# Patient Record
Sex: Female | Born: 1950 | Race: Black or African American | Hispanic: No | Marital: Single | Smoking: Former smoker
Health system: Southern US, Community
[De-identification: ages and names within clinical notes are randomized; demographics above are authoritative.]

## PROBLEM LIST (undated history)

## (undated) DIAGNOSIS — I10 Essential (primary) hypertension: Secondary | ICD-10-CM

## (undated) DIAGNOSIS — J9621 Acute and chronic respiratory failure with hypoxia: Secondary | ICD-10-CM

## (undated) DIAGNOSIS — J690 Pneumonitis due to inhalation of food and vomit: Secondary | ICD-10-CM

## (undated) DIAGNOSIS — G931 Anoxic brain damage, not elsewhere classified: Secondary | ICD-10-CM

## (undated) DIAGNOSIS — I639 Cerebral infarction, unspecified: Secondary | ICD-10-CM

## (undated) DIAGNOSIS — G40301 Generalized idiopathic epilepsy and epileptic syndromes, not intractable, with status epilepticus: Secondary | ICD-10-CM

## (undated) DIAGNOSIS — E7800 Pure hypercholesterolemia, unspecified: Secondary | ICD-10-CM

## (undated) DIAGNOSIS — G40911 Epilepsy, unspecified, intractable, with status epilepticus: Secondary | ICD-10-CM

---

## 2013-09-28 ENCOUNTER — Inpatient Hospital Stay (HOSPITAL_COMMUNITY): Payer: BC Managed Care – PPO

## 2013-09-28 ENCOUNTER — Encounter (HOSPITAL_COMMUNITY): Payer: Self-pay | Admitting: Emergency Medicine

## 2013-09-28 ENCOUNTER — Emergency Department (HOSPITAL_COMMUNITY): Payer: BC Managed Care – PPO

## 2013-09-28 ENCOUNTER — Inpatient Hospital Stay (HOSPITAL_COMMUNITY)
Admission: EM | Admit: 2013-09-28 | Discharge: 2013-10-03 | DRG: 062 | Disposition: A | Discharge status: Rehabilitation Facility | Payer: BC Managed Care – PPO | Attending: Neurology | Admitting: Neurology

## 2013-09-28 DIAGNOSIS — I10 Essential (primary) hypertension: Secondary | ICD-10-CM | POA: Diagnosis present

## 2013-09-28 DIAGNOSIS — F17290 Nicotine dependence, other tobacco product, uncomplicated: Secondary | ICD-10-CM | POA: Diagnosis present

## 2013-09-28 DIAGNOSIS — Z79899 Other long term (current) drug therapy: Secondary | ICD-10-CM

## 2013-09-28 DIAGNOSIS — G8190 Hemiplegia, unspecified affecting unspecified side: Secondary | ICD-10-CM | POA: Diagnosis present

## 2013-09-28 DIAGNOSIS — I639 Cerebral infarction, unspecified: Secondary | ICD-10-CM | POA: Diagnosis present

## 2013-09-28 DIAGNOSIS — Z01818 Encounter for other preprocedural examination: Secondary | ICD-10-CM

## 2013-09-28 DIAGNOSIS — B9689 Other specified bacterial agents as the cause of diseases classified elsewhere: Secondary | ICD-10-CM | POA: Diagnosis present

## 2013-09-28 DIAGNOSIS — R471 Dysarthria and anarthria: Secondary | ICD-10-CM | POA: Diagnosis present

## 2013-09-28 DIAGNOSIS — I6350 Cerebral infarction due to unspecified occlusion or stenosis of unspecified cerebral artery: Secondary | ICD-10-CM

## 2013-09-28 DIAGNOSIS — Z7982 Long term (current) use of aspirin: Secondary | ICD-10-CM

## 2013-09-28 DIAGNOSIS — I6340 Cerebral infarction due to embolism of unspecified cerebral artery: Principal | ICD-10-CM | POA: Diagnosis present

## 2013-09-28 DIAGNOSIS — R29810 Facial weakness: Secondary | ICD-10-CM | POA: Diagnosis present

## 2013-09-28 DIAGNOSIS — N390 Urinary tract infection, site not specified: Secondary | ICD-10-CM | POA: Diagnosis present

## 2013-09-28 DIAGNOSIS — E785 Hyperlipidemia, unspecified: Secondary | ICD-10-CM | POA: Diagnosis present

## 2013-09-28 DIAGNOSIS — G8194 Hemiplegia, unspecified affecting left nondominant side: Secondary | ICD-10-CM | POA: Diagnosis present

## 2013-09-28 DIAGNOSIS — F17200 Nicotine dependence, unspecified, uncomplicated: Secondary | ICD-10-CM | POA: Diagnosis present

## 2013-09-28 LAB — URINALYSIS, ROUTINE W REFLEX MICROSCOPIC
Bilirubin Urine: NEGATIVE
Glucose, UA: NEGATIVE mg/dL
Ketones, ur: NEGATIVE mg/dL
Nitrite: POSITIVE — AB
Specific Gravity, Urine: 1.016 (ref 1.005–1.030)
pH: 7 (ref 5.0–8.0)

## 2013-09-28 LAB — COMPREHENSIVE METABOLIC PANEL
ALT: 9 U/L (ref 0–35)
AST: 18 U/L (ref 0–37)
Alkaline Phosphatase: 90 U/L (ref 39–117)
BUN: 12 mg/dL (ref 6–23)
CO2: 26 mEq/L (ref 19–32)
Calcium: 9.9 mg/dL (ref 8.4–10.5)
Chloride: 97 mEq/L (ref 96–112)
GFR calc Af Amer: >90 mL/min (ref >90)
GFR calc non Af Amer: >90 mL/min (ref >90)
Glucose, Bld: 124 mg/dL — ABNORMAL HIGH (ref 70–99)
Potassium: 4.3 mEq/L (ref 3.5–5.1)
Sodium: 137 mEq/L (ref 135–145)
Total Bilirubin: 0.2 mg/dL — ABNORMAL LOW (ref 0.3–1.2)
Total Protein: 8 g/dL (ref 6.0–8.3)

## 2013-09-28 LAB — MRSA PCR SCREENING: MRSA by PCR: NEGATIVE

## 2013-09-28 LAB — DIFFERENTIAL
Eosinophils Absolute: 0 10*3/uL (ref 0.0–0.7)
Eosinophils Relative: 1 % (ref 0–5)
Lymphocytes Relative: 28 % (ref 12–46)
Lymphs Abs: 1.6 10*3/uL (ref 0.7–4.0)
Monocytes Relative: 8 % (ref 3–12)
Neutro Abs: 3.8 10*3/uL (ref 1.7–7.7)
Neutrophils Relative %: 64 % (ref 43–77)

## 2013-09-28 LAB — CBC
Hemoglobin: 14.5 g/dL (ref 12.0–15.0)
MCH: 30.1 pg (ref 26.0–34.0)
MCV: 89.4 fL (ref 78.0–100.0)
Platelets: 207 10*3/uL (ref 150–400)
RBC: 4.82 MIL/uL (ref 3.87–5.11)
WBC: 6 10*3/uL (ref 4.0–10.5)

## 2013-09-28 LAB — POCT I-STAT TROPONIN I

## 2013-09-28 LAB — POCT I-STAT, CHEM 8
BUN: 13 mg/dL (ref 6–23)
Calcium, Ion: 1.23 mmol/L (ref 1.13–1.30)
Creatinine, Ser: 1 mg/dL (ref 0.50–1.10)
Hemoglobin: 14.6 g/dL (ref 12.0–15.0)
Sodium: 141 mEq/L (ref 135–145)
TCO2: 29 mmol/L (ref 0–100)

## 2013-09-28 LAB — PROTIME-INR: INR: 0.95 (ref 0.00–1.49)

## 2013-09-28 LAB — RAPID URINE DRUG SCREEN, HOSP PERFORMED
Barbiturates: NOT DETECTED
Cocaine: NOT DETECTED

## 2013-09-28 LAB — URINE MICROSCOPIC-ADD ON

## 2013-09-28 LAB — TROPONIN I: Troponin I: <0.3 ng/mL (ref <0.30)

## 2013-09-28 LAB — ETHANOL: Alcohol, Ethyl (B): <11 mg/dL (ref 0–11)

## 2013-09-28 MED ORDER — ALTEPLASE (STROKE) FULL DOSE INFUSION
52.0000 mg | Freq: Once | INTRAVENOUS | Status: AC
  Administered 2013-09-28: 52 mg via INTRAVENOUS
  Filled 2013-09-28: qty 52

## 2013-09-28 MED ORDER — LABETALOL HCL 5 MG/ML IV SOLN
INTRAVENOUS | Status: AC
  Administered 2013-09-28: 10 mg
  Filled 2013-09-28: qty 4

## 2013-09-28 MED ORDER — ACETAMINOPHEN 325 MG PO TABS: 650.0000 mg | ORAL_TABLET | ORAL | Status: DC | PRN

## 2013-09-28 MED ORDER — LABETALOL HCL 5 MG/ML IV SOLN
10.0000 mg | INTRAVENOUS | Status: DC | PRN
  Administered 2013-09-28 – 2013-09-29 (×8): 10 mg via INTRAVENOUS
  Filled 2013-09-28 (×5): qty 4

## 2013-09-28 MED ORDER — SODIUM CHLORIDE 0.9 % IV SOLN
250.0000 mL | Freq: Once | INTRAVENOUS | Status: AC
  Administered 2013-09-28: 250 mL via INTRAVENOUS

## 2013-09-28 MED ORDER — LORAZEPAM 2 MG/ML IJ SOLN
INTRAMUSCULAR | Status: AC
  Filled 2013-09-28: qty 1

## 2013-09-28 MED ORDER — SODIUM CHLORIDE 0.9 % IV SOLN
INTRAVENOUS | Status: DC
  Administered 2013-09-28: 19:00:00 via INTRAVENOUS

## 2013-09-28 MED ORDER — PANTOPRAZOLE SODIUM 40 MG IV SOLR
40.0000 mg | Freq: Every day | INTRAVENOUS | Status: DC
  Administered 2013-09-28: 40 mg via INTRAVENOUS
  Filled 2013-09-28 (×2): qty 40

## 2013-09-28 MED ORDER — SULFAMETHOXAZOLE-TMP DS 800-160 MG PO TABS
1.0000 | ORAL_TABLET | Freq: Two times a day (BID) | ORAL | Status: DC
  Administered 2013-09-28 – 2013-10-03 (×10): 1 via ORAL
  Filled 2013-09-28 (×11): qty 1

## 2013-09-28 MED ORDER — ACETAMINOPHEN 650 MG RE SUPP: 650.0000 mg | RECTAL | Status: DC | PRN

## 2013-09-28 NOTE — Progress Notes (Signed)
Dr Roseanne Reno made aware of change in NIH from 0 to 5 from L sided weakness, ataxia and L facial droop. Dr Roseanne Reno stated based on neuro fluctuations early she does not warrant scan. If any change in mental status please notify and will need a stat head CT. Will continue to monitor.

## 2013-09-28 NOTE — H&P (Addendum)
Neurology H&P Reason for Consult: Stroke  CC: Left-sided weakness  History is obtained from: Patient  HPI: Erika Wang is a 62 y.o. female with a history of no known medical problems though she does not see a physician on a regular basis. She was in her normal state until today at 11 AM at which point she had sudden onset of left-sided weakness. Therefore 911 was called. On arrival here, she was seen to have a left hemi-plegia with NIH of 9, taken to CT following which she had some improvement and NIH of approximately 4, but still had significant left leg weakness and therefore t-PA was initiated.  Following administration of TPA, the patient had acute worsening of her symptoms and the 2 IVs that she had were both 2 anterior to perform a CT angiogram therefore she was taken for emergent MRI/MRA which did not demonstrate any intervenable lesion.   She thankfully had subsequent significant improvement without any hemiparesis.   LKW: 11am tpa given?: yes NIHSS: 9 on arrival, fluctuated widely  ROS: A 14 point ROS was performed and is negative except as noted in the HPI.  History reviewed. No pertinent past medical history.  Family History: Unknown to patient  Social History: Tob: smoker  Exam: Current vital signs: BP 159/79  Pulse 73  Temp(Src) 97.6 F (36.4 C) (Oral)  Resp 14  Wt 52.164 kg (115 lb)  SpO2 99% Vital signs in last 24 hours: Temp:  [97.6 F (36.4 C)] 97.6 F (36.4 C) (11/30 1242) Pulse Rate:  [64-84] 73 (11/30 1517) Resp:  [12-18] 14 (11/30 1517) BP: (146-191)/(79-125) 159/79 mmHg (11/30 1533) SpO2:  [96 %-100 %] 99 % (11/30 1517) Weight:  [52.164 kg (115 lb)] 52.164 kg (115 lb) (11/30 1100)  General: in bed, NAD CV: RRR Mental Status: Patient is awake, alert, oriented to person, place, month, year, and situation. Immediate and remote memory are intact. Patient is able to give a clear and coherent history. No signs of aphasia or neglect Cranial  Nerves: II: Visual Fields are full. Pupils are equal, round, and reactive to light.  Discs are difficult to visualize. III,IV, VI: EOMI without ptosis or diploplia.  V: Facial sensation is symmetric to temperature VII: Facial movement is weak on left VIII: hearing is intact to voice X: Uvula elevates symmetrically XI: Shoulder shrug is symmetric. XII: tongue is midline without atrophy or fasciculations.  Motor: Tone is markedly increased on the left. Bulk is normal. 5/5 strength was present on the right, minimal movement of the left arm and leg Sensory: Sensation is symmetric to light touch and temperature in the arms and legs. Deep Tendon Reflexes: 2+  in the biceps and patellae on the right, difficult to elicit on the left due to increased tone Cerebellar: FNF and HKS are intact on the right, unable to obtain on the left Gait: Not tested due to weakness.    I have reviewed labs in epic and the results pertinent to this consultation are: UA looks like a urinary tract infection CMP-unremarkable CBC-unremarkable  I have reviewed the images obtained: MRI-basal ganglia infarct, MRA-no acute intervenable lesion  Impression: 62 year old female with large basal ganglia infarct and accelerated hypertension with stuttering symptoms. She will be admitted to the intensive care unit for frequent neuro checks.   Recommendations: 1. HgbA1c, fasting lipid panel 2. MRI, MRA  of the brain without contrast 3. Frequent neuro checks 4. Echocardiogram 5. Carotid dopplers 6. Prophylactic therapy-None 7. Risk factor modification 8. Telemetry monitoring 9.  PT consult, OT consult, Speech consult 10. Bactrim for UTI 11. Labetalol for blood pressure greater than 185/100   This patient is critically ill and at significant risk of neurological worsening, death and care requires constant monitoring of vital signs, hemodynamics,respiratory and cardiac monitoring, neurological assessment, discussion  with family, other specialists and medical decision making of high complexity. I spent 60 minutes of neurocritical care time  in the care of  this patient.  Ritta Slot, MD [facility] [phone]  If 7pm- 7am, please page neurology on call at [phone].  09/28/2013  3:47 PM

## 2013-09-28 NOTE — ED Notes (Signed)
Patient returned from MRI with this RN. Unable to obtain BP in MRI due to patient not lying still and hematoma at right [facility] from IV stick before TPA was given.

## 2013-09-28 NOTE — ED Notes (Signed)
Patient to MRI.

## 2013-09-28 NOTE — ED Notes (Signed)
RN crystal notified that patient would need a new PIV for CTA.  Existing IV in rt wrist

## 2013-09-28 NOTE — ED Notes (Signed)
cbg 111 

## 2013-09-28 NOTE — ED Notes (Addendum)
Patient presents to ED via EMS with left sided weakness that started at 1100 today. When patient arrived to ED left sided weakness and left sided facial droop noted. TPA given at 1249 and symptoms started improving with TPA bolus and infusion.

## 2013-09-28 NOTE — ED Notes (Signed)
Hematoma to left hand. Pressure applied.

## 2013-09-28 NOTE — ED Notes (Signed)
Speech more slurred, pt unable to move left side. Dr Anise Salvo notified.

## 2013-09-28 NOTE — ED Provider Notes (Signed)
CSN: [id number]     Arrival date & time 09/28/13  1220 History   First MD Initiated Contact with Patient 09/28/13 1221     Chief Complaint  Patient presents with  . Code Stroke   (Consider location/radiation/quality/duration/timing/severity/associated sxs/prior Treatment) HPI Patient presents onset of left-sided hemiplegia, speech difficulty.  Symptoms began approximately 1.5 hours prior to my evaluation.  Patient presented as a code stroke.  The patient has difficulty explaining history of present illness secondary to speech deficits.  This is a level V caveat. Per EMS the patient was hypertensive en route, but in no distress.  She had pronounced left-sided hemiplegia, left facial droop. The patient denies pain, is oriented x3, follows commands, but cannot enunciate a description of her current clinical condition.   History reviewed. No pertinent past medical history. Past Surgical History  Procedure Laterality Date  . Cesarean section     History reviewed. No pertinent family history. History  Substance Use Topics  . Smoking status: Current Every Day Smoker  . Smokeless tobacco: Not on file  . Alcohol Use: Not on file   OB History   Grav Para Term Preterm Abortions TAB SAB Ect Mult Living                 Review of Systems  Unable to perform ROS: Acuity of condition    Allergies  Review of patient's allergies indicates no known allergies.  Home Medications  No current outpatient prescriptions on file. BP 165/101  Pulse 70  Temp(Src) 97.6 F (36.4 C) (Oral)  Resp 17  Wt 115 lb (52.164 kg)  SpO2 99% Physical Exam  Nursing note and vitals reviewed. Constitutional: She is oriented to person, place, and time.  Thin elderly female  HENT:  Left facial droop.  No other gross deformities.  Eyes: Conjunctivae and EOM are normal. Pupils are equal, round, and reactive to light.  Neck: No tracheal deviation present.  Cardiovascular: Normal rate and regular rhythm.    Pulmonary/Chest: Effort normal. No stridor. No respiratory distress.  Abdominal: Soft. She exhibits no distension.  Musculoskeletal:  No gross deformities.,  Left elbow and wrist held in contraction  Neurological: She is alert and oriented to person, place, and time. A cranial nerve deficit is present. She exhibits abnormal muscle tone. Coordination abnormal.  Patient follows commands inconsistently, with left hemiplegia, decreased strength in upper and lower extremity.  Patient's sensation seems appropriate and for all extremities. Per EMS this seems an improvement from her condition on transport. NIH stroke scale approximately 7, though with her waxing and weaning symptoms  Skin: Skin is warm and dry.  Psychiatric: She has a normal mood and affect.    ED Course  Procedures (including critical care time) Labs Review Labs Reviewed  POCT I-STAT, CHEM 8 - Abnormal; Notable for the following:    Glucose, Bld 128 (*)    All other components within normal limits  PROTIME-INR  APTT  CBC  DIFFERENTIAL  ETHANOL  COMPREHENSIVE METABOLIC PANEL  TROPONIN I  URINE RAPID DRUG SCREEN (HOSP PERFORMED)  URINALYSIS, ROUTINE W REFLEX MICROSCOPIC  POCT I-STAT TROPONIN I   Imaging Review Ct Head Wo Contrast  09/28/2013   CLINICAL DATA:  Code stroke, left-sided weakness, slurred speech  EXAM: CT HEAD WITHOUT CONTRAST  TECHNIQUE: Contiguous axial images were obtained from the base of the skull through the vertex without intravenous contrast.  COMPARISON:  None.  FINDINGS: No evidence of parenchymal hemorrhage or extra-axial fluid collection. No mass lesion, mass  effect, or midline shift.  No CT evidence of acute infarction.  Subcortical hypodensity in the posterior right frontal lobe (series 2/images 15 is 16), compatible with nonspecific white matter small vessel ischemic changes.  Cerebral volume is within normal limits.  No ventriculomegaly.  The visualized paranasal sinuses are essentially clear.  The mastoid air cells are unopacified.  No evidence of calvarial fracture.  IMPRESSION: No evidence of acute intracranial abnormality.  Nonspecific small vessel ischemic changes in the subcortical right frontal lobe.  These results were called by telephone at the time of interpretation on 09/28/2013 at 12:42 PM to Dr. Amada Jupiter, who verbally acknowledged these results.   Electronically Signed   By: Charline Bills M.D.   On: 09/28/2013 12:43    EKG Interpretation   None      Patient's initial evaluation was conducted with our neurology colleagues.  After the initial eval the patient was deemed a candidate for tPa, given her ongoing Sx, and the absence of RF.  Update: Following initiation of TPA the patient improvement   1:30 PM The patient's symptoms are now more pronounced.  On repeat exam she is having a difficult articulating any words, left arm contracture is pronounced, and she does not follow commands is reliable as she was immediately following initiation of TPA.   2:54 PM Patient has improved substantially again. I have reviewed the MRI findings with our neurologist.  There is a basal ganglia infarct the MDM   1. Stroke    This patient presents after the acute onset of left hemiplegia, dysarthria.  On initial exam the patient has mild improvement, but during her emergency department course she had waxing and waning symptoms.  She had radiographic evidence of acute basal ganglia stroke. Soon after the initial evaluation the patient received tPa.  This medication seemed to have been well tolerated, and again, the patient's symptoms waxed, weaned.  With ongoing therapy, the patient required admission to the neurologic ICU for further evaluation and management.  CRITICAL CARE Performed by: Gerhard Munch Total critical care time: 50 Critical care time was exclusive of separately billable procedures and treating other patients. Critical care was necessary to treat or prevent  imminent or life-threatening deterioration. Critical care was time spent personally by me on the following activities: development of treatment plan with patient and/or surrogate as well as nursing, discussions with consultants, evaluation of patient's response to treatment, examination of patient, obtaining history from patient or surrogate, ordering and performing treatments and interventions, ordering and review of laboratory studies, ordering and review of radiographic studies, pulse oximetry and re-evaluation of patient's condition.     Gerhard Munch, MD 09/28/13 2000

## 2013-09-29 ENCOUNTER — Inpatient Hospital Stay (HOSPITAL_COMMUNITY): Payer: BC Managed Care – PPO

## 2013-09-29 DIAGNOSIS — I369 Nonrheumatic tricuspid valve disorder, unspecified: Secondary | ICD-10-CM

## 2013-09-29 DIAGNOSIS — I6350 Cerebral infarction due to unspecified occlusion or stenosis of unspecified cerebral artery: Secondary | ICD-10-CM

## 2013-09-29 LAB — COMPREHENSIVE METABOLIC PANEL
ALT: 7 U/L (ref 0–35)
Alkaline Phosphatase: 77 U/L (ref 39–117)
BUN: 11 mg/dL (ref 6–23)
CO2: 22 mEq/L (ref 19–32)
Calcium: 9 mg/dL (ref 8.4–10.5)
Chloride: 102 mEq/L (ref 96–112)
Creatinine, Ser: 0.78 mg/dL (ref 0.50–1.10)
GFR calc Af Amer: >90 mL/min (ref >90)
GFR calc non Af Amer: 89 mL/min — ABNORMAL LOW (ref >90)
Glucose, Bld: 133 mg/dL — ABNORMAL HIGH (ref 70–99)
Potassium: 4.2 mEq/L (ref 3.5–5.1)
Sodium: 138 mEq/L (ref 135–145)
Total Bilirubin: 0.3 mg/dL (ref 0.3–1.2)
Total Protein: 6.7 g/dL (ref 6.0–8.3)

## 2013-09-29 LAB — LIPID PANEL
HDL: 51 mg/dL (ref >39)
Total CHOL/HDL Ratio: 3.5 RATIO
Triglycerides: 130 mg/dL (ref <150)
VLDL: 26 mg/dL (ref 0–40)

## 2013-09-29 LAB — CBC
HCT: 39.2 % (ref 36.0–46.0)
Hemoglobin: 13 g/dL (ref 12.0–15.0)
MCHC: 33.2 g/dL (ref 30.0–36.0)
MCV: 89.5 fL (ref 78.0–100.0)
Platelets: 196 10*3/uL (ref 150–400)
RDW: 15.1 % (ref 11.5–15.5)

## 2013-09-29 LAB — HEMOGLOBIN A1C: Mean Plasma Glucose: 123 mg/dL — ABNORMAL HIGH (ref <117)

## 2013-09-29 LAB — CK TOTAL AND CKMB (NOT AT ARMC): Relative Index: 2 (ref 0.0–2.5)

## 2013-09-29 MED ORDER — LISINOPRIL 10 MG PO TABS
10.0000 mg | ORAL_TABLET | Freq: Every day | ORAL | Status: DC
  Administered 2013-09-29 – 2013-10-03 (×5): 10 mg via ORAL
  Filled 2013-09-29 (×5): qty 1

## 2013-09-29 MED ORDER — PANTOPRAZOLE SODIUM 40 MG PO TBEC
40.0000 mg | DELAYED_RELEASE_TABLET | Freq: Every day | ORAL | Status: DC
  Administered 2013-09-29 – 2013-10-03 (×5): 40 mg via ORAL
  Filled 2013-09-29 (×5): qty 1

## 2013-09-29 MED ORDER — ASPIRIN EC 325 MG PO TBEC
325.0000 mg | DELAYED_RELEASE_TABLET | Freq: Every day | ORAL | Status: DC
  Administered 2013-09-29 – 2013-10-03 (×5): 325 mg via ORAL
  Filled 2013-09-29 (×5): qty 1

## 2013-09-29 MED ORDER — ONDANSETRON HCL 4 MG/2ML IJ SOLN
4.0000 mg | Freq: Four times a day (QID) | INTRAMUSCULAR | Status: DC | PRN
  Administered 2013-09-29: 4 mg via INTRAVENOUS
  Filled 2013-09-29: qty 2

## 2013-09-29 NOTE — Progress Notes (Signed)
SLP Cancellation Note  Patient Details Name: Erika Wang MRN: [id number] DOB: [birth year]   Cancelled treatment:       Reason Eval/Treat Not Completed: Fatigue/lethargy limiting ability to participate.  Attempted to administer MoCA, however, pt unable to maintain alertness.  Results would not be valid.  ST to continue efforts.  Celia B. [place], [facility], CCC-SLP [phone]  [name] 09/29/2013, 1:34 PM

## 2013-09-29 NOTE — Progress Notes (Signed)
*  PRELIMINARY RESULTS* Vascular Ultrasound Carotid Duplex (Doppler) has been completed.   Study was technically difficult due to respiratory interference. Findings suggest 1-39% internal carotid artery stenosis bilaterally. Vertebral arteries are patent with antegrade flow.  09/29/2013 5:28 PM Gertie Fey, RVT, RDCS, RDMS

## 2013-09-29 NOTE — Evaluation (Signed)
Clinical/Bedside Swallow Evaluation Patient Details  Name: Erika Wang MRN: [id number] Date of Birth: Sep 24, 1951  Today's Date: 09/29/2013 Time: 1310-1330 SLP Time Calculation (min): 20 min  Past Medical History: History reviewed. No pertinent past medical history. Past Surgical History:  Past Surgical History  Procedure Laterality Date  . Cesarean section     HPI:  62 year old female admitted 09/28/13 due to left sided weakness.  Pt given t-PA in ED. MRI revealed large Basal Ganglia Infarct, CXR indicated clear lungs.   Assessment / Plan / Recommendation Clinical Impression  Pt had lunch tray still in room, poor intake noted.  Given significance of left sided weakness, and location of infarct, SLP decided to proceed with BSE.  At this time, pt exhibits no overt s/s aspiration with any consistency tested.  Will change diet to mechanical soft with chopped meats (mainly due to left UE weakness), and continue thin liquids.  Pt did not exhibit pocketing of lunch items, however, pt is at risk, so attention should be paid to oral cavity after po intake.  ST to monitor diet tolerance.    Aspiration Risk  Moderate    Diet Recommendation Dysphagia 3 (Mechanical Soft);Thin liquid (chop meats)   Liquid Administration via: Straw;Cup Medication Administration: Whole meds with puree (1 @ a time) Supervision: Staff to assist with self feeding;Full supervision/cueing for compensatory strategies Compensations: Slow rate;Small sips/bites;Follow solids with liquid;Check for pocketing;Check for anterior loss Postural Changes and/or Swallow Maneuvers: Seated upright 90 degrees;Upright 30-60 min after meal    Other  Recommendations Oral Care Recommendations: Oral care before and after PO Other Recommendations: Have oral suction available;Clarify dietary restrictions   Follow Up Recommendations  Inpatient Rehab    Frequency and Duration min 2x/week  2 weeks   Pertinent Vitals/Pain VSS    SLP  Swallow Goals  Please refer to care plan   Swallow Study Prior Functional Status  Education: 12th grade education per pt.    General Date of Onset: 09/28/13 HPI: 62 year old female admitted 09/28/13 due to left sided weakness.  Pt given t-PA in ED. MRI revealed large Basal Ganglia Infarct, CXR indicated clear lungs. Type of Study: Bedside swallow evaluation Diet Prior to this Study: Regular;Thin liquids Temperature Spikes Noted: No Respiratory Status: Room air History of Recent Intubation: No Behavior/Cognition: Lethargic;Requires cueing;Decreased sustained attention Oral Cavity - Dentition: Missing dentition;Poor condition Self-Feeding Abilities: Needs assist;Needs set up Patient Positioning: Upright in bed Baseline Vocal Quality: Clear Volitional Cough: Cognitively unable to elicit Volitional Swallow: Unable to elicit    Oral/Motor/Sensory Function Overall Oral Motor/Sensory Function: Impaired Labial ROM: Reduced left Labial Symmetry: Abnormal symmetry left Labial Strength: Reduced Lingual ROM: Reduced left Lingual Strength: Reduced Facial ROM: Reduced left Facial Symmetry: Left droop Facial Strength: Reduced Mandible: Within Functional Limits   Ice Chips Ice chips: Not tested   Thin Liquid Thin Liquid: Within functional limits Presentation: Straw    Nectar Thick Nectar Thick Liquid: Not tested   Honey Thick Honey Thick Liquid: Not tested   Puree Puree: Within functional limits Presentation: Spoon   Solid   GO    Solid: Within functional limits Presentation: Spoon Other Comments: cooked green beans      [name], MSP, CCC-SLP [phone]  [name] 09/29/2013,1:44 PM

## 2013-09-29 NOTE — Progress Notes (Signed)
Stroke Team Progress Note  HISTORY Erika Wang is a 62 y.o. female with a history of no known medical problems though she does not see a physician on a regular basis. She was in her normal state until today at 11 AM at which point she had sudden onset of left-sided weakness. Therefore 911 was called. On arrival here, she was seen to have a left hemi-plegia with NIH of 9, taken to CT following which she had some improvement and NIH of approximately 4, but still had significant left leg weakness and therefore t-PA was initiated.   Following administration of TPA, the patient had acute worsening of her symptoms and the 2 IVs that she had were both 2 anterior to perform a CT angiogram therefore she was taken for emergent MRI/MRA which did not demonstrate any intervenable lesion.   She thankfully had subsequent significant improvement without any hemiparesis.   LKW: 11am  tpa given?: yes  NIHSS: 9 on arrival, fluctuated widely    She was admitted to the neuro ICU for further evaluation and treatment.  SUBJECTIVE She is sitting up in bed.  Overall she feels her condition is gradually improving.    OBJECTIVE Most recent Vital Signs: Filed Vitals:   09/29/13 0417 09/29/13 0500 09/29/13 0600 09/29/13 0700  BP:  153/65 150/57 154/71  Pulse:  72 64 74  Temp: 98.2 F (36.8 C)     TempSrc: Oral     Resp:  13 13 14   Height:      Weight:      SpO2:  98% 99% 98%   CBG (last 3)  No results found for this basename: GLUCAP,  in the last 72 hours  IV Fluid Intake:   . sodium chloride 75 mL/hr at 09/29/13 0700    MEDICATIONS  . pantoprazole (PROTONIX) IV  40 mg Intravenous QHS  . sulfamethoxazole-trimethoprim  1 tablet Oral Q12H   PRN:  acetaminophen, acetaminophen, labetalol  Diet:  Cardiac thin liquids Activity:  Bedrest DVT Prophylaxis:  SCD  CLINICALLY SIGNIFICANT STUDIES Basic Metabolic Panel:  Recent Labs Lab 09/28/13 1237 09/28/13 1249  NA 137 141  K 4.3 4.1  CL 97 101   CO2 26  --   GLUCOSE 124* 128*  BUN 12 13  CREATININE 0.72 1.00  CALCIUM 9.9  --    Liver Function Tests:  Recent Labs Lab 09/28/13 1237  AST 18  ALT 9  ALKPHOS 90  BILITOT 0.2*  PROT 8.0  ALBUMIN 3.6   CBC:  Recent Labs Lab 09/28/13 1237 09/28/13 1249  WBC 6.0  --   NEUTROABS 3.8  --   HGB 14.5 14.6  HCT 43.1 43.0  MCV 89.4  --   PLT 207  --    Coagulation:  Recent Labs Lab 09/28/13 1237  LABPROT 12.5  INR 0.95   Cardiac Enzymes:  Recent Labs Lab 09/28/13 1237  TROPONINI <0.30   Urinalysis:  Recent Labs Lab 09/28/13 1449  COLORURINE YELLOW  LABSPEC 1.016  PHURINE 7.0  GLUCOSEU NEGATIVE  HGBUR MODERATE*  BILIRUBINUR NEGATIVE  KETONESUR NEGATIVE  PROTEINUR 100*  UROBILINOGEN 0.2  NITRITE POSITIVE*  LEUKOCYTESUR LARGE*   Lipid Panel    Component Value Date/Time   CHOL 176 09/29/2013 0415   TRIG 130 09/29/2013 0415   HDL 51 09/29/2013 0415   CHOLHDL 3.5 09/29/2013 0415   VLDL 26 09/29/2013 0415   LDLCALC 99 09/29/2013 0415   HgbA1C  No results found for this basename: HGBA1C  Urine Drug Screen:     Component Value Date/Time   LABOPIA NONE DETECTED 09/28/2013 1449   COCAINSCRNUR NONE DETECTED 09/28/2013 1449   LABBENZ NONE DETECTED 09/28/2013 1449   AMPHETMU NONE DETECTED 09/28/2013 1449   THCU NONE DETECTED 09/28/2013 1449   LABBARB NONE DETECTED 09/28/2013 1449    Alcohol Level:  Recent Labs Lab 09/28/13 1237  ETH <11    Ct Head Wo Contrast 09/28/2013   No evidence of acute intracranial abnormality.  Nonspecific small vessel ischemic changes in the subcortical right frontal lobe.   Dg Chest Port 1 View 09/28/2013   Cardiomegaly without decompensation    Mr Brain Ltd W/o Cm 09/28/2013   Axial diffusion-weighted imaging demonstrating confluent restricted diffusion affecting a 3.5 cm area of the right basal ganglia. Minimal white matter involvement adjacent to the right caudate nucleus.    Mr Maxine Glenn Head/brain Wo Cm 09/28/2013    1. Anterior circulation is negative except for mild irregularity compatible with atherosclerosis. No right M1 or A1 occlusion or high-grade stenosis. 2. Confluent acute right basal ganglia infarct. 3. 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   2D Echocardiogram    Carotid Doppler    EKG  normal sinus rhythm.   Therapy Recommendations   Physical Exam   Patient is awake, alert, oriented to person, place, month, year, and situation.  Immediate and remote memory are intact.  Patient is able to give a clear and coherent history.  No signs of aphasia or neglect  Cranial Nerves:  II: Visual Fields are full. Pupils are equal, round, and reactive to light. Discs are difficult to visualize.  III,IV, VI: EOMI without ptosis or diploplia.  V: Facial sensation is symmetric to temperature  VII: Facial movement is weak on left  VIII: hearing is intact to voice  X: Uvula elevates symmetrically  XI: Shoulder shrug is symmetric.  XII: tongue is midline without atrophy or fasciculations.  Motor:  Tone is markedly increased on the left. Bulk is normal. 5/5 strength was present on the right, minimal movement of the left arm and leg  Sensory:  Sensation is symmetric to light touch and temperature in the arms and legs.  Deep Tendon Reflexes:  2+ in the biceps and patellae on the right, difficult to elicit on the left due to increased tone  Cerebellar:  FNF and HKS are intact on the right, unable to obtain on the left  Gait:  Not tested due to weakness   ASSESSMENT [name] is a 62 y.o. female presenting with left hemiparesis. Status post IV t-PA 09/28/2013 at 1220. Imaging confirms a right basal ganglia infarct. Infarct felt to be thrombotic secondary to small vessel disease.  On no antithrombotics prior to admission. Now on no antithrombotics for secondary stroke prevention (patient received tPA). Patient with resultant left hemiparesis. Work up underway.   LDL  99, at goal, no statin indicated.  Abnormal urine, culture pending, on antibiotics.  Smoker, cessation counseling  Hospital day # 1  TREATMENT/PLAN   Hold antithrombotics until follow up tPA scanning- for secondary stroke prevention.  Repeat CT scan this am. If no hemorrhage, start aspirin 325mg  daily.  Risk factor modification  Therapy evaluations, probable CIR candidate.  HGB A1C, carotid, echo pending  Will transfer out of unit if ct head stable. This patient is critically ill and at significant risk of neurological worsening, death and care requires constant monitoring of vital signs, hemodynamics,respiratory and cardiac monitoring,review of multiple databases, neurological assessment, discussion with family,  other specialists and medical decision making of high complexity. I spent 30 minutes of neurocritical care time  in the care of  this patient. I have personally obtained a history, examined the patient, evaluated imaging results, and formulated the assessment and plan of care. I agree with the above. Delia Heady, MD

## 2013-09-29 NOTE — Evaluation (Signed)
Physical Therapy Evaluation Patient Details Name: Erika Wang MRN: [id number] DOB: 1951-10-13 Today's Date: 09/29/2013 Time: 0900-0930 PT Time Calculation (min): 30 min  PT Assessment / Plan / Recommendation History of Present Illness  Erika Wang is a 62 y.o. female with a history of no known medical problems though she does not see a physician on a regular basis. She was in her normal state until today at 11 AM at which point she had sudden onset of left-sided weakness. Therefore 911 was called. On arrival here, she was seen to have a left hemi-plegia with NIH of 9, taken to CT following which she had some improvement and NIH of approximately 4, but still had significant left leg weakness and therefore t-PA was initiated.  Clinical Impression  Pt independent and working PTA now presenting with L hemiparesis and requiring maxA for all transfers at this time. Pt motivated and desires to return home and to work. Pt excellent candidate for CIR upon d/c from hospital to achieve safe mod I function for safe d/c home. Pt does have daughter she can potentially stay with if needed.    PT Assessment  Patient needs continued PT services    Follow Up Recommendations  CIR    Does the patient have the potential to tolerate intense rehabilitation      Barriers to Discharge Decreased caregiver support pt lives alone    Equipment Recommendations   (TBD)    Recommendations for Other Services Rehab consult   Frequency Min 4X/week    Precautions / Restrictions Precautions Precautions: Fall Restrictions Weight Bearing Restrictions: No   Pertinent Vitals/Pain Denies pain      Mobility  Bed Mobility Bed Mobility: Supine to Sit Supine to Sit: HOB elevated;With rails;3: Mod assist Sitting - Scoot to Edge of Bed: With rail;3: Mod assist Details for Bed Mobility Assistance: assist to bring hips to EOB Transfers Transfers: Sit to Stand;Stand to Sit;Stand Pivot Transfers Sit to Stand: 1: +2  Total assist;With upper extremity assist;From chair/3-in-1 Sit to Stand: Patient Percentage: 60% Stand to Sit: 1: +2 Total assist;With upper extremity assist;To chair/3-in-1 Stand to Sit: Patient Percentage: 60% Stand Pivot Transfers: 1: +2 Total assist Stand Pivot Transfers: Patient Percentage: 60% Details for Transfer Assistance: assist to advance L LE and achieve weight-shift Ambulation/Gait Ambulation/Gait Assistance: 1: +2 Total assist Ambulation/Gait: Patient Percentage: 60% Ambulation Distance (Feet):  (5 steps to chair) Assistive device: 2 person hand held assist Ambulation/Gait Assistance Details: assist for L LE advancement and provided blocking to L knee to prevent buckling, Gait Pattern: Step-to pattern;Decreased step length - left;Decreased stance time - left Gait velocity: slow Stairs: No Modified Rankin (Stroke Patients Only) Pre-Morbid Rankin Score: No symptoms Modified Rankin: Moderately severe disability    Exercises General Exercises - Lower Extremity Ankle Circles/Pumps: AROM;Left;5 reps;Seated Long Arc Quad: AROM;Left;10 reps;Seated (5 sec hold) Heel Slides: AROM;Left;10 reps;Supine   PT Diagnosis: Difficulty walking;Generalized weakness;Hemiplegia dominant side  PT Problem List: Decreased strength;Decreased activity tolerance;Decreased balance;Decreased mobility PT Treatment Interventions:       PT Goals(Current goals can be found in the care plan section) Acute Rehab PT Goals Patient Stated Goal: return home to pay bills PT Goal Formulation: With patient Time For Goal Achievement: 10/13/13 Potential to Achieve Goals: Good  Visit Information  Last PT Received On: 09/29/13 Assistance Needed: +2 History of Present Illness: Erika Wang is a 62 y.o. female with a history of no known medical problems though she does not see a physician on a regular basis. She  was in her normal state until today at 11 AM at which point she had sudden onset of left-sided  weakness. Therefore 911 was called. On arrival here, she was seen to have a left hemi-plegia with NIH of 9, taken to CT following which she had some improvement and NIH of approximately 4, but still had significant left leg weakness and therefore t-PA was initiated.       Prior Functioning  Home Living Family/patient expects to be discharged to:: Inpatient rehab Living Arrangements: Alone Additional Comments: was independent and working at [employer] PTA Prior Function Level of Independence: Independent Comments: reports she can hopefully stay with daughter if she needs to Communication Communication: Expressive difficulties (slurred speech) Dominant Hand: Right    Cognition  Cognition Arousal/Alertness: Awake/alert Behavior During Therapy: WFL for tasks assessed/performed Overall Cognitive Status: Within Functional Limits for tasks assessed    Extremity/Trunk Assessment Upper Extremity Assessment Upper Extremity Assessment: LUE deficits/detail LUE Deficits / Details: pt able to initiate all movement however demo's flexor tone and unable to use functionally at this time Lower Extremity Assessment Lower Extremity Assessment: LLE deficits/detail LLE Deficits / Details: grossly 2+/5 Cervical / Trunk Assessment Cervical / Trunk Assessment: Normal   Balance Balance Balance Assessed: Yes Static Sitting Balance Static Sitting - Balance Support: Right upper extremity supported;Feet supported Static Sitting - Level of Assistance: 4: Min assist Static Sitting - Comment/# of Minutes: pt began vommiting upon sitting up EOB, RN notified. pt sat EOB x  End of Session PT - End of Session Equipment Utilized During Treatment: Gait belt Activity Tolerance: Patient tolerated treatment well Patient left: in chair;with call bell/phone within reach Nurse Communication: Mobility status  GP     Marcene Brawn 09/29/2013, 10:22 AM  Lewis Shock, PT, DPT Pager #: [phone] Office  #: [phone]

## 2013-09-29 NOTE — Progress Notes (Signed)
Rehab Admissions Coordinator Note:  Patient was screened by Erika Wang for appropriateness for an Inpatient Acute Rehab Consult.  At this time, we are recommending Inpatient Rehab consult. I will order.   Erika Wang 09/29/2013, 1:20 PM  I can be reached at [phone].

## 2013-09-29 NOTE — Progress Notes (Signed)
Echocardiogram 2D Echocardiogram has been performed.  Dorothey Baseman 09/29/2013, 12:04 PM

## 2013-09-30 MED ORDER — NICOTINE 7 MG/24HR TD PT24
7.0000 mg | MEDICATED_PATCH | Freq: Every day | TRANSDERMAL | Status: DC
  Administered 2013-09-30 – 2013-10-03 (×4): 7 mg via TRANSDERMAL
  Filled 2013-09-30 (×4): qty 1

## 2013-09-30 MED ORDER — ATORVASTATIN CALCIUM 10 MG PO TABS
10.0000 mg | ORAL_TABLET | Freq: Every day | ORAL | Status: DC
  Administered 2013-09-30 – 2013-10-02 (×3): 10 mg via ORAL
  Filled 2013-09-30 (×4): qty 1

## 2013-09-30 NOTE — Progress Notes (Signed)
Occupational Therapy Evaluation Patient Details Name: Erika Wang MRN: [id number] DOB: [birth year] Today's Date: 09/30/2013 Time: 9147-8295 OT Time Calculation (min): 38 min  OT Assessment / Plan / Recommendation History of present illness Erika Wang is a 62 y.o. female with a history of no known medical problems though she does not see a physician on a regular basis. She was in her normal state until today at 11 AM at which point she had sudden onset of left-sided weakness. Therefore 911 was called. On arrival here, she was seen to have a left hemi-plegia with NIH of 9, taken to CT following which she had some improvement and NIH of approximately 4, but still had significant left leg weakness and therefore t-PA was initiated.   Clinical Impression   PTA, pt lived alone in [place], worked at [employer] and was independent with ADL and mobility. 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 Pt presents with L hemiplegia, postural control deficits and apparent cognitive deficits. Pt is an excellent CIR candidate and is extremely motivated to return to PLOF. Daughter present for eval and states that she can assist after D/C if her mother remains in [place]. Pt will benefit from skilled OT services to facilitate D/C to CIR due to below deficits. Will further assess vision.     OT Assessment  Patient needs continued OT Services    Follow Up Recommendations  CIR    Barriers to Discharge      Equipment Recommendations  3 in 1 bedside comode;Tub/shower bench    Recommendations for Other Services Rehab consult  Frequency  Min 3X/week    Precautions / Restrictions Precautions Precautions: Fall Precaution Comments: decreased awareness   Pertinent Vitals/Pain no apparent distress     ADL  Eating/Feeding: Other (comment) (modified diet) Grooming: Moderate assistance Where Assessed - Grooming: Supported  sitting Upper Body Bathing: Minimal assistance Where Assessed - Upper Body Bathing: Supported sitting Lower Body Bathing: Moderate assistance Where Assessed - Lower Body Bathing: Supported sit to stand Upper Body Dressing: Moderate assistance Where Assessed - Upper Body Dressing: Supported sitting Lower Body Dressing: Moderate assistance Where Assessed - Lower Body Dressing: Supported sit to Pharmacist, hospital: Moderate assistance Toilet Transfer Method: Stand pivot Toilet Transfer Equipment: Bedside commode Toileting - Clothing Manipulation and Hygiene: Moderate assistance Where Assessed - Toileting Clothing Manipulation and Hygiene: Sit to stand from 3-in-1 or toilet Equipment Used: Gait belt Transfers/Ambulation Related to ADLs: Mod A with abulation. facilitation for LLE pattern and manual facilitation for weight shift  ADL Comments: donned socks in bed    OT Diagnosis: Generalized weakness;Cognitive deficits;Disturbance of vision;Hemiplegia non-dominant side  OT Problem List: Decreased strength;Decreased range of motion;Decreased activity tolerance;Impaired balance (sitting and/or standing);Impaired vision/perception;Decreased coordination;Decreased safety awareness;Decreased cognition;Decreased knowledge of use of DME or AE;Decreased knowledge of precautions;Impaired sensation;Impaired tone;Impaired UE functional use OT Treatment Interventions: Self-care/ADL training;Therapeutic exercise;Neuromuscular education;DME and/or AE instruction;Therapeutic activities;Patient/family education;Visual/perceptual remediation/compensation;Cognitive remediation/compensation;Balance training   OT Goals(Current goals can be found in the care plan section) Acute Rehab OT Goals Patient Stated Goal: to take care of myself OT Goal Formulation: With patient Time For Goal Achievement: 10/14/13 Potential to Achieve Goals: Good  Visit Information  Last OT Received On: 09/30/13 Assistance Needed:  +1 History of Present Illness: Erika Wang is a 62 y.o. female with a history of no known medical problems though she does not see a physician on a regular basis. She was in her normal  state until today at 11 AM at which point she had sudden onset of left-sided weakness. Therefore 911 was called. On arrival here, she was seen to have a left hemi-plegia with NIH of 9, taken to CT following which she had some improvement and NIH of approximately 4, but still had significant left leg weakness and therefore t-PA was initiated.       Prior Functioning     Home Living Family/patient expects to be discharged to:: Inpatient rehab Available Help at Discharge: Family Additional Comments: was independent and working at [employer] PTA; lives in [facility]  Lives With: Alone Prior Function Level of Independence: Independent Comments: daughter has 65 week old baby Communication Communication: Expressive difficulties Dominant Hand: Right         Vision/Perception Vision - History Baseline Vision: No visual deficits Patient Visual Report: Blurring of vision;Other (comment) (saw stripes of colors) Vision - Assessment Eye Alignment: Within Functional Limits Vision Assessment:  (will further assess) Perception Perception: Impaired Inattention/Neglect: Impaired-to be further tested in functional context (min L inattention)   Cognition  Cognition Arousal/Alertness: Awake/alert Behavior During Therapy: WFL for tasks assessed/performed Overall Cognitive Status: Impaired/Different from baseline Area of Impairment: Attention;Memory;Safety/judgement;Awareness;Problem solving Current Attention Level: Selective Memory: Decreased recall of precautions Safety/Judgement: Decreased awareness of safety;Decreased awareness of deficits Awareness: Emergent Problem Solving: Slow processing;Decreased initiation;Difficulty sequencing;Requires tactile cues    Extremity/Trunk Assessment Upper Extremity  Assessment Upper Extremity Assessment: LUE deficits/detail LUE Deficits / Details: Brunstrom stage Iv arm (movement deviating from synergy);  and IV hand (lat prehension.semi voluntary finger movement) (increased flexor tone) LUE Sensation: decreased light touch LUE Coordination: decreased fine motor;decreased gross motor Lower Extremity Assessment Lower Extremity Assessment: LLE deficits/detail LLE Deficits / Details: motor impersistence noted.  Cervical / Trunk Assessment Cervical / Trunk Assessment: Other exceptions (left bias when distracted) Cervical / Trunk Exceptions: Left bias when distracted. post lean at times     Mobility Bed Mobility Bed Mobility: Supine to Sit;Sitting - Scoot to Edge of Bed Supine to Sit: 4: Min assist;HOB elevated;With rails Sitting - Scoot to Edge of Bed: 4: Min assist;With rail Transfers Transfers: Sit to Stand;Stand to Sit Sit to Stand: 3: Mod assist;From bed Stand to Sit: 3: Mod assist;To chair/3-in-1 Details for Transfer Assistance: assist to advance L LE and achieve weight-shift     Exercise     Balance Balance Balance Assessed: Yes (decreased postural control) Static Sitting Balance Static Sitting - Balance Support: Feet supported;No upper extremity supported Static Sitting - Level of Assistance: 4: Min assist;Other (comment) (post lean at times - related to attention) Static Standing Balance Static Standing - Balance Support: Left upper extremity supported;During functional activity Static Standing - Level of Assistance: 3: Mod assist;Other (comment) (L bias)   End of Session OT - End of Session Equipment Utilized During Treatment: Gait belt Activity Tolerance: Patient tolerated treatment well Patient left: in chair;with call bell/phone within reach;with nursing/sitter in room;with family/visitor present Nurse Communication: Mobility status  GO     Taneal Sonntag,HILLARY 09/30/2013, 1:56 PM [facility], OTR/L  [phone] 09/30/2013

## 2013-09-30 NOTE — Progress Notes (Signed)
Physical Therapy Treatment Patient Details Name: Erika Wang MRN: [id number] DOB: [birth year] Today's Date: 09/30/2013 Time: 1330-1400 PT Time Calculation (min): 30 min  PT Assessment / Plan / Recommendation  History of Present Illness Erika Wang is a 62 y.o. female with a history of no known medical problems though she does not see a physician on a regular basis. She was in her normal state until today at 11 AM at which point she had sudden onset of left-sided weakness. Therefore 911 was called. On arrival here, she was seen to have a left hemi-plegia with NIH of 9, taken to CT following which she had some improvement and NIH of approximately 4, but still had significant left leg weakness and therefore t-PA was initiated.   PT Comments   Pt with improved ambulation tolerance this date as well L UE/LE strength. Pt remains to have L sided neglect and L sided weakness and balance impairment. Pt extremely motivated to return to independence and con't to be an excellent candidate for CIR upon d/c from hospital for maximal functional recovery.   Follow Up Recommendations  CIR     Does the patient have the potential to tolerate intense rehabilitation     Barriers to Discharge        Equipment Recommendations       Recommendations for Other Services Rehab consult  Frequency Min 4X/week   Progress towards PT Goals Progress towards PT goals: Progressing toward goals  Plan Current plan remains appropriate    Precautions / Restrictions Precautions Precautions: Fall Precaution Comments: L sided awareness Restrictions Weight Bearing Restrictions: No   Pertinent Vitals/Pain Denies pain    Mobility  Bed Mobility Bed Mobility: Supine to Sit;Sitting - Scoot to Edge of Bed Supine to Sit: 4: Min assist;HOB elevated;With rails Sitting - Scoot to Edge of Bed: 4: Min assist;With rail Details for Bed Mobility Assistance: assist to bring hips to EOB Transfers Transfers: Sit to Stand;Stand to  Sit Sit to Stand: 3: Mod assist;From bed Stand to Sit: 3: Mod assist;To chair/3-in-1 Details for Transfer Assistance: v/c's for hand placement Ambulation/Gait Ambulation/Gait Assistance: 1: +2 Total assist Ambulation/Gait: Patient Percentage: 70% Ambulation Distance (Feet): 100 Feet (x2) Assistive device: 2 person hand held assist Ambulation/Gait Assistance Details: pt with L sided neglect but no L knee buckling this date. Gait Pattern: Step-through pattern;Decreased stride length;Narrow base of support (occasional cross over) Gait velocity: slow General Gait Details: pushing to R Modified Rankin (Stroke Patients Only) Pre-Morbid Rankin Score: No symptoms Modified Rankin: Moderately severe disability    Exercises Other Exercises Other Exercises: worked on L UE and LE WBing thru standing exercises. pt leaned onto arms of chair with modA to maintain L UE in good alignment and pt brough R knee up onto chairx 15 reps   PT Diagnosis:    PT Problem List:   PT Treatment Interventions:     PT Goals (current goals can now be found in the care plan section) Acute Rehab PT Goals Patient Stated Goal: to return home alone  Visit Information  Last PT Received On: 09/30/13 Assistance Needed: +2 (for ambulation) History of Present Illness: Erika Wang is a 62 y.o. female with a history of no known medical problems though she does not see a physician on a regular basis. She was in her normal state until today at 11 AM at which point she had sudden onset of left-sided weakness. Therefore 911 was called. On arrival here, she was seen to have a left hemi-plegia with  NIH of 9, taken to CT following which she had some improvement and NIH of approximately 4, but still had significant left leg weakness and therefore t-PA was initiated.    Subjective Data  Patient Stated Goal: to return home alone   Cognition  Cognition Arousal/Alertness: Awake/alert Behavior During Therapy: WFL for tasks  assessed/performed Overall Cognitive Status: Impaired/Different from baseline Area of Impairment: Attention;Memory;Safety/judgement;Awareness;Problem solving Current Attention Level: Selective Memory: Decreased recall of precautions Safety/Judgement: Decreased awareness of safety;Decreased awareness of deficits Awareness: Emergent Problem Solving: Slow processing;Decreased initiation;Difficulty sequencing;Requires tactile cues    Balance  Balance Balance Assessed: Yes (decreased postural control) Static Sitting Balance Static Sitting - Balance Support: Feet supported;No upper extremity supported Static Sitting - Level of Assistance: 4: Min assist;Other (comment) (post lean at times - related to attention) Static Standing Balance Static Standing - Balance Support: Left upper extremity supported;During functional activity Static Standing - Level of Assistance: 3: Mod assist;Other (comment) (L bias)  End of Session PT - End of Session Equipment Utilized During Treatment: Gait belt Activity Tolerance: Patient tolerated treatment well Patient left: in chair;with call bell/phone within reach Nurse Communication: Mobility status   GP     Marcene Brawn 09/30/2013, 2:59 PM  Lewis Shock, PT, DPT Pager #: [phone] Office #: [phone]

## 2013-09-30 NOTE — Evaluation (Signed)
Speech Language Pathology Evaluation Patient Details Name: Erika Wang MRN: [id number] DOB: 1951/08/22 Today's Date: 09/30/2013 Time: [id number]; 1120-1130 SLP Time Calculation (min): 18 min, 10 min  Problem List:  Patient Active Problem List   Diagnosis Date Noted  . Stroke 09/28/2013   Past Medical History: History reviewed. No pertinent past medical history. Past Surgical History:  Past Surgical History  Procedure Laterality Date  . Cesarean section     HPI:  62 year old female admitted 09/28/13 due to left sided weakness.  Pt given t-PA in ED. MRI revealed large Basal Ganglia Infarct, CXR indicated clear lungs.   Assessment / Plan / Recommendation Clinical Impression  Pt presents with moderate cognitive-linguistic deficits marked by impaired selective attention, working and prospective memory, problem-solving and insight. Pt is oriented x4; has a mild dysarthria of speech.    Returned to room at 11:20 when daughter arrived and discussed cognitive status and need for f/u therapy.  Pt was working/living in [employer] [employer] [facility] PTA; daughter asserts she can provide necessary supervision post-D/C as long as pt remains in [place].       SLP Assessment  Patient needs continued Speech Language Pathology Services    Follow Up Recommendations  Inpatient Rehab    Frequency and Duration min 2x/week  2 weeks   Pertinent Vitals/Pain No c/o pain   SLP Goals  SLP Goals Potential to Achieve Goals: Good Potential Considerations: Family/community support;Cooperation/participation level Progress/Goals/Alternative treatment plan discussed with pt/caregiver and they: Agree  SLP Evaluation Prior Functioning  Cognitive/Linguistic Baseline: Within functional limits  Lives With: Alone Available Help at Discharge: Family Education: 12th grade education per pt. Vocation: Full time employment   Cognition  Overall Cognitive Status: Impaired/Different from baseline Arousal/Alertness:  Awake/alert Orientation Level: Oriented X4 Attention: Selective Selective Attention: Impaired Selective Attention Impairment: Verbal basic;Functional basic Memory: Impaired Memory Impairment: Storage deficit;Retrieval deficit;Decreased recall of new information;Prospective memory Awareness: Impaired Problem Solving: Impaired Problem Solving Impairment: Verbal basic;Functional basic Behaviors: Impulsive;Perseveration Safety/Judgment: Impaired    Comprehension  Auditory Comprehension Overall Auditory Comprehension: Appears within functional limits for tasks assessed Visual Recognition/Discrimination Discrimination: Within Function Limits Reading Comprehension Reading Status: Not tested    Expression Expression Primary Mode of Expression: Verbal Verbal Expression Overall Verbal Expression: Appears within functional limits for tasks assessed   Oral / Motor Oral Motor/Sensory Function Overall Oral Motor/Sensory Function: Impaired Motor Speech Overall Motor Speech: Impaired Articulation: Impaired Level of Impairment: Conversation Intelligibility: Intelligibility reduced   Naviyah Schaffert L. Samson Frederic, [place] CCC/SLP Pager [phone]      Blenda Mounts [place] 09/30/2013, 11:35 AM

## 2013-09-30 NOTE — Progress Notes (Signed)
Stroke Team Progress Note  HISTORY Erika Wang is a 62 y.o. female with a history of no known medical problems though she does not see a physician on a regular basis. She was in her normal state until today at 11 AM at which point she had sudden onset of left-sided weakness. Therefore 911 was called. On arrival here, she was seen to have a left hemi-plegia with NIH of 9, taken to CT following which she had some improvement and NIH of approximately 4, but still had significant left leg weakness and therefore t-PA was initiated.   Following administration of TPA, the patient had acute worsening of her symptoms and the 2 IVs that she had were both 2 anterior to perform a CT angiogram therefore she was taken for emergent MRI/MRA which did not demonstrate any intervenable lesion.   She thankfully had subsequent significant improvement without any hemiparesis.   LKW: 11am  tpa given?: yes  NIHSS: 9 on arrival, fluctuated widely    She was admitted to the neuro ICU for further evaluation and treatment.  SUBJECTIVE  Patient sitting up eating. Had a good night. No vomiting. No neurological changes or worsening.    OBJECTIVE Most recent Vital Signs: Filed Vitals:   09/30/13 0300 09/30/13 0400 09/30/13 0446 09/30/13 0500  BP: 137/40 158/52  133/113  Pulse: 62 62  66  Temp:   98.1 F (36.7 C)   TempSrc:   Oral   Resp: 16 13  16   Height:      Weight:      SpO2: 94% 94%  94%   CBG (last 3)   Recent Labs  09/28/13 1257  GLUCAP 111*    IV Fluid Intake:   . sodium chloride Stopped (09/29/13 1327)    MEDICATIONS  . aspirin EC  325 mg Oral Daily  . lisinopril  10 mg Oral Daily  . pantoprazole  40 mg Oral Daily  . sulfamethoxazole-trimethoprim  1 tablet Oral Q12H   PRN:  acetaminophen, acetaminophen, labetalol, ondansetron  Diet:  Cardiac thin liquids Activity:  Ambulate with assistance DVT Prophylaxis:  SCD  CLINICALLY SIGNIFICANT STUDIES Basic Metabolic Panel:   Recent  Labs Lab 09/28/13 1237 09/28/13 1249 09/29/13 1250  NA 137 141 138  K 4.3 4.1 4.2  CL 97 101 102  CO2 26  --  22  GLUCOSE 124* 128* 133*  BUN 12 13 11   CREATININE 0.72 1.00 0.78  CALCIUM 9.9  --  9.0   Liver Function Tests:   Recent Labs Lab 09/28/13 1237 09/29/13 1250  AST 18 17  ALT 9 7  ALKPHOS 90 77  BILITOT 0.2* 0.3  PROT 8.0 6.7  ALBUMIN 3.6 3.0*   CBC:   Recent Labs Lab 09/28/13 1237 09/28/13 1249 09/29/13 1250  WBC 6.0  --  4.6  NEUTROABS 3.8  --   --   HGB 14.5 14.6 13.0  HCT 43.1 43.0 39.2  MCV 89.4  --  89.5  PLT 207  --  196   Coagulation:   Recent Labs Lab 09/28/13 1237  LABPROT 12.5  INR 0.95   Cardiac Enzymes:   Recent Labs Lab 09/28/13 1237 09/29/13 1250  CKTOTAL  --  134  CKMB  --  2.7  TROPONINI <0.30  --    Urinalysis:   Recent Labs Lab 09/28/13 1449  COLORURINE YELLOW  LABSPEC 1.016  PHURINE 7.0  GLUCOSEU NEGATIVE  HGBUR MODERATE*  BILIRUBINUR NEGATIVE  KETONESUR NEGATIVE  PROTEINUR 100*  UROBILINOGEN 0.2  NITRITE POSITIVE*  LEUKOCYTESUR LARGE*   Lipid Panel    Component Value Date/Time   CHOL 176 09/29/2013 0415   TRIG 130 09/29/2013 0415   HDL 51 09/29/2013 0415   CHOLHDL 3.5 09/29/2013 0415   VLDL 26 09/29/2013 0415   LDLCALC 99 09/29/2013 0415   HgbA1C  Lab Results  Component Value Date   HGBA1C 5.9* 09/29/2013    Urine Drug Screen:     Component Value Date/Time   LABOPIA NONE DETECTED 09/28/2013 1449   COCAINSCRNUR NONE DETECTED 09/28/2013 1449   LABBENZ NONE DETECTED 09/28/2013 1449   AMPHETMU NONE DETECTED 09/28/2013 1449   THCU NONE DETECTED 09/28/2013 1449   LABBARB NONE DETECTED 09/28/2013 1449    Alcohol Level:   Recent Labs Lab 09/28/13 1237  ETH <11    Ct Head Wo Contrast 09/28/2013   No evidence of acute intracranial abnormality.  Nonspecific small vessel ischemic changes in the subcortical right frontal lobe.   Dg Chest Port 1 View 09/28/2013   Cardiomegaly without  decompensation    Mr Brain Ltd W/o Cm 09/28/2013   Axial diffusion-weighted imaging demonstrating confluent restricted diffusion affecting a 3.5 cm area of the right basal ganglia. Minimal white matter involvement adjacent to the right caudate nucleus.    Mr Maxine Glenn Head/brain Wo Cm 09/28/2013   1. Anterior circulation is negative except for mild irregularity compatible with atherosclerosis. No right M1 or A1 occlusion or high-grade stenosis. 2. Confluent acute right basal ganglia infarct. 3. 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   2D Echocardiogram  EF 65%, wall motion normal. Atrial septum with borderline criteria for aneurysm  Carotid Doppler  Findings suggest 1-39% internal carotid artery stenosis bilaterally. Vertebral arteries are patent with antegrade flow  EKG  normal sinus rhythm.   Therapy Recommendations   Physical Exam   Patient is awake, alert, oriented to person, place, month, year, and situation.  Immediate and remote memory are intact.  Patient is able to give a clear and coherent history.  No signs of aphasia or neglect  Cranial Nerves:  II: Visual Fields are full. Pupils are equal, round, and reactive to light. Discs are difficult to visualize.  III,IV, VI: EOMI without ptosis or diploplia.  V: Facial sensation is symmetric to temperature  VII: Facial movement is weak on left  VIII: hearing is intact to voice  X: Uvula elevates symmetrically  XI: Shoulder shrug is symmetric.  XII: tongue is midline without atrophy or fasciculations.  Motor:  Tone is markedly increased on the left. Bulk is normal. 5/5 strength was present on the right, minimal movement of the left arm and leg  Sensory:  Sensation is symmetric to light touch and temperature in the arms and legs.  Deep Tendon Reflexes:  2+ in the biceps and patellae on the right, difficult to elicit on the left due to increased tone  Cerebellar:  FNF and HKS are intact on the right,  unable to obtain on the left  Gait:  Not tested due to weakness   ASSESSMENT Erika Wang is a 62 y.o. female presenting with left hemiparesis. Status post IV t-PA 09/28/2013 at 1220. Imaging confirms a right basal ganglia infarct. Infarct felt to be thrombotic secondary to small vessel disease.  On no antithrombotics prior to admission. Now on no antithrombotics for secondary stroke prevention (patient received tPA). Patient with resultant left hemiparesis. Work up underway.   LDL 99, at goal, no statin indicated.  Abnormal urine, gram negative rods, culture pending,  on antibiotics  Smoker, cessation counseling  HGB A1C  5.9  Hospital day # 2  TREATMENT/PLAN   Aspirin 325mg  daily started for secondary stroke prevention.  Risk factor modification  Smoking cessation, nicoderm patch ordered.  CIR candidate  Transfer out of unit.  Oral blood pressure medications started  Patient stable. May transfer to rehab once bed available.  Gwendolyn Lima. Manson Passey, [facility], MBA, MHA [facility] Pager: [phone] 09/30/2013 11:41 AM  I have personally obtained a history, examined the patient, evaluated imaging results, and formulated the assessment and plan of care. I agree with the above. Delia Heady, MD

## 2013-09-30 NOTE — Consult Note (Signed)
Physical Medicine and Rehabilitation Consult Reason for Consult: CVA Referring Physician: Dr. Pearlean Brownie   HPI: Erika Wang is a 62 y.o. right-handed female with history of tobacco abuse admitted 09/28/2013 of left-sided weakness. Patient independent prior to admission living alone in [place] and was visiting her daughter in [place]. MRI showed right basal ganglia infarct. MRA of the head with anterior circulation negative except for some mild irregularity compatible with atherosclerosis. Echocardiogram with ejection fraction of 70% no wall motion abnormalities. Carotid Dopplers with no ICA stenosis. Neurology services consulted patient did receive TPA. Placed on aspirin for CVA prophylaxis. Patient is maintained on a regular diet. Physical therapy evaluation completed 09/29/2013 with recommendations for physical medicine rehabilitation consult to consider inpatient rehabilitation services.   Review of Systems  Respiratory: Positive for cough.   Musculoskeletal: Positive for myalgias.  All other systems reviewed and are negative.   History reviewed. No pertinent past medical history. Past Surgical History  Procedure Laterality Date  . Cesarean section     History reviewed. No pertinent family history. Social History:  reports that she has been smoking.  She does not have any smokeless tobacco history on file. Her alcohol and drug histories are not on file. Allergies: No Known Allergies No prescriptions prior to admission    Home: Home Living Family/patient expects to be discharged to:: Inpatient rehab Living Arrangements: Alone Additional Comments: was independent and working at [employer]  Functional History: Prior Function Comments: reports she can hopefully stay with daughter if she needs to Functional Status:  Mobility: Bed Mobility Bed Mobility: Supine to Sit Supine to Sit: HOB elevated;With rails;3: Mod assist Sitting - Scoot to Edge of Bed: With rail;3: Mod  assist Transfers Transfers: Sit to Stand;Stand to Sit;Stand Pivot Transfers Sit to Stand: 1: +2 Total assist;With upper extremity assist;From chair/3-in-1 Sit to Stand: Patient Percentage: 60% Stand to Sit: 1: +2 Total assist;With upper extremity assist;To chair/3-in-1 Stand to Sit: Patient Percentage: 60% Stand Pivot Transfers: 1: +2 Total assist Stand Pivot Transfers: Patient Percentage: 60% Ambulation/Gait Ambulation/Gait Assistance: 1: +2 Total assist Ambulation/Gait: Patient Percentage: 60% Ambulation Distance (Feet):  (5 steps to chair) Assistive device: 2 person hand held assist Ambulation/Gait Assistance Details: assist for L LE advancement and provided blocking to L knee to prevent buckling, Gait Pattern: Step-to pattern;Decreased step length - left;Decreased stance time - left Gait velocity: slow Stairs: No    ADL:    Cognition: Cognition Overall Cognitive Status: Within Functional Limits for tasks assessed Orientation Level: Oriented X4 Cognition Arousal/Alertness: Awake/alert Behavior During Therapy: WFL for tasks assessed/performed Overall Cognitive Status: Within Functional Limits for tasks assessed  Blood pressure 133/113, pulse 66, temperature 98.1 F (36.7 C), temperature source Oral, resp. rate 16, height 5' (1.524 m), weight 53.7 kg (118 lb 6.2 oz), SpO2 94.00%. Physical Exam  Vitals reviewed. Constitutional: She is oriented to person, place, and time. She appears well-developed and well-nourished.  HENT:  Head: Normocephalic.  Poor dentition  Eyes: EOM are normal.  Neck: Normal range of motion. Neck supple. No thyromegaly present.  Cardiovascular: Normal rate and regular rhythm.   Respiratory: Effort normal and breath sounds normal. No respiratory distress.  GI: Soft. Bowel sounds are normal. She exhibits no distension.  Neurological: She is alert and oriented to person, place, and time.  Follows commands. Fair insight to her deficits. Left facial  weakness. Speech slurred but intelligible. LUE is 2/5 deltoid, 2+ to 3- at bicep and tricep, 3/5 HI. LLE is 3- HF and 3+ KE,  Ankle 3-4/5. No gross sensory deficits. Cognitively within normal limits.   Skin: Skin is warm and dry.  Psychiatric: She has a normal mood and affect. Her behavior is normal. Judgment and thought content normal.    Results for orders placed during the hospital encounter of 09/28/13 (from the past 24 hour(s))  COMPREHENSIVE METABOLIC PANEL     Status: Abnormal   Collection Time    09/29/13 12:50 PM      Result Value Range   Sodium 138  135 - 145 mEq/L   Potassium 4.2  3.5 - 5.1 mEq/L   Chloride 102  96 - 112 mEq/L   CO2 22  19 - 32 mEq/L   Glucose, Bld 133 (*) 70 - 99 mg/dL   BUN 11  6 - 23 mg/dL   Creatinine, Ser 4.09  0.50 - 1.10 mg/dL   Calcium 9.0  8.4 - 81.1 mg/dL   Total Protein 6.7  6.0 - 8.3 g/dL   Albumin 3.0 (*) 3.5 - 5.2 g/dL   AST 17  0 - 37 U/L   ALT 7  0 - 35 U/L   Alkaline Phosphatase 77  39 - 117 U/L   Total Bilirubin 0.3  0.3 - 1.2 mg/dL   GFR calc non Af Amer 89 (*) >90 mL/min   GFR calc Af Amer >90  >90 mL/min  CBC     Status: None   Collection Time    09/29/13 12:50 PM      Result Value Range   WBC 4.6  4.0 - 10.5 K/uL   RBC 4.38  3.87 - 5.11 MIL/uL   Hemoglobin 13.0  12.0 - 15.0 g/dL   HCT 91.4  78.2 - 95.6 %   MCV 89.5  78.0 - 100.0 fL   MCH 29.7  26.0 - 34.0 pg   MCHC 33.2  30.0 - 36.0 g/dL   RDW 21.3  08.6 - 57.8 %   Platelets 196  150 - 400 K/uL  CK TOTAL AND CKMB     Status: None   Collection Time    09/29/13 12:50 PM      Result Value Range   Total CK 134  7 - 177 U/L   CK, MB 2.7  0.3 - 4.0 ng/mL   Relative Index 2.0  0.0 - 2.5   Ct Head Wo Contrast  09/29/2013   CLINICAL DATA:  Frontal headache with nausea and vomiting.  EXAM: CT HEAD WITHOUT CONTRAST  TECHNIQUE: Contiguous axial images were obtained from the base of the skull through the vertex without intravenous contrast.  COMPARISON:  None.  FINDINGS: Acute right  lenticular nucleus/caudate/right perioperculum infarct. Progressive mild mass effect upon the right lateral ventricle without midline shift. No associated hemorrhage.  No intracranial mass lesion noted on this unenhanced exam.  IMPRESSION: Acute right lenticular nucleus/caudate/right perioperculum infarct. Progressive mild mass effect upon the right lateral ventricle without midline shift. No associated hemorrhage.   Electronically Signed   By: Bridgett Larsson M.D.   On: 09/29/2013 11:34   Ct Head Wo Contrast  09/28/2013   CLINICAL DATA:  Code stroke, left-sided weakness, slurred speech  EXAM: CT HEAD WITHOUT CONTRAST  TECHNIQUE: Contiguous axial images were obtained from the base of the skull through the vertex without intravenous contrast.  COMPARISON:  None.  FINDINGS: No evidence of parenchymal hemorrhage or extra-axial fluid collection. No mass lesion, mass effect, or midline shift.  No CT evidence of acute infarction.  Subcortical hypodensity in the posterior  right frontal lobe (series 2/images 15 is 16), compatible with nonspecific white matter small vessel ischemic changes.  Cerebral volume is within normal limits.  No ventriculomegaly.  The visualized paranasal sinuses are essentially clear. The mastoid air cells are unopacified.  No evidence of calvarial fracture.  IMPRESSION: No evidence of acute intracranial abnormality.  Nonspecific small vessel ischemic changes in the subcortical right frontal lobe.  These results were called by telephone at the time of interpretation on 09/28/2013 at 12:42 PM to Dr. Amada Jupiter, who verbally acknowledged these results.   Electronically Signed   By: Charline Bills M.D.   On: 09/28/2013 12:43   Dg Chest Port 1 View  09/28/2013   CLINICAL DATA:  Stroke  EXAM: PORTABLE CHEST - 1 VIEW  COMPARISON:  None.  FINDINGS: Mild cardiomegaly. Clear lungs. No pneumothorax. No acute bony deformity. No pleural effusion.  IMPRESSION: Cardiomegaly without decompensation    Electronically Signed   By: Maryclare Bean M.D.   On: 09/28/2013 14:44   Mr Brain Ltd W/o Cm  09/28/2013   CLINICAL DATA:  62 year old female code stroke, with left side weakness. Initial encounter. Undergoing tPA infusion. Query M1 MCA occlusion.  EXAM: MRA HEAD WITHOUT CONTRAST  LIMITED MRI HEAD WITHOUT CONTRAST  TECHNIQUE: Angiographic images of the Circle of Willis were obtained using MRA technique without intravenous contrast.  Axial diffusion-weighted images were also obtained.  COMPARISON:  Head CT without contrast 1226 hr the same day.  FINDINGS: MRI:  Axial diffusion-weighted imaging demonstrating confluent restricted diffusion affecting a 3.5 cm area of the right basal ganglia. Minimal white matter involvement adjacent to the right caudate nucleus.  MRA:  Antegrade flow in the posterior circulation which is primarily supplied by a dolichoectatic distal right vertebral artery. The proximal basilar artery likewise is dolichoectatic, with a horizontal configuration contributing to artifactual flow signal loss in the proximal basilar. The distal left vertebral artery functionally terminates in the left PICA.  No basilar artery stenosis. SCA and PCA origins are normal. Posterior communicating arteries are diminutive or absent. Bilateral PCA branches are within normal limits.  Antegrade flow in both ICA siphons. No ICA stenosis. Ophthalmic artery origins are within normal limits.  Both carotid termini are patent. Bilateral MCA and ACA origins are normal. The right A1 and M1 segments are patent. The anterior communicating artery and visualized bilateral ACA branches are within normal limits. Visualized left MCA branches are within normal limits.  The right MCA M1 segment is patent with minimal irregularity. Right MCA bifurcation is patent. Visualized right MCA branches are within normal limits.  IMPRESSION: 1. Anterior circulation is negative except for mild irregularity compatible with atherosclerosis. No right  M1 or A1 occlusion or high-grade stenosis. 2. Confluent acute right basal ganglia infarct. 3. 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 Study reviewed with Dr. Ritta Slot on 09/28/2013 at 1414 hrs.   Electronically Signed   By: Augusto Gamble M.D.   On: 09/28/2013 14:34   Mr Maxine Glenn Head/brain Wo Cm  09/28/2013   CLINICAL DATA:  62 year old female code stroke, with left side weakness. Initial encounter. Undergoing tPA infusion. Query M1 MCA occlusion.  EXAM: MRA HEAD WITHOUT CONTRAST  LIMITED MRI HEAD WITHOUT CONTRAST  TECHNIQUE: Angiographic images of the Circle of Willis were obtained using MRA technique without intravenous contrast.  Axial diffusion-weighted images were also obtained.  COMPARISON:  Head CT without contrast 1226 hr the same day.  FINDINGS: MRI:  Axial diffusion-weighted imaging demonstrating confluent restricted diffusion affecting a 3.5 cm  area of the right basal ganglia. Minimal white matter involvement adjacent to the right caudate nucleus.  MRA:  Antegrade flow in the posterior circulation which is primarily supplied by a dolichoectatic distal right vertebral artery. The proximal basilar artery likewise is dolichoectatic, with a horizontal configuration contributing to artifactual flow signal loss in the proximal basilar. The distal left vertebral artery functionally terminates in the left PICA.  No basilar artery stenosis. SCA and PCA origins are normal. Posterior communicating arteries are diminutive or absent. Bilateral PCA branches are within normal limits.  Antegrade flow in both ICA siphons. No ICA stenosis. Ophthalmic artery origins are within normal limits.  Both carotid termini are patent. Bilateral MCA and ACA origins are normal. The right A1 and M1 segments are patent. The anterior communicating artery and visualized bilateral ACA branches are within normal limits. Visualized left MCA branches are within normal limits.  The right MCA M1 segment is  patent with minimal irregularity. Right MCA bifurcation is patent. Visualized right MCA branches are within normal limits.  IMPRESSION: 1. Anterior circulation is negative except for mild irregularity compatible with atherosclerosis. No right M1 or A1 occlusion or high-grade stenosis. 2. Confluent acute right basal ganglia infarct. 3. 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 Study reviewed with Dr. Ritta Slot on 09/28/2013 at 1414 hrs.   Electronically Signed   By: Augusto Gamble M.D.   On: 09/28/2013 14:34    Assessment/Plan: Diagnosis: right basal ganglia infarct 1. Does the need for close, 24 hr/day medical supervision in concert with the patient's rehab needs make it unreasonable for this patient to be served in a less intensive setting? Yes 2. Co-Morbidities requiring supervision/potential complications: htn 3. Due to bladder management, bowel management, safety, skin/wound care, disease management, medication administration, pain management and patient education, does the patient require 24 hr/day rehab nursing? Yes 4. Does the patient require coordinated care of a physician, rehab nurse, PT (1-2 hrs/day, 5 days/week), OT (1-2 hrs/day, 5 days/week) and SLP (1-2 hrs/day, 5 days/week) to address physical and functional deficits in the context of the above medical diagnosis(es)? Yes Addressing deficits in the following areas: balance, endurance, locomotion, strength, transferring, bowel/bladder control, bathing, dressing, feeding, grooming, toileting, speech, swallowing and psychosocial support 5. Can the patient actively participate in an intensive therapy program of at least 3 hrs of therapy per day at least 5 days per week? Yes 6. The potential for patient to make measurable gains while on inpatient rehab is excellent 7. Anticipated functional outcomes upon discharge from inpatient rehab are mod I with PT, mod I with OT, mod I with SLP. 8. Estimated rehab length of  stay to reach the above functional goals is: 11-15 days 9. Does the patient have adequate social supports to accommodate these discharge functional goals? Yes 10. Anticipated D/C setting: Home 11. Anticipated post D/C treatments: HH therapy 12. Overall Rehab/Functional Prognosis: excellent  RECOMMENDATIONS: This patient's condition is appropriate for continued rehabilitative care in the following setting: CIR Patient has agreed to participate in recommended program. Yes Note that insurance prior authorization may be required for reimbursement for recommended care.  Comment: Rehab RN to follow up.   Ranelle Oyster, MD, [place] [facility]     09/30/2013

## 2013-10-01 LAB — URINALYSIS W MICROSCOPIC + REFLEX CULTURE
Glucose, UA: NEGATIVE mg/dL
Ketones, ur: NEGATIVE mg/dL
Nitrite: NEGATIVE
Protein, ur: NEGATIVE mg/dL
Urobilinogen, UA: 1 mg/dL (ref 0.0–1.0)

## 2013-10-01 LAB — URINE CULTURE: Colony Count: >=100000

## 2013-10-01 NOTE — Progress Notes (Signed)
Speech Language Pathology Treatment: Dysphagia  Patient Details Name: Erika Wang MRN: [id number] DOB: 04/20/1951 Today's Date: 10/01/2013 Time: 1478-2956 SLP Time Calculation (min): 12 min  Assessment / Plan / Recommendation Clinical Impression  Pt tolerating soft consistency with thin liquids. No overt s/s aspiration observed or reported.  Given right side weakness, recommend continuing with chopped meats, to facilitate safety and independence.    HPI HPI: 62 year old female admitted 09/28/13 due to left sided weakness.  Pt given t-PA in ED. MRI revealed large Basal Ganglia Infarct, CXR indicated clear lungs.   Pertinent Vitals VSS  SLP Plan  Continue with current plan of care    Recommendations Diet recommendations: Dysphagia 3 (mechanical soft);Thin liquid (chop meats) Liquids provided via: Cup;Straw Medication Administration: Whole meds with puree Supervision: Patient able to self feed Compensations: Slow rate;Small sips/bites;Follow solids with liquid;Check for anterior loss Postural Changes and/or Swallow Maneuvers: Seated upright 90 degrees;Upright 30-60 min after meal              General recommendations: Rehab consult Oral Care Recommendations: Oral care before and after PO Follow up Recommendations: Inpatient Rehab Plan: Continue with current plan of care    GO    Erika Wang B. Erika Wang, [facility], CCC-SLP [phone]  Erika Wang 10/01/2013, 12:08 PM

## 2013-10-01 NOTE — Progress Notes (Signed)
Physical Therapy Treatment Patient Details Name: Erika Wang MRN: [id number] DOB: [birth year] Today's Date: 10/01/2013 Time: 8295-6213 PT Time Calculation (min): 8 min  PT Assessment / Plan / Recommendation  History of Present Illness Erika Wang is a 62 y.o. female with a history of no known medical problems though she does not see a physician on a regular basis. She was in her normal state until today at 11 AM at which point she had sudden onset of left-sided weakness. Therefore 911 was called. On arrival here, she was seen to have a left hemi-plegia with NIH of 9, taken to CT following which she had some improvement and NIH of approximately 4, but still had significant left leg weakness and therefore t-PA was initiated.   PT Comments   Pt is progressing well with therapies, getting some good return in her left leg.  She is ready for trials of assistive devices likely most appropriate for L PFRW trial next session.  She continues to work hard and is motivated to get to be as independent as possible.  She is appropriate at this time for CIR level therapies at discharge.    Follow Up Recommendations  CIR     Does the patient have the potential to tolerate intense rehabilitation    Yes  Barriers to Discharge   None      Equipment Recommendations  Rolling walker with 5" wheels;Other (comment) (L PFRW)    Recommendations for Other Services Rehab consult  Frequency Min 4X/week   Progress towards PT Goals Progress towards PT goals: Progressing toward goals  Plan Current plan remains appropriate    Precautions / Restrictions Precautions Precautions: Fall Precaution Comments: L hemiparesis, left shoulder pain Restrictions Weight Bearing Restrictions: No   Pertinent Vitals/Pain See vitals flow sheet.     Mobility  Bed Mobility Bed Mobility: Rolling Right;Right Sidelying to Sit Rolling Right: 4: Min assist Right Sidelying to Sit: 4: Min assist;HOB flat;With rails Details for Bed  Mobility Assistance: Pt needed mod assist with mod instructional cueing for sequencing bed mobility. Transfers Transfers: Stand to Sit Sit to Stand: 4: Min assist;With upper extremity assist;From bed;From toilet Stand to Sit: 4: Min assist;To bed;Without upper extremity assist Details for Transfer Assistance: min assist to support trunk to lower to sitting.   Ambulation/Gait Ambulation/Gait Assistance: 1: +2 Total assist;3: Mod assist Ambulation/Gait: Patient Percentage: 70% Ambulation Distance (Feet): 100 Feet Assistive device: 2 person hand held assist;None Ambulation/Gait Assistance Details: started out two person hand held assit with pt leaning to the left and pushing with the right hand.  With manual cues and verbal cues pt was able to push less with right hand and center her body more over her feet.  Cues and at times manual assist needed to help with left leg progression as she fatigues.  Mod one person assist to walk from door in room to bed, pt reaching with her free right hand for support on furniture in room.   Gait Pattern: Step-to pattern;Decreased dorsiflexion - left;Decreased hip/knee flexion - left;Left flexed knee in stance;Lateral trunk lean to left Gait velocity: decreased General Gait Details: pushing to the left.  Modified Rankin (Stroke Patients Only) Pre-Morbid Rankin Score: No symptoms Modified Rankin: Moderately severe disability      PT Goals (current goals can now be found in the care plan section) Acute Rehab PT Goals Patient Stated Goal: to return home alone  Visit Information  Last PT Received On: 10/01/13 Assistance Needed: +1 History of Present Illness: Erika Potash  Wang is a 62 y.o. female with a history of no known medical problems though she does not see a physician on a regular basis. She was in her normal state until today at 11 AM at which point she had sudden onset of left-sided weakness. Therefore 911 was called. On arrival here, she was seen to have a  left hemi-plegia with NIH of 9, taken to CT following which she had some improvement and NIH of approximately 4, but still had significant left leg weakness and therefore t-PA was initiated.    Subjective Data  Subjective: Pt reports her speech is improving as well as her leg strength Patient Stated Goal: to return home alone   Cognition  Cognition Arousal/Alertness: Awake/alert Behavior During Therapy: WFL for tasks assessed/performed Overall Cognitive Status: Impaired/Different from baseline Area of Impairment: Memory;Safety/judgement Current Attention Level: Sustained Memory: Decreased recall of precautions Safety/Judgement: Decreased awareness of safety Awareness: Intellectual Problem Solving: Slow processing;Decreased initiation;Difficulty sequencing;Requires tactile cues General Comments: Pt found in bed with HOB at 33 degrees.  She was not aware of her swallowing precautions posted above the bed and that the [facility] needed to be elevated to 90 degrees.    Balance  Balance Balance Assessed: Yes Static Sitting Balance Static Sitting - Balance Support: Bilateral upper extremity supported;Feet supported Static Sitting - Level of Assistance: 5: Stand by assistance Static Standing Balance Static Standing - Balance Support: Left upper extremity supported;Right upper extremity supported Static Standing - Level of Assistance: 3: Mod assist Dynamic Standing Balance Dynamic Standing - Balance Support: Left upper extremity supported;Right upper extremity supported Dynamic Standing - Level of Assistance: 3: Mod assist  End of Session PT - End of Session Equipment Utilized During Treatment: Gait belt Activity Tolerance: Patient limited by fatigue Patient left: in bed;with family/visitor present (seated EOB with family and visitors)     Lurena Joiner B. Morissa Obeirne, PT, DPT [phone]   10/01/2013, 4:04 PM

## 2013-10-01 NOTE — Progress Notes (Signed)
Speech Language Pathology Treatment: Cognitive-Linquistic  Patient Details Name: Maicey Barrientez MRN: [id number] DOB: [birth year] Today's Date: 10/01/2013 Time: 4098-1191 SLP Time Calculation (min): 15 min  Assessment / Plan / Recommendation Clinical Impression  Significant improvement since initial eval by this SLP. Pt shows improvement in sustained and selective attention, functional recall, and verbal problem solving.  Recommend continued ST for cognitive reorganization, especially given level of independence prior to admit.   HPI HPI: 62 year old female admitted 09/28/13 due to left sided weakness.  Pt given t-PA in ED. MRI revealed large Basal Ganglia Infarct, CXR indicated clear lungs.   Pertinent Vitals VSS  SLP Plan  Continue with current plan of care    Recommendations Diet recommendations: Dysphagia 3 (mechanical soft);Thin liquid (chop meats) Liquids provided via: Cup;Straw Medication Administration: Whole meds with puree Supervision: Patient able to self feed Compensations: Slow rate;Small sips/bites;Follow solids with liquid;Check for anterior loss Postural Changes and/or Swallow Maneuvers: Seated upright 90 degrees;Upright 30-60 min after meal              General recommendations: Rehab consult Oral Care Recommendations: Oral care before and after PO Follow up Recommendations: Inpatient Rehab Plan: Continue with current plan of care    GO   Shirlene Andaya B. Aucilla, [facility], CCC-SLP [phone]   [name] 10/01/2013, 12:36 PM

## 2013-10-01 NOTE — Progress Notes (Signed)
Pt found on floor, pt states she got up to go to the bathroom and fell on the way back to bed. She did not inquire any injuries and assessment is benign, vital signs stable. Np K.Schorr notified, no actions required. Pt in camera room, bed alarm set, and pt educated on the importance of calling for assistance. Will continue to monitor.

## 2013-10-01 NOTE — Progress Notes (Signed)
Stroke Team Progress Note  HISTORY Erika Wang is a 62 y.o. female with a history of no known medical problems though she does not see a physician on a regular basis. She was in her normal state until today at 11 AM at which point she had sudden onset of left-sided weakness. Therefore 911 was called. On arrival here, she was seen to have a left hemi-plegia with NIH of 9, taken to CT following which she had some improvement and NIH of approximately 4, but still had significant left leg weakness and therefore t-PA was initiated.   Following administration of TPA, the patient had acute worsening of her symptoms and the 2 IVs that she had were both 2 anterior to perform a CT angiogram therefore she was taken for emergent MRI/MRA which did not demonstrate any intervenable lesion.   She thankfully had subsequent significant improvement without any hemiparesis.   LKW: 11am  tpa given?: yes  NIHSS: 9 on arrival, fluctuated widely    She was admitted to the neuro ICU for further evaluation and treatment.  SUBJECTIVE  Patient sittting in bed. Feeling better. Anticipating transfer to CIR when bed available.  OBJECTIVE Most recent Vital Signs: Filed Vitals:   09/30/13 1331 09/30/13 2000 10/01/13 0023 10/01/13 0400  BP: 124/64 139/81 144/73 161/82  Pulse: 70 68 74 76  Temp: 97.5 F (36.4 C) 98.1 F (36.7 C) 99.4 F (37.4 C) 99.1 F (37.3 C)  TempSrc: Oral Oral Oral Oral  Resp: 18 18 16 18   Height:      Weight:      SpO2: 97% 98% 97% 97%   CBG (last 3)   Recent Labs  09/28/13 1257  GLUCAP 111*    IV Fluid Intake:   . sodium chloride Stopped (09/29/13 1327)    MEDICATIONS  . aspirin EC  325 mg Oral Daily  . atorvastatin  10 mg Oral q1800  . lisinopril  10 mg Oral Daily  . nicotine  7 mg Transdermal Daily  . pantoprazole  40 mg Oral Daily  . sulfamethoxazole-trimethoprim  1 tablet Oral Q12H   PRN:  acetaminophen, acetaminophen, labetalol, ondansetron  Diet:  Cardiac thin  liquids Activity:  Ambulate with assistance DVT Prophylaxis:  SCD  CLINICALLY SIGNIFICANT STUDIES Basic Metabolic Panel:   Recent Labs Lab 09/28/13 1237 09/28/13 1249 09/29/13 1250  NA 137 141 138  K 4.3 4.1 4.2  CL 97 101 102  CO2 26  --  22  GLUCOSE 124* 128* 133*  BUN 12 13 11   CREATININE 0.72 1.00 0.78  CALCIUM 9.9  --  9.0   Liver Function Tests:   Recent Labs Lab 09/28/13 1237 09/29/13 1250  AST 18 17  ALT 9 7  ALKPHOS 90 77  BILITOT 0.2* 0.3  PROT 8.0 6.7  ALBUMIN 3.6 3.0*   CBC:   Recent Labs Lab 09/28/13 1237 09/28/13 1249 09/29/13 1250  WBC 6.0  --  4.6  NEUTROABS 3.8  --   --   HGB 14.5 14.6 13.0  HCT 43.1 43.0 39.2  MCV 89.4  --  89.5  PLT 207  --  196   Coagulation:   Recent Labs Lab 09/28/13 1237  LABPROT 12.5  INR 0.95   Cardiac Enzymes:   Recent Labs Lab 09/28/13 1237 09/29/13 1250  CKTOTAL  --  134  CKMB  --  2.7  TROPONINI <0.30  --    Urinalysis:   Recent Labs Lab 09/28/13 1449 10/01/13 0227  COLORURINE YELLOW YELLOW  LABSPEC 1.016 1.015  PHURINE 7.0 6.5  GLUCOSEU NEGATIVE NEGATIVE  HGBUR MODERATE* TRACE*  BILIRUBINUR NEGATIVE NEGATIVE  KETONESUR NEGATIVE NEGATIVE  PROTEINUR 100* NEGATIVE  UROBILINOGEN 0.2 1.0  NITRITE POSITIVE* NEGATIVE  LEUKOCYTESUR LARGE* SMALL*   Lipid Panel    Component Value Date/Time   CHOL 176 09/29/2013 0415   TRIG 130 09/29/2013 0415   HDL 51 09/29/2013 0415   CHOLHDL 3.5 09/29/2013 0415   VLDL 26 09/29/2013 0415   LDLCALC 99 09/29/2013 0415   HgbA1C  Lab Results  Component Value Date   HGBA1C 5.9* 09/29/2013    Urine Drug Screen:     Component Value Date/Time   LABOPIA NONE DETECTED 09/28/2013 1449   COCAINSCRNUR NONE DETECTED 09/28/2013 1449   LABBENZ NONE DETECTED 09/28/2013 1449   AMPHETMU NONE DETECTED 09/28/2013 1449   THCU NONE DETECTED 09/28/2013 1449   LABBARB NONE DETECTED 09/28/2013 1449    Alcohol Level:   Recent Labs Lab 09/28/13 1237  ETH <11    Ct  Head Wo Contrast 09/28/2013   No evidence of acute intracranial abnormality.  Nonspecific small vessel ischemic changes in the subcortical right frontal lobe.  09/30/2013 Acute right lenticular nucleus/caudate/right perioperculum infarct. Progressive mild mass effect upon the right lateral ventricle without midline shift. No associated hemorrhage.   Dg Chest Port 1 View 09/28/2013   Cardiomegaly without decompensation    Mr Brain Ltd W/o Cm 09/28/2013   Axial diffusion-weighted imaging demonstrating confluent restricted diffusion affecting a 3.5 cm area of the right basal ganglia. Minimal white matter involvement adjacent to the right caudate nucleus.    Mr Maxine Glenn Head/brain Wo Cm 09/28/2013   1. Anterior circulation is negative except for mild irregularity compatible with atherosclerosis. No right M1 or A1 occlusion or high-grade stenosis. 2. Confluent acute right basal ganglia infarct. 3. 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   2D Echocardiogram  EF 65%, wall motion normal. Atrial septum with borderline criteria for aneurysm  Carotid Doppler  Findings suggest 1-39% internal carotid artery stenosis bilaterally. Vertebral arteries are patent with antegrade flow  EKG  normal sinus rhythm.   Therapy Recommendations CIR  Physical Exam   Patient is awake, alert, oriented to person, place, month, year, and situation.  Immediate and remote memory are intact.  Patient is able to give a clear and coherent history.  No signs of aphasia or neglect  Cranial Nerves:  II: Visual Fields are full. Pupils are equal, round, and reactive to light. Discs are difficult to visualize.  III,IV, VI: EOMI without ptosis or diploplia.  V: Facial sensation is symmetric to temperature  VII: Facial movement is weak on left  VIII: hearing is intact to voice  X: Uvula elevates symmetrically  XI: Shoulder shrug is symmetric.  XII: tongue is midline without atrophy or fasciculations.   Motor:  Tone is markedly increased on the left. Bulk is normal. 5/5 strength was present on the right, minimal movement of the left arm and leg  Sensory:  Sensation is symmetric to light touch and temperature in the arms and legs.  Deep Tendon Reflexes:  2+ in the biceps and patellae on the right, difficult to elicit on the left due to increased tone  Cerebellar:  FNF and HKS are intact on the right, unable to obtain on the left  Gait:  Not tested due to weakness   ASSESSMENT [name] is a 62 y.o. female presenting with left hemiparesis. Status post IV t-PA 09/28/2013 at 1220. Imaging confirms a  right basal ganglia infarct. Infarct felt to be thrombotic secondary to small vessel disease.  On no antithrombotics prior to admission. Now on aspirin 325mg  daily for secondary stroke prevention (patient received tPA). Patient with resultant left hemiparesis.    LDL 99, statin started as patient is at upper end of goal of LDL < 100. I started a statin.  Urinary tract infection: antibiotics sensitive to ecoli on septra.  Smoker, cessation counseling, nicoderm patch  HGB A1C  5.9  Hospital day # 3  TREATMENT/PLAN   Aspirin 325mg  daily started for secondary stroke prevention.  Complete antibiotic course for UTI. (#day4)  Risk factor modification  Smoking cessation, nicoderm patch ordered.  CIR candidate. Patient stable. May transfer to rehab once bed available.  Follow up with Dr. Pearlean Brownie in 2 months in stroke clinic.  Erika Wang. Manson Passey, [facility], MBA, MHA [facility] Pager: [phone] 10/01/2013 10:06 AM  I have personally obtained a history, examined the patient, evaluated imaging results, and formulated the assessment and plan of care. I agree with the above.  Delia Heady, MD

## 2013-10-01 NOTE — Progress Notes (Signed)
Occupational Therapy Treatment Patient Details Name: Erika Wang MRN: [id number] DOB: June 21, 1951 Today's Date: 10/01/2013 Time: 4098-1191 OT Time Calculation (min): 32 min  OT Assessment / Plan / Recommendation  History of present illness Erika Wang is a 62 y.o. female with a history of no known medical problems though she does not see a physician on a regular basis. She was in her normal state until today at 11 AM at which point she had sudden onset of left-sided weakness. Therefore 911 was called. On arrival here, she was seen to have a left hemi-plegia with NIH of 9, taken to CT following which she had some improvement and NIH of approximately 4, but still had significant left leg weakness and therefore t-PA was initiated.   OT comments  Pt is making steady gains with OT.  Currently at a mod assist level for functional transfers to the toilet using hand held assist and ambulation.  Min assist for sit to stand with mod assist also needed to maintain static standing while engaged in LUE use.  She still needs extensive neuro re-education to help increase strength and function in the LUE and LLE.  Feel this would be best achieved through intensive inpatient rehab services.  Will continue to follow for acute care OT services.  Follow Up Recommendations  CIR       Equipment Recommendations  3 in 1 bedside comode;Tub/shower bench    Recommendations for Other Services Rehab consult  Frequency Min 3X/week   Progress towards OT Goals Progress towards OT goals: Progressing toward goals  Plan Discharge plan remains appropriate    Precautions / Restrictions Precautions Precautions: Fall Precaution Comments: L hemiparesis, left shoulder pain Restrictions Weight Bearing Restrictions: No   Pertinent Vitals/Pain Pain 2/10 on the faces scale in the left shoulder, no medication needed, arm repositioned with pt reporting decreased pain    ADL  Grooming: Moderate assistance Where Assessed -  Grooming: Supported standing Lower Body Dressing: Performed;Minimal assistance (to donn gripper socks) Where Assessed - Lower Body Dressing: Unsupported sitting Toilet Transfer: Performed;Moderate assistance Toilet Transfer Method: Other (comment) (ambulate with hand held assist) Toilet Transfer Equipment: Comfort height toilet;Grab bars Toileting - Clothing Manipulation and Hygiene: Performed;Maximal assistance Where Assessed - Toileting Clothing Manipulation and Hygiene: Sit to stand from 3-in-1 or toilet Equipment Used: Gait belt Transfers/Ambulation Related to ADLs: Pt needed mod facilitation for mobility with hand held assist on the left side. ADL Comments: Pt able to donn socks sitting EOB but primarily using just the RUE.  Ambulated with hand held assist to the bathroom with max instructional cueing to take a larger step on the right side and maintain left knee extension and upright trunk posture.  Pt stood at the sink for 5 mins and worked on using the LUE to wash the counter and dry it.  Min faciliation to avoid compensatory strategies of bending forward with her trunk before reaching full elbow extension.        Visit Information  Last OT Received On: 10/01/13 Assistance Needed: +1 History of Present Illness: Erika Wang is a 62 y.o. female with a history of no known medical problems though she does not see a physician on a regular basis. She was in her normal state until today at 11 AM at which point she had sudden onset of left-sided weakness. Therefore 911 was called. On arrival here, she was seen to have a left hemi-plegia with NIH of 9, taken to CT following which she had some improvement and NIH  of approximately 4, but still had significant left leg weakness and therefore t-PA was initiated.          Cognition  Cognition Arousal/Alertness: Awake/alert Behavior During Therapy: WFL for tasks assessed/performed Overall Cognitive Status: Impaired/Different from baseline Area of  Impairment: Memory;Safety/judgement Current Attention Level: Sustained Memory: Decreased recall of precautions Safety/Judgement: Decreased awareness of safety Awareness: Intellectual General Comments: Pt found in bed with HOB at 33 degrees.  She was not aware of her swallowing precautions posted above the bed and that the [facility] needed to be elevated to 90 degrees.    Mobility  Bed Mobility Bed Mobility: Rolling Right;Right Sidelying to Sit Rolling Right: 4: Min assist Right Sidelying to Sit: 4: Min assist;HOB flat;With rails Details for Bed Mobility Assistance: Pt needed mod assist with mod instructional cueing for sequencing bed mobility. Transfers Transfers: Sit to Stand;Stand to Sit Sit to Stand: 4: Min assist;With upper extremity assist;From bed;From toilet Stand to Sit: 4: Min assist;With upper extremity assist;To toilet       Balance Balance Balance Assessed: Yes Static Sitting Balance Static Sitting - Balance Support: Right upper extremity supported Static Sitting - Level of Assistance: 5: Stand by assistance Static Standing Balance Static Standing - Balance Support: Right upper extremity supported Static Standing - Level of Assistance: 3: Mod assist (Pt with frequent LOB to the left in standing.) Dynamic Standing Balance Dynamic Standing - Balance Support: Left upper extremity supported Dynamic Standing - Level of Assistance: 3: Mod assist   End of Session OT - End of Session Equipment Utilized During Treatment: Gait belt Activity Tolerance: Patient tolerated treatment well Patient left: Other (comment) (with PT for more mobility) Nurse Communication: Mobility status     Azelia Reiger OTR/L 10/01/2013, 3:52 PM

## 2013-10-01 NOTE — H&P (Signed)
Physical Medicine and Rehabilitation Admission H&P    Chief Complaint  Patient presents with  . Code Stroke  :  Chief complaint: Left-sided weakness  HPI: Erika Wang is a 62 y.o. right-handed female with history of tobacco abuse and no scheduled medications at time of admission. Admitted 09/28/2013 with left-sided weakness. Patient independent prior to admission living alone in [place]  and was visiting her daughter in . MRI showed right basal ganglia infarct. MRA of the head with anterior circulation negative except for some mild irregularity compatible with atherosclerosis. Echocardiogram with ejection fraction of 70% no wall motion abnormalities. Carotid Dopplers with no ICA stenosis. Neurology services consulted patient did receive TPA. Placed on aspirin for CVA prophylaxis. Patient is maintained on a regular diet. Blood pressure monitored with the addition of lisinopril. NicoDerm patch added for history of tobacco abuse. Urine study 09/28/2013 greater than 100,000 gram-negative rods and placed on Bactrim. Physical and occupational therapy evaluations completed 09/29/2013 with recommendations for physical medicine rehabilitation consult to consider inpatient rehabilitation services. Patient was felt to be a good candidate for inpatient rehabilitation services and was admitted for comprehensive rehabilitation program   ROS Review of Systems  Respiratory: Positive for cough.  Musculoskeletal: Positive for myalgias.  All other systems reviewed and are negative.  History reviewed. No pertinent past medical history. Past Surgical History  Procedure Laterality Date  . Cesarean section     History reviewed. No pertinent family history. Social History:  reports that she has been smoking.  She does not have any smokeless tobacco history on file. Her alcohol and drug histories are not on file. Allergies: No Known Allergies No prescriptions prior to admission     Home: Home Living Family/patient expects to be discharged to:: Inpatient rehab Living Arrangements: Alone Available Help at Discharge: Family Additional Comments: was independent and working at [employer]; lives in [place]  Lives With: Alone   Functional History: Prior Function Vocation: Full time employment Comments: daughter has 2 week old baby  Functional Status:  Mobility: Bed Mobility Bed Mobility: Supine to Sit;Sitting - Scoot to Edge of Bed Supine to Sit: 4: Min assist;HOB elevated;With rails Sitting - Scoot to Edge of Bed: 4: Min assist;With rail Transfers Transfers: Sit to Stand;Stand to Sit Sit to Stand: 3: Mod assist;From bed Sit to Stand: Patient Percentage: 60% Stand to Sit: 3: Mod assist;To chair/3-in-1 Stand to Sit: Patient Percentage: 60% Stand Pivot Transfers: 1: +2 Total assist Stand Pivot Transfers: Patient Percentage: 60% Ambulation/Gait Ambulation/Gait Assistance: 1: +2 Total assist Ambulation/Gait: Patient Percentage: 70% Ambulation Distance (Feet): 100 Feet (x2) Assistive device: 2 person hand held assist Ambulation/Gait Assistance Details: pt with L sided neglect but no L knee buckling this date. Gait Pattern: Step-through pattern;Decreased stride length;Narrow base of support (occasional cross over) Gait velocity: slow General Gait Details: pushing to R Stairs: No    ADL: ADL Eating/Feeding: Other (comment) (modified diet) Grooming: Moderate assistance Where Assessed - Grooming: Supported sitting Upper Body Bathing: Minimal assistance Where Assessed - Upper Body Bathing: Supported sitting Lower Body Bathing: Moderate assistance Where Assessed - Lower Body Bathing: Supported sit to stand Upper Body Dressing: Moderate assistance Where Assessed - Upper Body Dressing: Supported sitting Lower Body Dressing: Moderate assistance Where Assessed - Lower Body Dressing: Supported sit to stand Toilet Transfer: Moderate assistance Toilet Transfer  Method: Stand pivot Toilet Transfer Equipment: Bedside commode Equipment Used: Gait belt Transfers/Ambulation Related to ADLs: Mod A with abulation. facilitation for LLE pattern and manual facilitation for weight   shift  ADL Comments: donned socks in bed  Cognition: Cognition Overall Cognitive Status: Impaired/Different from baseline Arousal/Alertness: Awake/alert Orientation Level: Oriented X4 Attention: Selective Selective Attention: Impaired Selective Attention Impairment: Verbal basic;Functional basic Memory: Impaired Memory Impairment: Storage deficit;Retrieval deficit;Decreased recall of new information;Prospective memory Awareness: Impaired Problem Solving: Impaired Problem Solving Impairment: Verbal basic;Functional basic Behaviors: Impulsive;Perseveration Safety/Judgment: Impaired Cognition Arousal/Alertness: Awake/alert Behavior During Therapy: WFL for tasks assessed/performed Overall Cognitive Status: Impaired/Different from baseline Area of Impairment: Attention;Memory;Safety/judgement;Awareness;Problem solving Current Attention Level: Selective Memory: Decreased recall of precautions Safety/Judgement: Decreased awareness of safety;Decreased awareness of deficits Awareness: Emergent Problem Solving: Slow processing;Decreased initiation;Difficulty sequencing;Requires tactile cues  Physical Exam: Blood pressure 161/82, pulse 76, temperature 99.1 F (37.3 C), temperature source Oral, resp. rate 18, height 5' (1.524 m), weight 53.7 kg (118 lb 6.2 oz), SpO2 97.00%. Physical Exam Constitutional: She is oriented to person, place, and time. She is frail appearing.  HENT:  Head: Normocephalic.  Poor dentition, mucosa moist Eyes: EOM are normal.  Neck: Normal range of motion. Neck supple. No thyromegaly present.  Cardiovascular: Normal rate and regular rhythm. No murmur Respiratory: Effort normal and breath sounds normal. No respiratory distress. No wheezes or rales GI:  Soft. Bowel sounds are normal. She exhibits no distension.  Neurological: She is alert and oriented to person, place, and time. HOH Follows commands. Fair insight to her deficits. Left facial weakness and tongue deviation. Speech slurred but intelligible. LUE is 2+/5 deltoid,   3- at bicep and tricep, 3/5 HI. LLE is 3 HF and 3+ KE, Ankle 3-4/5. No gross sensory deficits. Cognitively within normal limits.  Skin: Skin is warm and dry.  Psychiatric: She has a normal mood and affect. Her behavior is normal. Judgment and thought content normal.    Results for orders placed during the hospital encounter of 09/28/13 (from the past 48 hour(s))  COMPREHENSIVE METABOLIC PANEL     Status: Abnormal   Collection Time    09/29/13 12:50 PM      Result Value Range   Sodium 138  135 - 145 mEq/L   Potassium 4.2  3.5 - 5.1 mEq/L   Chloride 102  96 - 112 mEq/L   CO2 22  19 - 32 mEq/L   Glucose, Bld 133 (*) 70 - 99 mg/dL   BUN 11  6 - 23 mg/dL   Creatinine, Ser 0.78  0.50 - 1.10 mg/dL   Calcium 9.0  8.4 - 10.5 mg/dL   Total Protein 6.7  6.0 - 8.3 g/dL   Albumin 3.0 (*) 3.5 - 5.2 g/dL   AST 17  0 - 37 U/L   Comment: HEMOLYSIS AT THIS LEVEL MAY AFFECT RESULT   ALT 7  0 - 35 U/L   Alkaline Phosphatase 77  39 - 117 U/L   Total Bilirubin 0.3  0.3 - 1.2 mg/dL   GFR calc non Af Amer 89 (*) >90 mL/min   GFR calc Af Amer >90  >90 mL/min   Comment: (NOTE)     The eGFR has been calculated using the CKD EPI equation.     This calculation has not been validated in all clinical situations.     eGFR's persistently <90 mL/min signify possible Chronic Kidney     Disease.  CBC     Status: None   Collection Time    09/29/13 12:50 PM      Result Value Range   WBC 4.6  4.0 - 10.5 K/uL   RBC 4.38  3.87 - 5.11   MIL/uL   Hemoglobin 13.0  12.0 - 15.0 g/dL   HCT 39.2  36.0 - 46.0 %   MCV 89.5  78.0 - 100.0 fL   MCH 29.7  26.0 - 34.0 pg   MCHC 33.2  30.0 - 36.0 g/dL   RDW 15.1  11.5 - 15.5 %   Platelets 196  150 - 400  K/uL  CK TOTAL AND CKMB     Status: None   Collection Time    09/29/13 12:50 PM      Result Value Range   Total CK 134  7 - 177 U/L   CK, MB 2.7  0.3 - 4.0 ng/mL   Relative Index 2.0  0.0 - 2.5  URINALYSIS W MICROSCOPIC + REFLEX CULTURE     Status: Abnormal   Collection Time    10/01/13  2:27 AM      Result Value Range   Color, Urine YELLOW  YELLOW   APPearance CLOUDY (*) CLEAR   Specific Gravity, Urine 1.015  1.005 - 1.030   pH 6.5  5.0 - 8.0   Glucose, UA NEGATIVE  NEGATIVE mg/dL   Hgb urine dipstick TRACE (*) NEGATIVE   Bilirubin Urine NEGATIVE  NEGATIVE   Ketones, ur NEGATIVE  NEGATIVE mg/dL   Protein, ur NEGATIVE  NEGATIVE mg/dL   Urobilinogen, UA 1.0  0.0 - 1.0 mg/dL   Nitrite NEGATIVE  NEGATIVE   Leukocytes, UA SMALL (*) NEGATIVE   WBC, UA 3-6  <3 WBC/hpf   RBC / HPF 0-2  <3 RBC/hpf   Bacteria, UA FEW (*) RARE   Squamous Epithelial / LPF FEW (*) RARE   Ct Head Wo Contrast  09/29/2013   CLINICAL DATA:  Frontal headache with nausea and vomiting.  EXAM: CT HEAD WITHOUT CONTRAST  TECHNIQUE: Contiguous axial images were obtained from the base of the skull through the vertex without intravenous contrast.  COMPARISON:  None.  FINDINGS: Acute right lenticular nucleus/caudate/right perioperculum infarct. Progressive mild mass effect upon the right lateral ventricle without midline shift. No associated hemorrhage.  No intracranial mass lesion noted on this unenhanced exam.  IMPRESSION: Acute right lenticular nucleus/caudate/right perioperculum infarct. Progressive mild mass effect upon the right lateral ventricle without midline shift. No associated hemorrhage.   Electronically Signed   By: Steve  Olson M.D.   On: 09/29/2013 11:34    Post Admission Physician Evaluation: 1. Functional deficits secondary  to thrombotic right basal ganglia infarct. 2. Patient is admitted to receive collaborative, interdisciplinary care between the physiatrist, rehab nursing staff, and therapy  team. 3. Patient's level of medical complexity and substantial therapy needs in context of that medical necessity cannot be provided at a lesser intensity of care such as a SNF. 4. Patient has experienced substantial functional loss from his/her baseline which was documented above under the "Functional History" and "Functional Status" headings.  Judging by the patient's diagnosis, physical exam, and functional history, the patient has potential for functional progress which will result in measurable gains while on inpatient rehab.  These gains will be of substantial and practical use upon discharge  in facilitating mobility and self-care at the household level. 5. Physiatrist will provide 24 hour management of medical needs as well as oversight of the therapy plan/treatment and provide guidance as appropriate regarding the interaction of the two. 6. 24 hour rehab nursing will assist with bladder management, bowel management, safety, skin/wound care, disease management, medication administration, pain management and patient education  and help integrate therapy concepts, techniques,education, etc.   7. PT will assess and treat for/with: Lower extremity strength, range of motion, stamina, balance, functional mobility, safety, adaptive techniques and equipment, NMR, education.   Goals are: supervision to mod I. 8. OT will assess and treat for/with: ADL's, functional mobility, safety, upper extremity strength, adaptive techniques and equipment, NMR, education.   Goals are: supervision to mod I. 9. SLP will assess and treat for/with: speech intelligibility, swallowing.  Goals are: mod I. 10. Case Management and Social Worker will assess and treat for psychological issues and discharge planning. 11. Team conference will be held weekly to assess progress toward goals and to determine barriers to discharge. 12. Patient will receive at least 3 hours of therapy per day at least 5 days per week. 13. ELOS: 13-17 days        14. Prognosis:  excellent   Medical Problem List and Plan: 1. Thrombotic right basal ganglia infarction 2. DVT Prophylaxis/Anticoagulation: SCDs. Monitor for any signs of DVT. Moving the leg fairly well. 3. Pain Management: Tylenol as needed 4. Neuropsych: This patient is capable of making decisions on her own behalf. 5. Hypertension. Lisinopril 10 mg daily. Monitor with increased mobility 6. Tobacco abuse. NicoDerm patch. Provide counseling for this as well as secondary stroke prevention 7. Gram-negative rod UTI. Bactrim 09/28/2013--7 day course.  Denies any dysuria or hematuria 8. Hyperlipidemia. Lipitor   Yocelyn Brocious T. Kristina Mcnorton, MD, FAAPMR [facility]   10/01/2013 

## 2013-10-02 DIAGNOSIS — I639 Cerebral infarction, unspecified: Secondary | ICD-10-CM | POA: Diagnosis present

## 2013-10-02 DIAGNOSIS — G8194 Hemiplegia, unspecified affecting left nondominant side: Secondary | ICD-10-CM | POA: Diagnosis present

## 2013-10-02 DIAGNOSIS — F17290 Nicotine dependence, other tobacco product, uncomplicated: Secondary | ICD-10-CM | POA: Diagnosis present

## 2013-10-02 DIAGNOSIS — Z01818 Encounter for other preprocedural examination: Secondary | ICD-10-CM

## 2013-10-02 DIAGNOSIS — N390 Urinary tract infection, site not specified: Secondary | ICD-10-CM | POA: Diagnosis present

## 2013-10-02 LAB — URINE CULTURE

## 2013-10-02 MED ORDER — STROKE: EARLY STAGES OF RECOVERY BOOK
Freq: Once | Status: AC
  Administered 2013-10-02: 1
  Filled 2013-10-02: qty 1

## 2013-10-02 NOTE — Progress Notes (Signed)
10-02-13 1053 Case Manager spoke to pt in reference to insurance. Pt has [employer]- [place]. Pt allowed CM to make a copy of insurance card. Copy was sent to Admitting and to the pre-certification office. No further needs from CM at this time. Gala Lewandowsky

## 2013-10-02 NOTE — Progress Notes (Signed)
Stroke Team Progress Note  HISTORY Erika Wang is a 62 y.o. female with a history of no known medical problems though she does not see a physician on a regular basis. She was in her normal state until today at 11 AM at which point she had sudden onset of left-sided weakness. Therefore 911 was called. On arrival here, she was seen to have a left hemi-plegia with NIH of 9, taken to CT following which she had some improvement and NIH of approximately 4, but still had significant left leg weakness and therefore t-PA was initiated.   Following administration of TPA, the patient had acute worsening of her symptoms and the 2 IVs that she had were both 2 anterior to perform a CT angiogram therefore she was taken for emergent MRI/MRA which did not demonstrate any intervenable lesion.   She thankfully had subsequent significant improvement without any hemiparesis.   LKW: 11am  tpa given?: yes  NIHSS: 9 on arrival, fluctuated widely    She was admitted to the neuro ICU for further evaluation and treatment.  SUBJECTIVE  Patient sittting in bed. Feeling better. Anticipating transfer to CIR when bed available.  Had fall last evening trying to return from bathroom. Patient stable. No actions required. No soreness or pain today.  OBJECTIVE Most recent Vital Signs: Filed Vitals:   10/01/13 2000 10/01/13 2252 10/01/13 2359 10/02/13 0400  BP: 148/81 147/77 154/78 154/88  Pulse: 69 88 70 71  Temp: 98.9 F (37.2 C) 98.2 F (36.8 C) 97.8 F (36.6 C) 98.6 F (37 C)  TempSrc: Oral Oral Oral Oral  Resp: 18 18 18 18   Height:      Weight:   54.885 kg (121 lb)   SpO2: 99% 98% 98% 98%   CBG (last 3)  No results found for this basename: GLUCAP,  in the last 72 hours  IV Fluid Intake:   . sodium chloride Stopped (09/29/13 1327)    MEDICATIONS  . aspirin EC  325 mg Oral Daily  . atorvastatin  10 mg Oral q1800  . lisinopril  10 mg Oral Daily  . nicotine  7 mg Transdermal Daily  . pantoprazole  40 mg  Oral Daily  . sulfamethoxazole-trimethoprim  1 tablet Oral Q12H   PRN:  acetaminophen, acetaminophen, labetalol, ondansetron  Diet:  Dysphagia thin liquids Activity:  Ambulate with assistance DVT Prophylaxis:  SCD  CLINICALLY SIGNIFICANT STUDIES Basic Metabolic Panel:   Recent Labs Lab 09/28/13 1237 09/28/13 1249 09/29/13 1250  NA 137 141 138  K 4.3 4.1 4.2  CL 97 101 102  CO2 26  --  22  GLUCOSE 124* 128* 133*  BUN 12 13 11   CREATININE 0.72 1.00 0.78  CALCIUM 9.9  --  9.0   Liver Function Tests:   Recent Labs Lab 09/28/13 1237 09/29/13 1250  AST 18 17  ALT 9 7  ALKPHOS 90 77  BILITOT 0.2* 0.3  PROT 8.0 6.7  ALBUMIN 3.6 3.0*   CBC:   Recent Labs Lab 09/28/13 1237 09/28/13 1249 09/29/13 1250  WBC 6.0  --  4.6  NEUTROABS 3.8  --   --   HGB 14.5 14.6 13.0  HCT 43.1 43.0 39.2  MCV 89.4  --  89.5  PLT 207  --  196   Coagulation:   Recent Labs Lab 09/28/13 1237  LABPROT 12.5  INR 0.95   Cardiac Enzymes:   Recent Labs Lab 09/28/13 1237 09/29/13 1250  CKTOTAL  --  134  CKMB  --  2.7  TROPONINI <0.30  --    Urinalysis:   Recent Labs Lab 09/28/13 1449 10/01/13 0227  COLORURINE YELLOW YELLOW  LABSPEC 1.016 1.015  PHURINE 7.0 6.5  GLUCOSEU NEGATIVE NEGATIVE  HGBUR MODERATE* TRACE*  BILIRUBINUR NEGATIVE NEGATIVE  KETONESUR NEGATIVE NEGATIVE  PROTEINUR 100* NEGATIVE  UROBILINOGEN 0.2 1.0  NITRITE POSITIVE* NEGATIVE  LEUKOCYTESUR LARGE* SMALL*   Lipid Panel    Component Value Date/Time   CHOL 176 09/29/2013 0415   TRIG 130 09/29/2013 0415   HDL 51 09/29/2013 0415   CHOLHDL 3.5 09/29/2013 0415   VLDL 26 09/29/2013 0415   LDLCALC 99 09/29/2013 0415   HgbA1C  Lab Results  Component Value Date   HGBA1C 5.9* 09/29/2013    Urine Drug Screen:     Component Value Date/Time   LABOPIA NONE DETECTED 09/28/2013 1449   COCAINSCRNUR NONE DETECTED 09/28/2013 1449   LABBENZ NONE DETECTED 09/28/2013 1449   AMPHETMU NONE DETECTED 09/28/2013 1449    THCU NONE DETECTED 09/28/2013 1449   LABBARB NONE DETECTED 09/28/2013 1449    Alcohol Level:   Recent Labs Lab 09/28/13 1237  ETH <11    CT Head Wo Contrast 09/28/2013   No evidence of acute intracranial abnormality.  Nonspecific small vessel ischemic changes in the subcortical right frontal lobe.  09/30/2013 Acute right lenticular nucleus/caudate/right perioperculum infarct. Progressive mild mass effect upon the right lateral ventricle without midline shift. No associated hemorrhage.   Dg Chest Port 1 View 09/28/2013   Cardiomegaly without decompensation    Mr Brain Ltd W/o Cm 09/28/2013   Axial diffusion-weighted imaging demonstrating confluent restricted diffusion affecting a 3.5 cm area of the right basal ganglia. Minimal white matter involvement adjacent to the right caudate nucleus.    Mr Maxine Glenn Head/brain Wo Cm 09/28/2013   1. Anterior circulation is negative except for mild irregularity compatible with atherosclerosis. No right M1 or A1 occlusion or high-grade stenosis. 2. Confluent acute right basal ganglia infarct. 3. 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   2D Echocardiogram  EF 65%, wall motion normal. Atrial septum with borderline criteria for aneurysm  Carotid Doppler  Findings suggest 1-39% internal carotid artery stenosis bilaterally. Vertebral arteries are patent with antegrade flow  EKG  normal sinus rhythm.   Therapy Recommendations CIR  Physical Exam   Patient is awake, alert, oriented to person, place, month, year, and situation.  Immediate and remote memory are intact.  Patient is able to give a clear and coherent history.  No signs of aphasia or neglect  Cranial Nerves:  II: Visual Fields are full. Pupils are equal, round, and reactive to light. Discs are difficult to visualize.  III,IV, VI: EOMI without ptosis or diploplia.  V: Facial sensation is symmetric to temperature  VII: Facial movement is weak on left  VIII: hearing  is intact to voice  X: Uvula elevates symmetrically  XI: Shoulder shrug is symmetric.  XII: tongue is midline without atrophy or fasciculations.  Motor:  Tone is markedly increased on the left. Bulk is normal. 5/5 strength was present on the right, minimal movement of the left arm and leg  Sensory:  Sensation is symmetric to light touch and temperature in the arms and legs.  Deep Tendon Reflexes:  2+ in the biceps and patellae on the right, difficult to elicit on the left due to increased tone  Cerebellar:  FNF and HKS are intact on the right, unable to obtain on the left  Gait:  Not tested due to weakness  ASSESSMENT Erika Wang is a 62 y.o. female presenting with left hemiparesis. Status post IV t-PA 09/28/2013 at 1220. Imaging confirms a right basal ganglia infarct. Infarct felt to be thrombotic secondary to small vessel disease.  On no antithrombotics prior to admission. Now on aspirin 325mg  daily for secondary stroke prevention (patient received tPA). Patient with resultant left hemiparesis.    LDL 99, statin started as patient is at upper end of goal of LDL < 100. I started a statin.  Urinary tract infection: antibiotics sensitive to ecoli on septra.  Smoker, cessation counseling, nicoderm patch  HGB A1C  5.9  Hospital day # 4  TREATMENT/PLAN   Aspirin 325mg  daily started for secondary stroke prevention.  Complete antibiotic course for UTI. (#day5)  Risk factor modification  Smoking cessation, nicoderm patch ordered.  CIR candidate.Patient may be discharged to CIR when bed available.  Follow up with Dr. Pearlean Brownie in 2 months in stroke clinic.  Gwendolyn Lima. Manson Passey, [facility], MBA, MHA [facility] Pager: [phone] 10/02/2013 8:34 AM  I have personally obtained a history, examined the patient, evaluated imaging results, and formulated the assessment and plan of care. I agree with the above.  Aymee Fomby,MD

## 2013-10-02 NOTE — Care Management Note (Signed)
    Page 1 of 1   10/03/2013     10:01:54 AM   CARE MANAGEMENT NOTE 10/03/2013  Patient:  [facility]   Account Number:  [id number]  Date Initiated:  10/02/2013  Documentation initiated by:  GRAVES-BIGELOW,Khang Hannum  Subjective/Objective Assessment:   Pt admitted for Left-sided weakness. MRI:  Confluent acute right basal ganglia infarct.     Action/Plan:   CIR consulted pt and the plan is to verify insurance. Pt showing up as self pay. Pt did have an insurance card and card faxed to admitting. PER Admitting Insurance is Pending. CM did make CIR Genie aware. Will f/u   Anticipated DC Date:  10/03/2013   Anticipated DC Plan:  IP REHAB FACILITY      DC Planning Services  CM consult      Choice offered to / List presented to:             Status of service:  Completed, signed off Medicare Important Message given?   (If response is "NO", the following Medicare IM given date fields will be blank) Date Medicare IM given:   Date Additional Medicare IM given:    Discharge Disposition:  IP REHAB FACILITY  Per UR Regulation:  Reviewed for med. necessity/level of care/duration of stay  If discussed at Long Length of Stay Meetings, dates discussed:    Comments:  10-03-13 [address], [place] [phone] Insurance approval received for CIR. Pt to d/c today. No further needs from CM at this time.

## 2013-10-03 ENCOUNTER — Inpatient Hospital Stay (HOSPITAL_COMMUNITY)
Admission: AD | Admit: 2013-10-03 | Discharge: 2013-10-15 | DRG: 945 | Disposition: A | Discharge status: Home / Self Care | Payer: BC Managed Care – PPO | Source: Intra-hospital | Attending: Physical Medicine & Rehabilitation | Admitting: Physical Medicine & Rehabilitation

## 2013-10-03 DIAGNOSIS — I10 Essential (primary) hypertension: Secondary | ICD-10-CM | POA: Diagnosis present

## 2013-10-03 DIAGNOSIS — Z87891 Personal history of nicotine dependence: Secondary | ICD-10-CM | POA: Diagnosis not present

## 2013-10-03 DIAGNOSIS — N390 Urinary tract infection, site not specified: Secondary | ICD-10-CM | POA: Diagnosis present

## 2013-10-03 DIAGNOSIS — E785 Hyperlipidemia, unspecified: Secondary | ICD-10-CM | POA: Diagnosis present

## 2013-10-03 DIAGNOSIS — Z5189 Encounter for other specified aftercare: Secondary | ICD-10-CM | POA: Diagnosis present

## 2013-10-03 DIAGNOSIS — I7090 Unspecified atherosclerosis: Secondary | ICD-10-CM | POA: Diagnosis present

## 2013-10-03 DIAGNOSIS — N179 Acute kidney failure, unspecified: Secondary | ICD-10-CM

## 2013-10-03 DIAGNOSIS — B9689 Other specified bacterial agents as the cause of diseases classified elsewhere: Secondary | ICD-10-CM | POA: Diagnosis present

## 2013-10-03 DIAGNOSIS — I6330 Cerebral infarction due to thrombosis of unspecified cerebral artery: Secondary | ICD-10-CM

## 2013-10-03 DIAGNOSIS — I639 Cerebral infarction, unspecified: Secondary | ICD-10-CM | POA: Diagnosis present

## 2013-10-03 MED ORDER — ONDANSETRON HCL 4 MG PO TABS: 4.0000 mg | ORAL_TABLET | Freq: Four times a day (QID) | ORAL | Status: DC | PRN

## 2013-10-03 MED ORDER — SULFAMETHOXAZOLE-TMP DS 800-160 MG PO TABS
1.0000 | ORAL_TABLET | Freq: Two times a day (BID) | ORAL | Status: DC
  Administered 2013-10-03 – 2013-10-05 (×5): 1 via ORAL
  Filled 2013-10-03 (×9): qty 1

## 2013-10-03 MED ORDER — NICOTINE 7 MG/24HR TD PT24
7.0000 mg | MEDICATED_PATCH | Freq: Every day | TRANSDERMAL | Status: DC
  Administered 2013-10-04 – 2013-10-15 (×12): 7 mg via TRANSDERMAL
  Filled 2013-10-03 (×13): qty 1

## 2013-10-03 MED ORDER — ONDANSETRON HCL 4 MG/2ML IJ SOLN
4.0000 mg | Freq: Four times a day (QID) | INTRAMUSCULAR | Status: DC | PRN
  Administered 2013-10-06: 4 mg via INTRAVENOUS
  Filled 2013-10-03: qty 2

## 2013-10-03 MED ORDER — ACETAMINOPHEN 650 MG RE SUPP: 650.0000 mg | RECTAL | Status: DC | PRN

## 2013-10-03 MED ORDER — ATORVASTATIN CALCIUM 10 MG PO TABS
10.0000 mg | ORAL_TABLET | Freq: Every day | ORAL | Status: DC
  Administered 2013-10-03 – 2013-10-14 (×11): 10 mg via ORAL
  Filled 2013-10-03 (×13): qty 1

## 2013-10-03 MED ORDER — LISINOPRIL 10 MG PO TABS
10.0000 mg | ORAL_TABLET | Freq: Every day | ORAL | Status: DC
  Administered 2013-10-04 – 2013-10-05 (×2): 10 mg via ORAL
  Filled 2013-10-03 (×4): qty 1

## 2013-10-03 MED ORDER — PANTOPRAZOLE SODIUM 40 MG PO TBEC
40.0000 mg | DELAYED_RELEASE_TABLET | Freq: Every day | ORAL | Status: DC
  Administered 2013-10-04 – 2013-10-15 (×12): 40 mg via ORAL
  Filled 2013-10-03 (×14): qty 1

## 2013-10-03 MED ORDER — SORBITOL 70 % SOLN: 30.0000 mL | Freq: Every day | Status: DC | PRN

## 2013-10-03 MED ORDER — ACETAMINOPHEN 325 MG PO TABS: 650.0000 mg | ORAL_TABLET | ORAL | Status: DC | PRN

## 2013-10-03 MED ORDER — ASPIRIN EC 325 MG PO TBEC
325.0000 mg | DELAYED_RELEASE_TABLET | Freq: Every day | ORAL | Status: DC
  Administered 2013-10-04 – 2013-10-15 (×12): 325 mg via ORAL
  Filled 2013-10-03 (×13): qty 1

## 2013-10-03 NOTE — Progress Notes (Signed)
Stroke Team Progress Note  HISTORY Erika Wang is a 62 y.o. female with a history of no known medical problems though she does not see a physician on a regular basis. She was in her normal state until today at 11 AM at which point she had sudden onset of left-sided weakness. Therefore 911 was called. On arrival here, she was seen to have a left hemi-plegia with NIH of 9, taken to CT following which she had some improvement and NIH of approximately 4, but still had significant left leg weakness and therefore t-PA was initiated.   Following administration of TPA, the patient had acute worsening of her symptoms and the 2 IVs that she had were both 2 anterior to perform a CT angiogram therefore she was taken for emergent MRI/MRA which did not demonstrate any intervenable lesion.   She thankfully had subsequent significant improvement without any hemiparesis.   LKW: 11am  tpa given?: yes  NIHSS: 9 on arrival, fluctuated widely    She was admitted to the neuro ICU for further evaluation and treatment.  SUBJECTIVE  Awaiting CIR bed. No new symptoms.  OBJECTIVE Most recent Vital Signs: Filed Vitals:   10/02/13 1633 10/02/13 1944 10/02/13 2348 10/03/13 0400  BP: 132/79 154/83 99/67 107/55  Pulse: 68 70 87 74  Temp: 97.6 F (36.4 C) 98.2 F (36.8 C) 98.2 F (36.8 C) 98.2 F (36.8 C)  TempSrc: Oral Oral Oral Oral  Resp: 18 18 18 18   Height:      Weight:      SpO2: 100% 98% 98% 95%   CBG (last 3)  No results found for this basename: GLUCAP,  in the last 72 hours  IV Fluid Intake:   . sodium chloride Stopped (09/29/13 1327)    MEDICATIONS  . aspirin EC  325 mg Oral Daily  . atorvastatin  10 mg Oral q1800  . lisinopril  10 mg Oral Daily  . nicotine  7 mg Transdermal Daily  . pantoprazole  40 mg Oral Daily  . sulfamethoxazole-trimethoprim  1 tablet Oral Q12H   PRN:  acetaminophen, acetaminophen, labetalol, ondansetron  Diet:  Dysphagia thin liquids Activity:  Ambulate with  assistance DVT Prophylaxis:  SCD  CLINICALLY SIGNIFICANT STUDIES Basic Metabolic Panel:   Recent Labs Lab 09/28/13 1237 09/28/13 1249 09/29/13 1250  NA 137 141 138  K 4.3 4.1 4.2  CL 97 101 102  CO2 26  --  22  GLUCOSE 124* 128* 133*  BUN 12 13 11   CREATININE 0.72 1.00 0.78  CALCIUM 9.9  --  9.0   Liver Function Tests:   Recent Labs Lab 09/28/13 1237 09/29/13 1250  AST 18 17  ALT 9 7  ALKPHOS 90 77  BILITOT 0.2* 0.3  PROT 8.0 6.7  ALBUMIN 3.6 3.0*   CBC:   Recent Labs Lab 09/28/13 1237 09/28/13 1249 09/29/13 1250  WBC 6.0  --  4.6  NEUTROABS 3.8  --   --   HGB 14.5 14.6 13.0  HCT 43.1 43.0 39.2  MCV 89.4  --  89.5  PLT 207  --  196   Coagulation:   Recent Labs Lab 09/28/13 1237  LABPROT 12.5  INR 0.95   Cardiac Enzymes:   Recent Labs Lab 09/28/13 1237 09/29/13 1250  CKTOTAL  --  134  CKMB  --  2.7  TROPONINI <0.30  --    Urinalysis:   Recent Labs Lab 09/28/13 1449 10/01/13 0227  COLORURINE YELLOW YELLOW  LABSPEC 1.016 1.015  PHURINE 7.0 6.5  GLUCOSEU NEGATIVE NEGATIVE  HGBUR MODERATE* TRACE*  BILIRUBINUR NEGATIVE NEGATIVE  KETONESUR NEGATIVE NEGATIVE  PROTEINUR 100* NEGATIVE  UROBILINOGEN 0.2 1.0  NITRITE POSITIVE* NEGATIVE  LEUKOCYTESUR LARGE* SMALL*   Lipid Panel    Component Value Date/Time   CHOL 176 09/29/2013 0415   TRIG 130 09/29/2013 0415   HDL 51 09/29/2013 0415   CHOLHDL 3.5 09/29/2013 0415   VLDL 26 09/29/2013 0415   LDLCALC 99 09/29/2013 0415   HgbA1C  Lab Results  Component Value Date   HGBA1C 5.9* 09/29/2013    Urine Drug Screen:     Component Value Date/Time   LABOPIA NONE DETECTED 09/28/2013 1449   COCAINSCRNUR NONE DETECTED 09/28/2013 1449   LABBENZ NONE DETECTED 09/28/2013 1449   AMPHETMU NONE DETECTED 09/28/2013 1449   THCU NONE DETECTED 09/28/2013 1449   LABBARB NONE DETECTED 09/28/2013 1449    Alcohol Level:   Recent Labs Lab 09/28/13 1237  ETH <11    CT Head Wo Contrast 09/28/2013   No  evidence of acute intracranial abnormality.  Nonspecific small vessel ischemic changes in the subcortical right frontal lobe.  09/30/2013 Acute right lenticular nucleus/caudate/right perioperculum infarct. Progressive mild mass effect upon the right lateral ventricle without midline shift. No associated hemorrhage.   Dg Chest Port 1 View 09/28/2013   Cardiomegaly without decompensation    Mr Brain Ltd W/o Cm 09/28/2013   Axial diffusion-weighted imaging demonstrating confluent restricted diffusion affecting a 3.5 cm area of the right basal ganglia. Minimal white matter involvement adjacent to the right caudate nucleus.    Mr Maxine Glenn Head/brain Wo Cm 09/28/2013   1. Anterior circulation is negative except for mild irregularity compatible with atherosclerosis. No right M1 or A1 occlusion or high-grade stenosis. 2. Confluent acute right basal ganglia infarct. 3. 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   2D Echocardiogram  EF 65%, wall motion normal. Atrial septum with borderline criteria for aneurysm  Carotid Doppler  Findings suggest 1-39% internal carotid artery stenosis bilaterally. Vertebral arteries are patent with antegrade flow  EKG  normal sinus rhythm.   Therapy Recommendations CIR  Physical Exam   Patient is awake, alert, oriented to person, place, month, year, and situation.  Immediate and remote memory are intact.  Patient is able to give a clear and coherent history.  No signs of aphasia or neglect  Cranial Nerves:  II: Visual Fields are full. Pupils are equal, round, and reactive to light. Discs are difficult to visualize.  III,IV, VI: EOMI without ptosis or diploplia.  V: Facial sensation is symmetric to temperature  VII: Facial movement is weak on left  VIII: hearing is intact to voice  X: Uvula elevates symmetrically  XI: Shoulder shrug is symmetric.  XII: tongue is midline without atrophy or fasciculations.  Motor:  Tone is markedly increased  on the left. Bulk is normal. 5/5 strength was present on the right, minimal movement of the left arm and leg  Sensory:  Sensation is symmetric to light touch and temperature in the arms and legs.  Deep Tendon Reflexes:  2+ in the biceps and patellae on the right, difficult to elicit on the left due to increased tone  Cerebellar:  FNF and HKS are intact on the right, unable to obtain on the left  Gait:  Not tested due to weakness   ASSESSMENT Ms. Quintana Canelo is a 62 y.o. female presenting with left hemiparesis. Status post IV t-PA 09/28/2013 at 1220. Imaging confirms a right basal ganglia infarct.  Infarct felt to be thrombotic secondary to small vessel disease.  On no antithrombotics prior to admission. Now on aspirin 325mg  daily for secondary stroke prevention (patient received tPA). Patient with resultant left hemiparesis.    LDL 99, statin started as patient is at upper end of goal of LDL < 100. I started a statin.  Urinary tract infection: antibiotics sensitive to ecoli on septra.  Smoker, cessation counseling, nicoderm patch  HGB A1C  5.9  Hospital day # 5  TREATMENT/PLAN   Aspirin 325mg  daily started for secondary stroke prevention.  Complete antibiotic course for UTI. (#day6)  Risk factor modification  Smoking cessation, nicoderm patch ordered.  Patient may be discharged to CIR when bed available.  Follow up with Dr. Pearlean Brownie in 2 months in stroke clinic.  Gwendolyn Lima. Manson Passey, [facility], MBA, MHA [facility] Pager: [phone] 10/03/2013 7:56 AM  I have personally obtained a history, examined the patient, evaluated imaging results, and formulated the assessment and plan of care. I agree with the above. Delia Heady, MD

## 2013-10-03 NOTE — Discharge Summary (Signed)
Stroke Discharge Summary  Patient ID: Erika Wang   MRN: [id number]      DOB: 1950-10-31  Date of Admission: 09/28/2013 Date of Discharge: 10/03/2013  Attending Physician:  Darcella Cheshire, MD, Stroke MD  Consulting Physician(s):  Treatment Team:  Md Stroke, MD None  Patient's PCP:  No primary provider on file.  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  History reviewed. No pertinent past medical history. Past Surgical History  Procedure Laterality Date  . Cesarean section      Medications to be continued on Rehab . aspirin EC  325 mg Oral Daily  . atorvastatin  10 mg Oral q1800  . lisinopril  10 mg Oral Daily  . nicotine  7 mg Transdermal Daily  . pantoprazole  40 mg Oral Daily  . sulfamethoxazole-trimethoprim  1 tablet Oral Q12H    LABORATORY STUDIES CBC    Component Value Date/Time   WBC 4.6 09/29/2013 1250   RBC 4.38 09/29/2013 1250   HGB 13.0 09/29/2013 1250   HCT 39.2 09/29/2013 1250   PLT 196 09/29/2013 1250   MCV 89.5 09/29/2013 1250   MCH 29.7 09/29/2013 1250   MCHC 33.2 09/29/2013 1250   RDW 15.1 09/29/2013 1250   LYMPHSABS 1.6 09/28/2013 1237   MONOABS 0.5 09/28/2013 1237   EOSABS 0.0 09/28/2013 1237   BASOSABS 0.0 09/28/2013 1237   CMP    Component Value Date/Time   NA 138 09/29/2013 1250   K 4.2 09/29/2013 1250   CL 102 09/29/2013 1250   CO2 22 09/29/2013 1250   GLUCOSE 133* 09/29/2013 1250   BUN 11 09/29/2013 1250   CREATININE 0.78 09/29/2013 1250   CALCIUM 9.0 09/29/2013 1250   PROT 6.7 09/29/2013 1250   ALBUMIN 3.0* 09/29/2013 1250   AST 17 09/29/2013 1250   ALT 7 09/29/2013 1250   ALKPHOS 77 09/29/2013 1250   BILITOT 0.3 09/29/2013 1250   GFRNONAA 89* 09/29/2013  1250   GFRAA >90 09/29/2013 1250   COAGS Lab Results  Component Value Date   INR 0.95 09/28/2013   Lipid Panel    Component Value Date/Time   CHOL 176 09/29/2013 0415   TRIG 130 09/29/2013 0415   HDL 51 09/29/2013 0415   CHOLHDL 3.5 09/29/2013 0415   VLDL 26 09/29/2013 0415   LDLCALC 99 09/29/2013 0415   HgbA1C  Lab Results  Component Value Date   HGBA1C 5.9* 09/29/2013   Cardiac Panel (last 3 results) No results found for this basename: CKTOTAL, CKMB, TROPONINI, RELINDX,  in the last 72 hours Urinalysis    Component Value Date/Time   COLORURINE YELLOW 10/01/2013 0227   APPEARANCEUR CLOUDY* 10/01/2013 0227   LABSPEC 1.015 10/01/2013 0227   PHURINE 6.5 10/01/2013 0227   GLUCOSEU NEGATIVE 10/01/2013 0227   HGBUR TRACE* 10/01/2013 0227   BILIRUBINUR NEGATIVE 10/01/2013 0227   KETONESUR NEGATIVE 10/01/2013 0227   PROTEINUR NEGATIVE 10/01/2013 0227   UROBILINOGEN 1.0 10/01/2013 0227   NITRITE NEGATIVE 10/01/2013 0227   LEUKOCYTESUR SMALL* 10/01/2013 0227   Urine Drug Screen     Component Value Date/Time   LABOPIA NONE DETECTED 09/28/2013 1449   COCAINSCRNUR NONE DETECTED 09/28/2013 1449  LABBENZ NONE DETECTED 09/28/2013 1449   AMPHETMU NONE DETECTED 09/28/2013 1449   THCU NONE DETECTED 09/28/2013 1449   LABBARB NONE DETECTED 09/28/2013 1449    Alcohol Level    Component Value Date/Time   ETH <11 09/28/2013 1237     SIGNIFICANT DIAGNOSTIC STUDIES  CT Head Wo Contrast  09/28/2013 No evidence of acute intracranial abnormality. Nonspecific small vessel ischemic changes in the subcortical right frontal lobe.  09/30/2013 Acute right lenticular nucleus/caudate/right perioperculum infarct. Progressive mild mass effect upon the right lateral ventricle without midline shift. No associated hemorrhage.  Dg Chest Port 1 View  09/28/2013 Cardiomegaly without decompensation  Mr Brain Ltd W/o Cm  09/28/2013 Axial diffusion-weighted imaging demonstrating confluent restricted diffusion  affecting a 3.5 cm area of the right basal ganglia. Minimal white matter involvement adjacent to the right caudate nucleus.  Mr Maxine Glenn Head/brain Wo Cm  09/28/2013 1. Anterior circulation is negative except for mild irregularity compatible with atherosclerosis. No right M1 or A1 occlusion or high-grade stenosis. 2. Confluent acute right basal ganglia infarct. 3. 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  2D Echocardiogram EF 65%, wall motion normal. Atrial septum with borderline criteria for aneurysm  Carotid Doppler Findings suggest 1-39% internal carotid artery stenosis bilaterally. Vertebral arteries are patent with antegrade flow       History of Present Illness    Erika Wang is a 62 y.o. female with a history of no known medical problems though she does not see a physician on a regular basis. She was in her normal state until11 AM on 09/28/2013 at which point she had sudden onset of left-sided weakness. Therefore 911 was called. On arrival to this facililty, she was seen to have a left hemi-plegia with NIH of 9, taken to CT following which she had some improvement and NIH of approximately 4, but still had significant left leg weakness and therefore t-PA was initiated.   Following administration of TPA, the patient had acute worsening of her symptoms and the 2 IVs that she had were both 2 anterior to perform a CT angiogram therefore she was taken for emergent MRI/MRA which did not demonstrate any intervenable lesion.  She thankfully had subsequent significant improvement without any hemiparesis.   LKW: 11am  tpa given?: yes  NIHSS: 9 on arrival, fluctuated widely   She was admitted to the neuro ICU for further evaluation and treatment.     Hospital Course   Erika Wang is a 62 y.o. female presenting with left hemiparesis. Status post IV t-PA 09/28/2013 at 1220. Imaging confirms a right basal ganglia infarct. Infarct felt to be thrombotic secondary to small  vessel disease. On no antithrombotics prior to admission. Now on aspirin 325mg  daily for secondary stroke prevention (patient received tPA). Patient with resultant left hemiparesis.  LDL 99, statin started as patient is at upper end of goal of LDL < 100.  Urinary tract infection: antibiotics sensitive to ecoli on septra.  Smoker, cessation counseling, nicoderm patch  HGB A1C 5.9 Hospital day # 5  TREATMENT/PLAN  Aspirin 325mg  daily started for secondary stroke prevention.  Complete antibiotic course for UTI. (#day6) Risk factor modification:  Request that patient follow up with a primary care provider 2 weeks after discharge for long term management. Smoking cessation, nicoderm patch ordered.  Patient going to be discharged to inpatient rehab for intense therapy.  Follow up with Dr. Pearlean Brownie in 2 months in stroke clinic.    Patient with vascular risk factors of:   Smoking  Stroke   Patient has resultant left hemiparesis. Physical therapy, occupational therapy and speech therapy evaluated patient. All agreed inpatient rehab is needed. Patient's family is/are supportive and can provide care at discharge. CIR bed is available today and patient will be transferred there.  Discharge Exam  Blood pressure 107/62, pulse 74, temperature 97.5 F (36.4 C), temperature source Oral, resp. rate 18, height 5' (1.524 m), weight 54.885 kg (121 lb), SpO2 96.00%.   Physical Exam  Patient is awake, alert, oriented to person, place, month, year, and situation.  Immediate and remote memory are intact.  Patient is able to give a clear and coherent history.  No signs of aphasia or neglect  Cranial Nerves:  II: Visual Fields are full. Pupils are equal, round, and reactive to light. Discs are difficult to visualize.  III,IV, VI: EOMI without ptosis or diploplia.  V: Facial sensation is symmetric to temperature  VII: Facial movement is weak on left  VIII: hearing is intact to voice  X: Uvula elevates  symmetrically  XI: Shoulder shrug is symmetric.  XII: tongue is midline without atrophy or fasciculations.  Motor:  Tone is markedly increased on the left. Bulk is normal. 5/5 strength was present on the right, minimal movement of the left arm and leg  Sensory:  Sensation is symmetric to light touch and temperature in the arms and legs.  Deep Tendon Reflexes:  2+ in the biceps and patellae on the right, difficult to elicit on the left due to increased tone  Cerebellar:  FNF and HKS are intact on the right, unable to obtain on the left  Gait:  Not tested due to weakness   Discharge Diet  Dysphagia 3 liquids (chopped meats)  Discharge Plan  Disposition:  Transfer to [facility] for ongoing PT, OT and ST  aspirin 325 mg orally every day for secondary stroke prevention.  Ongoing risk factor control by Primary Care Physician. Risk factor recommendations:  Hypertension target range 130-140/70-80 Lipid range - LDL < 100 and checked every 6 months, fasting Diabetes - HgB A1C <7 Smoking cessation   Follow-up with primary provider 2 weeks post discharge from rehab  Follow-up with Dr. Delia Heady, Stroke Clinic in 2 months.  45 minutes were spent preparing discharge.  Signed Gwendolyn Lima. Manson Passey, [facility], MBA, MHA [facility] Pager: [phone] 10/03/2013 11:20 AM  I have personally examined this patient, reviewed pertinent data and developed the plan of care. I agree with above.  Delia Heady, MD

## 2013-10-03 NOTE — PMR Pre-admission (Signed)
PMR Admission Coordinator Pre-Admission Assessment  Patient: Erika Wang is an 62 y.o., female MRN: [id number] DOB: 1951-05-07 Height: 5' (152.4 cm) Weight: 54.885 kg (121 lb)              Insurance Information HMO:      PPO:       PCP:       IPA:       80/20:       OTHER:   PRIMARY: [employer] [place]      Policy#: [id number]      Subscriber: [name] CM Name: [name]      Phone#:       Fax#: [phone] Pre-Cert#: [id number] precert X 7 days with update due 10/10/13      Employer: [name], but self insured Benefits:  Phone #: [phone]     Name: [name]. Date: 12/28/12     Deduct: $0      Out of Pocket Max: $2250      Life Max: unlimited CIR: 95%      SNF: 95%  60 days max Outpatient: 95%    30 visits max    Co-Pay: 5% Home Health: 95%  60 visits max    Co-Pay: 5% DME: 95%     Co-Pay: 5% Providers: in network  Emergency Contact Information Contact Information   Name Relation Home Work Mobile   Stout Daughter [phone]       Current Medical History  Patient Admitting Diagnosis: R BG infarct   History of Present Illness: A 62 y.o. right-handed female with history of tobacco abuse and no scheduled medications at time of admission. Admitted 09/28/2013 with left-sided weakness. Patient independent prior to admission living alone in [facility] [place] and was visiting her daughter in [place]. MRI showed right basal ganglia infarct. MRA of the head with anterior circulation negative except for some mild irregularity compatible with atherosclerosis. Echocardiogram with ejection fraction of 70% no wall motion abnormalities. Carotid Dopplers with no ICA stenosis. Neurology services consulted patient did receive TPA. Placed on aspirin for CVA prophylaxis. Patient is maintained on a regular diet. Blood pressure monitored with the addition of lisinopril. NicoDerm patch added for history of tobacco abuse. Urine study 09/28/2013 greater than 100,000 gram-negative rods  and placed on Bactrim. Physical and occupational therapy evaluations completed 09/29/2013 with recommendations for physical medicine rehabilitation consult to consider inpatient rehabilitation services. Patient was felt to be a good candidate for inpatient rehabilitation services and will be admitted for comprehensive rehabilitation program.    Total: 6=NIH  Past Medical History  History reviewed. No pertinent past medical history.  Family History  family history is not on file.  Prior Rehab/Hospitalizations:  None   Current Medications  Current facility-administered medications:0.9 %  sodium chloride infusion, , Intravenous, Continuous, Ritta Slot, MD;  acetaminophen (TYLENOL) suppository 650 mg, 650 mg, Rectal, Q4H PRN, Ritta Slot, MD;  acetaminophen (TYLENOL) tablet 650 mg, 650 mg, Oral, Q4H PRN, Ritta Slot, MD;  aspirin EC tablet 325 mg, 325 mg, Oral, Daily, Cathlyn Parsons, PA-C, 325 mg at 10/03/13 0950 atorvastatin (LIPITOR) tablet 10 mg, 10 mg, Oral, q1800, Cathlyn Parsons, PA-C, 10 mg at 10/02/13 1035;  labetalol (NORMODYNE,TRANDATE) injection 10 mg, 10 mg, Intravenous, Q10 min PRN, Ritta Slot, MD, 10 mg at 09/29/13 1813;  lisinopril (PRINIVIL,ZESTRIL) tablet 10 mg, 10 mg, Oral, Daily, Cathlyn Parsons, PA-C, 10 mg at 10/03/13 0950 nicotine (NICODERM CQ - dosed in mg/24 hr) patch 7 mg, 7 mg, Transdermal,  Daily, Cathlyn Parsons, PA-C, 7 mg at 10/03/13 0950;  ondansetron [facility]) injection 4 mg, 4 mg, Intravenous, Q6H PRN, Noel Christmas, 4 mg at 09/29/13 2306;  pantoprazole (PROTONIX) EC tablet 40 mg, 40 mg, Oral, Daily, Ritta Slot, MD, 40 mg at 10/03/13 0950 sulfamethoxazole-trimethoprim (BACTRIM DS) 800-160 MG per tablet 1 tablet, 1 tablet, Oral, Q12H, Ritta Slot, MD, 1 tablet at 10/03/13 8119  Patients Current Diet: Dysphagia  Precautions / Restrictions Precautions Precautions: Fall Precaution Comments: L hemiparesis, left shoulder  pain Restrictions Weight Bearing Restrictions: No   Prior Activity Level Community (5-7x/wk): Went out daily.  Worked FT in [employer] at [employer].  Home Assistive Devices / Equipment Home Assistive Devices/Equipment: None  Prior Functional Level Prior Function Level of Independence: Independent Comments: daughter has 80 week old baby  Current Functional Level Cognition  Arousal/Alertness: Awake/alert Overall Cognitive Status: Impaired/Different from baseline Current Attention Level: Sustained Orientation Level: Oriented X4 Safety/Judgement: Decreased awareness of safety General Comments: Pt found in bed with HOB at 33 degrees.  She was not aware of her swallowing precautions posted above the bed and that the [facility] needed to be elevated to 90 degrees. Attention: Selective Selective Attention: Impaired Selective Attention Impairment: Verbal basic;Functional basic Memory: Impaired Memory Impairment: Storage deficit;Retrieval deficit;Decreased recall of new information;Prospective memory Awareness: Impaired Problem Solving: Impaired Problem Solving Impairment: Verbal basic;Functional basic Behaviors: Impulsive;Perseveration Safety/Judgment: Impaired    Extremity Assessment (includes Sensation/Coordination)          ADLs  Eating/Feeding: Other (comment) (modified diet) Grooming: Moderate assistance Where Assessed - Grooming: Supported standing Upper Body Bathing: Minimal assistance Where Assessed - Upper Body Bathing: Supported sitting Lower Body Bathing: Moderate assistance Where Assessed - Lower Body Bathing: Supported sit to stand Upper Body Dressing: Moderate assistance Where Assessed - Upper Body Dressing: Supported sitting Lower Body Dressing: Performed;Minimal assistance (to donn gripper socks) Where Assessed - Lower Body Dressing: Unsupported sitting Toilet Transfer: Performed;Moderate assistance Toilet Transfer Method: Other (comment) (ambulate with hand held  assist) Toilet Transfer Equipment: Comfort height toilet;Grab bars Toileting - Clothing Manipulation and Hygiene: Performed;Maximal assistance Where Assessed - Toileting Clothing Manipulation and Hygiene: Sit to stand from 3-in-1 or toilet Equipment Used: Gait belt Transfers/Ambulation Related to ADLs: Pt needed mod facilitation for mobility with hand held assist on the left side. ADL Comments: Pt able to donn socks sitting EOB but primarily using just the RUE.  Ambulated with hand held assist to the bathroom with max instructional cueing to take a larger step on the right side and maintain left knee extension and upright trunk posture.  Pt stood at the sink for 5 mins and worked on using the LUE to wash the counter and dry it.  Min faciliation to avoid compensatory strategies of bending forward with her trunk before reaching full elbow extension.      Mobility  Bed Mobility: Rolling Right;Right Sidelying to Sit Rolling Right: 4: Min assist Right Sidelying to Sit: 4: Min assist;HOB flat;With rails Supine to Sit: 4: Min assist;HOB elevated;With rails Sitting - Scoot to Edge of Bed: 4: Min assist;With rail    Transfers  Transfers: Stand to [employer] to Stand: 4: Min assist;With upper extremity assist;From bed;From toilet Sit to Stand: Patient Percentage: 60% Stand to Sit: 4: Min assist;To bed;Without upper extremity assist Stand to Sit: Patient Percentage: 60% Stand Pivot Transfers: 1: +2 Total assist Stand Pivot Transfers: Patient Percentage: 60%    Ambulation / Gait / Stairs / Wheelchair Mobility  Ambulation/Gait Ambulation/Gait Assistance: 1: +2  Total assist;3: Mod assist Ambulation/Gait: Patient Percentage: 70% Ambulation Distance (Feet): 100 Feet Assistive device: 2 person hand held assist;None Ambulation/Gait Assistance Details: started out two person hand held assit with pt leaning to the left and pushing with the right hand.  With manual cues and verbal cues pt was able to push less  with right hand and center her body more over her feet.  Cues and at times manual assist needed to help with left leg progression as she fatigues.  Mod one person assist to walk from door in room to bed, pt reaching with her free right hand for support on furniture in room.   Gait Pattern: Step-to pattern;Decreased dorsiflexion - left;Decreased hip/knee flexion - left;Left flexed knee in stance;Lateral trunk lean to left Gait velocity: decreased General Gait Details: pushing to the left.  Stairs: No    Posture / Balance Static Sitting Balance Static Sitting - Balance Support: Bilateral upper extremity supported;Feet supported Static Sitting - Level of Assistance: 5: Stand by assistance Static Sitting - Comment/# of Minutes: pt began vommiting upon sitting up EOB, RN notified. pt sat EOB x Static Standing Balance Static Standing - Balance Support: Left upper extremity supported;Right upper extremity supported Static Standing - Level of Assistance: 3: Mod assist Dynamic Standing Balance Dynamic Standing - Balance Support: Left upper extremity supported;Right upper extremity supported Dynamic Standing - Level of Assistance: 3: Mod assist    Special needs/care consideration BiPAP/CPAP No CPM No Continuous Drip IV 0.9% NS 75 ml/hr Dialysis No      Life Vest No Oxygen No Special Bed No Trach Size No Wound Vac (area) No      Skin Has large bruised area on right forearm                               Bowel mgmt: Had BM 10/03/13 Bladder mgmt: Voiding on BSC Diabetic mgmt No    Previous Home Environment Living Arrangements: Alone  Lives With: Alone Available Help at Discharge: Family Home Care Services: No Additional Comments: was independent and working at [employer]TA; lives in [facility]  Discharge Living Setting Plans for Discharge Living Setting: Patient's home;Alone;House Type of Home at Discharge: House Discharge Home Layout: One level Discharge Home Access: Stairs to  enter Entrance Stairs-Number of Steps: 2 Does the patient have any problems obtaining your medications?: No  Social/Family/Support Systems Patient Roles: Parent (Has 2 daughters.) Contact Information: Rosendo Gros - daughter [phone] Anticipated Caregiver: self Ability/Limitations of Caregiver: Dtr lives in [place] and another daughter lives out of state Caregiver Availability: Other (Comment) (Hopes to discharge to her home in [facility] mod I goals.) Discharge Plan Discussed with Primary Caregiver: Yes Is Caregiver In Agreement with Plan?: Yes Does Caregiver/Family have Issues with Lodging/Transportation while Pt is in Rehab?: No  Goals/Additional Needs Patient/Family Goal for Rehab: PT/OT/ST mod I goals Expected length of stay: 11-15 days Cultural Considerations: None Dietary Needs: Dys 3, thin liquids Equipment Needs: TBD Pt/Family Agrees to Admission and willing to participate: Yes Program Orientation Provided & Reviewed with Pt/Caregiver Including Roles  & Responsibilities: Yes   Decrease burden of Care through IP rehab admission: N/A  Possible need for SNF placement upon discharge: Not planned   Patient Condition: This patient's medical and functional status has changed since the consult dated: 09/30/13 in which the Rehabilitation Physician determined and documented that the patient's condition is appropriate for intensive rehabilitative care in an inpatient rehabilitation facility.  See "History of Present Illness" (above) for medical update. Functional changes are: Currently total assist +2 70% 100 ft for ambulation. Patient's medical and functional status update has been discussed with the Rehabilitation physician and patient remains appropriate for inpatient rehabilitation. Will admit to inpatient rehab today.  Preadmission Screen Completed By:  Trish Mage, 10/03/2013 10:24 AM ______________________________________________________________________   Discussed status with  Dr. Riley Kill on 10/03/13 at 1032 and received telephone approval for admission today.  Admission Coordinator:  Trish Mage, time1032/Date12/05/14

## 2013-10-03 NOTE — Progress Notes (Signed)
Rehab admissions - I met with patient yesterday and again this am.  I spoke to her daughter by phone.  I have approval from [facility] for acute inpatient rehab admission for today.  Bed available and patient/daughter are in agreement to inpatient rehab admission.  Will admit to inpatient rehab today.  Call me for questions.  #[phone]

## 2013-10-03 NOTE — Interval H&P Note (Signed)
Erika Wang was admitted today to Inpatient Rehabilitation with the diagnosis of right basal ganglia infarct.  The patient's history has been reviewed, patient examined, and there is no change in status.  Patient continues to be appropriate for intensive inpatient rehabilitation.  I have reviewed the patient's chart and labs.  Questions were answered to the patient's satisfaction.  SWARTZ,ZACHARY T 10/03/2013, 4:32 PM

## 2013-10-03 NOTE — H&P (View-Only) (Signed)
Physical Medicine and Rehabilitation Admission H&P    Chief Complaint  Patient presents with  . Code Stroke  :  Chief complaint: Left-sided weakness  HPI: Erika Wang is a 62 y.o. right-handed female with history of tobacco abuse and no scheduled medications at time of admission. Admitted 09/28/2013 with left-sided weakness. Patient independent prior to admission living alone in [facility] and was visiting her daughter in [place]. MRI showed right basal ganglia infarct. MRA of the head with anterior circulation negative except for some mild irregularity compatible with atherosclerosis. Echocardiogram with ejection fraction of 70% no wall motion abnormalities. Carotid Dopplers with no ICA stenosis. Neurology services consulted patient did receive TPA. Placed on aspirin for CVA prophylaxis. Patient is maintained on a regular diet. Blood pressure monitored with the addition of lisinopril. NicoDerm patch added for history of tobacco abuse. Urine study 09/28/2013 greater than 100,000 gram-negative rods and placed on Bactrim. Physical and occupational therapy evaluations completed 09/29/2013 with recommendations for physical medicine rehabilitation consult to consider inpatient rehabilitation services. Patient was felt to be a good candidate for inpatient rehabilitation services and was admitted for comprehensive rehabilitation program   ROS Review of Systems  Respiratory: Positive for cough.  Musculoskeletal: Positive for myalgias.  All other systems reviewed and are negative.  History reviewed. No pertinent past medical history. Past Surgical History  Procedure Laterality Date  . Cesarean section     History reviewed. No pertinent family history. Social History:  reports that she has been smoking.  She does not have any smokeless tobacco history on file. Her alcohol and drug histories are not on file. Allergies: No Known Allergies No prescriptions prior to admission     Home: Home Living Family/patient expects to be discharged to:: Inpatient rehab Living Arrangements: Alone Available Help at Discharge: Family Additional Comments: was independent and working at [employer]; lives in [facility]  Lives With: Alone   Functional History: Prior Function Vocation: Full time employment Comments: daughter has 54 week old baby  Functional Status:  Mobility: Bed Mobility Bed Mobility: Supine to Sit;Sitting - Scoot to Edge of Bed Supine to Sit: 4: Min assist;HOB elevated;With rails Sitting - Scoot to Edge of Bed: 4: Min assist;With rail Transfers Transfers: Sit to Stand;Stand to Sit Sit to Stand: 3: Mod assist;From bed Sit to Stand: Patient Percentage: 60% Stand to Sit: 3: Mod assist;To chair/3-in-1 Stand to Sit: Patient Percentage: 60% Stand Pivot Transfers: 1: +2 Total assist Stand Pivot Transfers: Patient Percentage: 60% Ambulation/Gait Ambulation/Gait Assistance: 1: +2 Total assist Ambulation/Gait: Patient Percentage: 70% Ambulation Distance (Feet): 100 Feet (x2) Assistive device: 2 person hand held assist Ambulation/Gait Assistance Details: pt with L sided neglect but no L knee buckling this date. Gait Pattern: Step-through pattern;Decreased stride length;Narrow base of support (occasional cross over) Gait velocity: slow General Gait Details: pushing to R Stairs: No    ADL: ADL Eating/Feeding: Other (comment) (modified diet) Grooming: Moderate assistance Where Assessed - Grooming: Supported sitting Upper Body Bathing: Minimal assistance Where Assessed - Upper Body Bathing: Supported sitting Lower Body Bathing: Moderate assistance Where Assessed - Lower Body Bathing: Supported sit to stand Upper Body Dressing: Moderate assistance Where Assessed - Upper Body Dressing: Supported sitting Lower Body Dressing: Moderate assistance Where Assessed - Lower Body Dressing: Supported sit to Pharmacist, hospital: Moderate assistance Toilet Transfer  Method: Stand pivot Toilet Transfer Equipment: Bedside commode Equipment Used: Gait belt Transfers/Ambulation Related to ADLs: Mod A with abulation. facilitation for LLE pattern and manual facilitation for weight  shift  ADL Comments: donned socks in bed  Cognition: Cognition Overall Cognitive Status: Impaired/Different from baseline Arousal/Alertness: Awake/alert Orientation Level: Oriented X4 Attention: Selective Selective Attention: Impaired Selective Attention Impairment: Verbal basic;Functional basic Memory: Impaired Memory Impairment: Storage deficit;Retrieval deficit;Decreased recall of new information;Prospective memory Awareness: Impaired Problem Solving: Impaired Problem Solving Impairment: Verbal basic;Functional basic Behaviors: Impulsive;Perseveration Safety/Judgment: Impaired Cognition Arousal/Alertness: Awake/alert Behavior During Therapy: WFL for tasks assessed/performed Overall Cognitive Status: Impaired/Different from baseline Area of Impairment: Attention;Memory;Safety/judgement;Awareness;Problem solving Current Attention Level: Selective Memory: Decreased recall of precautions Safety/Judgement: Decreased awareness of safety;Decreased awareness of deficits Awareness: Emergent Problem Solving: Slow processing;Decreased initiation;Difficulty sequencing;Requires tactile cues  Physical Exam: Blood pressure 161/82, pulse 76, temperature 99.1 F (37.3 C), temperature source Oral, resp. rate 18, height 5' (1.524 m), weight 53.7 kg (118 lb 6.2 oz), SpO2 97.00%. Physical Exam Constitutional: She is oriented to person, place, and time. She is frail appearing.  HENT:  Head: Normocephalic.  Poor dentition, mucosa moist Eyes: EOM are normal.  Neck: Normal range of motion. Neck supple. No thyromegaly present.  Cardiovascular: Normal rate and regular rhythm. No murmur Respiratory: Effort normal and breath sounds normal. No respiratory distress. No wheezes or rales GI:  Soft. Bowel sounds are normal. She exhibits no distension.  Neurological: She is alert and oriented to person, place, and time. HOH Follows commands. Fair insight to her deficits. Left facial weakness and tongue deviation. Speech slurred but intelligible. LUE is 2+/5 deltoid,   3- at bicep and tricep, 3/5 HI. LLE is 3 HF and 3+ KE, Ankle 3-4/5. No gross sensory deficits. Cognitively within normal limits.  Skin: Skin is warm and dry.  Psychiatric: She has a normal mood and affect. Her behavior is normal. Judgment and thought content normal.    Results for orders placed during the hospital encounter of 09/28/13 (from the past 48 hour(s))  COMPREHENSIVE METABOLIC PANEL     Status: Abnormal   Collection Time    09/29/13 12:50 PM      Result Value Range   Sodium 138  135 - 145 mEq/L   Potassium 4.2  3.5 - 5.1 mEq/L   Chloride 102  96 - 112 mEq/L   CO2 22  19 - 32 mEq/L   Glucose, Bld 133 (*) 70 - 99 mg/dL   BUN 11  6 - 23 mg/dL   Creatinine, Ser 1.09  0.50 - 1.10 mg/dL   Calcium 9.0  8.4 - 60.4 mg/dL   Total Protein 6.7  6.0 - 8.3 g/dL   Albumin 3.0 (*) 3.5 - 5.2 g/dL   AST 17  0 - 37 U/L   Comment: HEMOLYSIS AT THIS LEVEL MAY AFFECT RESULT   ALT 7  0 - 35 U/L   Alkaline Phosphatase 77  39 - 117 U/L   Total Bilirubin 0.3  0.3 - 1.2 mg/dL   GFR calc non Af Amer 89 (*) >90 mL/min   GFR calc Af Amer >90  >90 mL/min   Comment: (NOTE)     The eGFR has been calculated using the CKD EPI equation.     This calculation has not been validated in all clinical situations.     eGFR's persistently <90 mL/min signify possible Chronic Kidney     Disease.  CBC     Status: None   Collection Time    09/29/13 12:50 PM      Result Value Range   WBC 4.6  4.0 - 10.5 K/uL   RBC 4.38  3.87 - 5.11  MIL/uL   Hemoglobin 13.0  12.0 - 15.0 g/dL   HCT 16.1  09.6 - 04.5 %   MCV 89.5  78.0 - 100.0 fL   MCH 29.7  26.0 - 34.0 pg   MCHC 33.2  30.0 - 36.0 g/dL   RDW 40.9  81.1 - 91.4 %   Platelets 196  150 - 400  K/uL  CK TOTAL AND CKMB     Status: None   Collection Time    09/29/13 12:50 PM      Result Value Range   Total CK 134  7 - 177 U/L   CK, MB 2.7  0.3 - 4.0 ng/mL   Relative Index 2.0  0.0 - 2.5  URINALYSIS W MICROSCOPIC + REFLEX CULTURE     Status: Abnormal   Collection Time    10/01/13  2:27 AM      Result Value Range   Color, Urine YELLOW  YELLOW   APPearance CLOUDY (*) CLEAR   Specific Gravity, Urine 1.015  1.005 - 1.030   pH 6.5  5.0 - 8.0   Glucose, UA NEGATIVE  NEGATIVE mg/dL   Hgb urine dipstick TRACE (*) NEGATIVE   Bilirubin Urine NEGATIVE  NEGATIVE   Ketones, ur NEGATIVE  NEGATIVE mg/dL   Protein, ur NEGATIVE  NEGATIVE mg/dL   Urobilinogen, UA 1.0  0.0 - 1.0 mg/dL   Nitrite NEGATIVE  NEGATIVE   Leukocytes, UA SMALL (*) NEGATIVE   WBC, UA 3-6  <3 WBC/hpf   RBC / HPF 0-2  <3 RBC/hpf   Bacteria, UA FEW (*) RARE   Squamous Epithelial / LPF FEW (*) RARE   Ct Head Wo Contrast  09/29/2013   CLINICAL DATA:  Frontal headache with nausea and vomiting.  EXAM: CT HEAD WITHOUT CONTRAST  TECHNIQUE: Contiguous axial images were obtained from the base of the skull through the vertex without intravenous contrast.  COMPARISON:  None.  FINDINGS: Acute right lenticular nucleus/caudate/right perioperculum infarct. Progressive mild mass effect upon the right lateral ventricle without midline shift. No associated hemorrhage.  No intracranial mass lesion noted on this unenhanced exam.  IMPRESSION: Acute right lenticular nucleus/caudate/right perioperculum infarct. Progressive mild mass effect upon the right lateral ventricle without midline shift. No associated hemorrhage.   Electronically Signed   By: Bridgett Larsson M.D.   On: 09/29/2013 11:34    Post Admission Physician Evaluation: 1. Functional deficits secondary  to thrombotic right basal ganglia infarct. 2. Patient is admitted to receive collaborative, interdisciplinary care between the physiatrist, rehab nursing staff, and therapy  team. 3. Patient's level of medical complexity and substantial therapy needs in context of that medical necessity cannot be provided at a lesser intensity of care such as a SNF. 4. Patient has experienced substantial functional loss from his/her baseline which was documented above under the "Functional History" and "Functional Status" headings.  Judging by the patient's diagnosis, physical exam, and functional history, the patient has potential for functional progress which will result in measurable gains while on inpatient rehab.  These gains will be of substantial and practical use upon discharge  in facilitating mobility and self-care at the household level. 5. Physiatrist will provide 24 hour management of medical needs as well as oversight of the therapy plan/treatment and provide guidance as appropriate regarding the interaction of the two. 6. 24 hour rehab nursing will assist with bladder management, bowel management, safety, skin/wound care, disease management, medication administration, pain management and patient education  and help integrate therapy concepts, techniques,education, etc.  7. PT will assess and treat for/with: Lower extremity strength, range of motion, stamina, balance, functional mobility, safety, adaptive techniques and equipment, NMR, education.   Goals are: supervision to mod I. 8. OT will assess and treat for/with: ADL's, functional mobility, safety, upper extremity strength, adaptive techniques and equipment, NMR, education.   Goals are: supervision to mod I. 9. SLP will assess and treat for/with: speech intelligibility, swallowing.  Goals are: mod I. 10. Case Management and Social Worker will assess and treat for psychological issues and discharge planning. 11. Team conference will be held weekly to assess progress toward goals and to determine barriers to discharge. 12. Patient will receive at least 3 hours of therapy per day at least 5 days per week. 13. ELOS: 13-17 days        14. Prognosis:  excellent   Medical Problem List and Plan: 1. Thrombotic right basal ganglia infarction 2. DVT Prophylaxis/Anticoagulation: SCDs. Monitor for any signs of DVT. Moving the leg fairly well. 3. Pain Management: Tylenol as needed 4. Neuropsych: This patient is capable of making decisions on her own behalf. 5. Hypertension. Lisinopril 10 mg daily. Monitor with increased mobility 6. Tobacco abuse. NicoDerm patch. Provide counseling for this as well as secondary stroke prevention 7. Gram-negative rod UTI. Bactrim 09/28/2013--7 day course.  Denies any dysuria or hematuria 8. Hyperlipidemia. Lipitor   Ranelle Oyster, MD, [facility] Physical Medicine & Rehabilitation   10/01/2013

## 2013-10-04 ENCOUNTER — Inpatient Hospital Stay (HOSPITAL_COMMUNITY): Payer: BC Managed Care – PPO | Admitting: Physical Therapy

## 2013-10-04 ENCOUNTER — Inpatient Hospital Stay (HOSPITAL_COMMUNITY): Payer: BC Managed Care – PPO

## 2013-10-04 NOTE — Progress Notes (Signed)
Subjective/Complaints: No major issues. Was able to sleep fairly well. Still doesn't have much of an appetite. A 12 point review of systems has been performed and if not noted above is otherwise negative.   Objective: Vital Signs: Blood pressure 121/77, pulse 64, temperature 98 F (36.7 C), temperature source Oral, resp. rate 18, height 5' (1.524 m), weight 50.4 kg (111 lb 1.8 oz), SpO2 98.00%. No results found. No results found for this basename: WBC, HGB, HCT, PLT,  in the last 72 hours No results found for this basename: NA, K, CL, CO, GLUCOSE, BUN, CREATININE, CALCIUM,  in the last 72 hours CBG (last 3)  No results found for this basename: GLUCAP,  in the last 72 hours  Wt Readings from Last 3 Encounters:  10/03/13 50.4 kg (111 lb 1.8 oz)  10/01/13 54.885 kg (121 lb)    Physical Exam:  Constitutional: She is oriented to person, place, and time. She is frail, side lying in bed HENT:  Head: Normocephalic.  Poor dentition, mucosa moist  Eyes: EOM are normal.  Neck: Normal range of motion. Neck supple. No thyromegaly present.  Cardiovascular: Normal rate and regular rhythm. No murmur  Respiratory: Effort normal and breath sounds normal. No respiratory distress. No wheezes or rales  GI: Soft. Bowel sounds are normal. She exhibits no distension.  Neurological: She is alert and oriented to person, place, and time. HOH Follows commands. Fair insight to her deficits. Left facial weakness and tongue deviation. Speech still slurred but intelligible. LUE is 2+/5 deltoid, 3- at bicep and tricep, 3/5 HI. LLE is 3 HF and 3+ KE, Ankle 3-4/5. No gross sensory deficits. Cognitively within normal limits.  Skin: Skin is warm and dry.  Psychiatric: She has a normal mood and affect. Her behavior is normal. Judgment and thought content normal   Assessment/Plan: 1. Functional deficits secondary to thrombotic right basal ganglia infarct which require 3+ hours per day of interdisciplinary therapy  in a comprehensive inpatient rehab setting. Physiatrist is providing close team supervision and 24 hour management of active medical problems listed below. Physiatrist and rehab team continue to assess barriers to discharge/monitor patient progress toward functional and medical goals. FIM: FIM - Bathing Bathing: 0: Activity did not occur  FIM - Upper Body Dressing/Undressing Upper body dressing/undressing: 0: Wears gown/pajamas-no public clothing FIM - Lower Body Dressing/Undressing Lower body dressing/undressing: 0: Wears gown/pajamas-no public clothing  FIM - Toileting Toileting steps completed by patient: Adjust clothing prior to toileting;Performs perineal hygiene;Adjust clothing after toileting Toileting Assistive Devices: Grab bar or rail for support Toileting: 4: Steadying assist  FIM - Diplomatic Services operational officer Devices: Bedside commode Toilet Transfers: 4-To toilet/BSC: Min A (steadying Pt. > 75%);4-From toilet/BSC: Min A (steadying Pt. > 75%)  FIM - Bed/Chair Transfer Bed/Chair Transfer Assistive Devices: Bed rails;Arm rests Bed/Chair Transfer: 4: Bed > Chair or W/C: Min A (steadying Pt. > 75%);4: Chair or W/C > Bed: Min A (steadying Pt. > 75%)  FIM - Locomotion: Wheelchair Locomotion: Wheelchair: 0: Activity did not occur  Comprehension Comprehension Mode: Auditory Comprehension: 5-Follows basic conversation/direction: With no assist  Expression Expression Mode: Verbal Expression: 5-Expresses basic needs/ideas: With no assist  Social Interaction Social Interaction: 5-Interacts appropriately 90% of the time - Needs monitoring or encouragement for participation or interaction.  Problem Solving Problem Solving: 5-Solves basic 90% of the time/requires cueing < 10% of the time  Memory Memory: 5-Recognizes or recalls 90% of the time/requires cueing < 10% of the time   Medical Problem  List and Plan:  1. Thrombotic right basal ganglia infarction  2.  DVT Prophylaxis/Anticoagulation: SCDs. Follow clinically for now. 3. Pain Management: Tylenol as needed  4. Neuropsych: This patient is capable of making decisions on her own behalf.  5. Hypertension. Lisinopril 10 mg daily. Normotensive at present 6. Tobacco abuse. NicoDerm patch. Provide counseling for this as well as secondary stroke prevention  7. Gram-negative rod UTI. Bactrim 09/28/2013--7 day course. Denies any dysuria or hematuria  8. Hyperlipidemia. Lipitor  9. FEN: encourage PO intake. Daughter helps with this. ?RD consult     LOS (Days) 1 A FACE TO FACE EVALUATION WAS PERFORMED  Lana Flaim T 10/04/2013 7:52 AM

## 2013-10-04 NOTE — Progress Notes (Signed)
Physical Therapy Session Note  Patient Details  Name: Erika Wang MRN: [id number] Date of Birth: 1950-12-22  Today's Date: 10/04/2013 Time: 1300-1330 Time Calculation (min): 30 min  Short Term Goals: Week 1:  PT Short Term Goal 1 (Week 1): Patient will be able to perform bed mobility independently. PT Short Term Goal 2 (Week 1): Patient will be able to perform transfers with S/Mod-Independent. PT Short Term Goal 3 (Week 1): Patient will be able to perform up/down 2 steps without handrail with S/Mod-Independent assist with LRAD. PT Short Term Goal 4 (Week 1): Patient will be able to perform gait using LRAD x 200' with S/Mod-I.  Precautions:  Precautions Precautions: Fall Precaution Comments: L hemiparesis Restrictions Weight Bearing Restrictions: No Pain: denies pain  Gait Training:(15') using RW 3 x 150' min-assist with verbal cues and tactile cues to stay within RW (tends to position and drift toward left side) and verbal cues to look ahead as patient frequent focuses on framed articles on the wall or looking into open doors of rooms resulting in decreased                               stability in gait.  Stride length is asymmetric but will alternate step-to gait. Therapeutic Exercise:(15') Nu-Step, Level 4 x 10 minutes with frequent rest breaks and verbal cues to re-grip Left hand that tends to get loose.    Therapy/Group: Individual Therapy  Rex Kras 10/04/2013, 4:45 PM

## 2013-10-04 NOTE — Evaluation (Signed)
Occupational Therapy Assessment and Plan  Patient Details  Name: Erika Wang MRN: [id number] Date of Birth: 06/28/1951  OT Diagnosis: cognitive deficits, hemiplegia affecting non-dominant side and muscle weakness (generalized) Rehab Potential: Rehab Potential: Good ELOS: 13-17 days   Today's Date: 10/04/2013 Time: 0900-1000 Time Calculation (min): 60 min  Problem List:  Patient Active Problem List   Diagnosis Date Noted  . CVA (cerebral infarction) 10/03/2013  . Infection of urinary tract 10/02/2013  . Cigar smoker motivated to quit 10/02/2013  . 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 Left hemiparesis 10/02/2013  . Encounter for long-term (current) use of medications 10/02/2013  . Stroke 09/28/2013    Past Medical History: No past medical history on file. Past Surgical History:  Past Surgical History  Procedure Laterality Date  . Cesarean section      Assessment & Plan Clinical Impression: Patient is a 62 y.o. right-handed female with history of tobacco abuse and no scheduled medications at time of admission. Admitted 09/28/2013 with left-sided weakness. Patient independent prior to admission living alone in [facility] [place] and was visiting her daughter in [place]. MRI showed right basal ganglia infarct. MRA of the head with anterior circulation negative except for some mild irregularity compatible with atherosclerosis. Echocardiogram with ejection fraction of 70% no wall motion abnormalities. Carotid Dopplers with no ICA stenosis. Neurology services consulted patient did receive TPA. Placed on aspirin for CVA prophylaxis. Patient is maintained on a regular diet. Blood pressure monitored with the addition of lisinopril. NicoDerm patch added for history of tobacco abuse. Urine study 09/28/2013 greater than 100,000 gram-negative rods and placed on Bactrim. Physical and occupational therapy evaluations completed 09/29/2013 with recommendations for physical medicine  rehabilitation consult to consider inpatient rehabilitation services. Patient was felt to be a good candidate for inpatient rehabilitation services and was admitted for comprehensive rehabilitation program.  Patient transferred to CIR on 10/03/2013 .    Patient currently requires min with basic self-care skills secondary to muscle weakness, impaired timing and sequencing, unbalanced muscle activation, decreased coordination and decreased motor planning, decreased attention to left and decreased awareness, decreased problem solving and decreased safety awareness.  Prior to hospitalization, patient could complete BADLs and IADLs with independent .  Patient will benefit from skilled intervention to increase independence with basic self-care skills and increase level of independence with iADL prior to discharge home independently.  Anticipate patient will require no supervision secondary to modified independent goals and follow up home health.  OT - End of Session Endurance Deficit: No OT Assessment Rehab Potential: Good OT Patient demonstrates impairments in the following area(s): Balance;Cognition;Motor;Safety;Behavior;Perception OT Basic ADL's Functional Problem(s): Grooming;Bathing;Dressing;Toileting;Eating OT Advanced ADL's Functional Problem(s): Simple Meal Preparation;Laundry;Light Housekeeping OT Transfers Functional Problem(s): Toilet;Tub/Shower OT Additional Impairment(s): Fuctional Use of Upper Extremity OT Plan OT Intensity: Minimum of 1-2 x/day, 45 to 90 minutes OT Frequency: 5 out of 7 days OT Duration/Estimated Length of Stay: 13-17 days OT Treatment/Interventions: Balance/vestibular training;Cognitive remediation/compensation;Community reintegration;Discharge planning;DME/adaptive equipment instruction;Functional mobility training;Neuromuscular re-education;Patient/family education;Self Care/advanced ADL retraining;Therapeutic Activities;Therapeutic Exercise;UE/LE Strength  taining/ROM;UE/LE Coordination activities OT Self Feeding Anticipated Outcome(s): Mod I OT Basic Self-Care Anticipated Outcome(s): Mod I OT Toileting Anticipated Outcome(s): Mod I  OT Bathroom Transfers Anticipated Outcome(s): Mod I  OT Recommendation Patient destination: Home Follow Up Recommendations: Home health OT Equipment Recommended: Tub/shower bench   Skilled Therapeutic Intervention Pt seen for ADL retraining with focus on functional use of LUE, attention to L, functional transfers, and dynamic standing balance. Ambulated with HHA min assist bed>bathroom with slight  lean to L and decreased awareness of door frame on left. Completed bathing with steadying assist when standing for peri hygiene. Pt used LUE at diminished level throughout session. Completed dressing from w/c and decreased attention to LUE as she was unaware LUE was not donned fully on bra strap prior to pulling overhead. Min assist for standing balance during LB dressing and cues for hemi dressing technique. At end of session pt left sitting in w/c with QRB donned.  OT Evaluation Precautions/Restrictions  Precautions Precautions: Fall Precaution Comments: L hemiparesis Restrictions Weight Bearing Restrictions: No General   Vital Signs   Pain Pain Assessment Pain Assessment: No/denies pain Pain Score: 0-No pain Multiple Pain Sites: No Home Living/Prior Functioning Home Living Available Help at Discharge: Family Type of Home: House (lives in [place], [place]) Home Access: Stairs to enter Secretary/administrator of Steps: 2 Entrance Stairs-Rails: None Home Layout: One level Additional Comments: Lives in [place] and works for [employer]. Was visiting daughter after giving birth to son  and also has 14yo son  Lives With: Alone IADL History Education: 12th grade education per pt. Prior Function Level of Independence: Independent with basic ADLs;Independent with gait;Independent with transfers  Able to  Take Stairs?: Yes Driving: No Vocation: Full time employment ADL   Vision/Perception  Vision - History Baseline Vision: Wears glasses only for reading Patient Visual Report: No change from baseline Vision - Assessment Eye Alignment: Within Functional Limits Perception Perception: Impaired Inattention/Neglect: Impaired-to be further tested in functional context (L inattention) Praxis Praxis: Intact  Cognition Arousal/Alertness: Awake/alert Orientation Level: Oriented X4 Awareness: Impaired Problem Solving: Impaired Behaviors: Impulsive Safety/Judgment: Impaired Sensation Sensation Light Touch: Appears Intact Coordination Gross Motor Movements are Fluid and Coordinated: No Fine Motor Movements are Fluid and Coordinated: No Finger Nose Finger Test: impaired on L; decreased speed Heel Shin Test: imaired on L Motor    Mobility  Bed Mobility Bed Mobility: Rolling Right;Rolling Left;Right Sidelying to Sit;Sitting - Scoot to [place] of Bed;Sit to Supine;Scooting to [facility] Rolling Right: With rail;5: Supervision;Not tested (comment) Rolling Left: 5: Supervision;With rail Right Sidelying to Sit: 4: Min guard;HOB flat;With rails Supine to Sit: 4: Min guard;HOB flat;With rails Sitting - Scoot to Edge of Bed: 5: Supervision;With rail Sit to Supine: 4: Min guard;With rail;HOB flat Scooting to HOB: 4: Min assist;With rail Transfers Sit to Stand: 4: Min assist;With upper extremity assist;From bed;From toilet Stand to Sit: 4: Min assist;To chair/3-in-1;To toilet  Trunk/Postural Assessment  Postural Control Postural Control: Deficits on evaluation (mild left trunk lean)  Balance Balance Balance Assessed: Yes Static Sitting Balance Static Sitting - Balance Support: Right upper extremity supported;Feet supported Static Sitting - Level of Assistance: 5: Stand by assistance Dynamic Sitting Balance Dynamic Sitting - Balance Support: Feet supported Dynamic Sitting - Level of Assistance: 5:  Stand by assistance Dynamic Sitting - Balance Activities: Lateral lean/weight shifting;Forward lean/weight shifting Static Standing Balance Static Standing - Balance Support: Bilateral upper extremity supported Static Standing - Level of Assistance: 4: Min assist Dynamic Standing Balance Dynamic Standing - Balance Support: Bilateral upper extremity supported Dynamic Standing - Level of Assistance: 3: Mod assist Dynamic Standing - Balance Activities: Lateral lean/weight shifting;Forward lean/weight shifting Extremity/Trunk Assessment RUE Assessment RUE Assessment: Within Functional Limits LUE Assessment LUE Assessment: Exceptions to [facility] (impaired coordination)  FIM:  FIM - Eating Eating Activity: 5: Needs verbal cues/supervision FIM - Grooming Grooming Steps: Wash, rinse, dry face;Wash, rinse, dry hands Grooming: 3: Patient completes 2 of 4 or 3 of 5 steps FIM -  Bathing Bathing Steps Patient Completed: Chest;Right Arm;Left Arm;Abdomen;Left upper leg;Right upper leg;Buttocks;Right lower leg (including foot);Left lower leg (including foot);Front perineal area Bathing: 4: Steadying assist FIM - Upper Body Dressing/Undressing Upper body dressing/undressing steps patient completed: Thread/unthread right bra strap;Hook/unhook bra;Thread/unthread right sleeve of pullover shirt/dresss;Thread/unthread left sleeve of pullover shirt/dress;Put head through opening of pull over shirt/dress;Pull shirt over trunk Upper body dressing/undressing: 4: Min-Patient completed 75 plus % of tasks FIM - Lower Body Dressing/Undressing Lower body dressing/undressing steps patient completed: Thread/unthread right pants leg;Pull pants up/down;Don/Doff right sock Lower body dressing/undressing: 3: Mod-Patient completed 50-74% of tasks FIM - Toileting Toileting steps completed by patient: Adjust clothing prior to toileting;Performs perineal hygiene;Adjust clothing after toileting Toileting Assistive Devices: Grab  bar or rail for support Toileting: 4: Steadying assist FIM - Games developer Transfer: 4: Bed > Chair or W/C: Min A (steadying Pt. > 75%);4: Supine > Sit: Min A (steadying Pt. > 75%/lift 1 leg) FIM - Diplomatic Services operational officer Devices: Grab bars Toilet Transfers: 4-To toilet/BSC: Min A (steadying Pt. > 75%);4-From toilet/BSC: Min A (steadying Pt. > 75%) FIM - Tub/Shower Transfers Tub/Shower Assistive Devices: Walk in shower;Tub transfer bench;Grab bars Tub/shower Transfers: 4-Into Tub/Shower: Min A (steadying Pt. > 75%/lift 1 leg);4-Out of Tub/Shower: Min A (steadying Pt. > 75%/lift 1 leg)   Refer to Care Plan for Long Term Goals  Recommendations for other services: None  Discharge Criteria: Patient will be discharged from OT if patient refuses treatment 3 consecutive times without medical reason, if treatment goals not met, if there is a change in medical status, if patient makes no progress towards goals or if patient is discharged from hospital.  The above assessment, treatment plan, treatment alternatives and goals were discussed and mutually agreed upon: by patient  Daneil Dan 10/04/2013, 12:31 PM

## 2013-10-04 NOTE — Evaluation (Signed)
Physical Therapy Assessment and Plan  Patient Details  Name: Erika Wang MRN: [id number] Date of Birth: 1951-03-25  PT Diagnosis: Abnormality of gait, Coordination disorder, Hemiparesis non-dominant and Impaired cognition Rehab Potential: Good ELOS: 13-17days  Today's Date: 10/04/2013 Time: 1100-1200 Time Calculation (min): 60 min  Problem List:  Patient Active Problem List   Diagnosis Date Noted  . CVA (cerebral infarction) 10/03/2013  . Infection of urinary tract 10/02/2013  . Cigar smoker motivated to quit 10/02/2013  . 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 Left hemiparesis 10/02/2013  . Encounter for long-term (current) use of medications 10/02/2013  . Stroke 09/28/2013    Past Medical History: No past medical history on file. Past Surgical History:  Past Surgical History  Procedure Laterality Date  . Cesarean section      Assessment & Plan Clinical Impression:Erika Wang is a 62 y.o. right-handed female with history of tobacco abuse and no scheduled medications at time of admission. Admitted 09/28/2013 with left-sided weakness. Patient independent prior to admission living alone in [facility] [place] and was visiting her daughter in [place]. MRI showed right basal ganglia infarct. MRA of the head with anterior circulation negative except for some mild irregularity compatible with atherosclerosis. Echocardiogram with ejection fraction of 70% no wall motion abnormalities. Carotid Dopplers with no ICA stenosis. Neurology services consulted patient did receive TPA. Placed on aspirin for CVA prophylaxis. Patient is maintained on a regular diet. Blood pressure monitored with the addition of lisinopril. NicoDerm patch added for history of tobacco abuse. Urine study 09/28/2013 greater than 100,000 gram-negative rods and placed on Bactrim. Physical and occupational therapy evaluations completed 09/29/2013 with recommendations for physical medicine rehabilitation consult to  consider inpatient rehabilitation services.   Patient transferred to CIR on 10/03/2013 .   Patient currently requires min with mobility secondary to abnormal tone, motor apraxia and decreased coordination.  Prior to hospitalization, patient was independent  with mobility and lived with Alone in a House (lives in [place], [place]) home.  Home access is 2Stairs to enter.  Patient will benefit from skilled PT intervention to maximize safe functional mobility, minimize fall risk and decrease caregiver burden for planned discharge home alone.  Anticipate patient will benefit from follow up HH at discharge.  PT - End of Session Activity Tolerance: Tolerates 30+ min activity without fatigue Endurance Deficit: No PT Assessment Rehab Potential: Good Barriers to Discharge: Decreased caregiver support Barriers to Discharge Comments: lives alone in [place] with daughter in [place] who has 14yo and 2 wk old sons PT Patient demonstrates impairments in the following area(s): Balance;Behavior;Motor;Safety PT Transfers Functional Problem(s): Bed Mobility;Bed to Chair;Car;Furniture;Floor PT Locomotion Functional Problem(s): Ambulation;Stairs PT Plan PT Intensity: Minimum of 1-2 x/day ,45 to 90 minutes PT Frequency: 5 out of 7 days PT Duration Estimated Length of Stay: 13 to 17 days PT Treatment/Interventions: Ambulation/gait training;Balance/vestibular training;Cognitive remediation/compensation;DME/adaptive equipment instruction;Functional mobility training;Neuromuscular re-education;Patient/family education;Stair training;Therapeutic Activities;Therapeutic Exercise;UE/LE Strength taining/ROM;UE/LE Coordination activities PT Transfers Anticipated Outcome(s): Independent/Mod-independent PT Locomotion Anticipated Outcome(s): Mod-Independent with LRAD x 300'+ PT Recommendation Follow Up Recommendations: Home health PT Patient destination: Home Equipment Recommended: Cane Equipment Details: LRAD TBD closer to  discharge  Skilled Therapeutic Intervention   PT Evaluation Precautions/Restrictions Precautions Precautions: Fall Precaution Comments: L hemiparesis, Left shoulder pain resolved from yesterday Restrictions Weight Bearing Restrictions: No General Chart Reviewed: Yes Family/Caregiver Present: No  Pain Pain Assessment Pain Assessment: No/denies pain Pain Score: 0-No pain Multiple Pain Sites: No Home Living/Prior Functioning Home Living Available Help at Discharge: Family Type  of Home: House (lives in [place], [place]) Home Access: Stairs to enter [employer] of Steps: 2 Home Layout: One level Additional Comments: Lives in [place] and works for [employer]. 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[employer]) Vision/Perception  Vision - History Baseline Vision: No visual deficits Patient Visual Report: No change from baseline  Cognition Overall Cognitive Status: Impaired/Different from baseline Arousal/Alertness: Awake/alert Orientation Level: Oriented X4 Attention: Selective Selective Attention: Appears intact Memory: Impaired Awareness: Impaired Awareness Impairment: Emergent impairment Problem Solving: Impaired Problem Solving Impairment: Verbal complex;Functional complex Executive Function: Self Monitoring Self Monitoring: Impaired Self Monitoring Impairment: Functional complex Behaviors: Impulsive Safety/Judgment: Impaired Sensation Sensation Light Touch: Appears Intact Coordination Gross Motor Movements are Fluid and Coordinated: No Fine Motor Movements are Fluid and Coordinated: No Finger Nose Finger Test: impaired on L Heel Shin Test: imaired on L  Mobility Bed  Mobility Bed Mobility: Rolling Right;Rolling Left;Right Sidelying to Sit;Sitting - Scoot to [place] of Bed;Sit to Supine;Scooting to [facility] Rolling Right: With rail;5: Supervision;Not tested (comment) Rolling Left: 5: Supervision;With rail Right Sidelying to Sit: 4: Min guard;HOB flat;With rails Supine to Sit: 4: Min guard;HOB flat;With rails Sitting - Scoot to Edge of Bed: 5: Supervision;With rail Sit to Supine: 4: Min guard;With rail;HOB flat Scooting to HOB: 4: Min assist;With rail Transfers Sit to Stand: 4: Min assist;With upper extremity assist;From bed;From toilet Stand to Sit: 4: Min assist;To chair/3-in-1;To toilet Stand Pivot Transfers: 4: Min assist Locomotion  Ambulation Ambulation: Yes Ambulation/Gait Assistance: 4: Min assist Ambulation Distance (Feet): 100 Feet Assistive device: Rolling walker Gait Gait: Yes Gait Pattern: Impaired Gait Pattern: Step-to pattern;Decreased step length - left;Shuffle;Lateral trunk lean to left Gait velocity: decreased Stairs / Additional Locomotion Stairs: Yes Stairs Assistance: 4: Min assist Stair Management Technique: One rail Right Number of Stairs: 3 Wheelchair Mobility Wheelchair Mobility: No  Trunk/Postural Assessment  Postural Control Postural Control: Deficits on evaluation (mild left trunk lean)  Balance Balance Balance Assessed: Yes Static Sitting Balance Static Sitting - Balance Support: Right upper extremity supported;Feet supported Static Sitting - Level of Assistance: 5: Stand by assistance Dynamic Sitting Balance Dynamic Sitting - Balance Support: Feet supported Dynamic Sitting - Level of Assistance: 5: Stand by assistance Dynamic Sitting - Balance Activities: Lateral lean/weight shifting;Forward lean/weight shifting Static Standing Balance Static Standing - Balance Support: Bilateral upper extremity supported Static Standing - Level of Assistance: 4: Min assist Dynamic Standing Balance Dynamic Standing - Balance  Support: Bilateral upper extremity supported Dynamic Standing - Level of Assistance: 3: Mod assist Dynamic Standing - Balance Activities: Lateral lean/weight shifting;Forward lean/weight shifting Extremity Assessment  RUE Assessment RUE Assessment: Within Functional Limits LUE Assessment LUE Assessment: Exceptions to [facility] (impaired coordination) RLE Assessment RLE Assessment: Within Functional Limits LLE Assessment LLE Assessment: Exceptions to WFL (L LE MMT: 4/5, coordination impaired)  FIM:  FIM - Banker Devices: Therapist, occupational: 4: Supine > Sit: Min A (steadying Pt. > 75%/lift 1 leg);4: Sit > Supine: Min A (steadying pt. > 75%/lift 1 leg);4: Bed > Chair or W/C: Min A (steadying Pt. > 75%);4: Chair or W/C > Bed: Min A (steadying Pt. > 75%) FIM - Locomotion: Wheelchair Locomotion: Wheelchair: 0: Activity did not occur FIM - Locomotion: Ambulation Locomotion:  Ambulation Assistive Devices: Designer, industrial/product Ambulation/Gait Assistance: 4: Min assist Locomotion: Ambulation: 2: Travels 50 - 149 ft with minimal assistance (Pt.>75%) FIM - Locomotion: Stairs Locomotion: Building control surveyor: Hand rail - 1 Locomotion: Stairs: 1: Up and Down < 4 stairs with minimal assistance (Pt.>75%)   Refer to Care Plan for Long Term Goals  Recommendations for other services: None  Discharge Criteria: Patient will be discharged from PT if patient refuses treatment 3 consecutive times without medical reason, if treatment goals not met, if there is a change in medical status, if patient makes no progress towards goals or if patient is discharged from hospital.  The above assessment, treatment plan, treatment alternatives and goals were discussed and mutually agreed upon: by patient  Rex Kras 10/04/2013, 4:25 PM

## 2013-10-04 NOTE — Evaluation (Signed)
Speech Language Pathology Assessment and Plan  Patient Details  Name: Erika Wang MRN: [id number] Date of Birth: 1951-07-06  SLP Diagnosis: Cognitive Impairments;Dysphagia  Rehab Potential: Good ELOS: 13-17 days   Today's Date: 10/04/2013 Time: 0800-0900 Time Calculation (min): 60 min  Problem List:  Patient Active Problem List   Diagnosis Date Noted  . CVA (cerebral infarction) 10/03/2013  . Infection of urinary tract 10/02/2013  . Cigar smoker motivated to quit 10/02/2013  . 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 Left hemiparesis 10/02/2013  . Encounter for long-term (current) use of medications 10/02/2013  . Stroke 09/28/2013   Past Medical History: No past medical history on file. Past Surgical History:  Past Surgical History  Procedure Laterality Date  . Cesarean section      Assessment / Plan / Recommendation Clinical Impression  Pt is a 62 y.o. right-handed female with history of tobacco abuse and no scheduled medications at time of admission. Admitted 09/28/13 with left-sided weakness. Patient independent PTA living alone in [place], [place] and was visiting her daughter in [place]. MRI showed right basal ganglia infarct. MRA of the head with anterior circulation negative except for some mild irregularity compatible with atherosclerosis. Echocardiogram with ejection fraction of 70% no wall motion abnormalities. Carotid Dopplers with no ICA stenosis. Neurology services consulted patient did receive TPA. Placed on aspirin for CVA prophylaxis. Patient is maintained on a regular diet. Blood pressure monitored with the addition of lisinopril. NicoDerm patch added for history of tobacco abuse. Urine study 09/28/2013 greater than 100,000 gram-negative rods and placed on Bactrim. PT/OT evaluations completed 09/29/13 with recommendations for physical medicine rehabilitation consult to consider inpatient rehabilitation services. Patient was felt to be a good candidate for inpatient  rehabilitation services and was admitted for comprehensive rehabilitation program 12/5; SLP bedside swallow and cognitive-linguistic evaluations completed 12/6. Pt is consuming Dys 3 textures with chopped meats and thin liquids with no overt s/s of aspiration, however was consuming regular textures at home prior to admission. SLP questions a mild dysarthria with imprecise articulation, however pt reports that she has returned to her baseline. Speech may therefore be impacted by missing dentition, however remains intelligible at the conversational level. Pt presents with cognitive impairments in emergent awareness, recall of information, mildly complex problem solving, and self-monitoring. Pt will benefit from SLP services to maximize safe swallowing and functional independence prior to discharge home.    SLP Assessment  Patient will need skilled Speech Lanaguage Pathology Services during CIR admission    Recommendations  Diet Recommendations: Dysphagia 3 (Mechanical Soft);Thin liquid (chopped meats) Liquid Administration via: Cup;Straw;No straw Medication Administration: Whole meds with puree Supervision: Patient able to self feed Compensations: Slow rate;Small sips/bites;Follow solids with liquid Postural Changes and/or Swallow Maneuvers: Seated upright 90 degrees;Upright 30-60 min after meal Oral Care Recommendations: Oral care BID Patient destination: Home Follow up Recommendations: Other (comment) (TBD) Equipment Recommended: None recommended by SLP    SLP Frequency 5 out of 7 days   SLP Treatment/Interventions Cognitive remediation/compensation;Cueing hierarchy;Dysphagia/aspiration precaution training;Environmental controls;Functional tasks;Internal/external aids;Medication managment;Patient/family education    Pain   Prior Functioning Cognitive/Linguistic Baseline: Within functional limits Type of Home: House (lives in [place], [place])  Lives With: Alone Available Help at Discharge:  Family Education: 12th grade education per pt. Vocation: Full time employment (works for [employer])  Short Term Goals: Week 1: SLP Short Term Goal 1 (Week 1): Pt will demonstrate adequate mastication and oral clearance with regular textures with Min cues to assess readiness for diet advancement, SLP  Short Term Goal 2 (Week 1): Pt will utilize external memory aids to recall new information with Supervision SLP Short Term Goal 3 (Week 1): Pt will demonstrate adequate medication management with use of external aids with Supervision SLP Short Term Goal 4 (Week 1): Pt will demonstrate emergent awareness of physical and cognitive impairments with Supervision SLP Short Term Goal 5 (Week 1): Pt will demonstrate adequate problem solving with mildly complex tasks with Supervision  See FIM for current functional status Refer to Care Plan for Long Term Goals  Recommendations for other services: None  Discharge Criteria: Patient will be discharged from SLP if patient refuses treatment 3 consecutive times without medical reason, if treatment goals not met, if there is a change in medical status, if patient makes no progress towards goals or if patient is discharged from hospital.  The above assessment, treatment plan, treatment alternatives and goals were discussed and mutually agreed upon: by patient   Maxcine Ham, M.A. CCC-SLP [phone]   Maxcine Ham 10/04/2013, 4:11 PM

## 2013-10-05 ENCOUNTER — Inpatient Hospital Stay (HOSPITAL_COMMUNITY): Payer: BC Managed Care – PPO | Admitting: *Deleted

## 2013-10-05 NOTE — Progress Notes (Signed)
Subjective/Complaints: Slept well. No new issues. Ate little yesterday A 12 point review of systems has been performed and if not noted above is otherwise negative.   Objective: Vital Signs: Blood pressure 103/69, pulse 80, temperature 98.7 F (37.1 C), temperature source Oral, resp. rate 18, height 5' (1.524 m), weight 50.4 kg (111 lb 1.8 oz), SpO2 98.00%. No results found. No results found for this basename: WBC, HGB, HCT, PLT,  in the last 72 hours No results found for this basename: NA, K, CL, CO, GLUCOSE, BUN, CREATININE, CALCIUM,  in the last 72 hours CBG (last 3)  No results found for this basename: GLUCAP,  in the last 72 hours  Wt Readings from Last 3 Encounters:  10/03/13 50.4 kg (111 lb 1.8 oz)  10/01/13 54.885 kg (121 lb)    Physical Exam:  Constitutional: She is oriented to person, place, and time. She is frail, side lying in bed HENT:  Head: Normocephalic.  Poor dentition, mucosa moist  Eyes: EOM are normal.  Neck: Normal range of motion. Neck supple. No thyromegaly present.  Cardiovascular: Normal rate and regular rhythm. No murmur  Respiratory: Effort normal and breath sounds normal. No respiratory distress. No wheezes or rales  GI: Soft. Bowel sounds are normal. She exhibits no distension. No pain Neurological: She is alert and oriented to person, place, and time. HOH Follows commands. Fair insight to her deficits. Left facial weakness and tongue deviation. Speech still slurred but intelligible. LUE is 2+/5 deltoid, 3- to 3 at bicep and tricep, 3/5 HI. LLE is 3 HF and 3+ KE, Ankle 3-4/5. No gross sensory deficits. Cognitively within normal limits.  Skin: Skin is warm and dry.  Psychiatric: She has a normal mood and affect. Her behavior is normal. Judgment and thought content normal   Assessment/Plan: 1. Functional deficits secondary to thrombotic right basal ganglia infarct which require 3+ hours per day of interdisciplinary therapy in a comprehensive  inpatient rehab setting. Physiatrist is providing close team supervision and 24 hour management of active medical problems listed below. Physiatrist and rehab team continue to assess barriers to discharge/monitor patient progress toward functional and medical goals. FIM: FIM - Bathing Bathing Steps Patient Completed: Chest;Right Arm;Left Arm;Abdomen;Left upper leg;Right upper leg;Buttocks;Right lower leg (including foot);Left lower leg (including foot);Front perineal area Bathing: 0: Activity did not occur  FIM - Upper Body Dressing/Undressing Upper body dressing/undressing steps patient completed: Thread/unthread right bra strap;Hook/unhook bra;Thread/unthread right sleeve of pullover shirt/dresss;Thread/unthread left sleeve of pullover shirt/dress;Put head through opening of pull over shirt/dress;Pull shirt over trunk Upper body dressing/undressing: 0: Activity did not occur FIM - Lower Body Dressing/Undressing Lower body dressing/undressing steps patient completed: Thread/unthread right pants leg;Pull pants up/down;Don/Doff right sock Lower body dressing/undressing: 0: Activity did not occur  FIM - Toileting Toileting steps completed by patient: Adjust clothing prior to toileting;Performs perineal hygiene;Adjust clothing after toileting Toileting Assistive Devices: Grab bar or rail for support Toileting: 4: Steadying assist  FIM - Diplomatic Services operational officer Devices: Grab bars Toilet Transfers: 4-To toilet/BSC: Min A (steadying Pt. > 75%);4-From toilet/BSC: Min A (steadying Pt. > 75%)  FIM - Bed/Chair Transfer Bed/Chair Transfer Assistive Devices: Therapist, occupational: 4: Supine > Sit: Min A (steadying Pt. > 75%/lift 1 leg);4: Sit > Supine: Min A (steadying pt. > 75%/lift 1 leg);4: Bed > Chair or W/C: Min A (steadying Pt. > 75%);4: Chair or W/C > Bed: Min A (steadying Pt. > 75%)  FIM - Locomotion: Wheelchair Locomotion: Wheelchair: 0: Activity did not occur  FIM -  Locomotion: Ambulation Locomotion: Ambulation Assistive Devices: Designer, industrial/product Ambulation/Gait Assistance: 4: Min assist Locomotion: Ambulation: 2: Travels 50 - 149 ft with minimal assistance (Pt.>75%)  Comprehension Comprehension Mode: Auditory Comprehension: 5-Follows basic conversation/direction: With extra time/assistive device  Expression Expression Mode: Verbal Expression: 5-Expresses basic needs/ideas: With no assist  Social Interaction Social Interaction: 4-Interacts appropriately 75 - 89% of the time - Needs redirection for appropriate language or to initiate interaction.  Problem Solving Problem Solving: 5-Solves complex 90% of the time/cues < 10% of the time  Memory Memory: 5-Recognizes or recalls 90% of the time/requires cueing < 10% of the time   Medical Problem List and Plan:  1. Thrombotic right basal ganglia infarction  2. DVT Prophylaxis/Anticoagulation: SCDs. Follow clinically for now. 3. Pain Management: Tylenol as needed  4. Neuropsych: This patient is capable of making decisions on her own behalf.  5. Hypertension. Lisinopril 10 mg daily. Normotensive at present 6. Tobacco abuse. NicoDerm patch. Provide counseling for this as well as secondary stroke prevention  7. Gram-negative rod UTI. Bactrim 09/28/2013--7 day course. Denies any dysuria or hematuria  8. Hyperlipidemia. Lipitor  9. FEN: encourage PO intake.   -will ask RD to assist with PO intake     LOS (Days) 2 A FACE TO FACE EVALUATION WAS PERFORMED  SWARTZ,ZACHARY T 10/05/2013 7:40 AM

## 2013-10-05 NOTE — Progress Notes (Signed)
Physical Therapy Session Note  Patient Details  Name: Dailah Opperman MRN: [id number] Date of Birth: [birth year]  Today's Date: 10/05/2013 Time: 1300-1330 Time Calculation (min): 30 min   Skilled Therapeutic Interventions/Progress Updates:  Patient in bed needs assistance to put gown on, Gait training to the therapy gym with walker,CGA and cues for step length and posture,patient presents with poor maneuvering around obstacles, short shuffling steps,and difficulty sequencing with AD.  Nu Step training 8 min with multiple rest breaks, easily distracted,increased need for cues to participate. Stairs training 2 x 4 steps with cues for sequencing. Patient returned to bd with all needs within reach, during session no c/o of pain.  Therapy Documentation Precautions:  Precautions Precautions: Fall Precaution Comments: L hemiparesis Restrictions Weight Bearing Restrictions: No  See FIM for current functional status  Therapy/Group: Individual Therapy  Dorna Mai 10/05/2013, 3:06 PM

## 2013-10-06 ENCOUNTER — Inpatient Hospital Stay (HOSPITAL_COMMUNITY): Payer: BC Managed Care – PPO

## 2013-10-06 ENCOUNTER — Inpatient Hospital Stay (HOSPITAL_COMMUNITY): Payer: BC Managed Care – PPO | Admitting: Physical Therapy

## 2013-10-06 ENCOUNTER — Inpatient Hospital Stay (HOSPITAL_COMMUNITY): Payer: BC Managed Care – PPO | Admitting: Occupational Therapy

## 2013-10-06 LAB — CBC WITH DIFFERENTIAL/PLATELET
Basophils Relative: 1 % (ref 0–1)
Eosinophils Absolute: 0 10*3/uL (ref 0.0–0.7)
Eosinophils Relative: 1 % (ref 0–5)
Hemoglobin: 14.2 g/dL (ref 12.0–15.0)
Lymphs Abs: 0.8 10*3/uL (ref 0.7–4.0)
MCH: 30.3 pg (ref 26.0–34.0)
MCHC: 34.3 g/dL (ref 30.0–36.0)
MCV: 88.3 fL (ref 78.0–100.0)
Monocytes Relative: 29 % — ABNORMAL HIGH (ref 3–12)
Neutro Abs: 1.2 10*3/uL — ABNORMAL LOW (ref 1.7–7.7)
Platelets: 228 10*3/uL (ref 150–400)
RBC: 4.69 MIL/uL (ref 3.87–5.11)

## 2013-10-06 LAB — BASIC METABOLIC PANEL
CO2: 24 mEq/L (ref 19–32)
CO2: 24 mEq/L (ref 19–32)
Chloride: 94 mEq/L — ABNORMAL LOW (ref 96–112)
Creatinine, Ser: 2.09 mg/dL — ABNORMAL HIGH (ref 0.50–1.10)
Glucose, Bld: 131 mg/dL — ABNORMAL HIGH (ref 70–99)
Glucose, Bld: 97 mg/dL (ref 70–99)
Potassium: 6.8 mEq/L (ref 3.5–5.1)
Sodium: 129 mEq/L — ABNORMAL LOW (ref 135–145)
Sodium: 131 mEq/L — ABNORMAL LOW (ref 135–145)

## 2013-10-06 LAB — COMPREHENSIVE METABOLIC PANEL
ALT: 14 U/L (ref 0–35)
Albumin: 3.8 g/dL (ref 3.5–5.2)
BUN: 36 mg/dL — ABNORMAL HIGH (ref 6–23)
Calcium: 9.8 mg/dL (ref 8.4–10.5)
GFR calc Af Amer: 27 mL/min — ABNORMAL LOW (ref >90)
Glucose, Bld: 95 mg/dL (ref 70–99)
Sodium: 128 mEq/L — ABNORMAL LOW (ref 135–145)
Total Protein: 8.2 g/dL (ref 6.0–8.3)

## 2013-10-06 MED ORDER — SODIUM CHLORIDE 0.9 % IV BOLUS (SEPSIS)
500.0000 mL | Freq: Once | INTRAVENOUS | Status: AC
  Administered 2013-10-06: 500 mL via INTRAVENOUS

## 2013-10-06 MED ORDER — SODIUM POLYSTYRENE SULFONATE 15 GM/60ML PO SUSP
30.0000 g | Freq: Once | ORAL | Status: AC
  Administered 2013-10-06: 30 g via ORAL
  Filled 2013-10-06: qty 120

## 2013-10-06 MED ORDER — SODIUM CHLORIDE 0.9 % IV SOLN
INTRAVENOUS | Status: DC
  Administered 2013-10-06: 19:00:00 via INTRAVENOUS
  Administered 2013-10-07: 500 mL via INTRAVENOUS

## 2013-10-06 MED ORDER — BOOST / RESOURCE BREEZE PO LIQD
1.0000 | ORAL | Status: DC
  Administered 2013-10-07 – 2013-10-12 (×2): 1 via ORAL

## 2013-10-06 NOTE — Progress Notes (Signed)
Occupational Therapy Session Note  Patient Details  Name: Erika Wang MRN: [id number] Date of Birth: November 27, 1950  Today's Date: 10/06/2013 Time: 0900-1000 Time Calculation (min): 60 min  Short Term Goals: Week 1:  OT Short Term Goal 1 (Week 1): Pt will complete toilet transfer with supervision and min verbal cues OT Short Term Goal 2 (Week 1): Pt will complet bathing with supervision and min verbal cues for safety OT Short Term Goal 3 (Week 1): Pt will complete home management task with supervision and min verbal cues for safety OT Short Term Goal 4 (Week 1): Pt will attend to L side during therapy session with min cues  Skilled Therapeutic Interventions/Progress Updates:      Pt seen for BADL retraining of bathing and dressing with a focus on left UE coordination, left side awareness, functional mobility, and standing balance. Pt declined need to toilet and declined a shower. Pt ambulated bed to sink with RW and stood for several minutes to initiate bathing and then felt light headed. Her blood pressure was checked, 129/82. Pt rested then resumed her activities with steadying assist as pt has a left lean in standing.  She did respond to cues well to maintain midline.  Pt had difficulty managing her underwear over her left hip and was not aware that it was not fully pulled up. Pt then sat in w/c to engage in bilateral coordination exercises. She has full AROM of LUE with 4/5 strength but has great difficulty with smooth movements and moving arms symetrically. She also needs mod to max cues to visually attend to her left side during basic exercises.  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  Pt resting in chair at end of session with call light in reach.  Therapy Documentation Precautions:  Precautions Precautions: Fall Precaution Comments: L hemiparesis Restrictions Weight Bearing  Restrictions: No    Vital Signs: Therapy Vitals BP: 129/82 mmHg Patient Position, if appropriate: Sitting Pain: Pain Assessment Pain Assessment: No/denies pain Pain Score: 0-No pain Patients Stated Pain Goal: 3 Multiple Pain Sites: No ADL:  See FIM for current functional status  Therapy/Group: Individual Therapy  SAGUIER,JULIA 10/06/2013, 11:28 AM

## 2013-10-06 NOTE — Progress Notes (Signed)
Physical Therapy Session Note  Patient Details  Name: Erika Wang MRN: [id number] Date of Birth: 11-Jan-1951  Today's Date: 10/06/2013 Time: 0865-7846 Time Calculation (min): 33 min  Short Term Goals: Week 1:  PT Short Term Goal 1 (Week 1): Patient will be able to perform bed mobility independently. PT Short Term Goal 2 (Week 1): Patient will be able to perform transfers with S/Mod-Independent. PT Short Term Goal 3 (Week 1): Patient will be able to perform up/down 2 steps without handrail with S/Mod-Independent assist with LRAD. PT Short Term Goal 4 (Week 1): Patient will be able to perform gait using LRAD x 200' with S/Mod-I.  Skilled Therapeutic Interventions/Progress Updates:    Pt received seated in bedside chair; agreeable to PT. Session focused on increasing pt safety with functional transfers. Gait x75' total within room with rolling walker requiring min guard for stability/balance; verbal cueing to remind pt to maintain bilat feet positioned within posterior legs of rolling walker; verbal reinforcement required. Pt performed multiple stand-pivot transfers from bed<>w/c with rolling walker and min A; verbal cueing for safe hand placement, proper use of assistive device. Toilet transfer with rolling walker, grab bars with min A for stability/balance and effective within-session carryover of proper hand placement, safe use of rolling walker. Sit>supine with HOB flat using bed rail requiring supervision. Scooting to [facility] with supervision, demonstration/verbal cueing for technique, hand placement. Therapist departed with pt semi-reclined in bed with 3 bed rails up, bed alarm on, and all needs within reach.  Therapy Documentation Precautions:  Precautions Precautions: Fall Precaution Comments: L hemiparesis Restrictions Weight Bearing Restrictions: No Vital Signs: Therapy Vitals Temp: 98.2 F (36.8 C) Temp src: Oral Pulse Rate: 70 Resp: 17 BP: 126/78 mmHg Patient Position, if  appropriate: Sitting Oxygen Therapy SpO2: 98 % Pain: Pain Assessment Pain Assessment: No/denies pain Pain Score: 0-No pain Locomotion : Ambulation Ambulation/Gait Assistance: 4: Min guard   See FIM for current functional status  Therapy/Group: Individual Therapy  Hobble, Lorenda Ishihara 10/06/2013, 3:53 PM

## 2013-10-06 NOTE — Progress Notes (Signed)
Physical Therapy Note  Patient Details  Name: Erika Wang MRN: [id number] Date of Birth: 26-Jun-1951 Today's Date: 10/06/2013  1002-1044, 42 min 1350-1445, 55 min  individual therapy No pain reported  Tx 1:  Gait room to gym with RW,  1 standing rest break, VCs for L longer step length, attention to L  Hand, with supervision> min asssit.  Pt reported she had to have BM.  Returned to room via w/c; toilet transfer and hygiene with supervision, clothing mgt with assistance for L inattentiohn.  neuromuscular re-education via demo, forced use for L stance stability during L sidestepping and L sustained standing without trunk support, during L hand retrieving activity at kitchen cabinets of ADL apt.  Pt became nauseated and vomited copious amounts liquid.  Returned to room, basic transfer with min guard assit.  Sit> supine with mod assist for bil LEs.  Bed alarm set and all needs within reach; IV nurse starting IV.  Tx 2:  Pt sound asleep, somewhat difficult to arouse.  No pain reported.  neuromuscular re-education via demo, forced use for sit>< stand without use of UEs, LUE use as gross assist during bil UE task in standing, supine bil and L unilateral bridging, standing calf raises and toe raises, gait without use of AD, x 150' min assist, mod VCs for L attention, L foot clearance and = step length  Therapeutic activities: pt sorted through her personal papers, wallet, etc in standing, deciding where to store them in room with min assist for drifting and LOB L; scooting up in bed, rolling and supine>< sit.   Pt left reclined in recliner with quick release belt in place, call bell and phone within reach.   See FIM for details. Tully Mcinturff 10/06/2013, 10:45 AM

## 2013-10-06 NOTE — Significant Event (Signed)
criticCRITICAL VALUE ALERT  Critical value receivedResults for LEIYAH, MAULTSBY (MRN [id number]) as of 10/06/2013 13:56  Ref. Range 10/06/2013 12:44  Sodium Latest Range: 135-145 mEq/L 129 (L)  Potassium Latest Range: 3.5-5.1 mEq/L 6.5 (HH)  Chloride Latest Range: 96-112 mEq/L 94 (L)  CO2 Latest Range: 19-32 mEq/L 24  BUN Latest Range: 6-23 mg/dL 37 (H)  Creatinine Latest Range: 0.50-1.10 mg/dL 4.09 (H)  Calcium Latest Range: 8.4-10.5 mg/dL 81.1  GFR calc non Af Amer Latest Range: >90 mL/min 22 (L)  GFR calc Af Amer Latest Range: >90 mL/min 26 (L)  Glucose Latest Range: 70-99 mg/dL 914 (H)  :    Date of notification:  10/06/13  Time of notification:  1358  Critical value read back:yes  Nurse who received alert:  Lurena Joiner   MD notified (1st page):  Deatra Ina, PA  Time of first page:  1359  MD notified (2nd page):n/a  Time of second page:n/a  Responding MD:  Deatra Ina, PA  Time MD responded:  1358, patient did not want to drink Kayexalate this am, only took 3/4 then sick with small amount of emesis, repeat lab as above, Dan Angiulli, Pa to come and speak to patient as patient declining to have enema for Kayexalate or to drink repeat dose at this time. Patient will attempt again after therapy. Roberts-VonCannon, Rodert Hinch Elon Jester

## 2013-10-06 NOTE — Care Management Note (Signed)
[facility] Individual Statement of Services  Patient Name:  Erika Wang  Date:  10/06/2013  Welcome to the [facility].  Our goal is to provide you with an individualized program based on your diagnosis and situation, designed to meet your specific needs.  With this comprehensive rehabilitation program, you will be expected to participate in at least 3 hours of rehabilitation therapies Monday-Friday, with modified therapy programming on the weekends.  Your rehabilitation program will include the following services:  Physical Therapy (PT), Occupational Therapy (OT), Speech Therapy (ST), 24 hour per day rehabilitation nursing, Therapeutic Recreaction (TR), Neuropsychology, Case Management (Social Worker), Rehabilitation Medicine, Nutrition Services and Pharmacy Services  Weekly team conferences will be held on Wednesday to discuss your progress.  Your Social Worker will talk with you frequently to get your input and to update you on team discussions.  Team conferences with you and your family in attendance may also be held.  Expected length of stay: 13-17 days  Overall anticipated outcome: mod/i level  Depending on your progress and recovery, your program may change. Your Social Worker will coordinate services and will keep you informed of any changes. Your Social Worker's name and contact numbers are listed  below.  The following services may also be recommended but are not provided by the [facility]:   Driving Evaluations  Home Health Rehabiltiation Services  Outpatient Rehabilitation Services  Vocational Rehabilitation   Arrangements will be made to provide these services after discharge if needed.  Arrangements include referral to agencies that provide these services.  Your insurance has been verified to be:  [employer] Your primary doctor is:  None  Pertinent information will be shared with your doctor and your insurance  company.  Social Worker:  Dossie Der, SW [phone] or [phone]  Information discussed with and copy given to patient by: Lucy Chris, 10/06/2013, 10:57 AM

## 2013-10-06 NOTE — Progress Notes (Signed)
Patient took approximately 3/4 of dose of Kayexalate by mouth, became nauseated and had small amount of emesis, pt refuses to take anymore of the medication, ginger ale given to the patient at her request to settle stomach. This writer could not obtain IV access, IV team called and up to start IV access, unable to obtain site, second IV team nurse called to attempt. Await her arrival. Erika Ina, PA made aware of above, no new orders at this time, will assess again when lab repeated this afternoon. Roberts-VonCannon, Erika Wang

## 2013-10-06 NOTE — Significant Event (Signed)
    Component Value Range & Units Status   Sodium 131 (L) 135 - 145 mEq/L Final   Potassium 6.8 (HH) 3.5 - 5.1 mEq/L Final   CRITICAL RESULT CALLED TO, READ BACK BY AND VERIFIED WITH: RN Harmon Pier 10/06/13 1757 KERAN M.   Chloride 97 96 - 112 mEq/L Final   CO2 24 19 - 32 mEq/L Final   Glucose, Bld 97 70 - 99 mg/dL Final   BUN 37 (H) 6 - 23 mg/dL Final   Creatinine, Ser 2.09 (H) 0.50 - 1.10 mg/dL Final   Calcium 9.9 8.4 - 10.5 mg/dL Final   GFR calc non Af Amer 25 (L) >90 mL/min Final   GFR calc Af Amer 29 (L) >90 mL/min Final   (NOTE)   Discussed with Deatra Ina, PA via phone above labs and that patient continues to refuse to take kayexalate via mouth or enema despite discussion of risk of heart attack, heart stopping, death due to high potassium levels. Had discussion of possible options for administration of other medications to lower levels and consequences of not treating the level. Pa to call daughter of patient and will get back to me on plan. Roberts-VonCannon, Kassia Demarinis Elon Jester

## 2013-10-06 NOTE — Progress Notes (Signed)
Social Work Assessment and Plan Social Work Assessment and Plan  Patient Details  Name: Erika Wang MRN: [id number] Date of Birth: 08-29-1951  Today's Date: 10/06/2013  Problem List:  Patient Active Problem List   Diagnosis Date Noted  . CVA (cerebral infarction) 10/03/2013  . Infection of urinary tract 10/02/2013  . Cigar smoker motivated to quit 10/02/2013  . 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 Left hemiparesis 10/02/2013  . Encounter for long-term (current) use of medications 10/02/2013  . Stroke 09/28/2013   Past Medical History: No past medical history on file. Past Surgical History:  Past Surgical History  Procedure Laterality Date  . Cesarean section     Social History:  reports that she has been smoking.  She does not have any smokeless tobacco history on file. Her alcohol and drug histories are not on file.  Family / Support Systems Marital Status: Single Patient Roles: Parent;Other (Comment) (Employee) Children: Danley Danker  [phone] Other Supports: Daughter in [place] Anticipated Caregiver: If necessary daughter here Ability/Limitations of Caregiver: Daughter recenlty gave birth has a 46 week old at home Caregiver Availability: Intermittent Family Dynamics: Close knit with her daughter's but wants to be independent and on her own.  She wants to go back to her home in [facility], but only has friends checking in on her there.  She is used to takng care of herself and not needing help from others.  Social History Preferred language: English Religion:  Cultural Background: No issues Education: High School Read: Yes Write: Yes Employment Status: Employed Name of Employer: Systems developer Return to Work Plans: Plans to retrun to work, doesn';t have any other options.  She wants to apply for SSD but infomred how long the process will take. Legal Hisotry/Current Legal Issues: No issues Guardian/Conservator: None-according to MD pt is capable of making her own  decisions while here.   Abuse/Neglect Physical Abuse: Denies Verbal Abuse: Denies Sexual Abuse: Denies Exploitation of patient/patient's resources: Denies Self-Neglect: Denies  Emotional Status Pt's affect, behavior adn adjustment status: Pt is motivated to improve and get back home, she doesn't like hospitals and is not used to being in them.  She is pleased with the progress and does not want to stay here as long as team thinks-13-17 days.  Will see how she progresses while here. Recent Psychosocial Issues: Feels she was healthy prior to admission but also doesn;t go to the MD Pyschiatric History: No history-deferred depression screen at this time due to pt not feeling well, elevated potassium and working on this coming down.  Will monitor while here and work along with team. Substance Abuse History: Tobacco unsure if she plans to quit smoking she has done this for a long time.  Aware of the risks involved.  Patient / Family Perceptions, Expectations & Goals Pt/Family understanding of illness & functional limitations: Pt is able to explain her stroke and deficits and is encouraged by the progress she has made already.  She plans to go to her home at discharge and manage from there.  Will need to see if this is a safe option Premorbid pt/family roles/activities: Mother, Grandmother, Friend, Employee, etc Anticipated changes in roles/activities/participation: resume Pt/family expectations/goals: Pt states: " I want to go back to my home and not burden my daughter."  " I have always been independent, I will again."  [employer]: None Premorbid Home Care/DME Agencies: None Transportation available at discharge: Family Resource referrals recommended: Support group (specify) (CVA Support group)  Discharge Planning Living  Arrangements: Alone Support Systems: Children;Friends/neighbors;Church/faith community Type of Residence: Private residence Insurance Resources:  Media planner (specify) Herbalist) Financial Resources: Employment Financial Screen Referred: No Living Expenses: Rent Money Management: Patient Does the patient have any problems obtaining your medications?: No Home Management: Self Patient/Family Preliminary Plans: Return home if mod/i level or go to her daughter's here if necessary.  She wants to go back to her home.  She is concerned about her bills and her job.  Discussed SSD application but also the length of time it takes to process this application.  Will work on best plan. Social Work Anticipated Follow Up Needs: HH/OP;Support Group  Clinical Impression Pleasant independent female who is head strong about going home when she leaves here.  She does not plan to go to daughter's home especially with a new baby. She feels she can manage at home and have friends check on her.  Team evaluations set mod/i level goals but she will not be able to drive, she will see how she will get back Home.  Work on discharge plan.  Will give her SSD application  Lucy Chris 10/06/2013, 1:06 PM

## 2013-10-06 NOTE — Progress Notes (Signed)
Patient's daughter in to see patient and discuss taking kayexalate to bring levels down, pt given Zofran 4 mg IV then given 30 grams kayexalate po without difficulty. Roberts-VonCannon, Rosina Cressler Elon Jester

## 2013-10-06 NOTE — Plan of Care (Signed)
Problem: RH KNOWLEDGE DEFICIT Goal: RH STG INCREASE KNOWLEDGE OF DYSPHAGIA/FLUID INTAKE Patient will be able to verbalize independently strategies used to manage dysphagia at home.  Outcome: Progressing Diet advanced to Regular, Thin, Intermittent supervision, medications whole with puree

## 2013-10-06 NOTE — Progress Notes (Signed)
Speech Language Pathology Daily Session Note  Patient Details  Name: Erika Wang MRN: [id number] Date of Birth: 06/02/1951  Today's Date: 10/06/2013 Time: 1140-1205 Time Calculation (min): 25 min  Short Term Goals: Week 1: SLP Short Term Goal 1 (Week 1): Pt will demonstrate adequate mastication and oral clearance with regular textures with Min cues to assess readiness for diet advancement, SLP Short Term Goal 2 (Week 1): Pt will utilize external memory aids to recall new information with Supervision SLP Short Term Goal 3 (Week 1): Pt will demonstrate adequate medication management with use of external aids with Supervision SLP Short Term Goal 4 (Week 1): Pt will demonstrate emergent awareness of physical and cognitive impairments with Supervision SLP Short Term Goal 5 (Week 1): Pt will demonstrate adequate problem solving with mildly complex tasks with Supervision  Skilled Therapeutic Interventions: Skilled treatment focused on cognitive and swallowing goals. SLP facilitated session by creating external aids for schedule and medications. Pt was able to utilize both external aids with Supervision question cues. Pt consumed french fries and a ham and cheese sandwich wiht adequate oral clearance and mildly prolonged mastication, which SLP suspects is pt's baseline due to missing dentition. Pt believes she is at her baseline with mastication as well, and per RN pt has not been eating much food due to not liking the textures. Recommend to initiate trial of regular textures and thin liquids to facilitate intake.    FIM:  Comprehension Comprehension Mode: Auditory Comprehension: 5-Follows basic conversation/direction: With extra time/assistive device Expression Expression Mode: Verbal Expression: 5-Expresses basic needs/ideas: With no assist Social Interaction Social Interaction: 4-Interacts appropriately 75 - 89% of the time - Needs redirection for appropriate language or to initiate  interaction. Problem Solving Problem Solving: 5-Solves basic problems: With no assist Memory Memory: 5-Recognizes or recalls 90% of the time/requires cueing < 10% of the time FIM - Eating Eating Activity: 5: Set-up assist for open containers;6: Modified consistency diet: (comment) (dys 3, meats chopped)  Pain Pain Assessment Pain Assessment: No/denies pain  Therapy/Group: Individual Therapy   Maxcine Ham, M.A. CCC-SLP [phone]   Maxcine Ham 10/06/2013, 12:38 PM

## 2013-10-06 NOTE — Progress Notes (Addendum)
Subjective/Complaints: Slept well. Tired of sleeping on back, RN notes low systolic. Pt denies dizziness A 12 point review of systems has been performed and if not noted above is otherwise negative.   Objective: Vital Signs: Blood pressure 94/52, pulse 71, temperature 98 F (36.7 C), temperature source Oral, resp. rate 18, height 5' (1.524 m), weight 50.4 kg (111 lb 1.8 oz), SpO2 96.00%. No results found. No results found for this basename: WBC, HGB, HCT, PLT,  in the last 72 hours No results found for this basename: NA, K, CL, CO, GLUCOSE, BUN, CREATININE, CALCIUM,  in the last 72 hours CBG (last 3)  No results found for this basename: GLUCAP,  in the last 72 hours  Wt Readings from Last 3 Encounters:  10/03/13 50.4 kg (111 lb 1.8 oz)  10/01/13 54.885 kg (121 lb)    Physical Exam:  Constitutional: She is oriented to person, place, and time. She is frail, side lying in bed HENT:  Head: Normocephalic.  Poor dentition, mucosa moist  Eyes: EOM are normal.  Neck: Normal range of motion. Neck supple. No thyromegaly present.  Cardiovascular: Normal rate and regular rhythm. No murmur  Respiratory: Effort normal and breath sounds normal. No respiratory distress. No wheezes or rales  GI: Soft. Bowel sounds are normal. She exhibits no distension. No pain Neurological: She is alert and oriented to person, place, and time. HOH Follows commands. Fair insight to her deficits. Left facial weakness and tongue deviation. Speech still slurred but intelligible. LUE is 2+/5 deltoid, 3- to 3 at bicep and tricep, 3/5 HI. LLE is 3 HF and 3+ KE, Ankle 3-4/5. No gross sensory deficits. Cognitively within normal limits.  Skin: Skin is warm and dry.  Psychiatric: She has a normal mood and affect. Her behavior is normal. Judgment and thought content normal   Assessment/Plan: 1. Functional deficits secondary to thrombotic right basal ganglia infarct which require 3+ hours per day of interdisciplinary  therapy in a comprehensive inpatient rehab setting. Physiatrist is providing close team supervision and 24 hour management of active medical problems listed below. Physiatrist and rehab team continue to assess barriers to discharge/monitor patient progress toward functional and medical goals. FIM: FIM - Bathing Bathing Steps Patient Completed: Chest;Right Arm;Left Arm;Abdomen;Left upper leg;Right upper leg;Buttocks;Right lower leg (including foot);Left lower leg (including foot);Front perineal area Bathing: 0: Activity did not occur  FIM - Upper Body Dressing/Undressing Upper body dressing/undressing steps patient completed: Thread/unthread right bra strap;Hook/unhook bra;Thread/unthread right sleeve of pullover shirt/dresss;Thread/unthread left sleeve of pullover shirt/dress;Put head through opening of pull over shirt/dress;Pull shirt over trunk Upper body dressing/undressing: 0: Activity did not occur FIM - Lower Body Dressing/Undressing Lower body dressing/undressing steps patient completed: Thread/unthread right pants leg;Pull pants up/down;Don/Doff right sock Lower body dressing/undressing: 0: Activity did not occur  FIM - Toileting Toileting steps completed by patient: Adjust clothing prior to toileting;Performs perineal hygiene;Adjust clothing after toileting Toileting Assistive Devices: Grab bar or rail for support Toileting: 4: Steadying assist  FIM - Diplomatic Services operational officer Devices: Grab bars Toilet Transfers: 4-To toilet/BSC: Min A (steadying Pt. > 75%);4-From toilet/BSC: Min A (steadying Pt. > 75%)  FIM - Bed/Chair Transfer Bed/Chair Transfer Assistive Devices: Therapist, occupational: 4: Supine > Sit: Min A (steadying Pt. > 75%/lift 1 leg);4: Sit > Supine: Min A (steadying pt. > 75%/lift 1 leg);4: Bed > Chair or W/C: Min A (steadying Pt. > 75%);4: Chair or W/C > Bed: Min A (steadying Pt. > 75%)  FIM - Locomotion: Wheelchair Locomotion:  Wheelchair: 0:  Activity did not occur FIM - Locomotion: Ambulation Locomotion: Ambulation Assistive Devices: Designer, industrial/product Ambulation/Gait Assistance: 4: Min assist Locomotion: Ambulation: 2: Travels 50 - 149 ft with minimal assistance (Pt.>75%)  Comprehension Comprehension Mode: Auditory Comprehension: 5-Follows basic conversation/direction: With extra time/assistive device  Expression Expression Mode: Verbal Expression: 5-Expresses basic needs/ideas: With no assist  Social Interaction Social Interaction: 4-Interacts appropriately 75 - 89% of the time - Needs redirection for appropriate language or to initiate interaction.  Problem Solving Problem Solving: 5-Solves complex 90% of the time/cues < 10% of the time  Memory Memory: 5-Recognizes or recalls 90% of the time/requires cueing < 10% of the time   Medical Problem List and Plan:  1. Thrombotic right basal ganglia infarction  2. DVT Prophylaxis/Anticoagulation: SCDs. Follow clinically for now. 3. Pain Management: Tylenol as needed  4. Neuropsych: This patient is capable of making decisions on her own behalf.  5. Hypertension. Lisinopril 10 mg daily. Normotensive at present 6. Tobacco abuse. NicoDerm patch. Provide counseling for this as well as secondary stroke prevention  7. Gram-negative rod UTI. Bactrim 09/28/2013--7 day course. Afebrile off abx Denies any dysuria or hematuria  8. Hyperlipidemia. Lipitor  9. FEN: encourage PO intake.   -will ask RD to assist with PO intake 10.  ARF secondary to ACE-I NS bolus, D/C ACE, kayexalate    LOS (Days) 3 A FACE TO FACE EVALUATION WAS PERFORMED  Erick Colace 10/06/2013 6:23 AM

## 2013-10-06 NOTE — Progress Notes (Signed)
INITIAL NUTRITION ASSESSMENT  DOCUMENTATION CODES Per approved criteria  -Not Applicable   INTERVENTION: Add Resource Breeze po daily, each supplement provides 250 kcal and 9 grams of protein. Allow family to bring in foods for patient - per RN, pt with specific food preferences. RD to continue to follow nutrition care plan.  NUTRITION DIAGNOSIS: Inadequate oral intake related to variable appetite and food preferences as evidenced by poor oral intake.   Goal: Intake to meet >90% of estimated nutrition needs.  Monitor:  weight trends, lab trends, I/O's, PO intake, supplement tolerance  Reason for Assessment: MD Consult for Poor PO Intake  62 y.o. female  Admitting Dx: CVA  ASSESSMENT: PMHx significant for tobacco abuse. Admitted s/p R basal ganglia infarct. Pt with poor PO intake. SLP completed BSE on 12/6. Pt is currently consuming Dysphagia 3 diet with chopped meats and thin liquids. Pt with missing dentition.  Pt with elevated potassium of 6.3. Was given Kayexalate today, pt consumed 3/4 of a dose by mouth, became nauseous and vomited a small amount.  Discussed oral intake with patient and RN. RN provides most of information as pt fell asleep during conversation x 2. Pt prefers soups and other foods that family is bringing from [place]. Per RN, family is aware that hospital menu has similar foods as [employer], however family continues to [employer]. Pt agreeable to trying [employer].  Height: Ht Readings from Last 1 Encounters:  10/03/13 5' (1.524 m)    Weight: Wt Readings from Last 1 Encounters:  10/03/13 111 lb 1.8 oz (50.4 kg)    Ideal Body Weight: 100 lb  % Ideal Body Weight: 111%  Wt Readings from Last 10 Encounters:  10/03/13 111 lb 1.8 oz (50.4 kg)  10/01/13 121 lb (54.885 kg)    Usual Body Weight: 115 lb (per pt)  % Usual Body Weight: 97%  BMI:  Body mass index is 21.7 kg/(m^2). Normal weight  Estimated Nutritional Needs: Kcal:  1500 - 1700 Protein: 60 - 70 g Fluid: 1.5 - 1.7 liters  Skin: intact  Diet Order: Dysphagia 3; chopped meats  EDUCATION NEEDS: -No education needs identified at this time   Intake/Output Summary (Last 24 hours) at 10/06/13 1059 Last data filed at 10/06/13 0800  Gross per 24 hour  Intake    240 ml  Output      0 ml  Net    240 ml    Last BM: 12/8  Labs:   Recent Labs Lab 09/29/13 1250 10/06/13 0555  NA 138 128*  K 4.2 6.3*  CL 102 93*  CO2 22 26  BUN 11 36*  CREATININE 0.78 2.19*  CALCIUM 9.0 9.8  GLUCOSE 133* 95    CBG (last 3)  No results found for this basename: GLUCAP,  in the last 72 hours  Scheduled Meds: . aspirin EC  325 mg Oral Daily  . atorvastatin  10 mg Oral q1800  . nicotine  7 mg Transdermal Daily  . pantoprazole  40 mg Oral Daily  . sodium chloride  500 mL Intravenous Once    Continuous Infusions:   No past medical history on file.  Past Surgical History  Procedure Laterality Date  . Cesarean section      Jarold Motto MS, RD, LDN Pager: [phone] After-hours pager: [phone]

## 2013-10-06 NOTE — Progress Notes (Signed)
D- Patient took one - two sips of Kayexalate and stated" This makes my stomach sick I can't do this, potassium is good for my heart" Discussed level too high and reinforced information given to patient earlier by this writer and Deatra Ina, PA that too much can have a negative effect on her cardiovascular system, offered to ask about doing it via enema and patient declined this as well.  A -R- Dan Angiulli, PA made aware of above and that patient refusing to take any more of the medication. No new orders given at this time. Roberts-VonCannon, Erika Wang

## 2013-10-06 NOTE — Progress Notes (Signed)
Patient information reviewed and entered into eRehab system by Ozzie Knobel, RN, CRRN, PPS Coordinator.  Information including medical coding and functional independence measure will be reviewed and updated through discharge.    

## 2013-10-06 NOTE — IPOC Note (Signed)
Overall Plan of Care [facility]) Patient Details Name: Erika Wang MRN: [id number] DOB: 10-20-1951  Admitting Diagnosis: BG-CVA  Hospital Problems: Active Problems:   CVA (cerebral infarction)     Functional Problem List: Nursing Bladder;Bowel;Endurance;Medication Management;Pain;Nutrition;Safety  PT Balance;Behavior;Motor;Safety  OT Balance;Cognition;Motor;Safety;Behavior;Perception  SLP Cognition  TR         Basic ADL's: OT Grooming;Bathing;Dressing;Toileting;Eating     Advanced  ADL's: OT Simple Meal Preparation;Laundry;Light Housekeeping     Transfers: PT Bed Mobility;Bed to Chair;Car;Furniture;Floor  OT Toilet;Tub/Shower     Locomotion: PT Ambulation;Stairs     Additional Impairments: OT Fuctional Use of Upper Extremity  SLP Swallowing;Social Cognition   Problem Solving;Memory;Awareness  TR      Anticipated Outcomes Item Anticipated Outcome  Self Feeding Mod I  Swallowing  Mod I with least restrictive PO   Basic self-care  Mod I  Toileting  Mod I    Bathroom Transfers Mod I   Bowel/Bladder  continent of bowel and bladder with toileting  modified independent  Transfers  Independent/Mod-independent  Locomotion  Mod-Independent with LRAD x 300'+  Communication     Cognition  Mod I  Pain  3or less  on scale of 1-10  Safety/Judgment  supervision   Therapy Plan: PT Intensity: Minimum of 1-2 x/day ,45 to 90 minutes PT Frequency: 5 out of 7 days PT Duration Estimated Length of Stay: 13 to 17 days OT Intensity: Minimum of 1-2 x/day, 45 to 90 minutes OT Frequency: 5 out of 7 days OT Duration/Estimated Length of Stay: 13-17 days SLP Intensity: Minumum of 1-2 x/day, 30 to 90 minutes SLP Frequency: 5 out of 7 days SLP Duration/Estimated Length of Stay: 13-17 days       Team Interventions: Nursing Interventions Patient/Family Education;Bladder Management;Bowel Management;Disease Management/Prevention;Pain Management;Medication Management;Discharge  Planning;Psychosocial Support;Dysphagia/Aspiration Precaution Training  PT interventions Ambulation/gait training;Balance/vestibular training;Cognitive remediation/compensation;DME/adaptive equipment instruction;Functional mobility training;Neuromuscular re-education;Patient/family education;Stair training;Therapeutic Activities;Therapeutic Exercise;UE/LE Strength taining/ROM;UE/LE Coordination activities  OT Interventions Balance/vestibular training;Cognitive remediation/compensation;Community reintegration;Discharge planning;DME/adaptive equipment instruction;Functional mobility training;Neuromuscular re-education;Patient/family education;Self Care/advanced ADL retraining;Therapeutic Activities;Therapeutic Exercise;UE/LE Strength taining/ROM;UE/LE Coordination activities  SLP Interventions Cognitive remediation/compensation;Cueing hierarchy;Dysphagia/aspiration precaution training;Environmental controls;Functional tasks;Internal/external aids;Medication managment;Patient/family education  TR Interventions    SW/CM Interventions      Team Discharge Planning: Destination: PT-Home ,OT- Home , SLP-Home Projected Follow-up: PT-Home health PT, OT-  Home health OT, SLP-Other (comment) (TBD) Projected Equipment Needs: PT-Cane, OT- Tub/shower bench, SLP-None recommended by SLP Equipment Details: PT-LRAD TBD closer to discharge, OT-  Patient/family involved in discharge planning: PT- Patient,  OT-Patient;Family member/caregiver, SLP-Patient  MD ELOS: 7-9 Medical Rehab Prognosis:  Excellent Assessment: 62 y.o. right-handed female with history of tobacco abuse and no scheduled medications at time of admission. Admitted 09/28/2013 with left-sided weakness. Patient independent prior to admission living alone in [facility] and was visiting her daughter in [place]. MRI showed right basal ganglia infarct. MRA of the head with anterior circulation negative except for some mild irregularity  compatible with atherosclerosis. Echocardiogram with ejection fraction of 70% no wall motion abnormalities. Carotid Dopplers with no ICA stenosis. Neurology services consulted patient did receive TPA. Placed on aspirin for CVA prophylaxis. Patient is maintained on a regular diet. Blood pressure monitored with the addition of lisinopril. NicoDerm patch added for history of tobacco abuse. Urine study 09/28/2013 greater than 100,000 gram-negative rods and placed on Bactrim.  Now requiring 24/7 Rehab RN,MD, as well as CIR level PT, OT and SLP.  Treatment team will focus on ADLs and mobility with goals set at [facility] I/Sup  See Team Conference Notes for weekly updates to the plan of care

## 2013-10-06 NOTE — Progress Notes (Signed)
CRITICAL VALUE ALERT  Critical value received:  Potassium   Date of notification:  10/06/13  Time of notification:  0708  Critical value read back:yes  Nurse who received alert:  Zerita Boers, RN  MD notified (1st page):  Dr. Wynn Banker  Time of first page:  [phone]  MD notified (2nd page):  Time of second page:  Responding MD:  Dr. Wynn Banker  Time MD responded:  [phone]

## 2013-10-06 NOTE — Plan of Care (Signed)
Problem: RH KNOWLEDGE DEFICIT Goal: RH STG INCREASE KNOWLEDGE OF HYPERTENSION Patient will be able to verbalize independently strategies used to manage high blood pressure at home. Now on medication was not PTA,  Outcome: Progressing Prinivil discontinued today

## 2013-10-06 NOTE — Progress Notes (Signed)
RN called regarding patient's labs from today.  K+ this am 6.3 then 6.5 at noon now 6.8.  Patient initially took some Kayexelate this am then refused because of nausea. Discussed additional options for lowering potassium. She has received 500 NS during day and her Lisinopril has been D/C'd.  Patient voided in various restrooms today with therapist assisting.  Unable to determine amount of urine per staff.  Patient Alert without complaint and easily conversant with staff. Jesusita Oka PA notified by Elon Jester of patient evening labs and patient status.  Will attempt PO Kayexalate, PA will call daughter to see she can help convince patient to take Kayexalate.  RN to call if assistance needed.

## 2013-10-07 ENCOUNTER — Inpatient Hospital Stay (HOSPITAL_COMMUNITY): Payer: BC Managed Care – PPO | Admitting: *Deleted

## 2013-10-07 ENCOUNTER — Inpatient Hospital Stay (HOSPITAL_COMMUNITY): Payer: BC Managed Care – PPO | Admitting: Occupational Therapy

## 2013-10-07 ENCOUNTER — Ambulatory Visit (HOSPITAL_COMMUNITY): Payer: BC Managed Care – PPO

## 2013-10-07 DIAGNOSIS — N179 Acute kidney failure, unspecified: Secondary | ICD-10-CM

## 2013-10-07 LAB — BASIC METABOLIC PANEL
BUN: 28 mg/dL — ABNORMAL HIGH (ref 6–23)
CO2: 26 mEq/L (ref 19–32)
Calcium: 9.2 mg/dL (ref 8.4–10.5)
Chloride: 99 mEq/L (ref 96–112)
Creatinine, Ser: 1.37 mg/dL — ABNORMAL HIGH (ref 0.50–1.10)
GFR calc Af Amer: 48 mL/min — ABNORMAL LOW (ref >90)
Glucose, Bld: 99 mg/dL (ref 70–99)
Potassium: 5.3 mEq/L — ABNORMAL HIGH (ref 3.5–5.1)

## 2013-10-07 NOTE — Progress Notes (Signed)
Physical Therapy Session Note  Patient Details  Name: Erika Wang MRN: [id number] Date of Birth: 1951/09/02  Today's Date: 10/07/2013 Time: 1005-1050 and 13:07-14:00 ( )  Time Calculation (min): 45 min  Short Term Goals: Week 1:  PT Short Term Goal 1 (Week 1): Patient will be able to perform bed mobility independently. PT Short Term Goal 2 (Week 1): Patient will be able to perform transfers with S/Mod-Independent. PT Short Term Goal 3 (Week 1): Patient will be able to perform up/down 2 steps without handrail with S/Mod-Independent assist with LRAD. PT Short Term Goal 4 (Week 1): Patient will be able to perform gait using LRAD x 200' with S/Mod-I.  Skilled Therapeutic Interventions/Progress Updates:  1:2 Pt sleeping in recliner, slow to wake, but agreeable. PT needed encouragement for full participation this morning. Tx focused on WC mobility, gait with/wihtout RW, static and dynamic balance, and transfer training.  WC<>mat transfers with min A due to several L LOB.  WC propulsion with R ehmi-technique. Attempted LUE as well, but pt unabloe to safely use LUE. Cues for turning technique, but pt repeatedly collided with L wall.   Serial sit<>stand wtihout UE support (although pt kept trying to use UE) 2x10 with cues for scooting to edge, and forward lean.   Static standing x2 min, standing with narrow BOS x89min, reaching placing objects with LUE outsude BOS without UE support.   Gait in controlled setting and carpet 2x120' with RW and without device and Min A overall. Pt give cues for LLE ext in stance as well as heel strike, but unable to adjust with cues.   2:2 Pt resting in bed, agreeable to bedside tx with encouragement due to no pants from episode of incontinence. Pt declining walk in hall even with 2 gowns. Tx focused on NMR for LLE activation, coordination, balance, and in rom navigation of tight spaces. Pt moving well poverall, but rather careless about balance and decreased  insight into deficits.   Supine: ankle pumps, bridging, sidelyign clamshells 2x10 with cues for technique Supine<>sit with S without rail Seated: LAQ with 5 sec holds and marching with 1.5# ankle weight, cues for performing in full ROM, 2x10  Sit<>stand with min A for steadying. Marching in place with cues for high knees with up to Mod A for LOB  Pt educated on importance of performing balance tasks for the purpose of improving balance and reducing falls risk. Educated pt on importance of reducing risk factors for additional CVAs   Gait in room wtihotu device, carrying palnt to water with Min A Gait with RW and close S with cues for safe RW management. Pt with no LOB, but S for safety.  Toilet transfer with min-guard.  Pt wanting to return to bed, no further questions.     Therapy Documentation Precautions:  Precautions Precautions: Fall Precaution Comments: L hemiparesis Restrictions Weight Bearing Restrictions: No   Pain: Pain Assessment Pain Assessment: No/denies pain    Locomotion : Ambulation Ambulation/Gait Assistance: 4: Min assist Wheelchair Mobility Distance: 100   See FIM for current functional status  Therapy/Group: Individual Therapy  Clydene Laming, PT, DPT   Eulogio Ditch, Lucelia Lacey M 10/07/2013, 1:19 PM

## 2013-10-07 NOTE — Progress Notes (Signed)
Speech Language Pathology Daily Session Note  Patient Details  Name: Erika Wang MRN: [id number] Date of Birth: Mar 25, 1951  Today's Date: 10/07/2013 Time: 1130-1215 Time Calculation (min): 45 min  Short Term Goals: Week 1: SLP Short Term Goal 1 (Week 1): Pt will demonstrate adequate mastication and oral clearance with regular textures with Min cues to assess readiness for diet advancement, SLP Short Term Goal 2 (Week 1): Pt will utilize external memory aids to recall new information with Supervision SLP Short Term Goal 3 (Week 1): Pt will demonstrate adequate medication management with use of external aids with Supervision SLP Short Term Goal 4 (Week 1): Pt will demonstrate emergent awareness of physical and cognitive impairments with Supervision SLP Short Term Goal 5 (Week 1): Pt will demonstrate adequate problem solving with mildly complex tasks with Supervision  Skilled Therapeutic Interventions: Treatment focused on cognitive and swallowing goals. SLP facilitated with skilled observation of lunch meal consisting of regular textures and thin liquids via straw. Pt consumed PO without overt s/s of aspiration with Mod I. Pt does not need intermittent suprvision at meals, and does not appear to need further SLP f/u; pt is in agreement.  Pt verbalized her need to use the restroom with Mod I; urgency reported to RN. She followed directions for toileting and cleaning with Mod I. SLP provided Supervision level question cues for recall of daily events, including medical care regarding her potassium levels. Continue plan of care.   FIM:  Comprehension Comprehension Mode: Auditory Comprehension: 5-Follows basic conversation/direction: With no assist Expression Expression Mode: Verbal Expression: 5-Expresses basic needs/ideas: With no assist Social Interaction Social Interaction: 4-Interacts appropriately 75 - 89% of the time - Needs redirection for appropriate language or to initiate  interaction. Problem Solving Problem Solving: 5-Solves basic problems: With no assist Memory Memory: 4-Recognizes or recalls 75 - 89% of the time/requires cueing 10 - 24% of the time FIM - Eating Eating Activity: 6: Swallowing techniques: self-managed  Pain Pain Assessment Pain Assessment: No/denies pain  Therapy/Group: Individual Therapy   Maxcine Ham, M.A. CCC-SLP [phone]   Maxcine Ham 10/07/2013, 1:24 PM

## 2013-10-07 NOTE — Progress Notes (Signed)
Occupational Therapy Session Note  Patient Details  Name: Erika Wang MRN: [id number] Date of Birth: 05-Sep-1951  Today's Date: 10/07/2013 Time: 0804-0900 Time Calculation (min): 56 min  Short Term Goals: Week 1:  OT Short Term Goal 1 (Week 1): Pt will complete toilet transfer with supervision and min verbal cues OT Short Term Goal 2 (Week 1): Pt will complet bathing with supervision and min verbal cues for safety OT Short Term Goal 3 (Week 1): Pt will complete home management task with supervision and min verbal cues for safety OT Short Term Goal 4 (Week 1): Pt will attend to L side during therapy session with min cues  Skilled Therapeutic Interventions/Progress Updates:      Pt seen for BADL retraining of bathing and dressing with a focus on left side awareness, left side coordination, balance, postural control, safety awareness. Pt declined the need to toilet. She needed encouragement to advance through each step today as she was very fatigued and seemed to have more difficulty processing through the steps today. Pt bathed at sink as she had an IV line and was able to dress with steady assist. She needed numerous cues to stand with wider BOS to increase stability in standing.  After self care was completed, pt worked on dynamic standing balance activities of side stepping L foot out and in with min A and cues for foot placement. Pt may have decreased proprioception in LLE. Pt also worked on stand><partial sit to develop LE motor control. She needed A to achieve symetrical movement as she hesitated flexing L hip.  Pt very fatigued after exercises, so she rested in recliner with feet elevated, QRB, and call light in place.  Therapy Documentation Precautions:  Precautions Precautions: Fall Precaution Comments: L hemiparesis Restrictions Weight Bearing Restrictions: No       Pain: Pain Assessment Pain Assessment: No/denies pain ADL:  See FIM for current functional  status  Therapy/Group: Individual Therapy  Lakethia Coppess 10/07/2013, 11:06 AM

## 2013-10-07 NOTE — Progress Notes (Signed)
Subjective/Complaints: Oral meals 5-50% Po fluids~726ml yesterday Refused Kayexalate and blood draws and IVs but later agreed, delayed treatment A 12 point review of systems has been performed and if not noted above is otherwise negative.   Objective: Vital Signs: Blood pressure 154/84, pulse 61, temperature 98.5 F (36.9 C), temperature source Oral, resp. rate 18, height 5' (1.524 m), weight 50.4 kg (111 lb 1.8 oz), SpO2 99.00%. No results found.  Recent Labs  10/06/13 0555  WBC 2.9*  HGB 14.2  HCT 41.4  PLT 228    Recent Labs  10/06/13 1244 10/06/13 1650  NA 129* 131*  K 6.5* 6.8*  CL 94* 97  GLUCOSE 131* 97  BUN 37* 37*  CREATININE 2.26* 2.09*  CALCIUM 10.1 9.9   CBG (last 3)  No results found for this basename: GLUCAP,  in the last 72 hours  Wt Readings from Last 3 Encounters:  10/03/13 50.4 kg (111 lb 1.8 oz)  10/01/13 54.885 kg (121 lb)    Physical Exam:  Constitutional: She is oriented to person, place, and time. She is frail, side lying in bed HENT:  Head: Normocephalic.  Poor dentition, mucosa moist  Eyes: EOM are normal.  Neck: Normal range of motion. Neck supple. No thyromegaly present.  Cardiovascular: Normal rate and regular rhythm. No murmur  Respiratory: Effort normal and breath sounds normal. No respiratory distress. No wheezes or rales  GI: Soft. Bowel sounds are normal. She exhibits no distension. No pain Neurological: She is alert and oriented to person, place, and time. HOH Follows commands. Fair insight to her deficits. Left facial weakness and tongue deviation. Speech still slurred but intelligible. LUE is 2+/5 deltoid, 3- to 3 at bicep and tricep, 3/5 HI. LLE is 3 HF and 3+ KE, Ankle 3-4/5. No gross sensory deficits. Cognitively within normal limits.  Skin: Skin is warm and dry.  Psychiatric: She has a normal mood and affect. Her behavior is normal. Judgment and thought content normal   Assessment/Plan: 1. Functional deficits  secondary to thrombotic right basal ganglia infarct which require 3+ hours per day of interdisciplinary therapy in a comprehensive inpatient rehab setting. Physiatrist is providing close team supervision and 24 hour management of active medical problems listed below. Physiatrist and rehab team continue to assess barriers to discharge/monitor patient progress toward functional and medical goals. FIM: FIM - Bathing Bathing Steps Patient Completed: Chest;Right Arm;Left Arm;Abdomen;Left upper leg;Right upper leg;Buttocks;Right lower leg (including foot);Left lower leg (including foot);Front perineal area Bathing: 4: Steadying assist  FIM - Upper Body Dressing/Undressing Upper body dressing/undressing steps patient completed: Thread/unthread right bra strap;Hook/unhook bra;Thread/unthread right sleeve of pullover shirt/dresss;Thread/unthread left sleeve of pullover shirt/dress;Put head through opening of pull over shirt/dress;Pull shirt over trunk Upper body dressing/undressing: 0: Activity did not occur FIM - Lower Body Dressing/Undressing Lower body dressing/undressing steps patient completed: Thread/unthread right underwear leg;Thread/unthread left underwear leg;Thread/unthread right pants leg;Thread/unthread left pants leg;Pull pants up/down;Fasten/unfasten pants Lower body dressing/undressing: 3: Mod-Patient completed 50-74% of tasks  FIM - Toileting Toileting steps completed by patient: Performs perineal hygiene;Adjust clothing prior to toileting Toileting Assistive Devices: Grab bar or rail for support Toileting: 4: Steadying assist  FIM - Diplomatic Services operational officer Devices: Grab bars Toilet Transfers: 4-To toilet/BSC: Min A (steadying Pt. > 75%);4-From toilet/BSC: Min A (steadying Pt. > 75%)  FIM - Banker Devices: Walker;Bed rails;Arm rests Bed/Chair Transfer: 5: Sit > Supine: Supervision (verbal cues/safety issues);4: Bed > Chair  or W/C: Min A (steadying Pt. >  75%);4: Chair or W/C > Bed: Min A (steadying Pt. > 75%)  FIM - Locomotion: Wheelchair Locomotion: Wheelchair: 0: Activity did not occur FIM - Locomotion: Ambulation Locomotion: Ambulation Assistive Devices: Designer, industrial/product Ambulation/Gait Assistance: 4: Min guard Locomotion: Ambulation: 2: Travels 50 - 149 ft with minimal assistance (Pt.>75%)  Comprehension Comprehension Mode: Auditory Comprehension: 5-Follows basic conversation/direction: With extra time/assistive device  Expression Expression Mode: Verbal Expression: 5-Expresses basic needs/ideas: With no assist  Social Interaction Social Interaction: 4-Interacts appropriately 75 - 89% of the time - Needs redirection for appropriate language or to initiate interaction.  Problem Solving Problem Solving: 5-Solves basic problems: With no assist  Memory Memory: 5-Recognizes or recalls 90% of the time/requires cueing < 10% of the time   Medical Problem List and Plan:  1. Thrombotic right basal ganglia infarction  2. DVT Prophylaxis/Anticoagulation: SCDs. Follow clinically for now. 3. Pain Management: Tylenol as needed  4. Neuropsych: This patient is capable of making decisions on her own behalf.  5. Hypertension.Off Lisinopril 10 mg daily. Systolic BP elevated this am 6. Tobacco abuse. NicoDerm patch. Provide counseling for this as well as secondary stroke prevention  7. Gram-negative rod UTI. Bactrim 09/28/2013--7 day course. Afebrile off abx Denies any dysuria or hematuria, this may have contributed to ARF  8. Hyperlipidemia. Lipitor  9. FEN: encourage PO intake.   -will ask RD to assist with PO intake 10.  ARF secondary to ACE-I NS bolus, D/C ACE, kayexalate 11.  Neutropenia, likely Bactrim no completed ANC~1.2 monitor   LOS (Days) 4 A FACE TO FACE EVALUATION WAS PERFORMED  Erick Colace 10/07/2013 6:35 AM

## 2013-10-08 ENCOUNTER — Inpatient Hospital Stay (HOSPITAL_COMMUNITY): Payer: BC Managed Care – PPO

## 2013-10-08 ENCOUNTER — Inpatient Hospital Stay (HOSPITAL_COMMUNITY): Payer: BC Managed Care – PPO | Admitting: Occupational Therapy

## 2013-10-08 NOTE — Progress Notes (Addendum)
Physical Therapy Note  Patient Details  Name: Erika Wang MRN: [id number] Date of Birth: 1951-09-22 Today's Date: 10/08/2013  No pain reported AM or PM  0848-0930, 42 min 1300-1345. 45 , min Individual therapy  Individual therapy  LTGs have been downgraded to supervision overall due to pt's decreased awareness and safety.  Tx 1: t asleep in bed, buty easily aroused.  Tx focused on neuromuscular re-education via demo, manual and tactile cues, forced use for LLE stance stability and LUE functional movement and motor control.  Gait in room to/from toilet with RW, LOB to L as she turned L, mod assist to prevent fall. Gait in controlled env. On level tile without AD, x 200' including turns to L, with min guard assist, with several mild LOB to R or L, recovered independently.  Gait up/down 5 steps with 2 rails, min guard assist, VCs for L hand safety/position.  Toileting with supervision.  W/c propulsion using bil UEs with mod assist due to poor initiation of LUE, with bil LEs x 150' with supervision, VCs for steering.  NuStep at level 5, x 5 min rated 12 on Borg scale, with good automatic function of L hand gripping handle throughout. X 2 minutes with intermittent rest, using L hand only, level 4, for forced LUE use.   Tx 2:  Simulated car transfer to jeep height seat, with min guard assist.  Patient demonstrates increased fall risk as noted by score of  32 /56 on Berg Balance Scale.  (<36= high risk for falls, close to 100%; 37-45 significant >80%; 46-51 moderate >50%; 52-55 lower >25%).  Discussed pt's score and implications regarding balance and falls, but she still insists she wants to live alone and go back to work soon.  Gait without AD x 100', min assist.  [name] 10/08/2013, 1:43 PM

## 2013-10-08 NOTE — Progress Notes (Signed)
Social Work Patient ID: Erika Wang, female   DOB: [birth year], 62 y.o.   MRN: [id number] Met with pt to inform of team conference goals -supervision level and discharge 12/17.  Pt worried about her cat but daughter feeding it, gone too long according to her. Did not acknowledge the supervison level recommendation.  She plans to return home alone with friend to check on her.  She feels it is fine.  This worker does not feel she has awareness of Her deficits and safety issues.  Informed her our recomendation is home with her daughter, someone there.  Pt main focus is to go home.  Will work on this and discuss with daughter.

## 2013-10-08 NOTE — Progress Notes (Signed)
Speech Language Pathology Daily Session Note  Patient Details  Name: Erika Wang MRN: [id number] Date of Birth: 04-11-1951  Today's Date: 10/08/2013 Time: 1415-1500 Time Calculation (min): 45 min  Short Term Goals: Week 1: SLP Short Term Goal 1 (Week 1): Pt will demonstrate adequate mastication and oral clearance with regular textures with Min cues to assess readiness for diet advancement, SLP Short Term Goal 2 (Week 1): Pt will utilize external memory aids to recall new information with Supervision SLP Short Term Goal 3 (Week 1): Pt will demonstrate adequate medication management with use of external aids with Supervision SLP Short Term Goal 4 (Week 1): Pt will demonstrate emergent awareness of physical and cognitive impairments with Supervision SLP Short Term Goal 5 (Week 1): Pt will demonstrate adequate problem solving with mildly complex tasks with Supervision  Skilled Therapeutic Interventions: Treatment focused on cognitive goals. SLP facilitated session with generation of external aid for recall of current medications. Pt initially reluctant to practicing with a pill box, but agreebale with encouragement. Pt completed pill box task with Min cues for accuracy. Pt demonstrated intellectual awareness of physical impairment with Mod I, however required Min cues to identify cognitive impairments. Continue plan of care.   FIM:  Comprehension Comprehension Mode: Auditory Comprehension: 5-Follows basic conversation/direction: With no assist Expression Expression Mode: Verbal Expression: 5-Expresses complex 90% of the time/cues < 10% of the time Social Interaction Social Interaction: 4-Interacts appropriately 75 - 89% of the time - Needs redirection for appropriate language or to initiate interaction. Problem Solving Problem Solving: 5-Solves basic problems: With no assist Memory Memory: 3-Recognizes or recalls 50 - 74% of the time/requires cueing 25 - 49% of the time  Pain Pain  Assessment Pain Assessment: No/denies pain  Therapy/Group: Individual Therapy   Maxcine Ham, M.A. CCC-SLP [phone]   Maxcine Ham 10/08/2013, 4:59 PM

## 2013-10-08 NOTE — Progress Notes (Signed)
Subjective/Complaints: Oral meals 15-75% No pains  No dizziness A 12 point review of systems has been performed and if not noted above is otherwise negative.   Objective: Vital Signs: Blood pressure 179/79, pulse 69, temperature 98.2 F (36.8 C), temperature source Oral, resp. rate 18, height 5' (1.524 m), weight 50.4 kg (111 lb 1.8 oz), SpO2 98.00%. No results found.  Recent Labs  10/06/13 0555  WBC 2.9*  HGB 14.2  HCT 41.4  PLT 228    Recent Labs  10/06/13 1650 10/07/13 0625  NA 131* 134*  K 6.8* 5.3*  CL 97 99  GLUCOSE 97 99  BUN 37* 28*  CREATININE 2.09* 1.37*  CALCIUM 9.9 9.2   CBG (last 3)  No results found for this basename: GLUCAP,  in the last 72 hours  Wt Readings from Last 3 Encounters:  10/03/13 50.4 kg (111 lb 1.8 oz)  10/01/13 54.885 kg (121 lb)    Physical Exam:  Constitutional: She is oriented to person, place, and time. She is frail, side lying in bed HENT:  Head: Normocephalic.  Poor dentition, mucosa moist  Eyes: EOM are normal.  Neck: Normal range of motion. Neck supple. No thyromegaly present.  Cardiovascular: Normal rate and regular rhythm. No murmur  Respiratory: Effort normal and breath sounds normal. No respiratory distress. No wheezes or rales  GI: Soft. Bowel sounds are normal. She exhibits no distension. No pain Neurological: She is alert and oriented to person, place, and time. HOH Follows commands. Fair insight to her deficits. Left facial weakness and tongue deviation. Speech still slurred but intelligible. LUE is 4/5 deltoid, 4 at bicep and tricep, 4 HI. LLE is 4 HF and 4 KE, Ankle 4/5. No  sensory deficits.  Skin: Skin is warm and dry.  Psychiatric: She has a normal mood and affect. Her behavior is normal. Judgment and thought content normal   Assessment/Plan: 1. Functional deficits secondary to thrombotic right basal ganglia infarct which require 3+ hours per day of interdisciplinary therapy in a comprehensive inpatient  rehab setting. Physiatrist is providing close team supervision and 24 hour management of active medical problems listed below. Physiatrist and rehab team continue to assess barriers to discharge/monitor patient progress toward functional and medical goals. Team conference today please see physician documentation under team conference tab, met with team face-to-face to discuss problems,progress, and goals. Formulized individual treatment plan based on medical history, underlying problem and comorbidities. FIM: FIM - Bathing Bathing Steps Patient Completed: Chest;Right Arm;Left Arm;Abdomen;Left upper leg;Right upper leg;Buttocks;Right lower leg (including foot);Left lower leg (including foot);Front perineal area Bathing: 4: Steadying assist  FIM - Upper Body Dressing/Undressing Upper body dressing/undressing steps patient completed: Thread/unthread right bra strap;Hook/unhook bra;Thread/unthread right sleeve of pullover shirt/dresss;Thread/unthread left sleeve of pullover shirt/dress;Put head through opening of pull over shirt/dress;Pull shirt over trunk Upper body dressing/undressing: 0: Activity did not occur (pt bathed under her clothing as she had IV in) FIM - Lower Body Dressing/Undressing Lower body dressing/undressing steps patient completed: Thread/unthread right underwear leg;Thread/unthread left underwear leg;Thread/unthread right pants leg;Thread/unthread left pants leg;Pull pants up/down;Fasten/unfasten pants;Pull underwear up/down;Don/Doff right sock;Don/Doff left sock Lower body dressing/undressing: 4: Steadying Assist  FIM - Toileting Toileting steps completed by patient: Adjust clothing prior to toileting;Performs perineal hygiene;Adjust clothing after toileting Toileting Assistive Devices: Grab bar or rail for support Toileting: 5: Supervision: Safety issues/verbal cues  FIM - Diplomatic Services operational officer Devices: Grab bars Toilet Transfers: 5-To toilet/BSC:  Supervision (verbal cues/safety issues);5-From toilet/BSC: Supervision (verbal cues/safety issues)  FIM -  Bed/Chair Transport planner Devices: Arm rests Bed/Chair Transfer: 5: Supine > Sit: Supervision (verbal cues/safety issues);5: Sit > Supine: Supervision (verbal cues/safety issues);4: Bed > Chair or W/C: Min A (steadying Pt. > 75%);4: Chair or W/C > Bed: Min A (steadying Pt. > 75%)  FIM - Locomotion: Wheelchair Distance: 100 Locomotion: Wheelchair: 0: Activity did not occur FIM - Locomotion: Ambulation Locomotion: Ambulation Assistive Devices: Designer, industrial/product Ambulation/Gait Assistance: 4: Min assist Locomotion: Ambulation: 1: Travels less than 50 ft with minimal assistance (Pt.>75%)  Comprehension Comprehension Mode: Auditory Comprehension: 5-Follows basic conversation/direction: With no assist  Expression Expression Mode: Verbal Expression: 5-Expresses complex 90% of the time/cues < 10% of the time  Social Interaction Social Interaction: 4-Interacts appropriately 75 - 89% of the time - Needs redirection for appropriate language or to initiate interaction.  Problem Solving Problem Solving: 5-Solves basic problems: With no assist  Memory Memory: 4-Recognizes or recalls 75 - 89% of the time/requires cueing 10 - 24% of the time   Medical Problem List and Plan:  1. Thrombotic right basal ganglia infarction  2. DVT Prophylaxis/Anticoagulation: SCDs. Follow clinically for now. 3. Pain Management: Tylenol as needed  4. Neuropsych: This patient is capable of making decisions on her own behalf.  5. Hypertension.Off Lisinopril 10 mg daily. Systolic BP elevated this am 6. Tobacco abuse. NicoDerm patch. Provide counseling for this as well as secondary stroke prevention  7. Gram-negative rod UTI. Bactrim 09/28/2013--7 day course. Afebrile off abx Denies any dysuria or hematuria, this may have contributed to ARF  8. Hyperlipidemia. Lipitor  9. FEN: encourage PO  intake.   -will ask RD to assist with PO intake 10.  ARF secondary to ACE-I resolved  11.  Neutropenia, likely Bactrim   ANC~1.2 monitor, expect improvement   LOS (Days) 5 A FACE TO FACE EVALUATION WAS PERFORMED  Jarron Curley E 10/08/2013 7:11 AM

## 2013-10-08 NOTE — Patient Care Conference (Signed)
Inpatient RehabilitationTeam Conference and Plan of Care Update Date: 10/08/2013   Time: 11:10 AM    Patient Name: Erika Wang      Medical Record Number: [id number]  Date of Birth: 1951-06-27 Sex: Female         Room/Bed: 4M03C/4M03C-01 Payor Info: Payor: BLUE [employer]OSS BLUE [employer]IELD / Plan: BCBS PPO OUT OF STATE / Product Type: *No Product type* /    Admitting Diagnosis: BG-CVA  Admit Date/Time:  10/03/2013  3:35 PM Admission Comments: No comment available   Primary Diagnosis:  <principal problem not specified> Principal Problem: <principal problem not specified>  Patient Active Problem List   Diagnosis Date Noted  . Acute renal failure 10/07/2013  . CVA (cerebral infarction) 10/03/2013  . Infection of urinary tract 10/02/2013  . Cigar smoker motivated to quit 10/02/2013  . 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 Left hemiparesis 10/02/2013  . Encounter for long-term (current) use of medications 10/02/2013  . Stroke 09/28/2013    Expected Discharge Date: Expected Discharge Date: 10/15/13  Team Members Present: Physician leading conference: Dr. Claudette Laws Social Worker Present: Dossie Der, LCSW Nurse Present: Gregor Hams, RN PT Present: Wanda Plump, PT OT Present: Scherrie November, Felipa Eth, OT SLP Present: Maxcine Ham, SLP PPS Coordinator present : Tora Duck, RN, CRRN     Current Status/Progress Goal Weekly Team Focus  Medical   orthostatic hypotension  maintain medical stability  resume antihypertensive meds, monitor for renal failure   Bowel/Bladder   Remains continent of B/B.    Will remain continent of B/B function during stay.   Monitor for changes in B/B q shift and PRN   Swallow/Nutrition/ Hydration   Mod I with regular diet and thin liquids  Mod I with least restrictive PO  goals met   ADL's   min assist overall for balance  mod I   ADL retraining with a focus on left side awareness, left side coordination, balance, postural control,  pt education   Mobility   S bed mobility, Min A overall for trasfers and gait with/without deivce x150'   Mod I for all  Increased awareness of deficits, safety, balance, coordination and functional R-sided strength, gait and stairs   Communication   Able to voice all needs appropriately.   Will remain alert and oriented during stay and able to voice needs.  Cognition checks q shift   Safety/Cognition/ Behavioral Observations  supervision-Min for recall, emergent awareness, complex problem solving  Mod I  recall, emergent awareness, complex problem solving   Pain   No complaints of pain  Patient will remain with pain less than 3 on 1/10 scale during stay.  Assess for pain q shift and PRN   Skin   No skin issues  Patient will remain free of breakdown during stay  Skin checks q shift and PRN      *See Care Plan and progress notes for long and short-term goals.  Barriers to Discharge: see above    Possible Resolutions to Barriers:  see above    Discharge Planning/Teaching Needs:  Pt plans to go back to her home, but has the option to go to daughter's home.  At her home intermittent assist      Team Discussion:  Poor safety awareness-recommendation is supervision level.  BP elevated-MD addressing. Renal function better.   Revisions to Treatment Plan:  Downgraded goals to supervision level due to safety issues   Continued Need for Acute Rehabilitation Level of Care: The patient requires daily medical management  by a physician with specialized training in physical medicine and rehabilitation for the following conditions: Daily direction of a multidisciplinary physical rehabilitation program to ensure safe treatment while eliciting the highest outcome that is of practical value to the patient.: Yes Daily medical management of patient stability for increased activity during participation in an intensive rehabilitation regime.: Yes Daily analysis of laboratory values and/or radiology  reports with any subsequent need for medication adjustment of medical intervention for : Neurological problems;Other  Erika Wang, Lemar Livings 10/08/2013, 1:44 PM

## 2013-10-08 NOTE — Progress Notes (Signed)
Occupational Therapy Session Note  Patient Details  Name: Erika Wang MRN: [id number] Date of Birth: May 30, 1951  Today's Date: 10/08/2013 Time: 1005-1100 Time Calculation (min): 55 min  Short Term Goals: Week 1:  OT Short Term Goal 1 (Week 1): Pt will complete toilet transfer with supervision and min verbal cues OT Short Term Goal 2 (Week 1): Pt will complet bathing with supervision and min verbal cues for safety OT Short Term Goal 3 (Week 1): Pt will complete home management task with supervision and min verbal cues for safety OT Short Term Goal 4 (Week 1): Pt will attend to L side during therapy session with min cues  Skilled Therapeutic Interventions/Progress Updates:      Pt seen for BADL retraining of toileting and LB bathing with a focus on activity tolerance, left side awareness, and balance.  Pt did not have clean clothing with her to put on today.  She declined taking a full bath, but she did toilet and then stand at sink to wash LB with close supervision.  She also stood to complete grooming at sink with supervision. Pt was then seen for LUE coordination in the gym. Pt began with bilateral coordination training using [employer] with frequent cues initially to maintain grasp on handle. She then worked on bilateral coordination of holding a medium sized ball in B hands to try to achieve symmetrical movements. Pt needed cues to fully extend L elbow. She then worked on [facility] with opening pill bottles, taking small beads out of the bottles and replacing them, and lacing small wooden beads on string without difficulty.  Pt stated she makes jewelry as a hobby.  To initiate pre-vocational skills, pt worked on picking up a large plastic box from the table to the floor 5 x with min assist to steady balance and cues to stand in a wide BOS. Pt stated that she has to lift heavy boxes at work.  Pt returned to room at end of session with call light and phone in reach.  Therapy Documentation Precautions:   Precautions Precautions: Fall Precaution Comments: L hemiparesis Restrictions Weight Bearing Restrictions: No    Vital Signs: Therapy Vitals Pulse Rate: 92 BP: 129/85 mmHg Patient Position, if appropriate: Standing Pain: Pain Assessment Pain Assessment: 0-10 Pain Score: 0-No pain Patients Stated Pain Goal: 3 Multiple Pain Sites: No ADL:  See FIM for current functional status  Therapy/Group: Individual Therapy  SAGUIER,JULIA 10/08/2013, 11:39 AM

## 2013-10-09 ENCOUNTER — Inpatient Hospital Stay (HOSPITAL_COMMUNITY): Payer: BC Managed Care – PPO | Admitting: Occupational Therapy

## 2013-10-09 ENCOUNTER — Inpatient Hospital Stay (HOSPITAL_COMMUNITY): Payer: BC Managed Care – PPO

## 2013-10-09 DIAGNOSIS — N179 Acute kidney failure, unspecified: Secondary | ICD-10-CM

## 2013-10-09 DIAGNOSIS — G8190 Hemiplegia, unspecified affecting unspecified side: Secondary | ICD-10-CM

## 2013-10-09 DIAGNOSIS — I6350 Cerebral infarction due to unspecified occlusion or stenosis of unspecified cerebral artery: Secondary | ICD-10-CM

## 2013-10-09 LAB — CBC WITH DIFFERENTIAL/PLATELET
Basophils Relative: 1 % (ref 0–1)
Eosinophils Relative: 2 % (ref 0–5)
HCT: 39 % (ref 36.0–46.0)
Hemoglobin: 12.8 g/dL (ref 12.0–15.0)
MCH: 29.1 pg (ref 26.0–34.0)
MCHC: 32.8 g/dL (ref 30.0–36.0)
MCV: 88.6 fL (ref 78.0–100.0)
Monocytes Absolute: 0.5 10*3/uL (ref 0.1–1.0)
Monocytes Relative: 13 % — ABNORMAL HIGH (ref 3–12)
Neutro Abs: 1.1 10*3/uL — ABNORMAL LOW (ref 1.7–7.7)
RDW: 14.3 % (ref 11.5–15.5)

## 2013-10-09 NOTE — Progress Notes (Signed)
Occupational Therapy Weekly Progress Note  Patient Details  Name: Erika Wang MRN: [id number] Date of Birth: 1951-09-23  Today's Date: 10/09/2013 Time: 1005-1105 Time Calculation (min): 60 min  Patient has met 4 of 4 short term goals.  Pt is progressing well and is now Supervision with her basic self care, but continues to demonstrate decreased safety awareness. Pt has also improved well with her [facility] as she can tie her shoes and open containers.  Patient continues to demonstrate the following deficits: decreased left side awareness, decreased balance, and decreased GMC of LUE and therefore will continue to benefit from skilled OT intervention to enhance overall performance with BADL and iADL.  Patient not progressing toward long term goals.  See goal revision..  Plan of care revisions: Goals changed from mod I to Supervision based on cognitive and balance deficits..  OT Short Term Goals Week 1:  OT Short Term Goal 1 (Week 1): Pt will complete toilet transfer with supervision and min verbal cues OT Short Term Goal 1 - Progress (Week 1): Met OT Short Term Goal 2 (Week 1): Pt will complet bathing with supervision and min verbal cues for safety OT Short Term Goal 2 - Progress (Week 1): Met OT Short Term Goal 3 (Week 1): Pt will complete home management task with supervision and min verbal cues for safety OT Short Term Goal 3 - Progress (Week 1): Met OT Short Term Goal 4 (Week 1): Pt will attend to L side during therapy session with min cues OT Short Term Goal 4 - Progress (Week 1): Met Week 2:  OT Short Term Goal 1 (Week 2): STGs = LTGs due to discharge next week LTGs modified to Supervision based on safety awareness concerns and limited dynamic balance.  Skilled Therapeutic Interventions/Progress Updates:    Pt seen for BADL retraining with a focus on safety awareness and balance. Pt was encouraged to move around and gather supplies as if "she was alone in the room" to prepare her for her  discharge plans of living alone.  It has been strongly emphasized to patient that it is recommended she have supervision, but pt is determined to go home alone. She was able to complete self care with Supervision, but did need cues to stand with a wide BOS.  Pt then ambulated to ADL apartment to work on tub transfers with bench with supervision. Pt liked the bench and was agreeable to using one at home. She then ambulated to kitchen to work on retrieving items from cupboards with cues to move closer to cabinet to not over reach. Pt returned to room and sat in recliner. QRB applied and call light in place.  Therapy Documentation Precautions:  Precautions Precautions: Fall Precaution Comments: L hemiparesis Restrictions Weight Bearing Restrictions: No   Pain: Pain Assessment Pain Assessment: No/denies pain ADL:  See FIM for current functional status  Therapy/Group: Individual Therapy  Erika Wang 10/09/2013, 12:29 PM

## 2013-10-09 NOTE — Progress Notes (Signed)
Physical Therapy Note  Patient Details  Name: Erika Wang MRN: [id number] Date of Birth: 11-13-1950 Today's Date: 10/09/2013  Patient has met 1 of 11 long term goals. Short term goals not set due to estimated length of stay. D/c set for 10/15/13.  Patient continues to demonstrate the following deficits:balance, activity tolerance, awareness, strength, attention, memory and therefore will continue to benefit from skilled PT intervention to enhance overall performance with activity tolerance, balance, postural control, ability to compensate for deficits, functional use of left upper extremity and left lower extremity, attention, awareness and coordination.   See Patient's Care Plan for progression toward long term goals. Patient progressing toward long term goals.. Continue plan of care. Due to pt's poor insight and safety, LTGs were downgraded to supervision. Pt insists she will go home alone to her home, and demonstrates poor insight and safety, and memory.  She discussed her need to get on the floor to light her gas heater at her home, for example.   This therapist's recommendation is supervision 24/7; safety concerns conveyed to MSW.  .   Skilled Therapeutic Interventions/Progress Updates:  tx today: 1405-1505, 60 min individual therapy   Tx focused on LUE , L trunk and LLE neuromuscular re-education via forced use demo, tactile and manual cues during   dynamic sitting : reaching out of BOS with L or R hand, L hand gross assist and bil coordination activities, pelvic dissociation during scooting and w/c propulsion x 25' using bil UEs (Theratubing now on L rim) mod assist due to continued poor L hand initiation during this activity   Dynamic standing activities: gait without AD x 150' with min assist/supervision, retrieving items from floor min guard without LOB ; step/taps onto 5" high stool with 12/12 LOB backwards/L when descending with L foot, gait without AD in home setting, toilet transfer  with supervision, wall bars..   Basic transfer with supervision, cues for reaching back, turning.  Pt exhausted; requested return to bed; bed mobility with supervision.  Pt made comfortable and all needs left within reach. Bed alarm set.  Sherita Decoste 10/09/2013, 8:00 AM

## 2013-10-09 NOTE — Progress Notes (Signed)
Speech Language Pathology Weekly Progress & Daily Session Notes  Patient Details  Name: Erika Wang MRN: [id number] Date of Birth: 1951/03/28  Today's Date: 10/09/2013 Time: 0915-1000 Time Calculation (min): 45 min  Short Term Goals: Week 1: SLP Short Term Goal 1 (Week 1): Pt will demonstrate adequate mastication and oral clearance with regular textures with Min cues to assess readiness for diet advancement, SLP Short Term Goal 1 - Progress (Week 1): Met SLP Short Term Goal 2 (Week 1): Pt will utilize external memory aids to recall new information with Supervision SLP Short Term Goal 2 - Progress (Week 1): Met SLP Short Term Goal 3 (Week 1): Pt will demonstrate adequate medication management with use of external aids with Supervision SLP Short Term Goal 3 - Progress (Week 1): Not met SLP Short Term Goal 4 (Week 1): Pt will demonstrate emergent awareness of physical and cognitive impairments with Supervision SLP Short Term Goal 4 - Progress (Week 1): Not met SLP Short Term Goal 5 (Week 1): Pt will demonstrate adequate problem solving with mildly complex tasks with Supervision SLP Short Term Goal 5 - Progress (Week 1): Not met  New Short Term Goals: Week 2: SLP Short Term Goal 1 (Week 2): Pt will utilize external memory aids to recall new information with Mod I SLP Short Term Goal 2 (Week 2): Pt will demonstrate adequate medication management with use of external aids with Supervision SLP Short Term Goal 3 (Week 2): Pt will demonstrate intellectual awareness of physical and cognitive impairments with Supervision SLP Short Term Goal 4 (Week 2): Pt will demonstrate adequate problem solving with mildly complex tasks with Supervision  Weekly Progress Updates: Pt has met 2 out of 5 STGs during this reporting period. She was advanced to regular textures with thin liquids, which she is consuming with Mod I. SLP signed off for dysphagia treatment. Pt requires supervision level cueing for  utilization of external memory aids and Min cues for intellectual awareness of cognitive impairments, medication management, and mildly complex problem solving. Given the above as well as decreased safety awareness, LTGs have been adjusted to supervision level. Education ongoing. Pt will benefit from continued SLP services to maximize functional independence prior to discharge.   SLP Intensity: Minumum of 1-2 x/day, 30 to 90 minutes SLP Frequency: 5 out of 7 days SLP Duration/Estimated Length of Stay: 13-17 days SLP Treatment/Interventions: Cognitive remediation/compensation;Cueing hierarchy;Environmental controls;Functional tasks;Internal/external aids;Medication managment;Patient/family education  Daily Session Skilled Therapeutic Intervention: Skilled treatment focused on cognitive goals. SLP facilitated session with Supervision level cues for utilization of schedule for recall of daily events. Pt required Min question cues for intellectual awareness of cognitive changes. Pt participated in medication management task, filling out pill box with use of external aid with Min cues for recall and problem solving. Upon completion of session, pt exhibited decreased safety awareness by stating that she was going to go to the bathroom after SLP left, when she was in her chair with QRB in place. SLP reminded pt of safety precautions and assisted pt to the bathroom (continent of bowel and bladder). Pt was returned to her chair with QRB in place and call bell within reach.  FIM:  Comprehension Comprehension Mode: Auditory Comprehension: 5-Follows basic conversation/direction: With extra time/assistive device Expression Expression Mode: Verbal Expression: 5-Expresses basic needs/ideas: With no assist Social Interaction Social Interaction: 5-Interacts appropriately 90% of the time - Needs monitoring or encouragement for participation or interaction. Problem Solving Problem Solving: 5-Solves basic problems:  With no assist Memory Memory:  5-Recognizes or recalls 90% of the time/requires cueing < 10% of the time General    Pain Pain Assessment Pain Assessment: No/denies pain  Therapy/Group: Individual Therapy   Maxcine Ham, M.A. CCC-SLP [phone]   Maxcine Ham 10/09/2013, 12:18 PM

## 2013-10-09 NOTE — Progress Notes (Signed)
Subjective/Complaints: Oral meals improved to 50-100% yesterday No pains  No dizziness Discussed D/C date with pt, in agreement A 12 point review of systems has been performed and if not noted above is otherwise negative.   Objective: Vital Signs: Blood pressure 147/85, pulse 63, temperature 98.1 F (36.7 C), temperature source Oral, resp. rate 17, height 5' (1.524 m), weight 50.939 kg (112 lb 4.8 oz), SpO2 100.00%. No results found. No results found for this basename: WBC, HGB, HCT, PLT,  in the last 72 hours  Recent Labs  10/06/13 1650 10/07/13 0625  NA 131* 134*  K 6.8* 5.3*  CL 97 99  GLUCOSE 97 99  BUN 37* 28*  CREATININE 2.09* 1.37*  CALCIUM 9.9 9.2   CBG (last 3)  No results found for this basename: GLUCAP,  in the last 72 hours  Wt Readings from Last 3 Encounters:  10/08/13 50.939 kg (112 lb 4.8 oz)  10/01/13 54.885 kg (121 lb)    Physical Exam:  Constitutional: She is oriented to person, place, and time. She is frail, side lying in bed HENT:  Head: Normocephalic.  Poor dentition, mucosa moist  Eyes: EOM are normal.  Neck: Normal range of motion. Neck supple. No thyromegaly present.  Cardiovascular: Normal rate and regular rhythm. No murmur  Respiratory: Effort normal and breath sounds normal. No respiratory distress. No wheezes or rales  GI: Soft. Bowel sounds are normal. She exhibits no distension. No pain Neurological: She is alert and oriented to person, place, and time. HOH Follows commands. Fair insight to her deficits. Left facial weakness and tongue deviation. Speech still slurred but intelligible. LUE is 4/5 deltoid, 4 at bicep and tricep, 4 HI. LLE is 4 HF and 4 KE, Ankle 4/5. No  sensory deficits.  Skin: Skin is warm and dry.  Psychiatric: She has a normal mood and affect. Her behavior is normal. Judgment and thought content normal   Assessment/Plan: 1. Functional deficits secondary to thrombotic right basal ganglia infarct which require 3+  hours per day of interdisciplinary therapy in a comprehensive inpatient rehab setting. Physiatrist is providing close team supervision and 24 hour management of active medical problems listed below. Physiatrist and rehab team continue to assess barriers to discharge/monitor patient progress toward functional and medical goals.  FIM: FIM - Bathing Bathing Steps Patient Completed: Chest;Right Arm;Left Arm;Abdomen;Left upper leg;Right upper leg;Buttocks;Right lower leg (including foot);Left lower leg (including foot);Front perineal area Bathing: 4: Steadying assist  FIM - Upper Body Dressing/Undressing Upper body dressing/undressing steps patient completed: Thread/unthread right bra strap;Hook/unhook bra;Thread/unthread right sleeve of pullover shirt/dresss;Thread/unthread left sleeve of pullover shirt/dress;Put head through opening of pull over shirt/dress;Pull shirt over trunk Upper body dressing/undressing: 0: Activity did not occur (pt bathed under her clothing as she had IV in) FIM - Lower Body Dressing/Undressing Lower body dressing/undressing steps patient completed: Thread/unthread right underwear leg;Thread/unthread left underwear leg;Thread/unthread right pants leg;Thread/unthread left pants leg;Pull pants up/down;Fasten/unfasten pants;Pull underwear up/down;Don/Doff right sock;Don/Doff left sock Lower body dressing/undressing: 4: Steadying Assist  FIM - Toileting Toileting steps completed by patient: Performs perineal hygiene Toileting Assistive Devices: Grab bar or rail for support Toileting: 5: Supervision: Safety issues/verbal cues  FIM - Diplomatic Services operational officer Devices: Grab bars Toilet Transfers: 4-To toilet/BSC: Min A (steadying Pt. > 75%);4-From toilet/BSC: Min A (steadying Pt. > 75%)  FIM - Bed/Chair Transfer Bed/Chair Transfer Assistive Devices: Therapist, occupational: 4: Supine > Sit: Min A (steadying Pt. > 75%/lift 1 leg);4: Sit > Supine: Min A  (steadying  pt. > 75%/lift 1 leg);4: Bed > Chair or W/C: Min A (steadying Pt. > 75%);4: Chair or W/C > Bed: Min A (steadying Pt. > 75%)  FIM - Locomotion: Wheelchair Distance: 150 Locomotion: Wheelchair: 0: Activity did not occur FIM - Locomotion: Ambulation Locomotion: Ambulation Assistive Devices: Designer, industrial/product Ambulation/Gait Assistance: 4: Min assist Locomotion: Ambulation: 2: Travels 50 - 149 ft with minimal assistance (Pt.>75%)  Comprehension Comprehension Mode: Auditory Comprehension: 5-Follows basic conversation/direction: With extra time/assistive device  Expression Expression Mode: Verbal Expression: 5-Expresses basic needs/ideas: With no assist  Social Interaction Social Interaction: 4-Interacts appropriately 75 - 89% of the time - Needs redirection for appropriate language or to initiate interaction.  Problem Solving Problem Solving: 5-Solves complex 90% of the time/cues < 10% of the time  Memory Memory: 5-Recognizes or recalls 90% of the time/requires cueing < 10% of the time   Medical Problem List and Plan:  1. Thrombotic right basal ganglia infarction  2. DVT Prophylaxis/Anticoagulation: SCDs. Follow clinically for now. 3. Pain Management: Tylenol as needed  4. Neuropsych: This patient is capable of making decisions on her own behalf.  5. Hypertension.Off Lisinopril 10 mg daily. Systolic BP elevated this am 6. Tobacco abuse. NicoDerm patch. Provide counseling for this as well as secondary stroke prevention  7. Gram-negative rod UTI. Bactrim 09/28/2013--7 day course. Afebrile off abx Denies any dysuria or hematuria, this may have contributed to ARF  8. Hyperlipidemia. Lipitor  9. FEN: encourage PO intake.   -improving 10.  ARF secondary to ACE-I resolved  11.  Neutropenia, likely Bactrim   ANC~1.2 monitor, expect improvement   LOS (Days) 6 A FACE TO FACE EVALUATION WAS PERFORMED  Erick Colace 10/09/2013 6:52 AM

## 2013-10-10 ENCOUNTER — Inpatient Hospital Stay (HOSPITAL_COMMUNITY): Payer: BC Managed Care – PPO | Admitting: Occupational Therapy

## 2013-10-10 ENCOUNTER — Inpatient Hospital Stay (HOSPITAL_COMMUNITY): Payer: BC Managed Care – PPO | Admitting: Speech Pathology

## 2013-10-10 ENCOUNTER — Inpatient Hospital Stay (HOSPITAL_COMMUNITY): Payer: BC Managed Care – PPO | Admitting: *Deleted

## 2013-10-10 NOTE — Progress Notes (Signed)
Subjective/Complaints:  A 12 point review of systems has been performed and if not noted above is otherwise negative.   Objective: Vital Signs: Blood pressure 140/86, pulse 83, temperature 97.7 F (36.5 C), temperature source Oral, resp. rate 16, height 5' (1.524 m), weight 50.939 kg (112 lb 4.8 oz), SpO2 97.00%. No results found.  Recent Labs  10/09/13 0740  WBC 3.5*  HGB 12.8  HCT 39.0  PLT 199   No results found for this basename: NA, K, CL, CO, GLUCOSE, BUN, CREATININE, CALCIUM,  in the last 72 hours CBG (last 3)  No results found for this basename: GLUCAP,  in the last 72 hours  Wt Readings from Last 3 Encounters:  10/08/13 50.939 kg (112 lb 4.8 oz)  10/01/13 54.885 kg (121 lb)    Physical Exam:  Constitutional: She is oriented to person, place, and time. She is frail, side lying in bed HENT:  Head: Normocephalic.  Poor dentition, mucosa moist  Eyes: EOM are normal.  Neck: Normal range of motion. Neck supple. No thyromegaly present.  Cardiovascular: Normal rate and regular rhythm. No murmur  Respiratory: Effort normal and breath sounds normal. No respiratory distress. No wheezes or rales  GI: Soft. Bowel sounds are normal. She exhibits no distension. No pain Neurological: She is alert and oriented to person, place, and time. HOH Follows commands. . Left facial weakness and tongue deviation. Speech  intelligible. LUE is 4/5 deltoid, 4 at bicep and tricep, 4 HI. LLE is 4 HF and 4 KE, Ankle 4/5. No  sensory deficits.  Skin: Skin is warm and dry. Ecchymosis L arm and around IV site left forearm Psychiatric: She has a normal mood and affect. Her behavior is normal. Judgment and thought content normal   Assessment/Plan: 1. Functional deficits secondary to thrombotic right basal ganglia infarct which require 3+ hours per day of interdisciplinary therapy in a comprehensive inpatient rehab setting. Physiatrist is providing close team supervision and 24 hour management of  active medical problems listed below. Physiatrist and rehab team continue to assess barriers to discharge/monitor patient progress toward functional and medical goals.  FIM: FIM - Bathing Bathing Steps Patient Completed: Chest;Right Arm;Left Arm;Abdomen;Left upper leg;Right upper leg;Buttocks;Right lower leg (including foot);Left lower leg (including foot);Front perineal area Bathing: 5: Supervision: Safety issues/verbal cues  FIM - Upper Body Dressing/Undressing Upper body dressing/undressing steps patient completed: Thread/unthread right bra strap;Hook/unhook bra;Thread/unthread right sleeve of pullover shirt/dresss;Thread/unthread left sleeve of pullover shirt/dress;Put head through opening of pull over shirt/dress;Pull shirt over trunk Upper body dressing/undressing: 5: Supervision: Safety issues/verbal cues FIM - Lower Body Dressing/Undressing Lower body dressing/undressing steps patient completed: Thread/unthread right pants leg;Thread/unthread left pants leg;Pull pants up/down;Fasten/unfasten pants;Don/Doff right shoe;Don/Doff left shoe;Fasten/unfasten right shoe;Fasten/unfasten left shoe Lower body dressing/undressing: 5: Supervision: Safety issues/verbal cues  FIM - Toileting Toileting steps completed by patient: Adjust clothing prior to toileting;Performs perineal hygiene;Adjust clothing after toileting Toileting Assistive Devices: Grab bar or rail for support Toileting: 5: Supervision: Safety issues/verbal cues  FIM - Diplomatic Services operational officer Devices: Best boy Transfers: 6-Assistive device: No helper;6-From toilet/BSC  FIM - Banker Devices: Therapist, occupational: 4: Supine > Sit: Min A (steadying Pt. > 75%/lift 1 leg);4: Sit > Supine: Min A (steadying pt. > 75%/lift 1 leg);4: Bed > Chair or W/C: Min A (steadying Pt. > 75%);4: Chair or W/C > Bed: Min A (steadying Pt. > 75%)  FIM - Locomotion:  Wheelchair Distance: 150 Locomotion: Wheelchair: 0: Activity did not occur FIM -  Locomotion: Ambulation Locomotion: Ambulation Assistive Devices: Designer, industrial/product Ambulation/Gait Assistance: 4: Min assist Locomotion: Ambulation: 2: Travels 50 - 149 ft with minimal assistance (Pt.>75%)  Comprehension Comprehension Mode: Auditory Comprehension: 5-Follows basic conversation/direction: With extra time/assistive device  Expression Expression Mode: Verbal Expression: 5-Expresses basic needs/ideas: With no assist  Social Interaction Social Interaction: 5-Interacts appropriately 90% of the time - Needs monitoring or encouragement for participation or interaction.  Problem Solving Problem Solving: 5-Solves basic 90% of the time/requires cueing < 10% of the time  Memory Memory: 5-Recognizes or recalls 90% of the time/requires cueing < 10% of the time   Medical Problem List and Plan:  1. Thrombotic right basal ganglia infarction  2. DVT Prophylaxis/Anticoagulation: SCDs. Follow clinically for now. 3. Pain Management: Tylenol as needed  4. Neuropsych: This patient is capable of making decisions on her own behalf.  5. Hypertension.Off Lisinopril 10 mg daily. Systolic BP elevated this am 6. Tobacco abuse. NicoDerm patch. Provide counseling for this as well as secondary stroke prevention  7. Gram-negative rod UTI. Bactrim 09/28/2013--7 day course. Afebrile off abx Denies any dysuria or hematuria, this may have contributed to ARF  8. Hyperlipidemia. Lipitor  9. FEN: encourage PO intake.   -improving D/C [place] 10.  ARF secondary to ACE-I resolved  11.  Neutropenia, likely Bactrim WBC increasing but   ANC~1.1-1.2 monitor, expect improvement   LOS (Days) 7 A FACE TO FACE EVALUATION WAS PERFORMED  Erick Colace 10/10/2013 6:48 AM

## 2013-10-10 NOTE — Progress Notes (Signed)
Physical Therapy Session Note  Patient Details  Name: Erika Wang MRN: [id number] Date of Birth: 03/25/1951  Today's Date: 10/10/2013 Time: 8:05-9:00 ( ) 13:02-13:45 ( )  Short Term Goals: Week 1:  PT Short Term Goal 1 (Week 1): Patient will be able to perform bed mobility independently. PT Short Term Goal 2 (Week 1): Patient will be able to perform transfers with S/Mod-Independent. PT Short Term Goal 3 (Week 1): Patient will be able to perform up/down 2 steps without handrail with S/Mod-Independent assist with LRAD. PT Short Term Goal 4 (Week 1): Patient will be able to perform gait using LRAD x 200' with S/Mod-I.  Skilled Therapeutic Interventions/Progress Updates:  1:2 Pt up in recliner, agreeabl to PT. Tx focused on navigating in room with RW, NMR, and gait training with/without device.  Pt navigating in room to gather clothing with RW, close S provided for safety, including retro walking and side stepping. Pt given cues for increasing independence, safety with device and strategies for mobility. Pt stood at sink with 1 UE support x16min for cleaning with cues for increasing LLE weight bearing, but pt not keen on adjusting standing technique.   Sit<>stand and stand-pivot transfers x5 throughout with S and several multiple attempts with RW. Pt needing cues for hand placement, but not always incorporating safe technique.   Gait in controlled and home setting with RW and close S 2x200' and 1x75' without RW. Pt given verbal and tactile cues for increasing L foot clearance to reduce falls risk.   Demonstrated static stance technique for even weight bearing in resting positions to increase LLE activation.  Step-taps 2x10 bil with min A for steadying to increase SLS balance.   Reviewed mobiltiy section of CVA notebook, including education on falls risk reduction and CVA risk reduction.   Pt left up in recliner without lap belt to trial safety using call bell  2:2 Tx focused on gait  training without device and with SPC, fall prevention x1 ex, and stairs.  All transfers, including toilet with S Gait without device with min-guard>>close S 2x300' with cues for L foot clearance and heel strike. Pt challenged with changes in speed, direction, and head turns. Pt with decreased L foot clearance with fatigue and distraction, but no LOB.  Trialed SPC for gait x200', but pt seemed to have decreased step continuity and L foot clearance with SPC even after practice. Will continue to reassess assistive device needs as needed.  Seated and standing fall prevention x1 ex with 3# weight for LAQ with 5 sec holds and marching to increase L step height in gait x10 each ex with cues for technique.  Stairs x8 with Min A overall. Pt wanting to attempt as home sep-up with no rail, allowed to use cane, but pt unable to safely perform, with L LOB at each step. Pt able to complete with 1 rail easily.  Hand off to SLP - Will update safety plan for pt not to need lap belt as she has been calling appropriately.   .      Therapy Documentation Precautions:  Precautions Precautions: Fall Precaution Comments: L hemiparesis Restrictions Weight Bearing Restrictions: No    Vital Signs: Therapy Vitals Temp: 97.7 F (36.5 C) Temp src: Oral Pulse Rate: 83 Resp: 16 BP: 140/86 mmHg Patient Position, if appropriate: Lying Oxygen Therapy SpO2: 97 % O2 Device: None (Room air)    See FIM for current functional status  Therapy/Group: Individual Therapy  Clydene Laming, PT, DPT  10/10/2013, 8:28 AM

## 2013-10-10 NOTE — Progress Notes (Signed)
Speech Language Pathology Daily Session Note  Patient Details  Name: Erika Wang MRN: [id number] Date of Birth: 1951-10-16  Today's Date: 10/10/2013 Time: 1345-1415 Time Calculation (min): 30 min  Short Term Goals: Week 2: SLP Short Term Goal 1 (Week 2): Pt will utilize external memory aids to recall new information with Mod I SLP Short Term Goal 2 (Week 2): Pt will demonstrate adequate medication management with use of external aids with Supervision SLP Short Term Goal 3 (Week 2): Pt will demonstrate intellectual awareness of physical and cognitive impairments with Supervision SLP Short Term Goal 4 (Week 2): Pt will demonstrate adequate problem solving with mildly complex tasks with Supervision  Skilled Therapeutic Interventions: Skilled treatment session focused on addressing cognitive goals. SLP facilitated session with money management task and patient required increased wait time to accurately complete task.  Patient was Mod I for recall of medications and functions.  SLP re-educated patient on plan regarding trials without quick release belt in room.  Patient able to direct SLP back to room and returned demonstration of call bell use Independently.  Continue with current plan of care.    FIM:  Comprehension Comprehension Mode: Auditory Comprehension: 5-Follows basic conversation/direction: With extra time/assistive device Expression Expression Mode: Verbal Expression: 7-Expresses complex ideas: With no assist Social Interaction Social Interaction: 5-Interacts appropriately 90% of the time - Needs monitoring or encouragement for participation or interaction. Problem Solving Problem Solving: 6-Solves complex problems: With extra time Memory Memory: 6-More than reasonable amt of time  Pain Pain Assessment Pain Assessment: No/denies pain  Therapy/Group: Individual Therapy  Charlane Ferretti., CCC-SLP [phone]  Couper Juncaj 10/10/2013, 2:38 PM

## 2013-10-10 NOTE — Progress Notes (Signed)
Occupational Therapy Session Note  Patient Details  Name: Erika Wang MRN: [id number] Date of Birth: 06-20-1951  Today's Date: 10/10/2013 Time: 1000-1100 Time Calculation (min): 60 min  Short Term Goals: Week 1:  OT Short Term Goal 1 (Week 1): Pt will complete toilet transfer with supervision and min verbal cues OT Short Term Goal 1 - Progress (Week 1): Met OT Short Term Goal 2 (Week 1): Pt will complet bathing with supervision and min verbal cues for safety OT Short Term Goal 2 - Progress (Week 1): Met OT Short Term Goal 3 (Week 1): Pt will complete home management task with supervision and min verbal cues for safety OT Short Term Goal 3 - Progress (Week 1): Met OT Short Term Goal 4 (Week 1): Pt will attend to L side during therapy session with min cues OT Short Term Goal 4 - Progress (Week 1): Met Week 2:  OT Short Term Goal 1 (Week 2): STGs = LTGs due to discharge next week  Skilled Therapeutic Interventions/Progress Updates:    Pt stated that she had already freshened up and dressed this am, she was not willing to take a shower due to feeling cold. Pt was very agreeable to working on meal prep tasks and homemaking. She ambulated to kitchen with RW and then in kitchen without RW.  She was able to find all supplies, cook an egg, carry supplies in kitchen, cleaned up all supplies and used good safety awareness with safe balance strategies. Pt ambulated back to room without RW with close S. Pt decided to relax in bed with call light in reach.  Therapy Documentation Precautions:  Precautions Precautions: Fall Precaution Comments: L hemiparesis Restrictions Weight Bearing Restrictions: No   Pain: Pain Assessment Pain Assessment: No/denies pain ADL:  See FIM for current functional status  Therapy/Group: Individual Therapy  SAGUIER,JULIA 10/10/2013, 12:01 PM

## 2013-10-11 ENCOUNTER — Inpatient Hospital Stay (HOSPITAL_COMMUNITY): Payer: BC Managed Care – PPO | Admitting: Physical Therapy

## 2013-10-11 NOTE — Progress Notes (Signed)
Physical Therapy Session Note  Patient Details  Name: Glennice Marcos MRN: [id number] Date of Birth: 10-10-1951  Today's Date: 10/11/2013 Time: 0815-0900 Time Calculation (min): 45 min  Short Term Goals: Week 1:  PT Short Term Goal 1 (Week 1): Patient will be able to perform bed mobility independently. PT Short Term Goal 2 (Week 1): Patient will be able to perform transfers with S/Mod-Independent. PT Short Term Goal 3 (Week 1): Patient will be able to perform up/down 2 steps without handrail with S/Mod-Independent assist with LRAD. PT Short Term Goal 4 (Week 1): Patient will be able to perform gait using LRAD x 200' with S/Mod-I. Therapy Documentation Precautions:  Precautions Precautions: Fall Precaution Comments: L hemiparesis Restrictions Weight Bearing Restrictions: No Pain: Pain Assessment Pain Assessment: No/denies pain  Gait Training:(15') 2 x 200' w/o assistive device S/min-assist required with 2 x of LOB toward left requiring min-assist for correction. Tendency to step short on L LE or to not have good foot clearance on L foot.  Occasional L LE scissoring across toward midline and resulting in LOB toward                           Left side. Gait perturbation with instructions that patient ambulate with wider BOS than normal and no LOB noted during this time. Therapeutic Activity:(15') Transfers sit<->stand, bed<->chair and toilet transfers are S/Independent. Dressing with patient minimally using L UE/hand.  Therapeutic Exercise:(15') Nu-step Level 6 x 10' with rest breaks   Therapy/Group: Individual Therapy  Lazar Tierce J 10/11/2013, 8:25 AM

## 2013-10-11 NOTE — Progress Notes (Signed)
Subjective/Complaints: No pains, slept well but still tired A 12 point review of systems has been performed and if not noted above is otherwise negative.   Objective: Vital Signs: Blood pressure 165/88, pulse 71, temperature 98.2 F (36.8 C), temperature source Oral, resp. rate 17, height 5' (1.524 m), weight 50.939 kg (112 lb 4.8 oz), SpO2 98.00%. No results found.  Recent Labs  10/09/13 0740  WBC 3.5*  HGB 12.8  HCT 39.0  PLT 199   No results found for this basename: NA, K, CL, CO, GLUCOSE, BUN, CREATININE, CALCIUM,  in the last 72 hours CBG (last 3)  No results found for this basename: GLUCAP,  in the last 72 hours  Wt Readings from Last 3 Encounters:  10/08/13 50.939 kg (112 lb 4.8 oz)  10/01/13 54.885 kg (121 lb)    Physical Exam:  Constitutional: She is oriented to person, place, and time. She is frail, side lying in bed HENT:  Head: Normocephalic.  Poor dentition, mucosa moist  Eyes: EOM are normal.  Neck: Normal range of motion. Neck supple. No thyromegaly present.  Cardiovascular: Normal rate and regular rhythm. No murmur  Respiratory: Effort normal and breath sounds normal. No respiratory distress. No wheezes or rales  GI: Soft. Bowel sounds are normal. She exhibits no distension. No pain Neurological: She is alert and oriented to person, place, and time. HOH Follows commands. . Left facial weakness and tongue deviation. Speech  intelligible. LUE is 4/5 deltoid, 4 at bicep and tricep, 4 HI. LLE is 4 HF and 4 KE, Ankle 4/5. No  sensory deficits.  Skin: Skin is warm and dry. Ecchymosis L arm and around IV site left forearm Psychiatric: She has a normal mood and affect. Her behavior is normal. Judgment and thought content normal   Assessment/Plan: 1. Functional deficits secondary to thrombotic right basal ganglia infarct which require 3+ hours per day of interdisciplinary therapy in a comprehensive inpatient rehab setting. Physiatrist is providing close team  supervision and 24 hour management of active medical problems listed below. Physiatrist and rehab team continue to assess barriers to discharge/monitor patient progress toward functional and medical goals.  FIM: FIM - Bathing Bathing Steps Patient Completed: Chest;Right Arm;Left Arm;Abdomen;Left upper leg;Right upper leg;Buttocks;Right lower leg (including foot);Left lower leg (including foot);Front perineal area Bathing: 5: Supervision: Safety issues/verbal cues  FIM - Upper Body Dressing/Undressing Upper body dressing/undressing steps patient completed: Pull shirt over trunk;Thread/unthread left sleeve of pullover shirt/dress;Put head through opening of pull over shirt/dress Upper body dressing/undressing: 5: Supervision: Safety issues/verbal cues FIM - Lower Body Dressing/Undressing Lower body dressing/undressing steps patient completed: Thread/unthread right pants leg;Thread/unthread left pants leg;Pull pants up/down;Fasten/unfasten pants;Don/Doff right sock;Don/Doff left sock Lower body dressing/undressing: 5: Supervision: Safety issues/verbal cues  FIM - Toileting Toileting steps completed by patient: Adjust clothing prior to toileting;Performs perineal hygiene;Adjust clothing after toileting Toileting Assistive Devices: Grab bar or rail for support Toileting: 5: Supervision: Safety issues/verbal cues  FIM - Diplomatic Services operational officer Devices: Best boy Transfers: 6-Assistive device: No helper;6-From toilet/BSC  FIM - Banker Devices: Arm rests Bed/Chair Transfer: 5: Bed > Chair or W/C: Supervision (verbal cues/safety issues);5: Chair or W/C > Bed: Supervision (verbal cues/safety issues)  FIM - Locomotion: Wheelchair Distance: 150 Locomotion: Wheelchair: 0: Activity did not occur FIM - Locomotion: Ambulation Locomotion: Ambulation Assistive Devices: Emergency planning/management officer Ambulation/Gait Assistance: 4: Min  guard Locomotion: Ambulation: 4: Travels 150 ft or more with minimal assistance (Pt.>75%)  Comprehension Comprehension Mode: Auditory  Comprehension: 5-Follows basic conversation/direction: With extra time/assistive device  Expression Expression Mode: Verbal Expression: 7-Expresses complex ideas: With no assist  Social Interaction Social Interaction: 5-Interacts appropriately 90% of the time - Needs monitoring or encouragement for participation or interaction.  Problem Solving Problem Solving: 6-Solves complex problems: With extra time  Memory Memory: 6-More than reasonable amt of time   Medical Problem List and Plan:  1. Thrombotic right basal ganglia infarction  2. DVT Prophylaxis/Anticoagulation: SCDs. Follow clinically for now. 3. Pain Management: Tylenol as needed  4. Neuropsych: This patient is capable of making decisions on her own behalf.  5. Hypertension.Off Lisinopril 10 mg daily. Systolic BP elevated this am 6. Tobacco abuse. NicoDerm patch. Provide counseling for this as well as secondary stroke prevention   7. Hyperlipidemia. Lipitor  8. FEN: encourage PO intake.    9.  .  Neutropenia, likely Bactrim WBC increasing but   ANC~1.1-1.2 monitor, expect improvement   LOS (Days) 8 A FACE TO FACE EVALUATION WAS PERFORMED  Claudette Laws E 10/11/2013 6:40 AM

## 2013-10-12 ENCOUNTER — Inpatient Hospital Stay (HOSPITAL_COMMUNITY): Payer: BC Managed Care – PPO | Admitting: *Deleted

## 2013-10-12 MED ORDER — ATENOLOL 25 MG PO TABS
25.0000 mg | ORAL_TABLET | Freq: Every day | ORAL | Status: DC
  Administered 2013-10-12 – 2013-10-15 (×4): 25 mg via ORAL
  Filled 2013-10-12 (×5): qty 1

## 2013-10-12 NOTE — Progress Notes (Signed)
Occupational Therapy Note Therapy Note  Patient Details  Name: Erika Wang MRN: [id number] Date of Birth: [birth year] Today's Date: 10/12/2013 Time:  1430-1530  (60 min) Pain:none Individual session   Addressed functional mobility, standing balance, endurance, LUE usage.  Pt. Ambulated from room to laundry room.  Placed clothes in washer.  Ambulated to gym with RW.   Enaged in W[employer] game in standing for 20 minuites with no LOB.  Ambulated back to bacthroom and was supervision with toileting.  Ambultated to laundry room to check clothes.  Ambulated back to room on 4100.  Pr. Recalled 2/3 options to retreive laundry at end of session. Left pt in wc with call bell,phone within reach.      Humberto Seals 10/12/2013, 3:30 PM

## 2013-10-12 NOTE — Progress Notes (Signed)
Subjective/Complaints: No pains, slept well but still tired A 12 point review of systems has been performed and if not noted above is otherwise negative.   Objective: Vital Signs: Blood pressure 159/95, pulse 73, temperature 97.5 F (36.4 C), temperature source Oral, resp. rate 16, height 5' (1.524 m), weight 50.939 kg (112 lb 4.8 oz), SpO2 98.00%. No results found.  Recent Labs  10/09/13 0740  WBC 3.5*  HGB 12.8  HCT 39.0  PLT 199   No results found for this basename: NA, K, CL, CO, GLUCOSE, BUN, CREATININE, CALCIUM,  in the last 72 hours CBG (last 3)  No results found for this basename: GLUCAP,  in the last 72 hours  Wt Readings from Last 3 Encounters:  10/08/13 50.939 kg (112 lb 4.8 oz)  10/01/13 54.885 kg (121 lb)    Physical Exam:  Constitutional: She is oriented to person, place, and time. She is frail, side lying in bed HENT:  Head: Normocephalic.  Poor dentition, mucosa moist  Eyes: EOM are normal.  Neck: Normal range of motion. Neck supple. No thyromegaly present.  Cardiovascular: Normal rate and regular rhythm. No murmur  Respiratory: Effort normal and breath sounds normal. No respiratory distress. No wheezes or rales  GI: Soft. Bowel sounds are normal. She exhibits no distension. No pain Neurological: She is alert and oriented to person, place, and time. HOH Follows commands. . Left facial weakness and tongue deviation. Speech  intelligible. LUE is 4/5 deltoid, 4 at bicep and tricep, 4 HI. LLE is 4 HF and 4 KE, Ankle 4/5. No  sensory deficits.  Skin: Skin is warm and dry. Ecchymosis L arm and around IV site left forearm Psychiatric: She has a normal mood and affect. Her behavior is normal. Judgment and thought content normal   Assessment/Plan: 1. Functional deficits secondary to thrombotic right basal ganglia infarct which require 3+ hours per day of interdisciplinary therapy in a comprehensive inpatient rehab setting. Physiatrist is providing close team  supervision and 24 hour management of active medical problems listed below. Physiatrist and rehab team continue to assess barriers to discharge/monitor patient progress toward functional and medical goals.  FIM: FIM - Bathing Bathing Steps Patient Completed: Chest;Right Arm;Left Arm;Abdomen;Left upper leg;Right upper leg;Buttocks;Right lower leg (including foot);Left lower leg (including foot);Front perineal area Bathing: 5: Supervision: Safety issues/verbal cues  FIM - Upper Body Dressing/Undressing Upper body dressing/undressing steps patient completed: Pull shirt over trunk;Thread/unthread left sleeve of pullover shirt/dress;Put head through opening of pull over shirt/dress Upper body dressing/undressing: 5: Supervision: Safety issues/verbal cues FIM - Lower Body Dressing/Undressing Lower body dressing/undressing steps patient completed: Thread/unthread right pants leg;Thread/unthread left pants leg;Pull pants up/down;Fasten/unfasten pants;Don/Doff right sock;Don/Doff left sock Lower body dressing/undressing: 5: Supervision: Safety issues/verbal cues  FIM - Toileting Toileting steps completed by patient: Adjust clothing prior to toileting;Performs perineal hygiene;Adjust clothing after toileting Toileting Assistive Devices: Grab bar or rail for support Toileting: 5: Supervision: Safety issues/verbal cues  FIM - Diplomatic Services operational officer Devices: Grab bars;Walker Toilet Transfers: 5-To toilet/BSC: Supervision (verbal cues/safety issues);5-From toilet/BSC: Supervision (verbal cues/safety issues)  FIM - Press photographer Assistive Devices: Arm rests Bed/Chair Transfer: 5: Bed > Chair or W/C: Supervision (verbal cues/safety issues);5: Chair or W/C > Bed: Supervision (verbal cues/safety issues)  FIM - Locomotion: Wheelchair Distance: 150 Locomotion: Wheelchair: 0: Activity did not occur FIM - Locomotion: Ambulation Locomotion: Ambulation Assistive  Devices: Emergency planning/management officer Ambulation/Gait Assistance: 4: Min guard Locomotion: Ambulation: 4: Travels 150 ft or more with minimal assistance (  Pt.>75%)  Comprehension Comprehension Mode: Auditory Comprehension: 5-Understands complex 90% of the time/Cues < 10% of the time  Expression Expression Mode: Verbal Expression: 5-Expresses complex 90% of the time/cues < 10% of the time  Social Interaction Social Interaction: 5-Interacts appropriately 90% of the time - Needs monitoring or encouragement for participation or interaction.  Problem Solving Problem Solving: 5-Solves complex 90% of the time/cues < 10% of the time  Memory Memory: 5-Recognizes or recalls 90% of the time/requires cueing < 10% of the time   Medical Problem List and Plan:  1. Thrombotic right basal ganglia infarction  2. DVT Prophylaxis/Anticoagulation: SCDs. Follow clinically for now. 3. Pain Management: Tylenol as needed  4. Neuropsych: This patient is capable of making decisions on her own behalf.  5. Hypertension.Acxute renal failure with Lisinopril 10 mg daily. Systolic BP elevated this am, trial BB 6. Tobacco abuse. NicoDerm patch. Provide counseling for this as well as secondary stroke prevention   7. Hyperlipidemia. Lipitor  8. FEN: encourage PO intake.    9.  .  Neutropenia, likely Bactrim WBC increasing but   ANC~1.1-1.2 monitor, expect improvement,recheck CBC   LOS (Days) 9 A FACE TO FACE EVALUATION WAS PERFORMED  Erick Colace 10/12/2013 6:32 AM

## 2013-10-13 ENCOUNTER — Inpatient Hospital Stay (HOSPITAL_COMMUNITY): Payer: BC Managed Care – PPO

## 2013-10-13 ENCOUNTER — Inpatient Hospital Stay (HOSPITAL_COMMUNITY): Payer: BC Managed Care – PPO | Admitting: *Deleted

## 2013-10-13 ENCOUNTER — Inpatient Hospital Stay (HOSPITAL_COMMUNITY): Payer: BC Managed Care – PPO | Admitting: Physical Therapy

## 2013-10-13 DIAGNOSIS — G8190 Hemiplegia, unspecified affecting unspecified side: Secondary | ICD-10-CM

## 2013-10-13 DIAGNOSIS — I6350 Cerebral infarction due to unspecified occlusion or stenosis of unspecified cerebral artery: Secondary | ICD-10-CM

## 2013-10-13 DIAGNOSIS — N179 Acute kidney failure, unspecified: Secondary | ICD-10-CM

## 2013-10-13 LAB — CBC
HCT: 39.1 % (ref 36.0–46.0)
Hemoglobin: 13.1 g/dL (ref 12.0–15.0)
MCH: 30.3 pg (ref 26.0–34.0)
MCV: 90.3 fL (ref 78.0–100.0)
Platelets: 264 10*3/uL (ref 150–400)
RBC: 4.33 MIL/uL (ref 3.87–5.11)
WBC: 4.7 10*3/uL (ref 4.0–10.5)

## 2013-10-13 NOTE — Progress Notes (Signed)
NUTRITION FOLLOW-UP  DOCUMENTATION CODES Per approved criteria  -Not Applicable   INTERVENTION: Discontinue Resource Breeze po daily - pt refusing. Allow family to bring in foods for patient - per RN, pt with specific food preferences. RD to continue to follow nutrition care plan.  NUTRITION DIAGNOSIS: Inadequate oral intake related to variable appetite and food preferences as evidenced by poor oral intake. Improving.  Goal: Intake to meet >90% of estimated nutrition needs.  Monitor:  weight trends, lab trends, I/O's, PO intake, supplement tolerance  ASSESSMENT: PMHx significant for tobacco abuse. Admitted s/p R basal ganglia infarct. Pt with poor PO intake. SLP completed BSE on 12/6. Pt is currently consuming Dysphagia 3 diet with chopped meats and thin liquids. Pt with missing dentition.  Potassium remains elevated; pt often refusing Kayexalate.  Plan for d/c on 12/17.  Advanced to Regular diet on 12/8. Meal intake remains variable, pt is consuming 25-50% of meals. Pt does not like [employer] - will discontinue.  Height: Ht Readings from Last 1 Encounters:  10/03/13 5' (1.524 m)    Weight: Wt Readings from Last 1 Encounters:  10/08/13 112 lb 4.8 oz (50.939 kg)  Admit wt 111 lb - stable  BMI:  Body mass index is 21.93 kg/(m^2). Normal weight  Estimated Nutritional Needs: Kcal: 1500 - 1700 Protein: 60 - 70 g Fluid: 1.5 - 1.7 liters  Skin: intact  Diet Order: General   EDUCATION NEEDS: -No education needs identified at this time   Intake/Output Summary (Last 24 hours) at 10/13/13 1159 Last data filed at 10/13/13 0800  Gross per 24 hour  Intake    720 ml  Output      0 ml  Net    720 ml    Last BM: 12/14  Labs:   Recent Labs Lab 10/06/13 1244 10/06/13 1650 10/07/13 0625  NA 129* 131* 134*  K 6.5* 6.8* 5.3*  CL 94* 97 99  CO2 24 24 26   BUN 37* 37* 28*  CREATININE 2.26* 2.09* 1.37*  CALCIUM 10.1 9.9 9.2  GLUCOSE 131* 97 99    CBG  (last 3)  No results found for this basename: GLUCAP,  in the last 72 hours  Scheduled Meds: . aspirin EC  325 mg Oral Daily  . atenolol  25 mg Oral Daily  . atorvastatin  10 mg Oral q1800  . feeding supplement (RESOURCE BREEZE)  1 Container Oral Q24H  . nicotine  7 mg Transdermal Daily  . pantoprazole  40 mg Oral Daily    Continuous Infusions:   Jarold Motto MS, RD, LDN Pager: [phone] After-hours pager: [phone]

## 2013-10-13 NOTE — Progress Notes (Signed)
Subjective/Complaints: No pains, slept well but still tired A 12 point review of systems has been performed and if not noted above is otherwise negative.   Objective: Vital Signs: Blood pressure 115/80, pulse 71, temperature 97.4 F (36.3 C), temperature source Oral, resp. rate 17, height 5' (1.524 m), weight 50.939 kg (112 lb 4.8 oz), SpO2 98.00%. No results found. No results found for this basename: WBC, HGB, HCT, PLT,  in the last 72 hours No results found for this basename: NA, K, CL, CO, GLUCOSE, BUN, CREATININE, CALCIUM,  in the last 72 hours CBG (last 3)  No results found for this basename: GLUCAP,  in the last 72 hours  Wt Readings from Last 3 Encounters:  10/08/13 50.939 kg (112 lb 4.8 oz)  10/01/13 54.885 kg (121 lb)    Physical Exam:  Constitutional: She is oriented to person, place, and time. She is frail, side lying in bed HENT:  Head: Normocephalic.  Poor dentition, mucosa moist  Eyes: EOM are normal.  Neck: Normal range of motion. Neck supple. No thyromegaly present.  Cardiovascular: Normal rate and regular rhythm. No murmur  Respiratory: Effort normal and breath sounds normal. No respiratory distress. No wheezes or rales  GI: Soft. Bowel sounds are normal. She exhibits no distension. No pain Neurological: She is alert and oriented to person, place, and time. HOH Follows commands. . Left facial weakness and tongue deviation. Speech  intelligible.Skin: Skin is warm and dry. Ecchymosis L arm and around IV site left forearm Psychiatric: She has a normal mood and affect. Her behavior is normal. Judgment and thought content normal   Assessment/Plan: 1. Functional deficits secondary to thrombotic right basal ganglia infarct which require 3+ hours per day of interdisciplinary therapy in a comprehensive inpatient rehab setting. Physiatrist is providing close team supervision and 24 hour management of active medical problems listed below. Physiatrist and rehab team  continue to assess barriers to discharge/monitor patient progress toward functional and medical goals.  FIM: FIM - Bathing Bathing Steps Patient Completed: Chest;Right Arm;Left Arm;Abdomen;Left upper leg;Right upper leg;Buttocks;Right lower leg (including foot);Left lower leg (including foot);Front perineal area Bathing: 5: Supervision: Safety issues/verbal cues  FIM - Upper Body Dressing/Undressing Upper body dressing/undressing steps patient completed: Pull shirt over trunk;Thread/unthread left sleeve of pullover shirt/dress;Put head through opening of pull over shirt/dress Upper body dressing/undressing: 5: Supervision: Safety issues/verbal cues FIM - Lower Body Dressing/Undressing Lower body dressing/undressing steps patient completed: Thread/unthread right pants leg;Thread/unthread left pants leg;Pull pants up/down;Fasten/unfasten pants;Don/Doff right sock;Don/Doff left sock Lower body dressing/undressing: 5: Supervision: Safety issues/verbal cues  FIM - Toileting Toileting steps completed by patient: Adjust clothing prior to toileting;Performs perineal hygiene;Adjust clothing after toileting Toileting Assistive Devices: Grab bar or rail for support Toileting: 5: Supervision: Safety issues/verbal cues  FIM - Diplomatic Services operational officer Devices: Grab bars;Walker Toilet Transfers: 5-To toilet/BSC: Supervision (verbal cues/safety issues);5-From toilet/BSC: Supervision (verbal cues/safety issues)  FIM - Press photographer Assistive Devices: Arm rests;Walker Bed/Chair Transfer: 5: Bed > Chair or W/C: Supervision (verbal cues/safety issues);5: Chair or W/C > Bed: Supervision (verbal cues/safety issues)  FIM - Locomotion: Wheelchair Distance: 150 Locomotion: Wheelchair: 0: Activity did not occur FIM - Locomotion: Ambulation Locomotion: Ambulation Assistive Devices: Designer, industrial/product Ambulation/Gait Assistance: 5: Supervision Locomotion: Ambulation: 4:  Travels 150 ft or more with minimal assistance (Pt.>75%)  Comprehension Comprehension Mode: Auditory Comprehension: 5-Understands complex 90% of the time/Cues < 10% of the time  Expression Expression Mode: Verbal Expression: 5-Expresses complex 90% of the time/cues <  10% of the time  Social Interaction Social Interaction: 5-Interacts appropriately 90% of the time - Needs monitoring or encouragement for participation or interaction.  Problem Solving Problem Solving: 5-Solves complex 90% of the time/cues < 10% of the time  Memory Memory: 5-Recognizes or recalls 90% of the time/requires cueing < 10% of the time   Medical Problem List and Plan:  1. Thrombotic right basal ganglia infarction  2. DVT Prophylaxis/Anticoagulation: SCDs. Follow clinically for now. 3. Pain Management: Tylenol as needed  4. Neuropsych: This patient is capable of making decisions on her own behalf.  5. Hypertension.Acxute renal failure with Lisinopril 10 mg daily. Systolic BP elevated this am, trial BB 6. Tobacco abuse. NicoDerm patch. Provide counseling for this as well as secondary stroke prevention   7. Hyperlipidemia. Lipitor  8. FEN: encourage PO intake.    9.  .  Neutropenia, likely Bactrim WBC increasing but   ANC~1.1-1.2 monitor, expect improvement,recheck CBC   LOS (Days) 10 A FACE TO FACE EVALUATION WAS PERFORMED  Claudette Laws E 10/13/2013 5:56 AM

## 2013-10-13 NOTE — Progress Notes (Signed)
Physical Therapy Note  Patient Details  Name: Erika Wang MRN: [id number] Date of Birth: 06-04-1951 Today's Date: 10/13/2013  Time: 1330-1357 27 minutes  1:1 no c/o pain.  Treatment session focused on strength and coordination per pt request.  Pt performed standing balance exercises with tapping all directions with reciprocal LEs with min A for balance.  Mini squats and heel raises for mm strengthening.  Standing B trunk PNFs holding theraball with min A for balance with trunk rotation activities.  Pt with decreased awareness of losses of balance, delayed balance reactions.   Cove Haydon 10/13/2013, 2:03 PM

## 2013-10-13 NOTE — Progress Notes (Signed)
Physical Therapy Note  Patient Details  Name: Britley Gashi MRN: [id number] Date of Birth: 01/04/1951 Today's Date: 10/13/2013  Time: 830-927 57 minutes  1:1 No c/o pain.  Gait with RW in controlled environment with supervision, cuing for L foot clearance as pt tends to drag L foot especially during turns.  Stepping over obstacles with supervision, mod cuing for safety and sequencing.  Obstacle negotiation with supervision, mod cuing for L attention and problem solving.  Side stepping with supervision, cues to take larger step with L LE.  Stair negotiation with 2 handrails with close supervision, pt able to take stairs reciprocally.  Step ups with L LE for strengthening 3 x 10 with min A for balance.  Floor transfer with supervision with pt educated on using large, sturdy piece of furniture to help her get up if she falls.  Pt educated on fall safety and recovery.  Pt states that at her home she sleeps on a mattress on the floor.  Simulated standing from this surface and pt requires min A.  Pt/PT problem solved ideas to make this situation easier.   Pt needs further education on home safety and set up.   DONAWERTH,KAREN 10/13/2013, 9:28 AM

## 2013-10-13 NOTE — Progress Notes (Signed)
Occupational Therapy Note  Patient Details  Name: Erika Wang MRN: [id number] Date of Birth: [birth year] Today's Date: 10/13/2013  Time: 1000-1100  (60 min) Pain:none Individual session   Addressed functional mobility, standing balance, endurance, LUE usage.  Pt. Reported she had already bathed and dressed.   Ambulated to OT apartment.   Utilized LUE to wash and cut vegetables.  Needed cues to turn head to right when looking for items in kitchen.  Ambulated with RW with supervision and no LOB.  Ambulated back to room and left on EOB with call bell in reach.      Humberto Seals 10/13/2013, 10:14 AM

## 2013-10-13 NOTE — Progress Notes (Signed)
Speech Language Pathology Daily Session Note  Patient Details  Name: Erika Wang MRN: [id number] Date of Birth: 01/30/1951  Today's Date: 10/13/2013 Time: 1415-1500 Time Calculation (min): 45 min  Short Term Goals: Week 2: SLP Short Term Goal 1 (Week 2): Pt will utilize external memory aids to recall new information with Mod I SLP Short Term Goal 2 (Week 2): Pt will demonstrate adequate medication management with use of external aids with Supervision SLP Short Term Goal 3 (Week 2): Pt will demonstrate intellectual awareness of physical and cognitive impairments with Supervision SLP Short Term Goal 4 (Week 2): Pt will demonstrate adequate problem solving with mildly complex tasks with Supervision  Skilled Therapeutic Interventions: Treatment focused on cognitive goals. SLP facilitated session with medication management task. Pt utilized external memory aid with Mod I for recall of medications and functions. Pt utilized pill box with Supervision level question cues for accuracy and problem solving. SLP reinforced recommendation for supervision upon discharge home, particularly with medication management. Pt hesitant to ask for help, although does report that she plans to go home with daughter for a few days. Pt demonstrated intellectual awareness of physical and cognitive impairments with supervision level cues. Continue plan of care.   FIM:  Comprehension Comprehension Mode: Auditory Comprehension: 5-Understands complex 90% of the time/Cues < 10% of the time Expression Expression Mode: Verbal Expression: 5-Expresses complex 90% of the time/cues < 10% of the time Social Interaction Social Interaction: 5-Interacts appropriately 90% of the time - Needs monitoring or encouragement for participation or interaction. Problem Solving Problem Solving: 5-Solves complex 90% of the time/cues < 10% of the time Memory Memory: 5-Recognizes or recalls 90% of the time/requires cueing < 10% of the  time  Pain Pain Assessment Pain Assessment: No/denies pain  Therapy/Group: Individual Therapy   Maxcine Ham, M.A. CCC-SLP [phone]   Maxcine Ham 10/13/2013, 4:44 PM

## 2013-10-14 ENCOUNTER — Inpatient Hospital Stay (HOSPITAL_COMMUNITY): Payer: BC Managed Care – PPO | Admitting: *Deleted

## 2013-10-14 ENCOUNTER — Inpatient Hospital Stay (HOSPITAL_COMMUNITY): Payer: BC Managed Care – PPO | Admitting: Occupational Therapy

## 2013-10-14 ENCOUNTER — Inpatient Hospital Stay (HOSPITAL_COMMUNITY): Payer: BC Managed Care – PPO

## 2013-10-14 NOTE — Progress Notes (Signed)
Social Work Patient ID: Erika Wang, female   DOB: 01-Dec-1950, 62 y.o.   MRN: [id number] Met with pt and daughter when here to discuss our concerns and recommendations of supervision level.  She will come in tomorrow at 11;00 to do some education with PT and Try to convince her Mom to stay longer with her at discharge.  Both aware pt can make her own decisions but team wants her to be safe at home.

## 2013-10-14 NOTE — Progress Notes (Signed)
Subjective/Complaints: Looking fwd to D/C in am A 12 point review of systems has been performed and if not noted above is otherwise negative.   Objective: Vital Signs: Blood pressure 127/69, pulse 70, temperature 97.6 F (36.4 C), temperature source Oral, resp. rate 17, height 5' (1.524 m), weight 50.939 kg (112 lb 4.8 oz), SpO2 98.00%. No results found.  Recent Labs  10/13/13 0750  WBC 4.7  HGB 13.1  HCT 39.1  PLT 264   No results found for this basename: NA, K, CL, CO, GLUCOSE, BUN, CREATININE, CALCIUM,  in the last 72 hours CBG (last 3)  No results found for this basename: GLUCAP,  in the last 72 hours  Wt Readings from Last 3 Encounters:  10/08/13 50.939 kg (112 lb 4.8 oz)  10/01/13 54.885 kg (121 lb)    Physical Exam:  Constitutional: She is oriented to person, place, and time. She is frail, side lying in bed HENT:  Head: Normocephalic.  Poor dentition, mucosa moist  Eyes: EOM are normal.  Neck: Normal range of motion. Neck supple. No thyromegaly present.  Cardiovascular: Normal rate and regular rhythm. No murmur  Respiratory: Effort normal and breath sounds normal. No respiratory distress. No wheezes or rales  GI: Soft. Bowel sounds are normal. She exhibits no distension. No pain Neurological: She is alert and oriented to person, place, and time. HOH Follows commands. . Left facial weakness and tongue deviation. Speech  intelligible.Skin: Skin is warm and dry. Ecchymosis L arm and around IV site left forearm Psychiatric: She has a normal mood and affect. Her behavior is normal. Judgment and thought content normal   Assessment/Plan: 1. Functional deficits secondary to thrombotic right basal ganglia infarct which require 3+ hours per day of interdisciplinary therapy in a comprehensive inpatient rehab setting. Physiatrist is providing close team supervision and 24 hour management of active medical problems listed below. Physiatrist and rehab team continue to assess  barriers to discharge/monitor patient progress toward functional and medical goals.  FIM: FIM - Bathing Bathing Steps Patient Completed: Chest;Right Arm;Left Arm;Abdomen;Left upper leg;Right upper leg;Buttocks;Right lower leg (including foot);Left lower leg (including foot);Front perineal area Bathing: 5: Supervision: Safety issues/verbal cues  FIM - Upper Body Dressing/Undressing Upper body dressing/undressing steps patient completed: Pull shirt over trunk;Thread/unthread left sleeve of pullover shirt/dress;Put head through opening of pull over shirt/dress Upper body dressing/undressing: 5: Supervision: Safety issues/verbal cues FIM - Lower Body Dressing/Undressing Lower body dressing/undressing steps patient completed: Thread/unthread right pants leg;Thread/unthread left pants leg;Pull pants up/down;Fasten/unfasten pants;Don/Doff right sock;Don/Doff left sock Lower body dressing/undressing: 5: Supervision: Safety issues/verbal cues  FIM - Toileting Toileting steps completed by patient: Adjust clothing prior to toileting;Performs perineal hygiene;Adjust clothing after toileting Toileting Assistive Devices: Grab bar or rail for support Toileting: 5: Supervision: Safety issues/verbal cues  FIM - Diplomatic Services operational officer Devices: Grab bars;Walker Toilet Transfers: 5-To toilet/BSC: Supervision (verbal cues/safety issues);5-From toilet/BSC: Supervision (verbal cues/safety issues)  FIM - Press photographer Assistive Devices: Arm rests;Walker Bed/Chair Transfer: 5: Bed > Chair or W/C: Supervision (verbal cues/safety issues);5: Chair or W/C > Bed: Supervision (verbal cues/safety issues)  FIM - Locomotion: Wheelchair Distance: 150 Locomotion: Wheelchair: 0: Activity did not occur FIM - Locomotion: Ambulation Locomotion: Ambulation Assistive Devices: Designer, industrial/product Ambulation/Gait Assistance: 5: Supervision Locomotion: Ambulation: 4: Travels 150 ft or  more with minimal assistance (Pt.>75%)  Comprehension Comprehension Mode: Auditory Comprehension: 5-Understands complex 90% of the time/Cues < 10% of the time  Expression Expression Mode: Verbal Expression: 5-Expresses complex 90% of  the time/cues < 10% of the time  Social Interaction Social Interaction: 5-Interacts appropriately 90% of the time - Needs monitoring or encouragement for participation or interaction.  Problem Solving Problem Solving: 6-Solves complex problems: With extra time  Memory Memory: 5-Recognizes or recalls 90% of the time/requires cueing < 10% of the time   Medical Problem List and Plan:  1. Thrombotic right basal ganglia infarction  2. DVT Prophylaxis/Anticoagulation: SCDs. Follow clinically for now. 3. Pain Management: Tylenol as needed  4. Neuropsych: This patient is capable of making decisions on her own behalf.  5. Hypertension.Acxute renal failure with Lisinopril 10 mg daily. Systolic BP elevated this am, trial BB 6. Tobacco abuse. NicoDerm patch. Provide counseling for this as well as secondary stroke prevention   7. Hyperlipidemia. Lipitor  8. FEN: encourage PO intake.    9.  .  Neutropenia, likely Bactrim WBC increasing but   ANC~1.1-1.2 monitor, expect improvement,recheck CBC   LOS (Days) 11 A FACE TO FACE EVALUATION WAS PERFORMED  Erika Wang E 10/14/2013 8:41 AM

## 2013-10-14 NOTE — Progress Notes (Addendum)
Physical Therapy Discharge Summary  Patient Details  Name: Erika Wang MRN: [id number] Date of Birth: 05/06/1951  Today's Date: 10/14/2013 Time: 8:05-9:02 ( ) and 14:30-15:05 ( )   Patient has met 10 of 11 long term goals due to improved activity tolerance, improved balance, improved postural control, increased strength and functional use of  left lower extremity.  Patient to discharge at an ambulatory level Supervision.   Patient's care partner is independent to provide the necessary cognitive assistance at discharge.  Reasons goals not met: Pt needed more assistance for stairs, which would have been needed had she gone home alone. However, pt will be going to daughter's house and will have level of care required.   Recommendation:  Patient will benefit from ongoing skilled PT services in home health setting to continue to advance safe functional mobility, address ongoing impairments in LLE strength and coordination, balance and safety awareness, and minimize fall risk.  Equipment: RW  Reasons for discharge: treatment goals met and discharge from hospital  Patient/family agrees with progress made and goals achieved: Yes  1:2 Reasessed Berg Balance with score improvement from 31/56 to 41/56, which was a significant iimprovement, yet still in significant risk of falls group. Pt educated on findings and functional implications.  Educated pt and performed ramp, curb step, and car transfer, all with S with cues for sequence. Pt unable to reproduce safe sequence for curb step or stairs without cues.  Gait with RW in controlled setting, home setting x320' and x40' respectively with S and cues for L foot clearance, which starts to drag in busy or distracting environments. Pt able to demonstrate dynamic sitting balance with Mod I and safe hand placement, S for dynamic standing during functional tasks with cues for L foot placement.  Bed mobility in home setting Independently, and  discussed couch at daughters house.   2:2 Pt participated in community gait and mobility 3x1x>500' with RW and S over varying and uneven surfaces and inclines.  Pt able to navigate busy settings and tight spaces with S. Pt had no LOB, but needed cues for safe L LE placement while standing to look at items in shop, no seated rest breaks needed. Discussed typical community access and trouble shooting possible difficulties. Performed transfers from varying surfaces and furniture.  Daughter was in her room at end of tx. Discussed need for 24hr S as well as safety cues. Discussed that it would be best to have brief fam ed tomorrow morning before DC to review all mobility needs. Pt/family in agreement. Pt has no further concerns, and feels safe to DC to her home without assistance. She was reminded of team recommendation.    PT Discharge Precautions/Restrictions Precautions Precautions: Fall Precaution Comments: L-sided weakness Restrictions Weight Bearing Restrictions: No Vital Signs Therapy Vitals Temp: 97.6 F (36.4 C) Temp src: Oral Pulse Rate: 70 Resp: 17 BP: 127/69 mmHg Patient Position, if appropriate: Lying Oxygen Therapy SpO2: 98 % O2 Device: None (Room air) Pain none   Vision/Perception  Vision - History Baseline Vision: Wears glasses only for reading Patient Visual Report: No change from baseline Vision - Assessment Eye Alignment: Within Functional Limits Perception Perception: Within Functional Limits Praxis Praxis: Intact  Cognition Overall Cognitive Status: Within Functional Limits for tasks assessed Arousal/Alertness: Awake/alert Orientation Level: Oriented X4 Sensation Sensation Light Touch: Appears Intact Proprioception: Appears Intact Coordination Gross Motor Movements are Fluid and Coordinated: No Fine Motor Movements are Fluid and Coordinated: No Coordination and Movement Description: Grately improved since eval, but still with  some minimal delay in  timing and excursion Heel Shin Test: Slight decrease timing and excursion with L on R Motor  Motor Motor: Hemiplegia Motor - Skilled Clinical Observations: Weaker on L with decreased speed and excursion of timing for LLE movements Motor - Discharge Observations: Improved muscle activation since eval overall, but some weakness remains  Mobility Bed Mobility Bed Mobility: Supine to Sit;Sit to Supine Supine to Sit: 7: Independent Sit to Supine: 7: Independent Transfers Transfers: Yes Stand Pivot Transfers: 5: Supervision Floor transfer bed>< carpet with supervision, without cues Locomotion  Ambulation Ambulation: Yes Ambulation/Gait Assistance: 5: Supervision Assistive device: Rolling walker Stairs / Additional Locomotion Stairs: Yes Stairs Assistance: 5: Supervision Stairs Assistance Details (indicate cue type and reason): Verbal cues for sequence Stair Management Technique: Two rails;Step to pattern;Forwards Number of Stairs: 12 Height of Stairs: 6 Wheelchair Mobility Wheelchair Mobility: No  Trunk/Postural Assessment  Cervical Assessment Cervical Assessment: Within Functional Limits Thoracic Assessment Thoracic Assessment: Within Functional Limits Lumbar Assessment Lumbar Assessment: Within Functional Limits  Balance Balance Balance Assessed: Yes Standardized Balance Assessment Standardized Balance Assessment: Berg Balance Test Berg Balance Test Sit to Stand: Able to stand  independently using hands Standing Unsupported: Able to stand 2 minutes with supervision Sitting with Back Unsupported but Feet Supported on Floor or Stool: Able to sit safely and securely 2 minutes Stand to Sit: Sits safely with minimal use of hands Transfers: Able to transfer safely, definite need of hands Standing Unsupported with Eyes Closed: Able to stand 10 seconds with supervision Standing Ubsupported with Feet Together: Able to place feet together independently and stand for 1 minute with  supervision From Standing, Reach Forward with Outstretched Arm: Can reach confidently >25 cm (10") From Standing Position, Pick up Object from Floor: Able to pick up shoe safely and easily From Standing Position, Turn to Look Behind Over each Shoulder: Looks behind from both sides and weight shifts well Turn 360 Degrees: Able to turn 360 degrees safely one side only in 4 seconds or less Standing Unsupported, Alternately Place Feet on Step/Stool: Needs assistance to keep from falling or unable to try Standing Unsupported, One Foot in Front: Needs help to step but can hold 15 seconds Standing on One Leg: Able to lift leg independently and hold equal to or more than 3 seconds Total Score: 41 Static Sitting Balance Static Sitting - Balance Support: No upper extremity supported;Feet supported Static Sitting - Level of Assistance: 7: Independent Dynamic Sitting Balance Dynamic Sitting - Balance Support: Feet supported Dynamic Sitting - Level of Assistance: 7: Independent Static Standing Balance Static Standing - Balance Support: No upper extremity supported;During functional activity Static Standing - Level of Assistance: 5: Stand by assistance Extremity Assessment    RLE - WFL LLE - 4-/5 strength throughout, sensation and proprioception WLF. Pt persists with decreased sustained muscular activation on L side.      See FIM for current functional status  Clydene Laming, PT, DPT  10/14/2013, 9:01 AM

## 2013-10-14 NOTE — Plan of Care (Signed)
Problem: RH Vision Goal: RH LTG Vision (Specify) Outcome: Not Met (add Reason) Requires occasional vcs

## 2013-10-14 NOTE — Progress Notes (Signed)
Speech Language Pathology Discharge Summary & Final Treatment Note  Patient Details  Name: Erika Wang MRN: [id number] Date of Birth: December 18, 1950  Today's Date: 10/14/2013 Time: 0915-1000 Time Calculation (min): 45 min  Skilled Therapeutic Intervention: Treatment focused on patient education, with no family or caregiver present. SLP facilitated session with education regarding current cognitive function and recommendations upon discharge. Pt continues to decline the need for supervision, reporting that she does not want to give someone else "the power." SLP provided additional encouragement and examples of need for supervision level assistance. Pt reviewed handouts regarding memory and problem solving strategies, identifying ones that she was already using PTA and identifying ways to apply new ones with supervision level verbal cues. All questions were answered.  Patient has met 4 of 4 long term goals.  Patient to discharge at overall Supervision level.  Reasons goals not met: N/A   Clinical Impression/Discharge Summary: Pt has met 4 out of 4 LTGs during this admission with improvements in swallowing and cognitive function. Pt is consuming regular textures and thin liquids with Mod I. She requires supervision level cueing for intellectual awareness, utilization of external memory aids, and complex problem solving. Pt aware of SLP recommendation for supervision upon discharge. Pt education has been completed, however family has not been present. Pt has met all LTGs and is scheduled to d/c home with her daughter, where it is recommended that she continue to receive brief HH SLP f/u to maximize functional independence.  Care Partner:     Type of Caregiver Assistance: Cognitive  Recommendation:  Home Health SLP;24 hour supervision/assistance  Rationale for SLP Follow Up: Maximize cognitive function and independence   Equipment: None recommended by SLP   Reasons for discharge: Treatment goals  met;Discharged from hospital   Patient/Family Agrees with Progress Made and Goals Achieved: Yes   See FIM for current functional status   Maxcine Ham, M.A. CCC-SLP [phone]   Maxcine Ham 10/14/2013, 3:28 PM

## 2013-10-14 NOTE — Progress Notes (Signed)
Social Work Patient ID: Erika Wang, female   DOB: 11-01-1950, 62 y.o.   MRN: [id number] Met with pt to discuss discharge tomorrow.  She plans to go to daughter's for a few days then back to her home. She will not have someone with her there.  She feels she will be fine once home, she is aware of the team's recommendations Of 24 hour supervision level.  Will make referral for home health to see while at daughter's home.  DME to be delivered to room today. Have not been able to get daughter in for education due to has new baby at home.

## 2013-10-14 NOTE — Progress Notes (Addendum)
Occupational Therapy Discharge Summary  Patient Details  Name: Erika Wang MRN: [id number] Date of Birth: 04/24/1951  Today's Date: 10/14/2013 Time: 1000-1100 Time Calculation (min): 60 min  Patient has met 12 of 13 long term goals due to improved activity tolerance, improved balance, functional use of  RIGHT upper extremity, improved attention, improved coordination and improved attention to left..  Patient to discharge at overall Supervision level.  Patient's care partner (daughter who has a 78 week old newborn) is unavailable to attend OT sessions for education related to the necessary physical (if LOB with dynamic balance primarily due to left inattention), cognitive and supervision assistance at discharge for BADL & IADL.  Patient is aware that 24/7 supervision is recommended after discharge.  Reasons goals not met: patient continues to require cues to attend to left  Recommendation:  Patient will benefit from ongoing skilled OT services in home health setting to continue to advance functional skills in the area of BADL, iADL and Reduce care partner burden.  Equipment: shower chair  Reasons for discharge: discharge from hospital  Patient/family agrees with progress made and goals achieved: Yes  Skilled Intervention: Self care retraining to include shower, dress and groom.  Focused session on increased use of LUE, safety with all functional mobility during BADL & IADL, attention to left, walker safety.  Patient gathered items for her shower, performed tub shower transfer with shower chair, dressed and groomed.  Patient performed at an overall supervision level with occasional cues for safety and to attend to left.  Walker bag issued to improve safety when need to transport items while ambulating.  OT Discharge Precautions/Restrictions  Precautions Precautions: Fall Precaution Comments: L-sided weakness, left visual inattention Restrictions Weight Bearing Restrictions:  No Pain Denies pain ADL  Refer to FIM for details, overall supervision Vision/Perception  Vision - History Baseline Vision: Wears glasses only for reading Patient Visual Report: No change from baseline Vision - Assessment Eye Alignment: Within Functional Limits Perception Perception: Impaired Inattention/Neglect: Does not attend to left visual field Praxis Praxis: Intact  Cognition Overall Cognitive Status: Impaired/Different from baseline Arousal/Alertness: Awake/alert Orientation Level: Oriented X4 Attention: Selective Selective Attention: Appears intact Memory: Impaired Memory Impairment: Decreased recall of new information Awareness: Impaired Awareness Impairment: Emergent impairment;Intellectual impairment Problem Solving: Impaired Problem Solving Impairment: Verbal complex;Functional complex Safety/Judgment: Impaired Sensation Sensation Light Touch: Appears Intact Proprioception: Appears Intact Coordination Gross Motor Movements are Fluid and Coordinated: No Fine Motor Movements are Fluid and Coordinated: No Coordination and Movement Description: Decreased speed, accuracy and rhythm with isolated finger movements Finger Nose Finger Test: impaired on L; decreased speed yet improved since eval Heel Shin Test: Slight decrease timing and excursion with L on R Motor  Motor Motor: Hemiplegia Motor - Skilled Clinical Observations: Weaker on L with decreased speed and excursion of timing for  movements Motor - Discharge Observations: Improved muscle activation since eval overall, but some weakness remains Mobility  Bed Mobility Bed Mobility: Supine to Sit;Sit to Supine Supine to Sit: 7: Independent Sit to Supine: 7: Independent  Transfers:  Supervision Trunk/Postural Assessment  Cervical Assessment Cervical Assessment: Within Functional Limits Thoracic Assessment Thoracic Assessment: Within Functional Limits Lumbar Assessment Lumbar Assessment: Within Functional  Limits  Balance Standardized Balance Assessment: Berg Balance Test (per PT) Sharlene Motts Balance Test Sit to Stand: Able to stand  independently using hands Standing Unsupported: Able to stand 2 minutes with supervision Sitting with Back Unsupported but Feet Supported on Floor or Stool: Able to sit safely and securely 2 minutes Stand to  Sit: Sits safely with minimal use of hands Transfers: Able to transfer safely, definite need of hands Standing Unsupported with Eyes Closed: Able to stand 10 seconds with supervision Standing Ubsupported with Feet Together: Able to place feet together independently and stand for 1 minute with supervision From Standing, Reach Forward with Outstretched Arm: Can reach confidently >25 cm (10") From Standing Position, Pick up Object from Floor: Able to pick up shoe safely and easily From Standing Position, Turn to Look Behind Over each Shoulder: Looks behind from both sides and weight shifts well Turn 360 Degrees: Able to turn 360 degrees safely one side only in 4 seconds or less Standing Unsupported, Alternately Place Feet on Step/Stool: Needs assistance to keep from falling or unable to try Standing Unsupported, One Foot in Front: Needs help to step but can hold 15 seconds Standing on One Leg: Able to lift leg independently and hold equal to or more than 3 seconds Total Score: 41 Static Sitting Balance Static Sitting - Balance Support: No upper extremity supported;Feet supported Static Sitting - Level of Assistance: 7: Independent Dynamic Sitting Balance Dynamic Sitting - Balance Support: Feet supported Dynamic Sitting - Level of Assistance: 7: Independent Static Standing Balance Static Standing - Balance Support: No upper extremity supported;During functional activity Static Standing - Level of Assistance: 5: Stand by assistance Extremity/Trunk Assessment RUE Assessment RUE Assessment: Within Functional Limits LUE Assessment LUE Assessment: Exceptions to [facility]  (impaired coordination) LUE AROM (degrees) LUE Overall AROM Comments: WFL LUE Strength LUE Overall Strength Comments: Grossly 4+/5-5/5, left grip improved yet not as strong as right  See FIM for current functional status  Erika Wang 10/14/2013, 4:41 PM

## 2013-10-14 NOTE — Progress Notes (Signed)
Social Work Patient ID: Erika Wang, female   DOB: [birth year], 62 y.o.   MRN: [id number] Met with pt regarding PCP she will work on once back in [facility] her home.  She has a neighbor she will talk with and set herself up with one. She wants to find out who neighbor see's.  Pt aware how important she be followed by a PCP.

## 2013-10-14 NOTE — Discharge Summary (Signed)
Discharge summary job [phone]

## 2013-10-15 ENCOUNTER — Ambulatory Visit (HOSPITAL_COMMUNITY): Payer: BC Managed Care – PPO

## 2013-10-15 MED ORDER — ASPIRIN 325 MG PO TBEC: 325.0000 mg | DELAYED_RELEASE_TABLET | Freq: Every day | ORAL | Status: DC

## 2013-10-15 MED ORDER — NICOTINE 7 MG/24HR TD PT24: MEDICATED_PATCH | TRANSDERMAL | Status: DC

## 2013-10-15 MED ORDER — ATENOLOL 25 MG PO TABS: 25.0000 mg | ORAL_TABLET | Freq: Every day | ORAL | Status: DC

## 2013-10-15 MED ORDER — ATORVASTATIN CALCIUM 10 MG PO TABS: 10.0000 mg | ORAL_TABLET | Freq: Every day | ORAL | Status: DC

## 2013-10-15 NOTE — Progress Notes (Signed)
Subjective/Complaints: Looking fwd to D/C today A 12 point review of systems has been performed and if not noted above is otherwise negative.   Objective: Vital Signs: Blood pressure 125/77, pulse 62, temperature 98.2 F (36.8 C), temperature source Oral, resp. rate 18, height 5' (1.524 m), weight 50.939 kg (112 lb 4.8 oz), SpO2 97.00%. No results found.  Recent Labs  10/13/13 0750  WBC 4.7  HGB 13.1  HCT 39.1  PLT 264   No results found for this basename: NA, K, CL, CO, GLUCOSE, BUN, CREATININE, CALCIUM,  in the last 72 hours CBG (last 3)  No results found for this basename: GLUCAP,  in the last 72 hours  Wt Readings from Last 3 Encounters:  10/08/13 50.939 kg (112 lb 4.8 oz)  10/01/13 54.885 kg (121 lb)    Physical Exam:  Constitutional: She is oriented to person, place, and time. She is frail, side lying in bed HENT:  Head: Normocephalic.  Poor dentition, mucosa moist  Eyes: EOM are normal.  Neck: Normal range of motion. Neck supple. No thyromegaly present.  Cardiovascular: Normal rate and regular rhythm. No murmur  Respiratory: Effort normal and breath sounds normal. No respiratory distress. No wheezes or rales  GI: Soft. Bowel sounds are normal. She exhibits no distension. No pain Neurological: She is alert and oriented to person, place, and time. HOH Follows commands. . Left facial weakness and tongue deviation. Speech  intelligible.Skin: Skin is warm and dry. Ecchymosis L arm and around IV site left forearm Psychiatric: She has a normal mood and affect. Her behavior is normal. Judgment and thought content normal   Assessment/Plan: 1. Functional deficits secondary to thrombotic right basal ganglia infarct  Stable for D/C today F/u PCP in 1-2 weeks F/u PM&R 3 weeks See D/C summary See D/C instructions FIM: FIM - Bathing Bathing Steps Patient Completed: Chest;Right Arm;Left Arm;Abdomen;Left upper leg;Right upper leg;Buttocks;Right lower leg (including  foot);Left lower leg (including foot);Front perineal area Bathing: 5: Supervision: Safety issues/verbal cues  FIM - Upper Body Dressing/Undressing Upper body dressing/undressing steps patient completed: Pull shirt over trunk;Thread/unthread left sleeve of pullover shirt/dress;Put head through opening of pull over shirt/dress Upper body dressing/undressing: 5: Supervision: Safety issues/verbal cues FIM - Lower Body Dressing/Undressing Lower body dressing/undressing steps patient completed: Thread/unthread right pants leg;Thread/unthread left pants leg;Pull pants up/down;Fasten/unfasten pants;Don/Doff right sock;Don/Doff left sock Lower body dressing/undressing: 5: Supervision: Safety issues/verbal cues  FIM - Toileting Toileting steps completed by patient: Adjust clothing prior to toileting;Performs perineal hygiene;Adjust clothing after toileting Toileting Assistive Devices: Grab bar or rail for support Toileting: 5: Supervision: Safety issues/verbal cues  FIM - Diplomatic Services operational officer Devices: Grab bars;Walker Toilet Transfers: 5-To toilet/BSC: Supervision (verbal cues/safety issues);5-From toilet/BSC: Supervision (verbal cues/safety issues)  FIM - Press photographer Assistive Devices: Arm rests;Walker Bed/Chair Transfer: 5: Bed > Chair or W/C: Supervision (verbal cues/safety issues);5: Chair or W/C > Bed: Supervision (verbal cues/safety issues)  FIM - Locomotion: Wheelchair Distance: 150 Locomotion: Wheelchair: 0: Activity did not occur FIM - Locomotion: Ambulation Locomotion: Ambulation Assistive Devices: Designer, industrial/product Ambulation/Gait Assistance: 5: Supervision Locomotion: Ambulation: 4: Travels 150 ft or more with minimal assistance (Pt.>75%)  Comprehension Comprehension Mode: Auditory Comprehension: 5-Understands complex 90% of the time/Cues < 10% of the time  Expression Expression Mode: Verbal Expression: 5-Expresses complex 90% of  the time/cues < 10% of the time  Social Interaction Social Interaction: 6-Interacts appropriately with others with medication or extra time (anti-anxiety, antidepressant).  Problem Solving Problem Solving: 6-Solves complex  problems: With extra time  Memory Memory: 5-Recognizes or recalls 90% of the time/requires cueing < 10% of the time   Medical Problem List and Plan:  1. Thrombotic right basal ganglia infarction  2. DVT Prophylaxis/Anticoagulation: SCDs. Follow clinically for now. 3. Pain Management: Tylenol as needed  4. Neuropsych: This patient is capable of making decisions on her own behalf.  5. Hypertension.Had acute renal failure with Lisinopril 10 mg daily. Systolic BP improved on BB 6. Tobacco abuse. NicoDerm patch. Provide counseling for this as well as secondary stroke prevention   7. Hyperlipidemia. Lipitor  8. FEN: encourage PO intake.    9.  .  Neutropenia, likely Bactrim WBC increasing to normal range   LOS (Days) 12 A FACE TO FACE EVALUATION WAS PERFORMED  Claudette Laws E 10/15/2013 7:14 AM

## 2013-10-15 NOTE — Discharge Summary (Signed)
NAMEGUSTA, MARKSBERRY                ACCOUNT NO.:  [id number]  MEDICAL RECORD NO.:  [id number]  LOCATION:  4M03C                        FACILITY:  [facility]  PHYSICIAN:  Erick Colace, M.D.DATE OF BIRTH:  07/03/1951  DATE OF ADMISSION:  10/03/2013 DATE OF DISCHARGE:  10/15/2013                              DISCHARGE SUMMARY   DISCHARGE DIAGNOSES: 1. Thrombotic right basal ganglia infarction. 2. Sequential compression devices for deep vein thrombosis     prophylaxis. 3. Hypertension. 4. Tobacco abuse. 5. Gram-negative rod urinary tract infection, resolved. 6. Hyperlipidemia.  HISTORY OF PRESENT ILLNESS:  This is a 62 year old right-handed female with history of tobacco abuse, on no scheduled medications prior to admission, who presented on September 28, 2013, with left-sided weakness. The patient was independent prior to admission, living alone in [place], [place], and was visiting her daughter in [place].  MRI showed right basal ganglia infarction.  MRA of the head with anterior circulation negative except for some mild irregularity compatible with atherosclerosis.  Echocardiogram with ejection fraction of 70%.  No wall motion abnormalities.  Carotid Dopplers with no ICA stenosis.  Neurology Services consulted.  The patient did receive t-PA.  Placed on aspirin for CVA prophylaxis.  She was on a regular diet.  Blood pressures monitored.  A NicoDerm patch was added for tobacco abuse.  Placed on Bactrim for greater than 100,000 Gram-negative rod urinary tract infection.  Physical and occupational therapy ongoing.  The patient was admitted for comprehensive rehab program.  PAST MEDICAL HISTORY:  See discharge diagnoses.  SOCIAL HISTORY:  Lives alone.  FUNCTIONAL HISTORY:  Prior to admission, independent, working full time. Functional status upon admission to rehab services, +2 total assist, ambulated 100 feet, two person handheld assistance.  PHYSICAL EXAMINATION:   VITAL SIGNS:  Blood pressure 161/82, pulse 76, temperature 99, respirations 18. GENERAL:  This was an alert female, oriented x3.  She followed simple commands.  She had a fair insight into her deficits.  Left facial weakness and tongue deviation.  Speech was mildly slurred, but intelligible. LUNGS:  Clear to auscultation. CARDIAC:  Regular rate and rhythm. ABDOMEN:  Soft, nontender.  Good bowel sounds.  REHABILITATION HOSPITAL COURSE:  The patient was admitted to the inpatient rehab services with therapies initiated on a 3-hour daily basis, consisting of physical therapy, occupational therapy, speech therapy, and rehabilitation nursing.  The following issues were addressed during the patient's rehabilitation stay.  Pertaining to Mrs. Carreon's thrombotic right basal ganglia infarction, remained on aspirin therapy.  She would follow up with Neurology Services.  Sequential compression devices for DVT prophylaxis.  Blood pressures monitored closely and controlled with Tenormin.  She did have a history of tobacco abuse.  She was placed on a NicoDerm patch, received full counseling in regard to cessation of nicotine products.  It was questionable if she would be compliant with these request.  She completed a course of Bactrim for Gram-negative rod urinary tract infection with no hematuria or dysuria noted.  She remained on Lipitor for hyperlipidemia.  The patient received weekly collaborative interdisciplinary team conferences to discuss estimated length of stay, family teaching, and any barriers to her discharge.  The patient is  ambulating with a rolling walker in a controlled environment with supervision, some cuing for left foot clearance.  She could step over obstacles with supervision, moderate cuing for safety and sequencing.  Side stepping with supervision and cues to take larger steps of left lower extremity.  Stair negotiation with two hand rails close supervision.  The patient was  independent with dressing and bathing.  Monitoring for safety.  She could utilize her left upper extremity to wash and cut vegetables in the kitchen.  She was able to negotiate about the room with a rolling walker.  Full family teaching was completed.  Plan was to be discharged home with family assistance.  DISCHARGE MEDICATIONS: 1. Aspirin 325 mg p.o. daily. 2. Tenormin 25 mg p.o. daily. 3. Lipitor 10 mg p.o. daily. 4. NicoDerm patch 7 mg daily x3 weeks and stop.  DIET:  Regular.  SPECIAL INSTRUCTIONS:  The patient would follow up with Dr. Claudette Laws at the outpatient rehab center as directed, Dr. Delia Heady, Neurology Services, in 1 month, call for appointment.  Social worker to arrange medical management.  Ongoing therapies were dictated as per [employer].     Mariam Dollar, P.A.   ______________________________ Erick Colace, M.D.    DA/MEDQ  D:  10/14/2013  T:  10/15/2013  Job:  [id number]  cc:   Pramod P. Pearlean Brownie, MD

## 2013-10-15 NOTE — Progress Notes (Signed)
Social Work Discharge Note Discharge Note  The overall goal for the admission was met for:   Discharge location: Yes-HOME WITH DAUGHTER THEN PT PLANS TO RETURN TO [place]-ALONE  Length of Stay: Yes-12 DAYS  Discharge activity level: Yes-SUPERVISION LEVEL  Home/community participation: Yes  Services provided included: MD, RD, PT, OT, SLP, RN, Pharmacy and SW  Financial Services: Private Insurance: [employer] [place]  Follow-up services arranged: Home Health: [facility]-PT,OT,SPT,SW, DME: [facility]-ROLLING WALKER,TUB SEAT and Patient/Family has no preference for HH/DME agencies  Comments (or additional information):DAUGHTER COMPLETED FAMILY EDUCATION DAY OF D/C. PT PLANS TO OBTAIN OWN PCP. PLANS TO GO HOME ALONE AND FEELS WILL MANAGE. BOTH AWARE OF TEAM'S RECOMMENDATIONS OF 24 HR SUPERVISION LEVEL. PT IS COMPETENT AND ABLE TO MAKE HER OWN DECISIONS EVEN IF NOT BEST JUDGEMENT.  Patient/Family verbalized understanding of follow-up arrangements: Yes  Individual responsible for coordination of the follow-up plan: SELF & EBONY-DAUGHTER  Confirmed correct DME delivered: Lucy Chris 10/15/2013    Lucy Chris

## 2013-10-15 NOTE — Progress Notes (Signed)
Patient discharged about 19 with family to home. Harvel Ricks PA gave patient all discharge instructions. Patient discharged with all belongings. Patient and family denied any questions about discharge instructions. Vitals stable.

## 2013-10-15 NOTE — Progress Notes (Signed)
Physical Therapy Note  Patient Details  Name: Erika Wang MRN: [id number] Date of Birth: May 03, 1951 Today's Date: 10/15/2013 1130-1220 50 min individual therapy  tx focused on family ed for basic and floor transfers, gait in home and controlled settings, up/down curb/ramp and 4 steps.  Daughter here for training; she return demonstrated understanding of all of above.  Pt required supervision for all, due to frequent lack of awareness of safety issues, L inattention, decreased short term memory.  Daughter is aware of all of these deficits, and that 24/7 supervision is recommended.  She stated pt insists she will go home alone by weekend.  Daughter issued hand out for Fall Prevention I exs using 2# weights for bil ankles for knee ext and hip abduction.  Erika Wang 10/15/2013, 12:54 PM

## 2013-11-10 ENCOUNTER — Inpatient Hospital Stay: Payer: BC Managed Care – PPO | Admitting: Physical Medicine & Rehabilitation

## 2013-11-19 ENCOUNTER — Telehealth: Payer: Self-pay

## 2013-11-19 NOTE — Telephone Encounter (Signed)
Patient was prescribed a medication on 11/18/13 and she would like someone to give her a call. She wants to make sure it will not interfere with the medication she already takes.

## 2013-11-20 NOTE — Telephone Encounter (Signed)
Left message for patient returning her call regarding her medications.

## 2015-07-09 ENCOUNTER — Ambulatory Visit: Payer: Self-pay

## 2017-09-01 ENCOUNTER — Encounter (HOSPITAL_COMMUNITY): Payer: Self-pay | Admitting: Emergency Medicine

## 2017-09-01 ENCOUNTER — Emergency Department (HOSPITAL_COMMUNITY): Payer: Medicaid Other

## 2017-09-01 ENCOUNTER — Emergency Department (HOSPITAL_COMMUNITY)
Admission: EM | Admit: 2017-09-01 | Discharge: 2017-09-01 | Disposition: A | Discharge status: Home / Self Care | Payer: Medicaid Other | Attending: Emergency Medicine | Admitting: Emergency Medicine

## 2017-09-01 DIAGNOSIS — N390 Urinary tract infection, site not specified: Secondary | ICD-10-CM | POA: Insufficient documentation

## 2017-09-01 DIAGNOSIS — I10 Essential (primary) hypertension: Secondary | ICD-10-CM | POA: Insufficient documentation

## 2017-09-01 DIAGNOSIS — E7800 Pure hypercholesterolemia, unspecified: Secondary | ICD-10-CM | POA: Diagnosis not present

## 2017-09-01 DIAGNOSIS — Z79899 Other long term (current) drug therapy: Secondary | ICD-10-CM | POA: Insufficient documentation

## 2017-09-01 DIAGNOSIS — Z8673 Personal history of transient ischemic attack (TIA), and cerebral infarction without residual deficits: Secondary | ICD-10-CM | POA: Insufficient documentation

## 2017-09-01 DIAGNOSIS — F17200 Nicotine dependence, unspecified, uncomplicated: Secondary | ICD-10-CM | POA: Diagnosis not present

## 2017-09-01 DIAGNOSIS — Z7982 Long term (current) use of aspirin: Secondary | ICD-10-CM | POA: Diagnosis not present

## 2017-09-01 DIAGNOSIS — R4182 Altered mental status, unspecified: Secondary | ICD-10-CM | POA: Diagnosis present

## 2017-09-01 HISTORY — DX: Pure hypercholesterolemia, unspecified: E78.00

## 2017-09-01 HISTORY — DX: Essential (primary) hypertension: I10

## 2017-09-01 HISTORY — DX: Cerebral infarction, unspecified: I63.9

## 2017-09-01 LAB — COMPREHENSIVE METABOLIC PANEL
ALK PHOS: 78 U/L (ref 38–126)
ALT: 25 U/L (ref 14–54)
AST: 28 U/L (ref 15–41)
Albumin: 3.9 g/dL (ref 3.5–5.0)
Anion gap: 12 (ref 5–15)
BUN: 13 mg/dL (ref 6–20)
CALCIUM: 10.1 mg/dL (ref 8.9–10.3)
CO2: 26 mmol/L (ref 22–32)
CREATININE: 1.21 mg/dL — AB (ref 0.44–1.00)
Chloride: 100 mmol/L — ABNORMAL LOW (ref 101–111)
GFR calc non Af Amer: 46 mL/min — ABNORMAL LOW (ref >60)
GFR, EST AFRICAN AMERICAN: 54 mL/min — AB (ref >60)
Glucose, Bld: 118 mg/dL — ABNORMAL HIGH (ref 65–99)
Potassium: 3.4 mmol/L — ABNORMAL LOW (ref 3.5–5.1)
Sodium: 138 mmol/L (ref 135–145)
Total Bilirubin: 0.7 mg/dL (ref 0.3–1.2)
Total Protein: 8.6 g/dL — ABNORMAL HIGH (ref 6.5–8.1)

## 2017-09-01 LAB — CBC WITH DIFFERENTIAL/PLATELET
BASOS ABS: 0 10*3/uL (ref 0.0–0.1)
BASOS PCT: 0 %
EOS ABS: 0.1 10*3/uL (ref 0.0–0.7)
EOS PCT: 1 %
HCT: 35.3 % — ABNORMAL LOW (ref 36.0–46.0)
HEMOGLOBIN: 12 g/dL (ref 12.0–15.0)
Lymphocytes Relative: 42 %
Lymphs Abs: 1.9 10*3/uL (ref 0.7–4.0)
MCH: 30 pg (ref 26.0–34.0)
MCHC: 34 g/dL (ref 30.0–36.0)
MCV: 88.3 fL (ref 78.0–100.0)
Monocytes Absolute: 0.7 10*3/uL (ref 0.1–1.0)
Monocytes Relative: 16 %
NEUTROS ABS: 1.8 10*3/uL (ref 1.7–7.7)
Neutrophils Relative %: 41 %
PLATELETS: 253 10*3/uL (ref 150–400)
RBC: 4 MIL/uL (ref 3.87–5.11)
RDW: 14.6 % (ref 11.5–15.5)
WBC: 4.5 10*3/uL (ref 4.0–10.5)

## 2017-09-01 LAB — URINALYSIS, ROUTINE W REFLEX MICROSCOPIC
Glucose, UA: NEGATIVE mg/dL
Hgb urine dipstick: NEGATIVE
KETONES UR: NEGATIVE mg/dL
Nitrite: NEGATIVE
Protein, ur: 100 mg/dL — AB
Specific Gravity, Urine: 1.025 (ref 1.005–1.030)
pH: 5 (ref 5.0–8.0)

## 2017-09-01 LAB — AMMONIA: Ammonia: 21 umol/L (ref 9–35)

## 2017-09-01 LAB — RAPID URINE DRUG SCREEN, HOSP PERFORMED
Amphetamines: NOT DETECTED
BENZODIAZEPINES: NOT DETECTED
Barbiturates: NOT DETECTED
COCAINE: NOT DETECTED
Opiates: NOT DETECTED
TETRAHYDROCANNABINOL: NOT DETECTED

## 2017-09-01 LAB — SALICYLATE LEVEL: Salicylate Lvl: <7 mg/dL (ref 2.8–30.0)

## 2017-09-01 LAB — ETHANOL: Alcohol, Ethyl (B): <10 mg/dL (ref <10)

## 2017-09-01 LAB — ACETAMINOPHEN LEVEL: Acetaminophen (Tylenol), Serum: <10 ug/mL — ABNORMAL LOW (ref 10–30)

## 2017-09-01 MED ORDER — DILTIAZEM HCL ER COATED BEADS 180 MG PO CP24
180.0000 mg | ORAL_CAPSULE | Freq: Two times a day (BID) | ORAL | Status: DC
  Administered 2017-09-01: 180 mg via ORAL
  Filled 2017-09-01: qty 1

## 2017-09-01 MED ORDER — LORAZEPAM 1 MG PO TABS: 1.0000 mg | ORAL_TABLET | Freq: Once | ORAL | Status: DC

## 2017-09-01 MED ORDER — ACETAMINOPHEN 500 MG PO TABS: 500.0000 mg | ORAL_TABLET | Freq: Four times a day (QID) | ORAL | Status: DC | PRN

## 2017-09-01 MED ORDER — ATENOLOL 25 MG PO TABS
25.0000 mg | ORAL_TABLET | Freq: Every day | ORAL | Status: DC
  Filled 2017-09-01 (×2): qty 1

## 2017-09-01 MED ORDER — ASPIRIN EC 325 MG PO TBEC
325.0000 mg | DELAYED_RELEASE_TABLET | Freq: Every day | ORAL | Status: DC
  Administered 2017-09-01: 325 mg via ORAL
  Filled 2017-09-01 (×2): qty 1

## 2017-09-01 MED ORDER — LORAZEPAM 1 MG PO TABS: 1.0000 mg | ORAL_TABLET | ORAL | Status: DC | PRN

## 2017-09-01 MED ORDER — CEPHALEXIN 500 MG PO CAPS
500.0000 mg | ORAL_CAPSULE | Freq: Three times a day (TID) | ORAL | Status: DC
  Administered 2017-09-01: 500 mg via ORAL
  Filled 2017-09-01: qty 1

## 2017-09-01 MED ORDER — CEPHALEXIN 500 MG PO CAPS: 500.0000 mg | ORAL_CAPSULE | Freq: Three times a day (TID) | ORAL | 0 refills | Status: DC

## 2017-09-01 MED ORDER — ATORVASTATIN CALCIUM 10 MG PO TABS: 10.0000 mg | ORAL_TABLET | Freq: Every day | ORAL | Status: DC

## 2017-09-01 MED ORDER — HYDROCHLOROTHIAZIDE 25 MG PO TABS
25.0000 mg | ORAL_TABLET | Freq: Every day | ORAL | Status: DC
  Administered 2017-09-01: 25 mg via ORAL
  Filled 2017-09-01: qty 1

## 2017-09-01 NOTE — BH Assessment (Addendum)
Assessment Note  Erika Wang is an 66 y.o. female who presents to the ED voluntarily accompanied by her daughter, Erika Wang. Pt provided verbal consent for her daughter to be present during the assessment and to receive information regarding her plan of care while in the ED. Per chart, pt was BIB EMS due to wandering around her yard naked. During the assessment, the pt was speaking incoherently and not completing her thoughts. Pt often speaking about nonsensical topics and continues to point behind this Clinical research associate. Pt stated she came to the ED from [place] and stated she was in a hospital before she came to the ED. Pt did have a stroke about 4 years ago and the pt's daughter stated that is when her behavior changed. Pt's daughter reports the pt has been getting progressively worse and her awareness has declined. Pt's daughter states the pt is often forgetful, irritable, hateful towards men, accusatory, and rambles in incoherent speech patterns. Pt's daughter states the pt was adopted and she never knew her birth parents therefore she is unaware of their mental health history as it relates to a family history of Dementia. Pt's daughter states the pt has not been taking care of her personal hygiene. Pt was asked if she ever experiences AVH and pt stated she sees spirits and talks to [facility] and has breakfast with him. Pt then began speaking about colors including purple and yellow. Pt's daughter states the pt told her one day that she saw a pink shirt floating around the room. Pt's daughter states she does not have legal guardianship at this time but she is going to inquire about establishing guardianship. Pt's daughter is tearful during the assessment and states she feels overwhelmed because this is new for the pt. Pt states she understands what is stated by the writer in regards to her mental health and the importance of receiving treatment, however pt often stares blindly and begins rambling about nonsensical  topics.   TTS consulted with Nira Conn, NP who recommends gero-psych treatment. EDP Charlynne Pander, MD notified and in agreement with disposition. Pt's nurse Hedgecock, Barkley Bruns, RN aware of disposition. TTS to seek placement.   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 High cholesterol   . Hypertension   . Stroke [facility])     Past Surgical History:  Procedure Laterality Date  . CESAREAN SECTION      Family History: No family history on file.  Social History:  reports that she has been smoking.  She does not have any smokeless tobacco history on file. Her alcohol and drug histories are not on file.  Additional Social History:  Alcohol / Drug Use Pain Medications: See MAR Prescriptions: See MAR Over the Counter: See MAR History of alcohol / drug use?: No history of alcohol / drug abuse  CIWA: CIWA-Ar BP: (!) 152/94 Pulse Rate: 89 COWS:    Allergies:  Allergies  Allergen Reactions  . Ace Inhibitors Other (See Comments)    Acute renal failure    Home Medications:  (Not in a hospital admission)  OB/GYN Status:  No LMP recorded. Patient is postmenopausal.  General Assessment Data Location of Assessment: [facility] ED TTS Assessment: In system Is this a Tele or Face-to-Face Assessment?: Face-to-Face Is this an Initial Assessment or a Re-assessment for this encounter?: Initial Assessment Marital status: Single Is patient pregnant?: No Pregnancy Status: No Living  Arrangements: Alone Can pt return to current living arrangement?: Yes Admission Status: Voluntary Is patient capable of signing voluntary admission?: Yes Referral Source: Self/Family/Friend Insurance type: none     Crisis Care Plan Living Arrangements: Alone Name of Psychiatrist: none Name of Therapist: none  Education Status Is patient currently in school?: No Highest grade of  school patient has completed: 12th  Risk to self with the past 6 months Suicidal Ideation: No Has patient been a risk to self within the past 6 months prior to admission? : No Suicidal Intent: No Has patient had any suicidal intent within the past 6 months prior to admission? : No Is patient at risk for suicide?: No Suicidal Plan?: No Has patient had any suicidal plan within the past 6 months prior to admission? : No Access to Means: No What has been your use of drugs/alcohol within the last 12 months?: denies Previous Attempts/Gestures: No Triggers for Past Attempts: None known Intentional Self Injurious Behavior: None Family Suicide History: No Recent stressful life event(s): Recent negative physical changes (stroke 4 years ago) Persecutory voices/beliefs?: No Depression: No Substance abuse history and/or treatment for substance abuse?: No Suicide prevention information given to non-admitted patients: Not applicable  Risk to Others within the past 6 months Homicidal Ideation: No Does patient have any lifetime risk of violence toward others beyond the six months prior to admission? : No Thoughts of Harm to Others: No Current Homicidal Intent: No Current Homicidal Plan: No Access to Homicidal Means: No History of harm to others?: No Assessment of Violence: None Noted Does patient have access to weapons?: Yes (Comment) (knives) Criminal Charges Pending?: No Does patient have a Wang date: No Is patient on probation?: No  Psychosis Hallucinations: Auditory, Visual Delusions: Unspecified  Mental Status Report Appearance/Hygiene: Disheveled Eye Contact: Good Motor Activity: Unsteady Speech: Tangential, Slow, Slurred, Soft, Incoherent Level of Consciousness: Quiet/awake Mood: Labile, Anxious Affect: Anxious, Inconsistent with thought content Anxiety Level: Minimal Thought Processes: Tangential, Irrelevant, Thought Blocking Judgement: Impaired Orientation:  Person Obsessive Compulsive Thoughts/Behaviors: None  Cognitive Functioning Concentration: Poor Memory: Recent Impaired, Remote Impaired IQ: Average Insight: Poor Impulse Control: Poor Appetite: Poor Sleep: No Change Total Hours of Sleep: 8 Vegetative Symptoms: Decreased grooming  ADLScreening [facility] Assessment Services) Patient's cognitive ability adequate to safely complete daily activities?: Yes Patient able to express need for assistance with ADLs?: Yes Independently performs ADLs?: Yes (appropriate for developmental age)  Prior Inpatient Therapy Prior Inpatient Therapy: No  Prior Outpatient Therapy Prior Outpatient Therapy: No Does patient have an ACCT team?: No Does patient have Intensive In-House Services?  : No Does patient have [facility] services? : No Does patient have P4CC services?: No  ADL Screening (condition at time of admission) Patient's cognitive ability adequate to safely complete daily activities?: Yes Is the patient deaf or have difficulty hearing?: Yes Does the patient have difficulty seeing, even when wearing glasses/contacts?: No Does the patient have difficulty concentrating, remembering, or making decisions?: Yes Patient able to express need for assistance with ADLs?: Yes Does the patient have difficulty dressing or bathing?: No Independently performs ADLs?: Yes (appropriate for developmental age) Does the patient have difficulty walking or climbing stairs?: Yes Weakness of Legs: Both Weakness of Arms/Hands: None  Home Assistive Devices/Equipment Home Assistive Devices/Equipment: None    Abuse/Neglect Assessment (Assessment to be complete while patient is alone) Physical Abuse: Denies Verbal Abuse: Denies Sexual Abuse: Denies Exploitation of patient/patient's resources: Denies Self-Neglect: Denies     Merchant navy officer (For Healthcare) Does Patient Have  a Medical Advance Directive?: No Would patient like information on creating a medical  advance directive?: No - Patient declined    Additional Information 1:1 In Past 12 Months?: No CIRT Risk: No Elopement Risk: No Does patient have medical clearance?:  (pending)     Disposition:  Disposition Initial Assessment Completed for this Encounter: Yes Disposition of Patient: Inpatient treatment program Type of inpatient treatment program: Adult (gero-psych treatment per Nira ConnJason Berry, NP )  On Site Evaluation by:   Reviewed with Physician:    Karolee OhsAquicha R Atilano Covelli 09/01/2017 3:59 AM

## 2017-09-01 NOTE — [facility] Suicide Risk Assessment (Signed)
Suicide Risk Assessment  Discharge Assessment   [facility]   Principal Problem: Infection of urinary tract Discharge Diagnoses:  Patient Active Problem List   Diagnosis Date Noted  . Infection of urinary tract [N39.0] 10/02/2013    Priority: High  . Acute renal failure (HCC) [N17.9] 10/07/2013  . CVA (cerebral infarction) [I63.9] 10/03/2013  . Cigar smoker motivated to quit [F17.290] 10/02/2013  . Basal ganglia infarction (HCC) [I63.9] 10/02/2013  . Left hemiparesis (HCC) [G81.94] 10/02/2013  . Encounter for long-term (current) use of medications [Z79.899] 10/02/2013  . Stroke [facility](HCC) [I63.9] 09/28/2013    Total Time spent with patient: 45 minutes  Musculoskeletal: Strength & Muscle Tone: within normal limits Gait & Station: normal Patient leans: N/A  Psychiatric Specialty Exam:   Blood pressure (!) 166/94, pulse 80, temperature (!) 97.5 F (36.4 C), temperature source Oral, resp. rate 15, SpO2 100 %.There is no height or weight on file to calculate BMI.  General Appearance: Casual  Eye Contact::  Good  Speech:  Normal Rate409  Volume:  Normal  Mood:  Euthymic  Affect:  Congruent  Thought Process:  Coherent and Descriptions of Associations: Intact  Orientation:  Full (Time, Place, and Person)  Thought Content:  WDL and Logical  Suicidal Thoughts:  No  Homicidal Thoughts:  No  Memory:  Immediate;   Good Recent;   Good Remote;   Good  Judgement:  Fair  Insight:  Fair  Psychomotor Activity:  Normal  Concentration:  Good  Recall:  Good  Fund of Knowledge:Good  Language: Good  Akathisia:  No  Handed:  Right  AIMS (if indicated):     Assets:  Housing Leisure Time Physical Health Resilience Social Support  Sleep:     Cognition: WNL  ADL's:  Intact   Mental Status Per Nursing Assessment::   On Admission:   66 yo female who presented to the ED with confusion and some paranoia, recent onset.  She was diagnosed with UTI.  Today, she is  clear and coherent with no suicidal/homicidal ideations, hallucinations, or substance abuse.  Reports feeling safe to return home.  Stable for discharge.  Demographic Factors:  Living alone  Loss Factors: NA  Historical Factors: NA  Risk Reduction Factors:   Sense of responsibility to family and Positive social support  Continued Clinical Symptoms:  None   Cognitive Features That Contribute To Risk:  None    Suicide Risk:  Minimal: No identifiable suicidal ideation.  Patients presenting with no risk factors but with morbid ruminations; may be classified as minimal risk based on the severity of the depressive symptoms    Plan Of Care/Follow-up recommendations:  Activity:  as tolerated Diet:  heart healthy diet  LORD, JAMISON, NP 09/01/2017, 10:47 AM

## 2017-09-01 NOTE — ED Provider Notes (Signed)
[facility]-EMERGENCY DEPT Provider Note   CSN: [id number] Arrival date & time: 09/01/17  0056     History   Chief Complaint Chief Complaint  Patient presents with  . Medical Clearance    HPI Erika Wang is a 66 y.o. female history of previous stroke, here presenting with altered mental status, confusion.  Patient was found outside his house completely naked wandering around.  Patient cannot tell me why she was at the neighbor's house.  Patient denies any alcohol use and states that she feels okay.  She has no previous psychiatric history.  Patient was admitted in 2014 for basal ganglia stroke had weakness at that time but denies any weakness or trouble speaking currently.  The history is provided by the patient.    Past Medical History:  Diagnosis Date  . High cholesterol   . Hypertension   . Stroke [facility])     Patient Active Problem List   Diagnosis Date Noted  . Acute renal failure (HCC) 10/07/2013  . CVA (cerebral infarction) 10/03/2013  . Infection of urinary tract 10/02/2013  . Cigar smoker motivated to quit 10/02/2013  . 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 Left hemiparesis (HCC) 10/02/2013  . Encounter for long-term (current) use of medications 10/02/2013  . Stroke [facility]) 09/28/2013    Past Surgical History:  Procedure Laterality Date  . CESAREAN SECTION      OB History    No data available       Home Medications    Prior to Admission medications   Medication Sig Start Date End Date Taking? Authorizing Provider  aspirin EC 325 MG EC tablet Take 1 tablet (325 mg total) by mouth daily. 10/15/13   Angiulli, Mcarthur Rossetti, PA-C  atenolol (TENORMIN) 25 MG tablet Take 1 tablet (25 mg total) by mouth daily. 10/15/13   Angiulli, Mcarthur Rossetti, PA-C  atorvastatin (LIPITOR) 10 MG tablet Take 1 tablet (10 mg total) by mouth daily at 6 PM. 10/15/13   Angiulli, Mcarthur Rossetti, PA-C  nicotine (NICODERM CQ - DOSED IN MG/24 HR) 7 mg/24hr patch Continue  patch daily for 3 weeks and stop 10/15/13   Angiulli, Mcarthur Rossetti, PA-C    Family History No family history on file.  Social History Social History  Substance Use Topics  . Smoking status: Current Every Day Smoker  . Smokeless tobacco: Not on file  . Alcohol use Not on file     Allergies   Ace inhibitors   Review of Systems Review of Systems  Psychiatric/Behavioral: Positive for confusion.  All other systems reviewed and are negative.    Physical Exam Updated Vital Signs BP (!) 152/94   Pulse 89   Temp (!) 97.5 F (36.4 C) (Oral)   Resp 16   SpO2 100%   Physical Exam  Constitutional:  Confused   HENT:  Head: Normocephalic and atraumatic.  Mouth/Throat: Oropharynx is clear and moist.  Eyes: Pupils are equal, round, and reactive to light. Conjunctivae and EOM are normal.  Neck: Normal range of motion. Neck supple.  Cardiovascular: Normal rate, regular rhythm and normal heart sounds.   Pulmonary/Chest: Effort normal and breath sounds normal. No respiratory distress. She has no wheezes. She has no rales.  Abdominal: Soft. Bowel sounds are normal. She exhibits no distension. There is no tenderness. There is no guarding.  Musculoskeletal: Normal range of motion.  Neurological:  Confused, CN 2-12 intact. Nl strength throughout. Nl gait   Skin: Skin is warm.  Psychiatric:  Unable  Nursing note and vitals reviewed.    ED Treatments / Results  Labs (all labs ordered are listed, but only abnormal results are displayed) Labs Reviewed  CBC WITH DIFFERENTIAL/PLATELET - Abnormal; Notable for the following:       Result Value   HCT 35.3 (*)    All other components within normal limits  COMPREHENSIVE METABOLIC PANEL - Abnormal; Notable for the following:    Potassium 3.4 (*)    Chloride 100 (*)    Glucose, Bld 118 (*)    Creatinine, Ser 1.21 (*)    Total Protein 8.6 (*)    GFR calc non Af Amer 46 (*)    GFR calc Af Amer 54 (*)    All other components within normal  limits  ACETAMINOPHEN LEVEL - Abnormal; Notable for the following:    Acetaminophen (Tylenol), Serum <10 (*)    All other components within normal limits  ETHANOL  SALICYLATE LEVEL  AMMONIA  RAPID URINE DRUG SCREEN, HOSP PERFORMED  URINALYSIS, ROUTINE W REFLEX MICROSCOPIC    EKG  EKG Interpretation None       Radiology Ct Head Wo Contrast  Result Date: 09/01/2017 CLINICAL DATA:  66 year old female with altered mental status. EXAM: CT HEAD WITHOUT CONTRAST TECHNIQUE: Contiguous axial images were obtained from the base of the skull through the vertex without intravenous contrast. COMPARISON:  Head CT dated 09/29/2013 FINDINGS: Brain: There is central more than peripheral volume loss. Moderate deep white matter hypodensity consistent with chronic microvascular ischemic changes. An area of old infarct and encephalomalacia noted in the right basal ganglia. There is no acute intracranial hemorrhage. No mass effect or midline shift noted. No extra-axial fluid collection. Vascular: High attenuation of the cerebral vasculature, likely related to hemoconcentration. Clinical correlation is recommended. Skull: Normal. Negative for fracture or focal lesion. Sinuses/Orbits: No acute finding. Other: None IMPRESSION: 1. No acute intracranial hemorrhage. 2. Moderate chronic microvascular ischemic changes as well as an avulsed infarct in the right basal ganglia. If symptoms persist, and there are no contraindications, MRI may provide better evaluation if clinically indicated. Electronically Signed   By: Elgie CollardArash  Radparvar M.D.   On: 09/01/2017 03:19    Procedures Procedures (including critical care time)  Medications Ordered in ED Medications  LORazepam (ATIVAN) tablet 1 mg (not administered)     Initial Impression / Assessment and Plan / ED Course  I have reviewed the triage vital signs and the nursing notes.  Pertinent labs & imaging results that were available during my care of the patient were  reviewed by me and considered in my medical decision making (see chart for details).     Erika Wang is a 66 y.o. female here with confusion. Previous hx of stroke but has no weakness or slurred speech currently. Nonfocal neuro exam. Not sure why she is confused. Will check labs, UA, CT head, tox.   3:51 AM Labs at baseline. CT head unremarkable. TTS recommend geri psych placement. Will put in holding orders. Medically cleared.   Final Clinical Impressions(s) / ED Diagnoses   Final diagnoses:  None    New Prescriptions New Prescriptions   No medications on file     Charlynne PanderYao, David Hsienta, MD 09/01/17 [phone]

## 2017-09-01 NOTE — ED Notes (Signed)
Bed: ON62WA26 Expected date:  Expected time:  Means of arrival:  Comments: Hold for 17

## 2017-09-01 NOTE — BH Assessment (Signed)
[facility] Assessment Progress Note  TTS completed assessment and consulted with Nira ConnJason Berry, NP who recommends gero-psych treatment. EDP Charlynne PanderYao, David Hsienta, MD notified and in agreement with disposition. Pt's nurse Hedgecock, Barkley BrunsStephanie L, RN aware of disposition. TTS to seek placement.   Princess BruinsAquicha Fanny Agan, MSW, LCSW Therapeutic Triage Specialist  [phone]

## 2017-09-01 NOTE — ED Notes (Signed)
Requested Dr Madilyn Hookees to see pt and advise pt family about urine results

## 2017-09-01 NOTE — ED Triage Notes (Signed)
Pt brought in by EMS from home  Pt was found naked outside by her neighbor  Pt was clothed by the time [employer] arrived  Pt incoherent, will answer some questions appropriately but then will ramble in conversation  Denies alcohol or drug use ETOH bottles on scene

## 2017-09-01 NOTE — ED Provider Notes (Signed)
Pt here with confusion, change in behavior from baseline, found wandering outside her home.  Daughter states that confusion is very different from her baseline.  UA equivocal for UTI - pt reports occasional dysuria, will treat for possible UTI given her sxs.  Psych recommendations appreciated.     Erika Wang, Erika Seaborn, MD 09/01/17 [phone]

## 2017-09-01 NOTE — ED Notes (Signed)
Pt attempted to urinate but was unsuccessful.  

## 2018-01-27 ENCOUNTER — Encounter (HOSPITAL_COMMUNITY): Payer: Self-pay | Admitting: Emergency Medicine

## 2018-01-27 ENCOUNTER — Emergency Department (HOSPITAL_COMMUNITY): Payer: Medicare HMO

## 2018-01-27 ENCOUNTER — Other Ambulatory Visit: Payer: Self-pay

## 2018-01-27 ENCOUNTER — Emergency Department (HOSPITAL_COMMUNITY)
Admission: EM | Admit: 2018-01-27 | Discharge: 2018-01-29 | Disposition: A | Discharge status: Other Acute Inpatient Hospital | Payer: Medicare HMO | Attending: Emergency Medicine | Admitting: Emergency Medicine

## 2018-01-27 DIAGNOSIS — Z8673 Personal history of transient ischemic attack (TIA), and cerebral infarction without residual deficits: Secondary | ICD-10-CM | POA: Diagnosis not present

## 2018-01-27 DIAGNOSIS — R42 Dizziness and giddiness: Secondary | ICD-10-CM | POA: Diagnosis not present

## 2018-01-27 DIAGNOSIS — F22 Delusional disorders: Secondary | ICD-10-CM

## 2018-01-27 DIAGNOSIS — F23 Brief psychotic disorder: Secondary | ICD-10-CM | POA: Diagnosis not present

## 2018-01-27 DIAGNOSIS — Y92009 Unspecified place in unspecified non-institutional (private) residence as the place of occurrence of the external cause: Secondary | ICD-10-CM

## 2018-01-27 DIAGNOSIS — Z87891 Personal history of nicotine dependence: Secondary | ICD-10-CM | POA: Insufficient documentation

## 2018-01-27 DIAGNOSIS — W19XXXA Unspecified fall, initial encounter: Secondary | ICD-10-CM

## 2018-01-27 DIAGNOSIS — Z79899 Other long term (current) drug therapy: Secondary | ICD-10-CM | POA: Insufficient documentation

## 2018-01-27 DIAGNOSIS — I10 Essential (primary) hypertension: Secondary | ICD-10-CM | POA: Insufficient documentation

## 2018-01-27 DIAGNOSIS — R443 Hallucinations, unspecified: Secondary | ICD-10-CM

## 2018-01-27 DIAGNOSIS — Z7982 Long term (current) use of aspirin: Secondary | ICD-10-CM | POA: Insufficient documentation

## 2018-01-27 DIAGNOSIS — M25551 Pain in right hip: Secondary | ICD-10-CM | POA: Insufficient documentation

## 2018-01-27 DIAGNOSIS — R4182 Altered mental status, unspecified: Secondary | ICD-10-CM | POA: Diagnosis present

## 2018-01-27 LAB — URINALYSIS, ROUTINE W REFLEX MICROSCOPIC
Bilirubin Urine: NEGATIVE
Glucose, UA: NEGATIVE mg/dL
Hgb urine dipstick: NEGATIVE
KETONES UR: NEGATIVE mg/dL
Nitrite: NEGATIVE
PROTEIN: NEGATIVE mg/dL
Specific Gravity, Urine: 1.014 (ref 1.005–1.030)
pH: 6 (ref 5.0–8.0)

## 2018-01-27 LAB — CBC WITH DIFFERENTIAL/PLATELET
BASOS ABS: 0 10*3/uL (ref 0.0–0.1)
BASOS PCT: 0 %
Eosinophils Absolute: 0.1 10*3/uL (ref 0.0–0.7)
Eosinophils Relative: 2 %
HEMATOCRIT: 38.4 % (ref 36.0–46.0)
Hemoglobin: 12.4 g/dL (ref 12.0–15.0)
Lymphocytes Relative: 51 %
Lymphs Abs: 2.1 10*3/uL (ref 0.7–4.0)
MCH: 28.6 pg (ref 26.0–34.0)
MCHC: 32.3 g/dL (ref 30.0–36.0)
MCV: 88.7 fL (ref 78.0–100.0)
MONO ABS: 0.5 10*3/uL (ref 0.1–1.0)
Monocytes Relative: 13 %
NEUTROS ABS: 1.4 10*3/uL — AB (ref 1.7–7.7)
Neutrophils Relative %: 34 %
PLATELETS: 196 10*3/uL (ref 150–400)
RBC: 4.33 MIL/uL (ref 3.87–5.11)
RDW: 15.9 % — AB (ref 11.5–15.5)
WBC: 4.1 10*3/uL (ref 4.0–10.5)

## 2018-01-27 LAB — COMPREHENSIVE METABOLIC PANEL
ALBUMIN: 3.4 g/dL — AB (ref 3.5–5.0)
ALT: 11 U/L — ABNORMAL LOW (ref 14–54)
AST: 15 U/L (ref 15–41)
Alkaline Phosphatase: 88 U/L (ref 38–126)
Anion gap: 9 (ref 5–15)
BUN: 12 mg/dL (ref 6–20)
CHLORIDE: 101 mmol/L (ref 101–111)
CO2: 26 mmol/L (ref 22–32)
Calcium: 9.2 mg/dL (ref 8.9–10.3)
Creatinine, Ser: 0.92 mg/dL (ref 0.44–1.00)
GFR calc Af Amer: >60 mL/min (ref >60)
GFR calc non Af Amer: >60 mL/min (ref >60)
GLUCOSE: 98 mg/dL (ref 65–99)
POTASSIUM: 3.8 mmol/L (ref 3.5–5.1)
Sodium: 136 mmol/L (ref 135–145)
Total Bilirubin: 0.4 mg/dL (ref 0.3–1.2)
Total Protein: 7.2 g/dL (ref 6.5–8.1)

## 2018-01-27 LAB — RAPID URINE DRUG SCREEN, HOSP PERFORMED
Amphetamines: NOT DETECTED
BENZODIAZEPINES: NOT DETECTED
Barbiturates: NOT DETECTED
COCAINE: NOT DETECTED
Opiates: NOT DETECTED
Tetrahydrocannabinol: NOT DETECTED

## 2018-01-27 LAB — CBG MONITORING, ED: Glucose-Capillary: 141 mg/dL — ABNORMAL HIGH (ref 65–99)

## 2018-01-27 LAB — ETHANOL

## 2018-01-27 MED ORDER — ACETAMINOPHEN 325 MG PO TABS
650.0000 mg | ORAL_TABLET | ORAL | Status: DC | PRN
  Administered 2018-01-27: 650 mg via ORAL
  Filled 2018-01-27: qty 2

## 2018-01-27 MED ORDER — QUETIAPINE FUMARATE 25 MG PO TABS
12.5000 mg | ORAL_TABLET | Freq: Two times a day (BID) | ORAL | Status: DC
  Administered 2018-01-27 – 2018-01-29 (×4): 12.5 mg via ORAL
  Filled 2018-01-27 (×4): qty 1

## 2018-01-27 MED ORDER — QUETIAPINE FUMARATE 25 MG PO TABS: 25.0000 mg | ORAL_TABLET | Freq: Every day | ORAL | Status: DC

## 2018-01-27 MED ORDER — DILTIAZEM HCL ER COATED BEADS 180 MG PO CP24
180.0000 mg | ORAL_CAPSULE | Freq: Two times a day (BID) | ORAL | Status: DC
  Administered 2018-01-27 – 2018-01-28 (×2): 180 mg via ORAL
  Filled 2018-01-27 (×6): qty 1

## 2018-01-27 MED ORDER — CEPHALEXIN 250 MG PO CAPS
500.0000 mg | ORAL_CAPSULE | Freq: Two times a day (BID) | ORAL | Status: DC
  Administered 2018-01-27 – 2018-01-29 (×5): 500 mg via ORAL
  Filled 2018-01-27 (×5): qty 2

## 2018-01-27 MED ORDER — ATORVASTATIN CALCIUM 10 MG PO TABS
10.0000 mg | ORAL_TABLET | Freq: Every day | ORAL | Status: DC
  Administered 2018-01-27 – 2018-01-28 (×2): 10 mg via ORAL
  Filled 2018-01-27 (×3): qty 1

## 2018-01-27 MED ORDER — ASPIRIN EC 81 MG PO TBEC
81.0000 mg | DELAYED_RELEASE_TABLET | Freq: Two times a day (BID) | ORAL | Status: DC
  Administered 2018-01-27 – 2018-01-29 (×4): 81 mg via ORAL
  Filled 2018-01-27 (×4): qty 1

## 2018-01-27 NOTE — ED Triage Notes (Signed)
TTS called to report PT meets in-patients needs

## 2018-01-27 NOTE — ED Triage Notes (Signed)
The patient's daughter said her mother called her and said she had had a stroke.  The patient's daughter said she did not have any symptoms of a stroke but she had noticed her mother has been confused more than usual.   Two months ago, a police office called her and told her her mother was outside naked.  She has tried to call her mother's MD to have her evaluated for Dementia but he has not contacted her.  No other symptoms.

## 2018-01-27 NOTE — ED Notes (Signed)
[facility] burgundy [facility]. Daughter w/pt.

## 2018-01-27 NOTE — ED Triage Notes (Signed)
PT Daughter at staff desk

## 2018-01-27 NOTE — ED Notes (Signed)
Patient denies pain and is resting comfortably.  

## 2018-01-27 NOTE — BH Assessment (Addendum)
Tele Assessment Note   Patient Name: Erika Wang MRN: [id number] Referring Physician: Dr. Baxter Hire Ward Location of Patient: [facility] ED Location of Provider: Behavioral Health TTS Department  Erika Wang is an 67 y.o. female.  The pt came in after falling in her home and being confused.  The consult was done with the pt's daughter Erika Wang, who is her guardian.  The pt's daughter stated about 2 months ago the pt was found outside with no clothes on when it was cold outside.  The pt ws brought to the hospital and it was determined the pt's bizarre behavior was probably due to a UTI.  After being sent home the pt would only say "wow" for 3 days and had trouble doing basic functions such as writing her name.  The pt's daughter reported the pt has reported that she sees demons and spirits.  The pt has reported that people, who live an hour and half away have come to visit her.  The pt didn't think the people came to visit and checked cameras to verify that people were not visiting her mother.  The pt's daughter also stated she thinks the pt mixes up the TV and reality, such as thinking that her daughter had died.  The pt also thinks that people are out to get her.  The pt states she carries a knife on her.  The pt's daughter reported the pt has always carried a knife.  The pt and the pt's daughter denies any previous psychiatric treatment.  The pt has not been evaluated for dementia.  According to the the pt's daughter, the pt "talks in a loop" (repeats the same stories). The pt stated she is sleeping fine at night, but the pt's daughter stated the pt is not sleeping well at night.   The pt denies SI, HI, and SA.  Diagnosis: F23 Brief psychotic disorder   Past Medical History:  Past Medical History:  Diagnosis Date  . High cholesterol   . Hypertension   . Stroke [facility])     Past Surgical History:  Procedure Laterality Date  . CESAREAN SECTION      Family History: History  reviewed. No pertinent family history.  Social History:  reports that she quit smoking about 4 years ago. She has never used smokeless tobacco. She reports that she does not drink alcohol or use drugs.  Additional Social History:  Alcohol / Drug Use Pain Medications: See MAR Prescriptions: See MAR Over the Counter: See MAR History of alcohol / drug use?: No history of alcohol / drug abuse Longest period of sobriety (when/how long): NA  CIWA: CIWA-Ar BP: (!) 141/74 Pulse Rate: (!) 54 COWS:    Allergies:  Allergies  Allergen Reactions  . Ace Inhibitors Other (See Comments)    Acute renal failure    Home Medications:  (Not in a hospital admission)  OB/GYN Status:  No LMP recorded. Patient is postmenopausal.  General Assessment Data Location of Assessment: [facility] ED TTS Assessment: In system Is this a Tele or Face-to-Face Assessment?: Tele Assessment Is this an Initial Assessment or a Re-assessment for this encounter?: Initial Assessment Marital status: Single Maiden name: Hickling Is patient pregnant?: No Pregnancy Status: No Living Arrangements: Alone Can pt return to current living arrangement?: Yes Admission Status: Voluntary Is patient capable of signing voluntary admission?: No(daughter is the pt's guardian) Referral Source: Self/Family/Friend Insurance type: Medicaid     Crisis Care Plan Living Arrangements: Alone Legal Guardian: Other relative(daughter Erika Wang [phone]) Name  of Psychiatrist: none Name of Therapist: none  Education Status Is patient currently in school?: No Is the patient employed, unemployed or receiving disability?: Unemployed  Risk to self with the past 6 months Suicidal Ideation: No Has patient been a risk to self within the past 6 months prior to admission? : No Suicidal Intent: No Has patient had any suicidal intent within the past 6 months prior to admission? : Other (comment) Is patient at risk for suicide?: No Suicidal  Plan?: No Has patient had any suicidal plan within the past 6 months prior to admission? : No Access to Means: No What has been your use of drugs/alcohol within the last 12 months?: none Previous Attempts/Gestures: No How many times?: 0 Other Self Harm Risks: none Triggers for Past Attempts: None known Intentional Self Injurious Behavior: None Family Suicide History: No Recent stressful life event(s): Recent negative physical changes(fell last night and was confused) Persecutory voices/beliefs?: Yes Depression: No Depression Symptoms: Insomnia Substance abuse history and/or treatment for substance abuse?: No Suicide prevention information given to non-admitted patients: Not applicable  Risk to Others within the past 6 months Homicidal Ideation: No Does patient have any lifetime risk of violence toward others beyond the six months prior to admission? : No Thoughts of Harm to Others: No Current Homicidal Intent: No Current Homicidal Plan: No Access to Homicidal Means: No Identified Victim: none History of harm to others?: No Assessment of Violence: None Noted Violent Behavior Description: none Does patient have access to weapons?: Yes (Comment)(has a pocket knife) Criminal Charges Pending?: No Does patient have a court date: No Is patient on probation?: No  Psychosis Hallucinations: Auditory, Visual Delusions: Persecutory  Mental Status Report Appearance/Hygiene: In hospital gown, Unremarkable Eye Contact: Fair Motor Activity: Unable to assess Speech: Logical/coherent Level of Consciousness: Alert Mood: Pleasant Affect: Appropriate to circumstance Anxiety Level: None Thought Processes: Coherent, Relevant Judgement: Partial Orientation: Person, Place, Time, Situation Obsessive Compulsive Thoughts/Behaviors: None  Cognitive Functioning Concentration: Normal Memory: Recent Intact, Remote Intact Is patient IDD: No Is patient DD?: No Insight: Fair Impulse Control:  Fair Appetite: Good Have you had any weight changes? : No Change Sleep: Decreased Total Hours of Sleep: 5 Vegetative Symptoms: None  ADLScreening [facility] Assessment Services) Patient's cognitive ability adequate to safely complete daily activities?: Yes Patient able to express need for assistance with ADLs?: Yes Independently performs ADLs?: Yes (appropriate for developmental age)  Prior Inpatient Therapy Prior Inpatient Therapy: No  Prior Outpatient Therapy Prior Outpatient Therapy: No Does patient have an ACCT team?: No Does patient have Intensive In-House Services?  : No Does patient have [facility] services? : No Does patient have P4CC services?: No  ADL Screening (condition at time of admission) Patient's cognitive ability adequate to safely complete daily activities?: Yes Patient able to express need for assistance with ADLs?: Yes Independently performs ADLs?: Yes (appropriate for developmental age)       Abuse/Neglect Assessment (Assessment to be complete while patient is alone) Abuse/Neglect Assessment Can Be Completed: Yes Physical Abuse: Denies Verbal Abuse: Denies Sexual Abuse: Denies Exploitation of patient/patient's resources: Denies Self-Neglect: Denies Values / Beliefs Cultural Requests During Hospitalization: None Spiritual Requests During Hospitalization: None Consults Spiritual Care Consult Needed: No Social Work Consult Needed: No            Disposition:  Disposition Initial Assessment Completed for this Encounter: Yes   Leighton Ruff NP recommends inpatient treatment.  RN Magda Paganini was made aware of the notification.  This service was provided via telemedicine using  a 2-way, interactive audio and Immunologistvideo technology.  Names of all persons participating in this telemedicine service and their role in this encounter. Name: Veatrice KellsMelvena Schoeller Role: Pt  Name: Erika GrosEbony Wang Role: Pt's daughter and guardian  Name: Erika Wang Role: TTS  Name:  Role:      Ottis StainGarvin, Waylon Koffler Jermaine 01/27/2018 11:48 AM

## 2018-01-27 NOTE — ED Provider Notes (Signed)
Received care of pt from Dr. Elesa MassedWard.  Please see notes for prior hx and physical. Briefly this is a 67yo female with history of months of hallucinations, paranoia and fall yesterday. Labs pending however Dr. Elesa MassedWard and daughter concerned regarding patient's safety given ongoing symptoms and desire geripsych placement if no clear abnormalities on labs. CT without abnormalities. No stroke like symptoms.  Labs returned without significant abnormalities.  I overall do not feel UA is consistent with UTI given trace leuks, rare bacteria, however given 6-30WBC will give keflex. Do feel her primary issue, however is not infection but suspect other underlying etiology of chronic paranoia, hallucinations such as dementia/lewy body.  TTS consulted.  TTS evaluated. Will start seroquel low dose. Will repeat ECG in AM.  Pt appropriate for inpatient, will continue seeking placement and evaluating pt response to treatment.    Alvira MondaySchlossman, Scorpio Fortin, MD 01/27/18 [id number]

## 2018-01-27 NOTE — ED Notes (Signed)
Pt. To CT via stretcher. 

## 2018-01-27 NOTE — ED Provider Notes (Signed)
TIME SEEN: 6:43 AM  CHIEF COMPLAINT: Altered mental status  HPI: Patient is a 67 year old female with history of hypertension, hyperlipidemia, CVA who presents to the emergency department with worsening altered mental status over the past 2 months.  Daughter provides most of the history.  She states that the patient called her yesterday and stated that she had fallen in front of the refrigerator in her studio apartment and could not get off the ground.  She told her daughter that she thought she had a stroke.  She told me that she felt lightheaded and that is what caused her to fall.  She cannot tell me why she thinks that she had a stroke.  Daughter states when she got there the patient was talking and moving all of her extremities.  Was complaining of back pain and then hip pain in the waiting room but she denies any pain at this time.  No chest pain or abdominal pain.  No fevers.  No cough, vomiting or diarrhea.  Daughter reports that until a few months ago patient had been acting normally.  She lives at home by herself and is able to care for herself but in November she had an episode where she was found outside in a sports bra only in the winter by police.  She was brought to the emergency department and had a workup including labs and a head CT which were unremarkable.  She had an equivocal urine.  Was treated for possible urinary tract infection.  Daughter reports that the psychiatry team saw the patient and initially recommended a geri-psychiatric placement which daughter agreed with.  Daughter states that she had to leave to go home and take a shower and by the time she got back the daytime psychiatric team had change the patient's disposition and she was discharged home.  Daughter states over the next several days patient became almost catatonic, not speaking.  It took almost a week before she improved and since that time has never gone back to her baseline.  The daughter reports that over the past  several months the patient has been having auditory and visual hallucinations and has been very paranoid.  She states that the patient thinks that people are trying to hurt her.  Patient states that she sees spirits and demons and thinks that the demons are going to hurt her.  She has breakfast with Jesus and has conversations with him as well.  She denies any SI or HI.  Daughter is not aware of any previous psychiatric history.  Daughter is concerned about patient's safety.  Rosendo Gros- daughter - [phone]  ROS: See HPI Constitutional: no fever  Eyes: no drainage  ENT: no runny nose   Cardiovascular:  no chest pain  Resp: no SOB  GI: no vomiting GU: no dysuria Integumentary: no rash  Allergy: no hives  Musculoskeletal: no leg swelling  Neurological: no slurred speech ROS otherwise negative  PAST MEDICAL HISTORY/PAST SURGICAL HISTORY:  Past Medical History:  Diagnosis Date  . High cholesterol   . Hypertension   . Stroke [facility])     MEDICATIONS:  Prior to Admission medications   Medication Sig Start Date End Date Taking? Authorizing Provider  aspirin EC 81 MG tablet Take 81 mg by mouth 3 (three) times daily.    [provider]  cephALEXin (KEFLEX) 500 MG capsule Take 1 capsule (500 mg total) by mouth every 8 (eight) hours. 09/01/17   Charm Rings, NP  diltiazem (CARDIZEM CD)  180 MG 24 hr capsule Take 180 mg by mouth 2 (two) times daily.    [provider]  hydrochlorothiazide (HYDRODIURIL) 25 MG tablet Take 25 mg by mouth daily.    [provider]    ALLERGIES:  Allergies  Allergen Reactions  . Ace Inhibitors Other (See Comments)    Acute renal failure    SOCIAL HISTORY:  Social History   Tobacco Use  . Smoking status: Former Smoker    Last attempt to quit: 09/28/2013    Years since quitting: 4.3  . Smokeless tobacco: Never Used  Substance Use Topics  . Alcohol use: Never    Frequency: Never    FAMILY HISTORY: History reviewed.  No pertinent family history.  EXAM: BP (!) 155/78 (BP Location: Left Arm)   Pulse (!) 54   Temp (!) 97.4 F (36.3 C) (Oral)   Resp 16   SpO2 99%  CONSTITUTIONAL: Alert and oriented x3 and responds appropriately to questions intermittently but has very tangential thought process.  Thin, chronically ill-appearing, smiling and laughing sometimes and appropriately HEAD: Normocephalic; atraumatic EYES: Conjunctivae clear, PERRL, EOMI ENT: normal nose; no rhinorrhea; moist mucous membranes; pharynx without lesions noted; no dental injury; no septal hematoma NECK: Supple, no meningismus, no LAD; no midline spinal tenderness, step-off or deformity; trachea midline CARD: RRR; S1 and S2 appreciated; no murmurs, no clicks, no rubs, no gallops RESP: Normal chest excursion without splinting or tachypnea; breath sounds clear and equal bilaterally; no wheezes, no rhonchi, no rales; no hypoxia or respiratory distress CHEST:  chest wall stable, no crepitus or ecchymosis or deformity, nontender to palpation; no flail chest ABD/GI: Normal bowel sounds; non-distended; soft, non-tender, no rebound, no guarding; no ecchymosis or other lesions noted PELVIS:  stable, nontender to palpation BACK:  The back appears normal and is non-tender to palpation, there is no CVA tenderness; no midline spinal tenderness, step-off or deformity EXT: Normal ROM in all joints; non-tender to palpation; no edema; normal capillary refill; no cyanosis, no bony tenderness or bony deformity of patient's extremities, no joint effusion, compartments are soft, extremities are warm and well-perfused, no ecchymosis SKIN: Normal color for age and race; warm NEURO: Moves all extremities equally, sensation to light touch intact diffusely, cranial nerves II through XII intact, normal speech PSYCH: Patient has very tangential thought process.  She will start talking about her fall and then going to talking about changes in her diet then history of  smoking and so on.  Daughter reports that she can do this for hours.  Patient seems to be intermittently responding to internal stimuli.  She admits to paranoid thoughts, visual and auditory hallucinations.  She denies SI or HI.  Patient will begin laughing inappropriately during my examination.  MEDICAL DECISION MAKING: Patient here with worsening altered mental status.  I feel this is possibly related to dementia and psychiatric illness.  She had a fall today which is what prompted her visit to the emergency department.  She has no obvious sign of trauma on exam.  Was initially complaining of hip pain but has no hip pain on examination, no leg length discrepancy and has full range of motion in all joints.  She has been able to ambulate.  X-ray obtained in triage is unremarkable.  I have recommended that we obtain CT of her head and cervical spine given we cannot rule out that she hit her head during this fall and obtain labs, urine, EKG.  I feel if her medical workup is  unremarkable that she needs psychiatric evaluation and she needs geri psychiatric placement.  I do not feel this patient is safe to be discharged home this time and daughter agrees.  I am concerned that her paranoia could cause her to do something that can be dangerous to herself or others.  She is responding to internal stimuli and reports auditory and visual hallucinations.  I feel she would benefit from geri-psych placement.  Patient also agrees with this plan and is here voluntarily.  Daughter is very tearful.  She is very worried that if she leaves the emergency department that her mother will be discharged home again.  Discussed with her that I will recommend psychiatric placement for this patient if medical workup is negative.  Daughter requests that she be updated at the phone number listed above in bold if she is not present in the room when patient seen by psychiatry.   ED PROGRESS: Patient's imaging is all unremarkable.  Labs and  urine pending.  If patient is medically cleared, TTS will be consulted.  Again I strongly recommend geri-psych placement for this patient and patient and daughter agree.  I suspect she could have underlying dementia and psychiatric illness.  I feel she needs further evaluation, monitoring and medications.  Signed out to Dr. Dalene SeltzerSchlossman to follow-up on patient's labs and urine.   I reviewed all nursing notes, vitals, pertinent previous records, EKGs, lab and urine results, imaging (as available).      EKG Interpretation  Date/Time:  Sunday January 27 2018 01:53:30 EDT Ventricular Rate:  64 PR Interval:  180 QRS Duration: 88 QT Interval:  394 QTC Calculation: 406 R Axis:   -40 Text Interpretation:  Normal sinus rhythm Left axis deviation Abnormal ECG No significant change since last tracing Confirmed by Rylah Fukuda, Baxter HireKristen [phone]) on 01/27/2018 6:55:17 AM         Rease Swinson, Layla MawKristen N, DO 01/27/18 [id number]

## 2018-01-27 NOTE — ED Notes (Signed)
Fall Risk bracelet applied to pt's right wrist and Fall Risk sign placed on doorway.

## 2018-01-27 NOTE — ED Notes (Signed)
Tylenol given as requested - states "I just take it but I don't have any pain".

## 2018-01-27 NOTE — ED Notes (Signed)
Daughter has now left. Took all of pt's belongings including clothing and cell phone. Daughter is in agreement w/tx plan - Geri-Psych placement.

## 2018-01-28 DIAGNOSIS — F23 Brief psychotic disorder: Secondary | ICD-10-CM | POA: Diagnosis not present

## 2018-01-28 NOTE — ED Notes (Signed)
Erika GrosEbony Thomas ( daughter/legal guardian ) notified by RN  that she needs to sign the consent for voluntary admission document [facility] by [facility] , she advised RN that she will be here this morning to sign the document .

## 2018-01-28 NOTE — ED Triage Notes (Signed)
Pt assisted with sitting up in be for lunch.

## 2018-01-28 NOTE — Progress Notes (Signed)
Pt. meets criteria for inpatient treatment per Felicity CoyerJustina Onkonkwo, NP.  Patient information referred out to the following hospitals: [facility]     [facility]  [facility]  [facility]  [facility]  [facility]  [facility]       Disposition CSW will continue to follow for placement.  Timmothy EulerJean T. Kaylyn LimSutter, MSW, LCSWA Disposition Clinical Social Work [phone] (cell) [phone] (office)

## 2018-01-28 NOTE — Progress Notes (Signed)
CSW contacted pt's daughter Karel Jarvisbony and assessed that she would [facility] 15 to 20 min.   Wells GuilesSarah Bali Lyn, LCSW, LCAS Disposition CSW [facility] [phone]

## 2018-01-28 NOTE — BH Assessment (Signed)
[facility] Assessment Progress Note  Pt reassessed this morning. Pt is pleasant and responds to questions appropriately. She denied SI, HI, AVH, or paranoia. When asked what brought her to the hospital, pt indicated that she had a stroke yesterday and has since been given medication to help it. It is noted that pt actually did not have a stroke. Based on pt's conviction that she had a stroke and compelling collateral information obtained from pt's daughter yesterday, IP gero-psych treatment is still recommended.   Erika ShockSamantha M. Ladona Ridgelaylor, MS, NCC, LPCA Counselor

## 2018-01-28 NOTE — ED Triage Notes (Signed)
TTS done 01-28-18

## 2018-01-28 NOTE — ED Notes (Signed)
Legal guardianship documents [facility] by secretary [facility] [phone] , original copy returned to daughter , copies at patients chart.

## 2018-01-28 NOTE — ED Notes (Signed)
Pharmacist notified on pt.'s Cardizem CD order .

## 2018-01-28 NOTE — Progress Notes (Signed)
Efraim KaufmannMelissa, RN @ [facility] called and stated that [facility] pending recent vitals, chest x-ray, and guardianship paperwork be sent. CSW faxed pt's most recent vitals and chest x-ray results from 01/27/18 to [facility] and spoke with pt's daughter, Karel Jarvisbony [phone]), who stated that she will bring documentation noting she is her mother's legal guardian to [facility] later tonight. Magda PaganiniAudrey, RN [facility] was notified and will fax paperwork directly [facility] [phone]) when it is received.   CSW will continue to coordinate inpatient placement for pt.   Wells GuilesSarah Estellar Cadena, LCSW, LCAS Disposition CSW [facility] [phone]

## 2018-01-28 NOTE — ED Triage Notes (Signed)
PT assisted with setting up for lunch.

## 2018-01-28 NOTE — ED Notes (Signed)
Breakfast tray ordered 

## 2018-01-29 DIAGNOSIS — F23 Brief psychotic disorder: Secondary | ICD-10-CM | POA: Diagnosis not present

## 2018-01-29 LAB — URINE CULTURE: Culture: NO GROWTH

## 2018-01-29 NOTE — ED Notes (Addendum)
Pt's daughter came to ED and signed [facility] consent form - [facility]. Copy sent to Medical Records and original placed in envelope for [facility].

## 2018-01-29 NOTE — ED Notes (Signed)
Pt on phone w daughter

## 2018-01-29 NOTE — ED Notes (Signed)
Regular Diet was ordered for Lunch. 

## 2018-01-29 NOTE — Discharge Planning (Signed)
Discussed at Long Length of Stay Meetings, dates discussed:   4/2:  [facility]

## 2018-01-29 NOTE — ED Provider Notes (Signed)
[facility] Dr. Tamera PuntSubedi   Marylynn Rigdon, MD 01/29/18 1043

## 2018-01-29 NOTE — ED Notes (Signed)
Verified w/Audrey Oneta RackMcKeown, RN w/Ebony, daughter, Legal Guardian - [facility].

## 2018-01-29 NOTE — Progress Notes (Signed)
CSW called and spoke to pt's daughter, who is her legal guardian.  [facility] is [facility] way [facility] to sign consents.  CSW called Press photographerCharge Nurse, Becky B, to notify.  Timmothy EulerJean T. Kaylyn LimSutter, MSW, LCSWA Disposition Clinical Social Work [phone] (cell) [phone] (office)

## 2018-01-29 NOTE — Progress Notes (Signed)
[facility]  Dr Joseph ArtSubedi is the accepting provider.  Call report to [phone]  [facility] notified.   Pt is Voluntary-Consent for treatment signed by daughter who is Legal Guardian, Rosendo Grosbony Thomas.  Pt may be transported by [facility]  [facility]  [facility] can be arranged.  Timmothy EulerJean T. Kaylyn LimSutter, MSW, LCSWA Disposition Clinical Social Work [phone] (cell) [phone] (office)

## 2019-01-02 ENCOUNTER — Emergency Department (HOSPITAL_COMMUNITY): Payer: Medicare HMO

## 2019-01-02 ENCOUNTER — Inpatient Hospital Stay (HOSPITAL_COMMUNITY)
Admission: EM | Admit: 2019-01-02 | Discharge: 2019-01-31 | DRG: 004 | Disposition: A | Discharge status: Other Acute Inpatient Hospital | Payer: Medicare HMO | Attending: Internal Medicine | Admitting: Internal Medicine

## 2019-01-02 ENCOUNTER — Other Ambulatory Visit: Payer: Self-pay

## 2019-01-02 ENCOUNTER — Encounter (HOSPITAL_COMMUNITY): Payer: Self-pay | Admitting: Neurology

## 2019-01-02 DIAGNOSIS — Z515 Encounter for palliative care: Secondary | ICD-10-CM

## 2019-01-02 DIAGNOSIS — R569 Unspecified convulsions: Secondary | ICD-10-CM | POA: Diagnosis not present

## 2019-01-02 DIAGNOSIS — R6521 Severe sepsis with septic shock: Secondary | ICD-10-CM | POA: Diagnosis present

## 2019-01-02 DIAGNOSIS — E870 Hyperosmolality and hypernatremia: Secondary | ICD-10-CM | POA: Diagnosis present

## 2019-01-02 DIAGNOSIS — R1310 Dysphagia, unspecified: Secondary | ICD-10-CM | POA: Diagnosis not present

## 2019-01-02 DIAGNOSIS — I10 Essential (primary) hypertension: Secondary | ICD-10-CM | POA: Diagnosis present

## 2019-01-02 DIAGNOSIS — B59 Pneumocystosis: Secondary | ICD-10-CM | POA: Diagnosis not present

## 2019-01-02 DIAGNOSIS — Z452 Encounter for adjustment and management of vascular access device: Secondary | ICD-10-CM

## 2019-01-02 DIAGNOSIS — E7800 Pure hypercholesterolemia, unspecified: Secondary | ICD-10-CM | POA: Diagnosis present

## 2019-01-02 DIAGNOSIS — J9601 Acute respiratory failure with hypoxia: Secondary | ICD-10-CM

## 2019-01-02 DIAGNOSIS — R339 Retention of urine, unspecified: Secondary | ICD-10-CM | POA: Diagnosis present

## 2019-01-02 DIAGNOSIS — Z7982 Long term (current) use of aspirin: Secondary | ICD-10-CM | POA: Diagnosis not present

## 2019-01-02 DIAGNOSIS — I69354 Hemiplegia and hemiparesis following cerebral infarction affecting left non-dominant side: Secondary | ICD-10-CM | POA: Diagnosis not present

## 2019-01-02 DIAGNOSIS — T420X5A Adverse effect of hydantoin derivatives, initial encounter: Secondary | ICD-10-CM | POA: Diagnosis present

## 2019-01-02 DIAGNOSIS — A411 Sepsis due to other specified staphylococcus: Secondary | ICD-10-CM | POA: Diagnosis present

## 2019-01-02 DIAGNOSIS — T1490XA Injury, unspecified, initial encounter: Secondary | ICD-10-CM

## 2019-01-02 DIAGNOSIS — G40301 Generalized idiopathic epilepsy and epileptic syndromes, not intractable, with status epilepticus: Secondary | ICD-10-CM | POA: Diagnosis not present

## 2019-01-02 DIAGNOSIS — Z87891 Personal history of nicotine dependence: Secondary | ICD-10-CM | POA: Diagnosis not present

## 2019-01-02 DIAGNOSIS — E785 Hyperlipidemia, unspecified: Secondary | ICD-10-CM | POA: Diagnosis present

## 2019-01-02 DIAGNOSIS — T17908D Unspecified foreign body in respiratory tract, part unspecified causing other injury, subsequent encounter: Secondary | ICD-10-CM | POA: Diagnosis not present

## 2019-01-02 DIAGNOSIS — J9621 Acute and chronic respiratory failure with hypoxia: Secondary | ICD-10-CM | POA: Diagnosis not present

## 2019-01-02 DIAGNOSIS — D649 Anemia, unspecified: Secondary | ICD-10-CM | POA: Diagnosis not present

## 2019-01-02 DIAGNOSIS — Z01818 Encounter for other preprocedural examination: Secondary | ICD-10-CM | POA: Diagnosis not present

## 2019-01-02 DIAGNOSIS — J690 Pneumonitis due to inhalation of food and vomit: Secondary | ICD-10-CM | POA: Diagnosis not present

## 2019-01-02 DIAGNOSIS — E872 Acidosis: Secondary | ICD-10-CM | POA: Diagnosis present

## 2019-01-02 DIAGNOSIS — T17908A Unspecified foreign body in respiratory tract, part unspecified causing other injury, initial encounter: Secondary | ICD-10-CM | POA: Diagnosis not present

## 2019-01-02 DIAGNOSIS — R4182 Altered mental status, unspecified: Secondary | ICD-10-CM | POA: Diagnosis present

## 2019-01-02 DIAGNOSIS — G8194 Hemiplegia, unspecified affecting left nondominant side: Secondary | ICD-10-CM | POA: Diagnosis not present

## 2019-01-02 DIAGNOSIS — I361 Nonrheumatic tricuspid (valve) insufficiency: Secondary | ICD-10-CM | POA: Diagnosis not present

## 2019-01-02 DIAGNOSIS — G40A11 Absence epileptic syndrome, intractable, with status epilepticus: Secondary | ICD-10-CM | POA: Diagnosis present

## 2019-01-02 DIAGNOSIS — R4702 Dysphasia: Secondary | ICD-10-CM | POA: Diagnosis not present

## 2019-01-02 DIAGNOSIS — G40901 Epilepsy, unspecified, not intractable, with status epilepticus: Secondary | ICD-10-CM | POA: Diagnosis not present

## 2019-01-02 DIAGNOSIS — R29709 NIHSS score 9: Secondary | ICD-10-CM | POA: Diagnosis present

## 2019-01-02 DIAGNOSIS — R34 Anuria and oliguria: Secondary | ICD-10-CM | POA: Diagnosis present

## 2019-01-02 DIAGNOSIS — J181 Lobar pneumonia, unspecified organism: Secondary | ICD-10-CM

## 2019-01-02 DIAGNOSIS — R140 Abdominal distension (gaseous): Secondary | ICD-10-CM

## 2019-01-02 DIAGNOSIS — Z930 Tracheostomy status: Secondary | ICD-10-CM

## 2019-01-02 DIAGNOSIS — A419 Sepsis, unspecified organism: Secondary | ICD-10-CM | POA: Diagnosis not present

## 2019-01-02 DIAGNOSIS — G931 Anoxic brain damage, not elsewhere classified: Secondary | ICD-10-CM | POA: Diagnosis not present

## 2019-01-02 DIAGNOSIS — E876 Hypokalemia: Secondary | ICD-10-CM | POA: Diagnosis present

## 2019-01-02 DIAGNOSIS — Z79899 Other long term (current) drug therapy: Secondary | ICD-10-CM

## 2019-01-02 DIAGNOSIS — H9190 Unspecified hearing loss, unspecified ear: Secondary | ICD-10-CM | POA: Diagnosis present

## 2019-01-02 DIAGNOSIS — Z7189 Other specified counseling: Secondary | ICD-10-CM | POA: Diagnosis not present

## 2019-01-02 DIAGNOSIS — I69398 Other sequelae of cerebral infarction: Secondary | ICD-10-CM | POA: Diagnosis not present

## 2019-01-02 DIAGNOSIS — L8990 Pressure ulcer of unspecified site, unspecified stage: Secondary | ICD-10-CM

## 2019-01-02 DIAGNOSIS — G9340 Encephalopathy, unspecified: Secondary | ICD-10-CM | POA: Diagnosis not present

## 2019-01-02 DIAGNOSIS — J9690 Respiratory failure, unspecified, unspecified whether with hypoxia or hypercapnia: Secondary | ICD-10-CM

## 2019-01-02 DIAGNOSIS — R7881 Bacteremia: Secondary | ICD-10-CM | POA: Diagnosis not present

## 2019-01-02 DIAGNOSIS — Z0189 Encounter for other specified special examinations: Secondary | ICD-10-CM

## 2019-01-02 DIAGNOSIS — Z4659 Encounter for fitting and adjustment of other gastrointestinal appliance and device: Secondary | ICD-10-CM

## 2019-01-02 DIAGNOSIS — Z978 Presence of other specified devices: Secondary | ICD-10-CM

## 2019-01-02 DIAGNOSIS — J189 Pneumonia, unspecified organism: Secondary | ICD-10-CM

## 2019-01-02 DIAGNOSIS — R410 Disorientation, unspecified: Secondary | ICD-10-CM

## 2019-01-02 LAB — CBC WITH DIFFERENTIAL/PLATELET
Abs Immature Granulocytes: 0.04 10*3/uL (ref 0.00–0.07)
Basophils Absolute: 0 10*3/uL (ref 0.0–0.1)
Basophils Relative: 0 %
Eosinophils Absolute: 0 10*3/uL (ref 0.0–0.5)
Eosinophils Relative: 0 %
HEMATOCRIT: 46.6 % — AB (ref 36.0–46.0)
Hemoglobin: 14.9 g/dL (ref 12.0–15.0)
IMMATURE GRANULOCYTES: 0 %
Lymphocytes Relative: 13 %
Lymphs Abs: 1.4 10*3/uL (ref 0.7–4.0)
MCH: 29.6 pg (ref 26.0–34.0)
MCHC: 32 g/dL (ref 30.0–36.0)
MCV: 92.5 fL (ref 80.0–100.0)
Monocytes Absolute: 1.3 10*3/uL — ABNORMAL HIGH (ref 0.1–1.0)
Monocytes Relative: 11 %
NEUTROS ABS: 8.5 10*3/uL — AB (ref 1.7–7.7)
Neutrophils Relative %: 76 %
Platelets: 247 10*3/uL (ref 150–400)
RBC: 5.04 MIL/uL (ref 3.87–5.11)
RDW: 14.6 % (ref 11.5–15.5)
WBC: 11.3 10*3/uL — ABNORMAL HIGH (ref 4.0–10.5)
nRBC: 0 % (ref 0.0–0.2)

## 2019-01-02 LAB — URINALYSIS, ROUTINE W REFLEX MICROSCOPIC
Bacteria, UA: NONE SEEN
Bilirubin Urine: NEGATIVE
Glucose, UA: NEGATIVE mg/dL
Ketones, ur: 5 mg/dL — AB
Leukocytes,Ua: NEGATIVE
Nitrite: NEGATIVE
Protein, ur: 100 mg/dL — AB
Specific Gravity, Urine: 1.021 (ref 1.005–1.030)
pH: 5 (ref 5.0–8.0)

## 2019-01-02 LAB — COMPREHENSIVE METABOLIC PANEL
ALT: 16 U/L (ref 0–44)
AST: 34 U/L (ref 15–41)
Albumin: 3.2 g/dL — ABNORMAL LOW (ref 3.5–5.0)
Alkaline Phosphatase: 101 U/L (ref 38–126)
Anion gap: 14 (ref 5–15)
BUN: 20 mg/dL (ref 8–23)
CO2: 25 mmol/L (ref 22–32)
Calcium: 10 mg/dL (ref 8.9–10.3)
Chloride: 98 mmol/L (ref 98–111)
Creatinine, Ser: 1.01 mg/dL — ABNORMAL HIGH (ref 0.44–1.00)
GFR calc Af Amer: >60 mL/min (ref >60)
GFR calc non Af Amer: 58 mL/min — ABNORMAL LOW (ref >60)
Glucose, Bld: 118 mg/dL — ABNORMAL HIGH (ref 70–99)
Potassium: 4.2 mmol/L (ref 3.5–5.1)
Sodium: 137 mmol/L (ref 135–145)
Total Bilirubin: 0.7 mg/dL (ref 0.3–1.2)
Total Protein: 8.1 g/dL (ref 6.5–8.1)

## 2019-01-02 LAB — PHOSPHORUS: PHOSPHORUS: 2.5 mg/dL (ref 2.5–4.6)

## 2019-01-02 LAB — RAPID URINE DRUG SCREEN, HOSP PERFORMED
Amphetamines: NOT DETECTED
Barbiturates: NOT DETECTED
Benzodiazepines: NOT DETECTED
Cocaine: NOT DETECTED
Opiates: NOT DETECTED
Tetrahydrocannabinol: NOT DETECTED

## 2019-01-02 LAB — CK: Total CK: 534 U/L — ABNORMAL HIGH (ref 38–234)

## 2019-01-02 LAB — LACTIC ACID, PLASMA: LACTIC ACID, VENOUS: 1.6 mmol/L (ref 0.5–1.9)

## 2019-01-02 LAB — TSH: TSH: 1.272 u[IU]/mL (ref 0.350–4.500)

## 2019-01-02 LAB — CBG MONITORING, ED: Glucose-Capillary: 97 mg/dL (ref 70–99)

## 2019-01-02 LAB — ETHANOL: Alcohol, Ethyl (B): <10 mg/dL (ref <10)

## 2019-01-02 LAB — AMMONIA: Ammonia: 15 umol/L (ref 9–35)

## 2019-01-02 LAB — MAGNESIUM: Magnesium: 2 mg/dL (ref 1.7–2.4)

## 2019-01-02 MED ORDER — METRONIDAZOLE IN NACL 5-0.79 MG/ML-% IV SOLN
500.0000 mg | Freq: Once | INTRAVENOUS | Status: AC
  Administered 2019-01-02: 500 mg via INTRAVENOUS
  Filled 2019-01-02: qty 100

## 2019-01-02 MED ORDER — ASPIRIN 300 MG RE SUPP
300.0000 mg | Freq: Every day | RECTAL | Status: DC
  Administered 2019-01-02: 300 mg via RECTAL
  Filled 2019-01-02: qty 1

## 2019-01-02 MED ORDER — LORAZEPAM 2 MG/ML IJ SOLN
INTRAMUSCULAR | Status: AC
  Filled 2019-01-02: qty 1

## 2019-01-02 MED ORDER — ASPIRIN 325 MG PO TABS: 325.0000 mg | ORAL_TABLET | Freq: Every day | ORAL | Status: DC

## 2019-01-02 MED ORDER — SODIUM CHLORIDE 0.9 % IV SOLN
INTRAVENOUS | Status: DC
  Administered 2019-01-02 – 2019-01-06 (×9): via INTRAVENOUS

## 2019-01-02 MED ORDER — DEXTROSE-NACL 5-0.9 % IV SOLN: INTRAVENOUS | Status: DC

## 2019-01-02 MED ORDER — LEVETIRACETAM IN NACL 1000 MG/100ML IV SOLN: 1000.0000 mg | Freq: Two times a day (BID) | INTRAVENOUS | Status: DC

## 2019-01-02 MED ORDER — MIDAZOLAM 50MG/50ML (1MG/ML) PREMIX INFUSION
50.0000 mg/h | INTRAVENOUS | Status: DC
  Administered 2019-01-03: 50 mg/h via INTRAVENOUS
  Administered 2019-01-03: 10 mg/h via INTRAVENOUS
  Administered 2019-01-03: 30 mg/h via INTRAVENOUS
  Administered 2019-01-03: 40 mg/h via INTRAVENOUS
  Filled 2019-01-02: qty 50
  Filled 2019-01-02: qty 150
  Filled 2019-01-02: qty 100
  Filled 2019-01-02: qty 50

## 2019-01-02 MED ORDER — LORAZEPAM 2 MG/ML IJ SOLN
1.0000 mg | Freq: Once | INTRAMUSCULAR | Status: AC
  Administered 2019-01-02: 1 mg via INTRAVENOUS

## 2019-01-02 MED ORDER — HYDRALAZINE HCL 20 MG/ML IJ SOLN
10.0000 mg | Freq: Four times a day (QID) | INTRAMUSCULAR | Status: DC | PRN
  Administered 2019-01-07: 10 mg via INTRAVENOUS
  Filled 2019-01-02: qty 1

## 2019-01-02 MED ORDER — SODIUM CHLORIDE 0.9 % IV BOLUS
1000.0000 mL | Freq: Once | INTRAVENOUS | Status: AC
  Administered 2019-01-02: 1000 mL via INTRAVENOUS

## 2019-01-02 MED ORDER — IOHEXOL 350 MG/ML SOLN
100.0000 mL | Freq: Once | INTRAVENOUS | Status: AC | PRN
  Administered 2019-01-02: 100 mL via INTRAVENOUS

## 2019-01-02 MED ORDER — LORAZEPAM 2 MG/ML IJ SOLN
1.0000 mg | Freq: Four times a day (QID) | INTRAMUSCULAR | Status: DC | PRN
  Administered 2019-01-18 – 2019-01-20 (×2): 1 mg via INTRAVENOUS
  Filled 2019-01-02 (×3): qty 1

## 2019-01-02 MED ORDER — SODIUM CHLORIDE 0.9 % IV SOLN
3.0000 g | Freq: Three times a day (TID) | INTRAVENOUS | Status: DC
  Administered 2019-01-02 – 2019-01-03 (×2): 3 g via INTRAVENOUS
  Filled 2019-01-02 (×5): qty 3

## 2019-01-02 MED ORDER — SODIUM CHLORIDE 0.9 % IV SOLN
1.0000 g | Freq: Once | INTRAVENOUS | Status: AC
  Administered 2019-01-02: 1 g via INTRAVENOUS
  Filled 2019-01-02: qty 10

## 2019-01-02 MED ORDER — ACETAMINOPHEN 650 MG RE SUPP: 650.0000 mg | Freq: Four times a day (QID) | RECTAL | Status: DC | PRN

## 2019-01-02 MED ORDER — SODIUM CHLORIDE 0.9 % IV SOLN
500.0000 mg | Freq: Once | INTRAVENOUS | Status: DC
  Filled 2019-01-02: qty 500

## 2019-01-02 MED ORDER — SODIUM CHLORIDE 0.9 % IV SOLN
2000.0000 mg | Freq: Once | INTRAVENOUS | Status: AC
  Administered 2019-01-02: 2000 mg via INTRAVENOUS
  Filled 2019-01-02: qty 20

## 2019-01-02 MED ORDER — ENOXAPARIN SODIUM 40 MG/0.4ML ~~LOC~~ SOLN
40.0000 mg | SUBCUTANEOUS | Status: DC
  Administered 2019-01-02 – 2019-01-30 (×29): 40 mg via SUBCUTANEOUS
  Filled 2019-01-02 (×29): qty 0.4

## 2019-01-02 MED ORDER — ACETAMINOPHEN 325 MG PO TABS: 650.0000 mg | ORAL_TABLET | Freq: Four times a day (QID) | ORAL | Status: DC | PRN

## 2019-01-02 MED ORDER — ONDANSETRON HCL 4 MG PO TABS: 4.0000 mg | ORAL_TABLET | Freq: Four times a day (QID) | ORAL | Status: DC | PRN

## 2019-01-02 MED ORDER — SODIUM CHLORIDE 0.9 % IV BOLUS (SEPSIS)
500.0000 mL | Freq: Once | INTRAVENOUS | Status: AC
  Administered 2019-01-02: 500 mL via INTRAVENOUS

## 2019-01-02 MED ORDER — FENTANYL CITRATE (PF) 100 MCG/2ML IJ SOLN
INTRAMUSCULAR | Status: AC
  Administered 2019-01-03: 100 ug via INTRAVENOUS
  Filled 2019-01-02: qty 2

## 2019-01-02 MED ORDER — LEVETIRACETAM IN NACL 500 MG/100ML IV SOLN: 500.0000 mg | Freq: Two times a day (BID) | INTRAVENOUS | Status: DC

## 2019-01-02 MED ORDER — ALBUTEROL SULFATE (2.5 MG/3ML) 0.083% IN NEBU: 2.5000 mg | INHALATION_SOLUTION | RESPIRATORY_TRACT | Status: DC | PRN

## 2019-01-02 MED ORDER — SODIUM CHLORIDE 0.9 % IV SOLN
200.0000 mg | INTRAVENOUS | Status: AC
  Administered 2019-01-02: 200 mg via INTRAVENOUS
  Filled 2019-01-02: qty 20

## 2019-01-02 MED ORDER — LEVETIRACETAM IN NACL 1000 MG/100ML IV SOLN: 1000.0000 mg | INTRAVENOUS | Status: DC

## 2019-01-02 MED ORDER — STROKE: EARLY STAGES OF RECOVERY BOOK
Freq: Once | Status: AC
  Administered 2019-01-04: 21:00:00

## 2019-01-02 MED ORDER — PHENYTOIN SODIUM 50 MG/ML IJ SOLN: 75.0000 mg | Freq: Three times a day (TID) | INTRAMUSCULAR | Status: DC

## 2019-01-02 MED ORDER — SODIUM CHLORIDE 0.9 % IV SOLN
1000.0000 mg | Freq: Once | INTRAVENOUS | Status: AC
  Administered 2019-01-02: 1000 mg via INTRAVENOUS
  Filled 2019-01-02 (×2): qty 20

## 2019-01-02 MED ORDER — ONDANSETRON HCL 4 MG/2ML IJ SOLN: 4.0000 mg | Freq: Four times a day (QID) | INTRAMUSCULAR | Status: DC | PRN

## 2019-01-02 NOTE — H&P (Signed)
HISTORY AND PHYSICAL       PATIENT DETAILS Name: Erika Wang Age: 68 y.o. Sex: female Date of Birth: Nov 22, 1950 Admit Date: 01/02/2019 [id number], No Pcp Per   Patient coming from: Home   CHIEF COMPLAINT:  Altered mental status/unresponsive  HPI: Erika Wang is a 68 y.o. 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  She was last seen normal approximately 2 days back by her daughter-Per daughter-she was apparently slightly off balance but wise was in her usual state of health.  The daughter does not remember patient complaining about fever, headache, nausea, vomiting, abdominal pain or diarrhea.  Patient was not answering the daughter's phone calls, and apparently was also not come to the when the physical therapist went to see her-the daughter went to the patient's apartment manager and had the door open-patient was found lying on the floor covered with cat litter-with right gaze and unresponsive.  Patient was brought to the emergency room, and was found to have left-sided weakness, patient remained unresponsive-although had stable vital signs.  Neurology consultation was called-EEG was obtained-initial CT scans including of the head did not show any acute abnormalities.  Hospitalist service was then asked to admit this patient for further evaluation and treatment.  Due to patient being unresponsive-unable to obtain any further history.  Above history was obtained after reviewing patient's chart, and talking with the patient's daughter over the phone.  ED Course:  CT of the head, CT C-spine, CT cerebral perfusion-which was negative for large vessel occlusion, no core infarct or ischemic penumbra.  Right upper lobe pneumonia partially visible.  EEG (preliminary report) positive for seizure-like activity-patient given  Keppra, IV Rocephin and Zithromax and hooked up to long-term/overnight EEG monitoring.  Note: Lives at: Home Mobility:  Independent (sometimes uses a cane) Chronic Indwelling Foley:no   REVIEW OF SYSTEMS:  Unable to be obtained due to patient's mental status   ALLERGIES:   Allergies  Allergen Reactions  . Ace Inhibitors Other (See Comments)    Acute renal failure    PAST MEDICAL HISTORY: Past Medical History:  Diagnosis Date  . High cholesterol   . Hypertension   . Stroke [facility])     PAST SURGICAL HISTORY: Past Surgical History:  Procedure Laterality Date  . CESAREAN SECTION      MEDICATIONS AT HOME: Prior to Admission medications   Medication Sig Start Date End Date Taking? Authorizing Provider  aspirin EC 81 MG tablet Take 81 mg by mouth 2 (two) times daily.    Yes [provider]  atorvastatin (LIPITOR) 10 MG tablet Take 10 mg by mouth daily.   Yes [provider]  diltiazem (CARDIZEM CD) 180 MG 24 hr capsule Take 180 mg by mouth 2 (two) times daily.   Yes [provider]    FAMILY HISTORY: Family History  Problem Relation Age of Onset  . Hypertension Mother   . Hypertension Father     SOCIAL HISTORY:  reports that she quit smoking about 5 years ago. She has never used smokeless tobacco. She reports that she does not drink alcohol or use drugs.  PHYSICAL EXAM: Blood pressure (!) 170/103, pulse (!) 129, temperature 98.4 F (36.9 C), temperature source Axillary, resp. rate (!) 23, SpO2 95 %.  General appearance: Barely responsive to a vigorous sternal rub-moves/responds to pain by moving  right upper and right lower extremity.  Does not move left upper and lower extremity as fluently as the right. Eyes:, pupils equally reactive to light and accomodation HEENT: Atraumatic and Normocephalic Neck: supple Resp:Good air entry bilaterally, no added sounds  CVS: S1 S2 regular, no murmurs.  GI: Bowel sounds present, Non tender and not  distended with no gaurding, rigidity or rebound. Extremities: B/L Lower Ext shows no edema, both legs are warm to touch Neurology: Unable to evaluate properly-however seems to withdraw/move right upper and lower extremity with vigorous painful stimuli-hardly any response on the left side. Musculoskeletal:gait appears to be normal.No digital cyanosis Skin:No Rash, warm and dry Wounds:N/A  LABS ON ADMISSION:  I have personally reviewed following labs and imaging studies  CBC: Recent Labs  Lab 01/02/19 1127  WBC 11.3*  NEUTROABS 8.5*  HGB 14.9  HCT 46.6*  MCV 92.5  PLT 247    Basic Metabolic Panel: Recent Labs  Lab 01/02/19 1127 01/02/19 1526  NA 137  --   K 4.2  --   CL 98  --   CO2 25  --   GLUCOSE 118*  --   BUN 20  --   CREATININE 1.01*  --   CALCIUM 10.0  --   MG  --  2.0  PHOS  --  2.5    GFR: CrCl cannot be calculated (Unknown ideal weight.).  Liver Function Tests: Recent Labs  Lab 01/02/19 1127  AST 34  ALT 16  ALKPHOS 101  BILITOT 0.7  PROT 8.1  ALBUMIN 3.2*   No results for input(s): LIPASE, AMYLASE in the last 168 hours. Recent Labs  Lab 01/02/19 1526  AMMONIA 15    Coagulation Profile: No results for input(s): INR, PROTIME in the last 168 hours.  Cardiac Enzymes: Recent Labs  Lab 01/02/19 1127  CKTOTAL 534*    BNP (last 3 results) No results for input(s): PROBNP in the last 8760 hours.  HbA1C: No results for input(s): HGBA1C in the last 72 hours.  CBG: Recent Labs  Lab 01/02/19 1134  GLUCAP 97    Lipid Profile: No results for input(s): CHOL, HDL, LDLCALC, TRIG, CHOLHDL, LDLDIRECT in the last 72 hours.  Thyroid Function Tests: Recent Labs    01/02/19 1526  TSH 1.272    Anemia Panel: No results for input(s): VITAMINB12, FOLATE, FERRITIN, TIBC, IRON, RETICCTPCT in the last 72 hours.  Urine analysis:    Component Value Date/Time   COLORURINE YELLOW 01/02/2019 1127   APPEARANCEUR CLEAR 01/02/2019 1127   LABSPEC  1.021 01/02/2019 1127   PHURINE 5.0 01/02/2019 1127   GLUCOSEU NEGATIVE 01/02/2019 1127   HGBUR SMALL (A) 01/02/2019 1127   BILIRUBINUR NEGATIVE 01/02/2019 1127   KETONESUR 5 (A) 01/02/2019 1127   PROTEINUR 100 (A) 01/02/2019 1127   UROBILINOGEN 1.0 10/01/2013 0227   NITRITE NEGATIVE 01/02/2019 1127   LEUKOCYTESUR NEGATIVE 01/02/2019 1127    Sepsis Labs: Lactic Acid, Venous    Component Value Date/Time   LATICACIDVEN 1.6 01/02/2019 1358     Microbiology: No results found for this or any previous visit (from the past 240 hour(s)).    RADIOLOGIC STUDIES ON ADMISSION: Ct Angio Head W Or Wo Contrast  Result Date: 01/02/2019 CLINICAL DATA:  68 year old female found down with right side gaze. EXAM: CT ANGIOGRAPHY HEAD AND NECK CT PERFUSION BRAIN TECHNIQUE: Multidetector CT imaging of the head and neck was performed using the standard protocol during bolus administration of intravenous contrast. Multiplanar CT image reconstructions and MIPs  were obtained to evaluate the vascular anatomy. Carotid stenosis measurements (when applicable) are obtained utilizing NASCET criteria, using the distal internal carotid diameter as the denominator. Multiphase CT imaging of the brain was performed following IV bolus contrast injection. Subsequent parametric perfusion maps were calculated using RAPID software. CONTRAST:  OMNIPAQUE IOHEXOL 350 MG/ML SOLN COMPARISON:  Head CT without contrast 1245 hours today. Cervical spine CT 01/27/2018. Brain MRI and intracranial MRA 09/28/2013. FINDINGS: CT Brain Perfusion Findings: Intermittently motion degraded. CBF (<30%) Volume: None Perfusion (Tmax>6.0s) volume: None Mismatch Volume: Not applicable Infarction Location:Not applicable CTA NECK Skeleton: Poor dentition. Cervical spine appears stable. No acute osseous abnormality identified. Upper chest: Confluent dependent right upper lobe peribronchial opacity with some consolidation suspected. Visible left lung is  clear. Visible trachea is clear. No superior mediastinal lymphadenopathy. Other neck: Negative. Aortic arch: 3 vessel arch configuration. Mild arch and great vessel origin atherosclerosis. Right carotid system: Mild brachiocephalic artery plaque without stenosis. Tortuous proximal right CCA without stenosis. Mild soft and calcified plaque in the proximal right ICA with tortuosity and no stenosis. Left carotid system: Mildly tortuous proximal left CCA without stenosis. Partially retropharyngeal course of the left CCA. Mild soft and calcified plaque at the left ICA origin and bulb without stenosis. Tortuous cervical left ICA. Vertebral arteries: Minor plaque in the proximal right subclavian artery without stenosis. Normal right vertebral artery origin. The right vertebral is dominant and patent to the skull base without stenosis. Mild plaque in the proximal left subclavian artery without stenosis. Mild calcified plaque at the left vertebral artery origin without stenosis. Non dominant left vertebral artery is patent to the skull base without stenosis. CTA HEAD Posterior circulation: Dominant right vertebral artery with V4 calcified plaque but no stenosis. Tortuous vertebrobasilar junction. Patent right PICA origin. Tortuous non dominant left vertebral artery with normal PICA origin and no stenosis. 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 Ectatic basilar tip with widely patent SCA and PCA origins. Posterior communicating arteries are diminutive or absent. Left PCA branches are within normal limits. The proximal right PCA is appear normal but there is suggestion of superior division right P3 occlusion on series 11, image 14. but this could be chronic, it is unclear whether this was present on the 2014 MRA. Anterior circulation: Both ICA siphons are patent. On the left there is moderate calcified plaque, and moderate to severe supraclinoid left ICA stenosis due to calcified plaque seen  on series 6, image 218. Normal left ophthalmic artery origin. On the right there is abundant similar calcified plaque, also with moderate to severe supraclinoid ICA stenosis. See series 8, images 94 and 95. Normal right ophthalmic artery origin. Patent carotid termini, normal MCA and ACA origins. Tortuous proximal ACAs. Anterior communicating artery is diminutive or absent. ACA branches are within normal limits. Left MCA M1 segment and bifurcation are patent without stenosis. Left MCA branches are within normal limits. Right MCA M1 is also tortuous. Right MCA bifurcation is patent without stenosis. Right MCA branches appear stable since the 2014 MRA. Venous sinuses: Early contrast timing but the major dural venous sinuses appear grossly patent. Anatomic variants: Dominant right vertebral artery. Review of the MIP images confirms the above findings IMPRESSION: 1. Negative for large vessel occlusion, and no core infarct or ischemic penumbra detected by CTP. 2. Questionable distal Right PCA P3 superior division occlusion. This might be chronic. 3. Generalized arterial tortuosity with no stenosis in the neck or the posterior circulation, but Moderate to Severe Bilateral  Supraclinoid ICA stenosis due to bulky calcified plaque. 4. Right Upper Lobe Pneumonia partially visible. These results were communicated to Dr. Laurence Slate at 1:27 pmon 3/5/2020by text page via the [facility] messaging system. Electronically Signed   By: Odessa Fleming M.D.   On: 01/02/2019 13:27   Ct Angio Neck W Or Wo Contrast  Result Date: 01/02/2019 CLINICAL DATA:  67 year old female found down with right side gaze. EXAM: CT ANGIOGRAPHY HEAD AND NECK CT PERFUSION BRAIN TECHNIQUE: Multidetector CT imaging of the head and neck was performed using the standard protocol during bolus administration of intravenous contrast. Multiplanar CT image reconstructions and MIPs were obtained to evaluate the vascular anatomy. Carotid stenosis measurements (when applicable) are  obtained utilizing NASCET criteria, using the distal internal carotid diameter as the denominator. Multiphase CT imaging of the brain was performed following IV bolus contrast injection. Subsequent parametric perfusion maps were calculated using RAPID software. CONTRAST:  OMNIPAQUE IOHEXOL 350 MG/ML SOLN COMPARISON:  Head CT without contrast 1245 hours today. Cervical spine CT 01/27/2018. Brain MRI and intracranial MRA 09/28/2013. FINDINGS: CT Brain Perfusion Findings: Intermittently motion degraded. CBF (<30%) Volume: None Perfusion (Tmax>6.0s) volume: None Mismatch Volume: Not applicable Infarction Location:Not applicable CTA NECK Skeleton: Poor dentition. Cervical spine appears stable. No acute osseous abnormality identified. Upper chest: Confluent dependent right upper lobe peribronchial opacity with some consolidation suspected. Visible left lung is clear. Visible trachea is clear. No superior mediastinal lymphadenopathy. Other neck: Negative. Aortic arch: 3 vessel arch configuration. Mild arch and great vessel origin atherosclerosis. Right carotid system: Mild brachiocephalic artery plaque without stenosis. Tortuous proximal right CCA without stenosis. Mild soft and calcified plaque in the proximal right ICA with tortuosity and no stenosis. Left carotid system: Mildly tortuous proximal left CCA without stenosis. Partially retropharyngeal course of the left CCA. Mild soft and calcified plaque at the left ICA origin and bulb without stenosis. Tortuous cervical left ICA. Vertebral arteries: Minor plaque in the proximal right subclavian artery without stenosis. Normal right vertebral artery origin. The right vertebral is dominant and patent to the skull base without stenosis. Mild plaque in the proximal left subclavian artery without stenosis. Mild calcified plaque at the left vertebral artery origin without stenosis. Non dominant left vertebral artery is patent to the skull base without stenosis. CTA HEAD  Posterior circulation: Dominant right vertebral artery with V4 calcified plaque but no stenosis. Tortuous vertebrobasilar junction. Patent right PICA origin. Tortuous non dominant left vertebral artery with normal PICA origin and no stenosis. 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 Ectatic basilar tip with widely patent SCA and PCA origins. Posterior communicating arteries are diminutive or absent. Left PCA branches are within normal limits. The proximal right PCA is appear normal but there is suggestion of superior division right P3 occlusion on series 11, image 14. but this could be chronic, it is unclear whether this was present on the 2014 MRA. Anterior circulation: Both ICA siphons are patent. On the left there is moderate calcified plaque, and moderate to severe supraclinoid left ICA stenosis due to calcified plaque seen on series 6, image 218. Normal left ophthalmic artery origin. On the right there is abundant similar calcified plaque, also with moderate to severe supraclinoid ICA stenosis. See series 8, images 94 and 95. Normal right ophthalmic artery origin. Patent carotid termini, normal MCA and ACA origins. Tortuous proximal ACAs. Anterior communicating artery is diminutive or absent. ACA branches are within normal limits. Left MCA M1 segment and bifurcation are patent without stenosis.  Left MCA branches are within normal limits. Right MCA M1 is also tortuous. Right MCA bifurcation is patent without stenosis. Right MCA branches appear stable since the 2014 MRA. Venous sinuses: Early contrast timing but the major dural venous sinuses appear grossly patent. Anatomic variants: Dominant right vertebral artery. Review of the MIP images confirms the above findings IMPRESSION: 1. Negative for large vessel occlusion, and no core infarct or ischemic penumbra detected by CTP. 2. Questionable distal Right PCA P3 superior division occlusion. This might be chronic. 3. Generalized  arterial tortuosity with no stenosis in the neck or the posterior circulation, but Moderate to Severe Bilateral Supraclinoid ICA stenosis due to bulky calcified plaque. 4. Right Upper Lobe Pneumonia partially visible. These results were communicated to Dr. Laurence Slate at 1:27 pmon 3/5/2020by text page via the [facility] messaging system. Electronically Signed   By: Odessa Fleming M.D.   On: 01/02/2019 13:27   Ct C-spine No Charge  Result Date: 01/02/2019 CLINICAL DATA:  Found on the floor. Right sided gaze. Decreased responsiveness. EXAM: CT CERVICAL SPINE WITHOUT CONTRAST TECHNIQUE: Multidetector CT imaging of the cervical spine was performed without intravenous contrast. Multiplanar CT image reconstructions were also generated. COMPARISON:  01/27/2018. Head and neck CTA obtained today. FINDINGS: Alignment: Normal. Skull base and vertebrae: No acute fracture. No primary bone lesion or focal pathologic process. Soft tissues and spinal canal: No prevertebral fluid or swelling. No visible canal hematoma. Disc levels:  Mild multilevel degenerative changes. Upper chest: Again demonstrated is patchy and dense consolidation in the posterior aspect of the right upper lobe. Other: Normal appearing thyroid gland. Intravascular contrast from the recent CTA. IMPRESSION: 1. No cervical spine fracture or subluxation. 2. Mild multilevel degenerative changes. 3. Stable patchy and dense consolidation in the posterior aspect of the right upper lobe. This has an appearance compatible with pneumonia, possibly due to aspiration. Electronically Signed   By: Beckie Salts M.D.   On: 01/02/2019 14:20   Ct Cerebral Perfusion W Contrast  Result Date: 01/02/2019 CLINICAL DATA:  68 year old female found down with right side gaze. EXAM: CT ANGIOGRAPHY HEAD AND NECK CT PERFUSION BRAIN TECHNIQUE: Multidetector CT imaging of the head and neck was performed using the standard protocol during bolus administration of intravenous contrast. Multiplanar CT image  reconstructions and MIPs were obtained to evaluate the vascular anatomy. Carotid stenosis measurements (when applicable) are obtained utilizing NASCET criteria, using the distal internal carotid diameter as the denominator. Multiphase CT imaging of the brain was performed following IV bolus contrast injection. Subsequent parametric perfusion maps were calculated using RAPID software. CONTRAST:  OMNIPAQUE IOHEXOL 350 MG/ML SOLN COMPARISON:  Head CT without contrast 1245 hours today. Cervical spine CT 01/27/2018. Brain MRI and intracranial MRA 09/28/2013. FINDINGS: CT Brain Perfusion Findings: Intermittently motion degraded. CBF (<30%) Volume: None Perfusion (Tmax>6.0s) volume: None Mismatch Volume: Not applicable Infarction Location:Not applicable CTA NECK Skeleton: Poor dentition. Cervical spine appears stable. No acute osseous abnormality identified. Upper chest: Confluent dependent right upper lobe peribronchial opacity with some consolidation suspected. Visible left lung is clear. Visible trachea is clear. No superior mediastinal lymphadenopathy. Other neck: Negative. Aortic arch: 3 vessel arch configuration. Mild arch and great vessel origin atherosclerosis. Right carotid system: Mild brachiocephalic artery plaque without stenosis. Tortuous proximal right CCA without stenosis. Mild soft and calcified plaque in the proximal right ICA with tortuosity and no stenosis. Left carotid system: Mildly tortuous proximal left CCA without stenosis. Partially retropharyngeal course of the left CCA. Mild soft and calcified plaque at the  left ICA origin and bulb without stenosis. Tortuous cervical left ICA. Vertebral arteries: Minor plaque in the proximal right subclavian artery without stenosis. Normal right vertebral artery origin. The right vertebral is dominant and patent to the skull base without stenosis. Mild plaque in the proximal left subclavian artery without stenosis. Mild calcified plaque at the left  vertebral artery origin without stenosis. Non dominant left vertebral artery is patent to the skull base without stenosis. CTA HEAD Posterior circulation: Dominant right vertebral artery with V4 calcified plaque but no stenosis. Tortuous vertebrobasilar junction. Patent right PICA origin. Tortuous non dominant left vertebral artery with normal PICA origin and no stenosis. 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 Ectatic basilar tip with widely patent SCA and PCA origins. Posterior communicating arteries are diminutive or absent. Left PCA branches are within normal limits. The proximal right PCA is appear normal but there is suggestion of superior division right P3 occlusion on series 11, image 14. but this could be chronic, it is unclear whether this was present on the 2014 MRA. Anterior circulation: Both ICA siphons are patent. On the left there is moderate calcified plaque, and moderate to severe supraclinoid left ICA stenosis due to calcified plaque seen on series 6, image 218. Normal left ophthalmic artery origin. On the right there is abundant similar calcified plaque, also with moderate to severe supraclinoid ICA stenosis. See series 8, images 94 and 95. Normal right ophthalmic artery origin. Patent carotid termini, normal MCA and ACA origins. Tortuous proximal ACAs. Anterior communicating artery is diminutive or absent. ACA branches are within normal limits. Left MCA M1 segment and bifurcation are patent without stenosis. Left MCA branches are within normal limits. Right MCA M1 is also tortuous. Right MCA bifurcation is patent without stenosis. Right MCA branches appear stable since the 2014 MRA. Venous sinuses: Early contrast timing but the major dural venous sinuses appear grossly patent. Anatomic variants: Dominant right vertebral artery. Review of the MIP images confirms the above findings IMPRESSION: 1. Negative for large vessel occlusion, and no core infarct or ischemic  penumbra detected by CTP. 2. Questionable distal Right PCA P3 superior division occlusion. This might be chronic. 3. Generalized arterial tortuosity with no stenosis in the neck or the posterior circulation, but Moderate to Severe Bilateral Supraclinoid ICA stenosis due to bulky calcified plaque. 4. Right Upper Lobe Pneumonia partially visible. These results were communicated to Dr. Laurence Slate at 1:27 pmon 3/5/2020by text page via the [facility] messaging system. Electronically Signed   By: Odessa Fleming M.D.   On: 01/02/2019 13:27   Dg Chest Portable 1 View  Result Date: 01/02/2019 CLINICAL DATA:  Initial evaluation for possible infection. EXAM: PORTABLE CHEST 1 VIEW COMPARISON:  Prior radiograph from 01/27/2018. FINDINGS: Cardiomegaly, stable. Mediastinal silhouette within normal limits. Tortuosity the intrathoracic aorta noted. Lungs are hypoinflated. Secondary mild bibasilar bronchovascular crowding. No consolidative opacity. No edema or effusion. No pneumothorax. No acute osseous finding. IMPRESSION: Shallow lung inflation with no active cardiopulmonary disease identified. Electronically Signed   By: Rise Mu M.D.   On: 01/02/2019 14:51   Ct Head Code Stroke Wo Contrast`  Result Date: 01/02/2019 CLINICAL DATA:  Code stroke. 67 year old female found down today with rightward gaze. EXAM: CT HEAD WITHOUT CONTRAST TECHNIQUE: Contiguous axial images were obtained from the base of the skull through the vertex without intravenous contrast. COMPARISON:  Head CT 01/27/2018 and earlier. FINDINGS: Brain: Advanced chronic cerebral white matter hypodensity and previous infarct of the right basal ganglia with ex  vacuo ventricular enlargement. No acute intracranial hemorrhage identified. White matter hypodensity appears increased since 2019 bilaterally, but no acute cortically based infarct is identified. However, hypodensity in the right thalamus appears increased on series 3, image 16. Elsewhere stable deep gray matter  nuclei. No midline shift, mass effect, or evidence of intracranial mass lesion. Vascular: Calcified atherosclerosis at the skull base. No suspicious intracranial vascular hyperdensity. Skull: No acute osseous abnormality identified. Sinuses/Orbits: Mild paranasal sinus mucosal thickening is stable since 2019. Tympanic cavities and mastoids are clear. Other: No acute orbit or scalp soft tissue finding. ASPECTS [facility] Stroke Program Early CT Score) - Ganglionic level infarction (caudate, lentiform nuclei, internal capsule, insula, M1-M3 cortex): 7 - Supraganglionic infarction (M4-M6 cortex): 3 Total score (0-10 with 10 being normal): 10 IMPRESSION: 1. 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 2. No acute cortically based infarct or acute intracranial hemorrhage identified. 3. These results were communicated to Dr. Laurence Slate at 1:05 pm on 01/02/2019 by text page via the [facility] messaging system. Electronically Signed   By: Odessa Fleming M.D.   On: 01/02/2019 13:05     EKG:  Personally reviewed.  Sinus tachycardia  ASSESSMENT AND PLAN: Acute encephalopathy: Concern for status epilepticus-as EEG positive-and patient continues to be very altered.  At this point-protecting airway-overnight/long-term EEG in progress.  Neurology consultation obtained-has been loaded with IV Keppra-plans are to load fosphenytoin as well.  Patient will also be maintained on Keppra and Dilantin.  Will await further recommendations from neurology.  Left-sided hemiparesis: Differentials are broad at this point-but concern for either Todd's paresis or CVA.  Awaiting MRI brain.  CT imaging so far negative for CVA.  In the interim, start aspirin until we rule out CVA.  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  Sepsis secondary to aspiration pneumonia: CT neuroimaging revealed right upper lobe pneumonia-high  suspicion for aspiration in this clinical scenario.  We will start Unasyn-follow cultures, obtain influenza PCR.  Hypertension: Allow permissive hypertension-until we rule out acute CVA.  Hold all oral antihypertensives for now-as patient's mental status will not allow for any sort of oral intake.  Dyslipidemia: Hold statin until mental status improves  Further plan will depend as patient's clinical course evolves and further radiologic and laboratory data become available. Patient will be monitored closely.  Above noted plan was discussed with patient's daughter over the phone-she was in agreement.    CONSULTS: None  DVT Prophylaxis: Prophylactic Lovenox   Code Status: Full Code  Disposition Plan:  Discharge back home vs SNF possibly in 3-4 days, depending on clinical course  Admission status: Inpatient going to  SDU  The patient is critically ill with multiple organ system failure and requires high complexity decision making for assessment and support, frequent evaluation and titration of therapies, advanced monitoring, review of radiographic studies and interpretation of complex data.  Total time spent  55 minutes.Greater than 50% of this time was spent in counseling, explanation of diagnosis, planning of further management, and coordination of care.  Severity of illness: The appropriate patient status for this patient is INPATIENT. Inpatient status is judged to be reasonable and necessary in order to provide the required intensity of service to ensure the patient's safety. The patient's presenting symptoms, physical exam findings, and initial radiographic and laboratory data in the context of their chronic comorbidities is felt to place them at high  risk for further clinical deterioration. Furthermore, it is not anticipated that the patient will be medically stable for discharge from the hospital within 2 midnights of admission. The following factors support the patient status of  inpatient.   " The patient's presenting symptoms include confusion, hypothermia, left-sided weakness " The worrisome physical exam findings include left-sided hemiparesis, right gaze preference " The initial radiographic and laboratory data are worrisome because of right upper lobe pneumonia " The chronic co-morbidities include prior CVA, hypertension, dyslipidemia   * I certify that at the point of admission it is my clinical judgment that the patient will require inpatient hospital care spanning beyond 2 midnights from the point of admission due to high intensity of service, high risk for further deterioration and high frequency of surveillance required.**  Murle Otting [facility] Pager [phone]  If 7PM-7AM, please contact night-coverage  Please page via [email or web address]  Go to [email or web address] and use [facility] universal password to access. If you do not have the password, please contact the hospital operator.  Locate the [facility] provider you are looking for under [facility] and page to a number that you can be directly reached. If you still have difficulty reaching the provider, please page the [facility] (Director on Call) for the Hospitalists listed on amion for assistance.  01/02/2019, 4:42 PM

## 2019-01-02 NOTE — ED Notes (Signed)
Erika Wang [phone] [name] [phone]

## 2019-01-02 NOTE — Progress Notes (Signed)
MEDICATION RELATED CONSULT NOTE - INITIAL   Pharmacy Consult for phenytoin maintenance dosing Indication: seizure-like activity  Allergies  Allergen Reactions  . Ace Inhibitors Other (See Comments)    Acute renal failure    Patient Measurements: Height: 5' (152.4 cm) Weight: 130 lb (59 kg) IBW/kg (Calculated) : 45.5  Vital Signs: Temp: 98.4 F (36.9 C) (03/05 1446) Temp Source: Axillary (03/05 1446) BP: 140/83 (03/05 1715) Pulse Rate: 124 (03/05 1715) Intake/Output from previous day: No intake/output data recorded. Intake/Output from this shift: No intake/output data recorded.  Labs: Recent Labs    01/02/19 1127 01/02/19 1526  WBC 11.3*  --   HGB 14.9  --   HCT 46.6*  --   PLT 247  --   CREATININE 1.01*  --   MG  --  2.0  PHOS  --  2.5  ALBUMIN 3.2*  --   PROT 8.1  --   AST 34  --   ALT 16  --   ALKPHOS 101  --   BILITOT 0.7  --    Estimated Creatinine Clearance: 44.6 mL/min (A) (by C-G formula based on SCr of 1.01 mg/dL (H)).   Microbiology: No results found for this or any previous visit (from the past 720 hour(s)).  Medical History: Past Medical History:  Diagnosis Date  . High cholesterol   . Hypertension   . Stroke [facility])     Medications:  Scheduled:  . LORazepam       Infusions:  . sodium chloride 125 mL/hr at 01/02/19 1645  . ampicillin-sulbactam (UNASYN) IV    . fosPHENYtoin (CEREBYX) IV    . [START ON 01/03/2019] levETIRAcetam      Assessment: 67 yoF admitted 3/5 unresponsive with AMS. EEG (preliminary report) was positive for seizure activity so patient initiated on fosphenytoin load followed by phenytoin maintenance dosing. Pharmacy has been consulted for phenytoin dosing.   Plan:  Start phenytoin 75 mg IV q8h (total daily dose of 225 mg is ~5 mg/kg of IBW) Obtain phenytoin level in 24 hours after fosphenytoin dose to ensure appropriate load Obtain a phenytoin level 5-7 days after therapy initiation for adjustments A true steady  state is not reached for 10-14 days.   Thank you for allowing pharmacy to be a part of this patient's care.  Lenord Carbo, PharmD PGY1 Pharmacy Resident  Please check AMION for all [facility] Pharmacy phone numbers 01/02/2019,5:45 PM

## 2019-01-02 NOTE — ED Notes (Signed)
ED TO INPATIENT HANDOFF REPORT  ED Nurse Name and Phone #: Kyndell Zeiser [phone]  S Name/Age/Gender Erika Wang 68 y.o. female Room/Bed: 020C/020C  Code Status   Code Status: Prior  Home/SNF/Other Home Patient oriented to: self Is this baseline? No   Triage Complete: Triage complete  Chief Complaint found on floor rt sided gaze  Triage Note PT here via EMS from her home where her neighbors saw her out walking two days ago but no one has seen her since. Daughter went to check on her today and found her on the floor. Pt has right sided gaze. Pt less responsive than usual. Will respond with "yes" or "no" only. C collar in place.    Allergies Allergies  Allergen Reactions  . Ace Inhibitors Other (See Comments)    Acute renal failure    Level of Care/Admitting Diagnosis ED Disposition    ED Disposition Condition Comment   Admit  Hospital Area: [facility] [[id number]]  Level of Care: Progressive [102]  Diagnosis: Encephalopathy acute [454098]  Admitting Physician: Maretta Bees [[id number]]  Attending Physician: Maretta Bees [[id number]]  Estimated length of stay: past midnight tomorrow  Certification:: I certify this patient will need inpatient services for at least 2 midnights  Bed request comments: prefer 5 west  PT Class (Do Not Modify): Inpatient [101]  PT Acc Code (Do Not Modify): Private [1]       B Medical/Surgery History Past Medical History:  Diagnosis Date  . High cholesterol   . Hypertension   . Stroke [facility])    Past Surgical History:  Procedure Laterality Date  . CESAREAN SECTION       A IV Location/Drains/Wounds Patient Lines/Drains/Airways Status   Active Line/Drains/Airways    Name:   Placement date:   Placement time:   Site:   Days:   Peripheral IV 01/02/19 Left Antecubital   01/02/19    1109    Antecubital   less than 1   Peripheral IV 01/02/19 Right Forearm   01/02/19    1155    Forearm   less than 1           Intake/Output Last 24 hours No intake or output data in the 24 hours ending 01/02/19 1647  Labs/Imaging Results for orders placed or performed during the hospital encounter of 01/02/19 (from the past 48 hour(s))  CBC WITH DIFFERENTIAL     Status: Abnormal   Collection Time: 01/02/19 11:27 AM  Result Value Ref Range   WBC 11.3 (H) 4.0 - 10.5 K/uL   RBC 5.04 3.87 - 5.11 MIL/uL   Hemoglobin 14.9 12.0 - 15.0 g/dL   HCT 11.9 (H) 14.7 - 82.9 %   MCV 92.5 80.0 - 100.0 fL   MCH 29.6 26.0 - 34.0 pg   MCHC 32.0 30.0 - 36.0 g/dL   RDW 56.2 13.0 - 86.5 %   Platelets 247 150 - 400 K/uL   nRBC 0.0 0.0 - 0.2 %   Neutrophils Relative % 76 %   Neutro Abs 8.5 (H) 1.7 - 7.7 K/uL   Lymphocytes Relative 13 %   Lymphs Abs 1.4 0.7 - 4.0 K/uL   Monocytes Relative 11 %   Monocytes Absolute 1.3 (H) 0.1 - 1.0 K/uL   Eosinophils Relative 0 %   Eosinophils Absolute 0.0 0.0 - 0.5 K/uL   Basophils Relative 0 %   Basophils Absolute 0.0 0.0 - 0.1 K/uL   Immature Granulocytes 0 %   Abs Immature Granulocytes 0.04  0.00 - 0.07 K/uL    Comment: Performed at [facility], [address], [place], [place] [address]  Comprehensive metabolic panel     Status: Abnormal   Collection Time: 01/02/19 11:27 AM  Result Value Ref Range   Sodium 137 135 - 145 mmol/L   Potassium 4.2 3.5 - 5.1 mmol/L   Chloride 98 98 - 111 mmol/L   CO2 25 22 - 32 mmol/L   Glucose, Bld 118 (H) 70 - 99 mg/dL   BUN 20 8 - 23 mg/dL   Creatinine, Ser 1.19 (H) 0.44 - 1.00 mg/dL   Calcium 14.7 8.9 - 82.9 mg/dL   Total Protein 8.1 6.5 - 8.1 g/dL   Albumin 3.2 (L) 3.5 - 5.0 g/dL   AST 34 15 - 41 U/L   ALT 16 0 - 44 U/L   Alkaline Phosphatase 101 38 - 126 U/L   Total Bilirubin 0.7 0.3 - 1.2 mg/dL   GFR calc non Af Amer 58 (L) >60 mL/min   GFR calc Af Amer >60 >60 mL/min   Anion gap 14 5 - 15    Comment: Performed at [facility], [address] [place], [place] [address]  Ethanol/ETOH     Status: None   Collection Time:  01/02/19 11:27 AM  Result Value Ref Range   Alcohol, Ethyl (B) <10 <10 mg/dL    Comment: (NOTE) Lowest detectable limit for serum alcohol is 10 mg/dL. For medical purposes only. Performed at [facility], [address] [place], [place] [address]   Urine rapid drug screen (hosp performed)     Status: None   Collection Time: 01/02/19 11:27 AM  Result Value Ref Range   Opiates NONE DETECTED NONE DETECTED   Cocaine NONE DETECTED NONE DETECTED   Benzodiazepines NONE DETECTED NONE DETECTED   Amphetamines NONE DETECTED NONE DETECTED   Tetrahydrocannabinol NONE DETECTED NONE DETECTED   Barbiturates NONE DETECTED NONE DETECTED    Comment: (NOTE) DRUG SCREEN FOR MEDICAL PURPOSES ONLY.  IF CONFIRMATION IS NEEDED FOR ANY PURPOSE, NOTIFY LAB WITHIN 5 DAYS. LOWEST DETECTABLE LIMITS FOR URINE DRUG SCREEN Drug Class                     Cutoff (ng/mL) Amphetamine and metabolites    1000 Barbiturate and metabolites    200 Benzodiazepine                 200 Tricyclics and metabolites     300 Opiates and metabolites        300 Cocaine and metabolites        300 THC                            50 Performed at [facility], [address], [place], [place] [address]   Urinalysis, Routine w reflex microscopic     Status: Abnormal   Collection Time: 01/02/19 11:27 AM  Result Value Ref Range   Color, Urine YELLOW YELLOW   APPearance CLEAR CLEAR   Specific Gravity, Urine 1.021 1.005 - 1.030   pH 5.0 5.0 - 8.0   Glucose, UA NEGATIVE NEGATIVE mg/dL   Hgb urine dipstick SMALL (A) NEGATIVE   Bilirubin Urine NEGATIVE NEGATIVE   Ketones, ur 5 (A) NEGATIVE mg/dL   Protein, ur 295 (A) NEGATIVE mg/dL   Nitrite NEGATIVE NEGATIVE   Leukocytes,Ua NEGATIVE NEGATIVE   RBC / HPF 0-5  0 - 5 RBC/hpf   WBC, UA 11-20 0 - 5 WBC/hpf   Bacteria, UA NONE SEEN NONE SEEN   Squamous Epithelial / LPF 0-5 0 - 5    Comment: Performed at [facility], [address] [place], [place] [address]  CK      Status: Abnormal   Collection Time: 01/02/19 11:27 AM  Result Value Ref Range   Total CK 534 (H) 38 - 234 U/L    Comment: Performed at [facility], [address] [place], [place] [address]  CBG monitoring, ED     Status: None   Collection Time: 01/02/19 11:34 AM  Result Value Ref Range   Glucose-Capillary 97 70 - 99 mg/dL  Lactic acid, plasma     Status: None   Collection Time: 01/02/19  1:58 PM  Result Value Ref Range   Lactic Acid, Venous 1.6 0.5 - 1.9 mmol/L    Comment: Performed at [facility], [address] [place], [place] [address]  Ammonia     Status: None   Collection Time: 01/02/19  3:26 PM  Result Value Ref Range   Ammonia 15 9 - 35 umol/L    Comment: Performed at [facility], [address], [place], [place] [address]  TSH     Status: None   Collection Time: 01/02/19  3:26 PM  Result Value Ref Range   TSH 1.272 0.350 - 4.500 uIU/mL    Comment: Performed by a 3rd Generation assay with a functional sensitivity of <=0.01 uIU/mL. Performed at [facility], [address], [place], [place] [address]   Magnesium     Status: None   Collection Time: 01/02/19  3:26 PM  Result Value Ref Range   Magnesium 2.0 1.7 - 2.4 mg/dL    Comment: Performed at [facility], [address] [place], [place] [address]  Phosphorus     Status: None   Collection Time: 01/02/19  3:26 PM  Result Value Ref Range   Phosphorus 2.5 2.5 - 4.6 mg/dL    Comment: Performed at [facility], [address], [place], [place] [address]   Ct Angio Head W Or Wo Contrast  Result Date: 01/02/2019 CLINICAL DATA:  68 year old female found down with right side gaze. EXAM: CT ANGIOGRAPHY HEAD AND NECK CT PERFUSION BRAIN TECHNIQUE: Multidetector CT imaging of the head and neck was performed using the standard protocol during bolus administration of intravenous contrast. Multiplanar CT image reconstructions and MIPs were obtained to evaluate the vascular anatomy. Carotid  stenosis measurements (when applicable) are obtained utilizing NASCET criteria, using the distal internal carotid diameter as the denominator. Multiphase CT imaging of the brain was performed following IV bolus contrast injection. Subsequent parametric perfusion maps were calculated using RAPID software. CONTRAST:  OMNIPAQUE IOHEXOL 350 MG/ML SOLN COMPARISON:  Head CT without contrast 1245 hours today. Cervical spine CT 01/27/2018. Brain MRI and intracranial MRA 09/28/2013. FINDINGS: CT Brain Perfusion Findings: Intermittently motion degraded. CBF (<30%) Volume: None Perfusion (Tmax>6.0s) volume: None Mismatch Volume: Not applicable Infarction Location:Not applicable CTA NECK Skeleton: Poor dentition. Cervical spine appears stable. No acute osseous abnormality identified. Upper chest: Confluent dependent right upper lobe peribronchial opacity with some consolidation suspected. Visible left lung is clear. Visible trachea is clear. No superior mediastinal lymphadenopathy. Other neck: Negative. Aortic arch: 3 vessel arch configuration. Mild arch and great vessel origin atherosclerosis. Right carotid system: Mild brachiocephalic artery plaque without stenosis. Tortuous proximal  right CCA without stenosis. Mild soft and calcified plaque in the proximal right ICA with tortuosity and no stenosis. Left carotid system: Mildly tortuous proximal left CCA without stenosis. Partially retropharyngeal course of the left CCA. Mild soft and calcified plaque at the left ICA origin and bulb without stenosis. Tortuous cervical left ICA. Vertebral arteries: Minor plaque in the proximal right subclavian artery without stenosis. Normal right vertebral artery origin. The right vertebral is dominant and patent to the skull base without stenosis. Mild plaque in the proximal left subclavian artery without stenosis. Mild calcified plaque at the left vertebral artery origin without stenosis. Non dominant left vertebral artery is patent to  the skull base without stenosis. CTA HEAD Posterior circulation: Dominant right vertebral artery with V4 calcified plaque but no stenosis. Tortuous vertebrobasilar junction. Patent right PICA origin. Tortuous non dominant left vertebral artery with normal PICA origin and no stenosis. 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 Ectatic basilar tip with widely patent SCA and PCA origins. Posterior communicating arteries are diminutive or absent. Left PCA branches are within normal limits. The proximal right PCA is appear normal but there is suggestion of superior division right P3 occlusion on series 11, image 14. but this could be chronic, it is unclear whether this was present on the 2014 MRA. Anterior circulation: Both ICA siphons are patent. On the left there is moderate calcified plaque, and moderate to severe supraclinoid left ICA stenosis due to calcified plaque seen on series 6, image 218. Normal left ophthalmic artery origin. On the right there is abundant similar calcified plaque, also with moderate to severe supraclinoid ICA stenosis. See series 8, images 94 and 95. Normal right ophthalmic artery origin. Patent carotid termini, normal MCA and ACA origins. Tortuous proximal ACAs. Anterior communicating artery is diminutive or absent. ACA branches are within normal limits. Left MCA M1 segment and bifurcation are patent without stenosis. Left MCA branches are within normal limits. Right MCA M1 is also tortuous. Right MCA bifurcation is patent without stenosis. Right MCA branches appear stable since the 2014 MRA. Venous sinuses: Early contrast timing but the major dural venous sinuses appear grossly patent. Anatomic variants: Dominant right vertebral artery. Review of the MIP images confirms the above findings IMPRESSION: 1. Negative for large vessel occlusion, and no core infarct or ischemic penumbra detected by CTP. 2. Questionable distal Right PCA P3 superior division occlusion.  This might be chronic. 3. Generalized arterial tortuosity with no stenosis in the neck or the posterior circulation, but Moderate to Severe Bilateral Supraclinoid ICA stenosis due to bulky calcified plaque. 4. Right Upper Lobe Pneumonia partially visible. These results were communicated to Dr. Laurence Slate at 1:27 pmon 3/5/2020by text page via the [facility] messaging system. Electronically Signed   By: Odessa Fleming M.D.   On: 01/02/2019 13:27   Ct Angio Neck W Or Wo Contrast  Result Date: 01/02/2019 CLINICAL DATA:  68 year old female found down with right side gaze. EXAM: CT ANGIOGRAPHY HEAD AND NECK CT PERFUSION BRAIN TECHNIQUE: Multidetector CT imaging of the head and neck was performed using the standard protocol during bolus administration of intravenous contrast. Multiplanar CT image reconstructions and MIPs were obtained to evaluate the vascular anatomy. Carotid stenosis measurements (when applicable) are obtained utilizing NASCET criteria, using the distal internal carotid diameter as the denominator. Multiphase CT imaging of the brain was performed following IV bolus contrast injection. Subsequent parametric perfusion maps were calculated using RAPID software. CONTRAST:  OMNIPAQUE IOHEXOL 350 MG/ML SOLN COMPARISON:  Head CT without contrast 1245 hours today. Cervical spine CT 01/27/2018. Brain MRI and intracranial MRA 09/28/2013. FINDINGS: CT Brain Perfusion Findings: Intermittently motion degraded. CBF (<30%) Volume: None Perfusion (Tmax>6.0s) volume: None Mismatch Volume: Not applicable Infarction Location:Not applicable CTA NECK Skeleton: Poor dentition. Cervical spine appears stable. No acute osseous abnormality identified. Upper chest: Confluent dependent right upper lobe peribronchial opacity with some consolidation suspected. Visible left lung is clear. Visible trachea is clear. No superior mediastinal lymphadenopathy. Other neck: Negative. Aortic arch: 3 vessel arch configuration. Mild arch and great vessel  origin atherosclerosis. Right carotid system: Mild brachiocephalic artery plaque without stenosis. Tortuous proximal right CCA without stenosis. Mild soft and calcified plaque in the proximal right ICA with tortuosity and no stenosis. Left carotid system: Mildly tortuous proximal left CCA without stenosis. Partially retropharyngeal course of the left CCA. Mild soft and calcified plaque at the left ICA origin and bulb without stenosis. Tortuous cervical left ICA. Vertebral arteries: Minor plaque in the proximal right subclavian artery without stenosis. Normal right vertebral artery origin. The right vertebral is dominant and patent to the skull base without stenosis. Mild plaque in the proximal left subclavian artery without stenosis. Mild calcified plaque at the left vertebral artery origin without stenosis. Non dominant left vertebral artery is patent to the skull base without stenosis. CTA HEAD Posterior circulation: Dominant right vertebral artery with V4 calcified plaque but no stenosis. Tortuous vertebrobasilar junction. Patent right PICA origin. Tortuous non dominant left vertebral artery with normal PICA origin and no stenosis. 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 Ectatic basilar tip with widely patent SCA and PCA origins. Posterior communicating arteries are diminutive or absent. Left PCA branches are within normal limits. The proximal right PCA is appear normal but there is suggestion of superior division right P3 occlusion on series 11, image 14. but this could be chronic, it is unclear whether this was present on the 2014 MRA. Anterior circulation: Both ICA siphons are patent. On the left there is moderate calcified plaque, and moderate to severe supraclinoid left ICA stenosis due to calcified plaque seen on series 6, image 218. Normal left ophthalmic artery origin. On the right there is abundant similar calcified plaque, also with moderate to severe supraclinoid ICA  stenosis. See series 8, images 94 and 95. Normal right ophthalmic artery origin. Patent carotid termini, normal MCA and ACA origins. Tortuous proximal ACAs. Anterior communicating artery is diminutive or absent. ACA branches are within normal limits. Left MCA M1 segment and bifurcation are patent without stenosis. Left MCA branches are within normal limits. Right MCA M1 is also tortuous. Right MCA bifurcation is patent without stenosis. Right MCA branches appear stable since the 2014 MRA. Venous sinuses: Early contrast timing but the major dural venous sinuses appear grossly patent. Anatomic variants: Dominant right vertebral artery. Review of the MIP images confirms the above findings IMPRESSION: 1. Negative for large vessel occlusion, and no core infarct or ischemic penumbra detected by CTP. 2. Questionable distal Right PCA P3 superior division occlusion. This might be chronic. 3. Generalized arterial tortuosity with no stenosis in the neck or the posterior circulation, but Moderate to Severe Bilateral Supraclinoid ICA stenosis due to bulky calcified plaque. 4. Right Upper Lobe Pneumonia partially visible. These results were communicated to Dr. Laurence Slate at 1:27 pmon 3/5/2020by text page via the [facility] messaging system. Electronically Signed   By: Odessa Fleming M.D.   On: 01/02/2019 13:27   Ct C-spine No Charge  Result Date: 01/02/2019 CLINICAL  DATA:  Found on the floor. Right sided gaze. Decreased responsiveness. EXAM: CT CERVICAL SPINE WITHOUT CONTRAST TECHNIQUE: Multidetector CT imaging of the cervical spine was performed without intravenous contrast. Multiplanar CT image reconstructions were also generated. COMPARISON:  01/27/2018. Head and neck CTA obtained today. FINDINGS: Alignment: Normal. Skull base and vertebrae: No acute fracture. No primary bone lesion or focal pathologic process. Soft tissues and spinal canal: No prevertebral fluid or swelling. No visible canal hematoma. Disc levels:  Mild multilevel  degenerative changes. Upper chest: Again demonstrated is patchy and dense consolidation in the posterior aspect of the right upper lobe. Other: Normal appearing thyroid gland. Intravascular contrast from the recent CTA. IMPRESSION: 1. No cervical spine fracture or subluxation. 2. Mild multilevel degenerative changes. 3. Stable patchy and dense consolidation in the posterior aspect of the right upper lobe. This has an appearance compatible with pneumonia, possibly due to aspiration. Electronically Signed   By: Beckie Salts M.D.   On: 01/02/2019 14:20   Ct Cerebral Perfusion W Contrast  Result Date: 01/02/2019 CLINICAL DATA:  68 year old female found down with right side gaze. EXAM: CT ANGIOGRAPHY HEAD AND NECK CT PERFUSION BRAIN TECHNIQUE: Multidetector CT imaging of the head and neck was performed using the standard protocol during bolus administration of intravenous contrast. Multiplanar CT image reconstructions and MIPs were obtained to evaluate the vascular anatomy. Carotid stenosis measurements (when applicable) are obtained utilizing NASCET criteria, using the distal internal carotid diameter as the denominator. Multiphase CT imaging of the brain was performed following IV bolus contrast injection. Subsequent parametric perfusion maps were calculated using RAPID software. CONTRAST:  OMNIPAQUE IOHEXOL 350 MG/ML SOLN COMPARISON:  Head CT without contrast 1245 hours today. Cervical spine CT 01/27/2018. Brain MRI and intracranial MRA 09/28/2013. FINDINGS: CT Brain Perfusion Findings: Intermittently motion degraded. CBF (<30%) Volume: None Perfusion (Tmax>6.0s) volume: None Mismatch Volume: Not applicable Infarction Location:Not applicable CTA NECK Skeleton: Poor dentition. Cervical spine appears stable. No acute osseous abnormality identified. Upper chest: Confluent dependent right upper lobe peribronchial opacity with some consolidation suspected. Visible left lung is clear. Visible trachea is clear. No  superior mediastinal lymphadenopathy. Other neck: Negative. Aortic arch: 3 vessel arch configuration. Mild arch and great vessel origin atherosclerosis. Right carotid system: Mild brachiocephalic artery plaque without stenosis. Tortuous proximal right CCA without stenosis. Mild soft and calcified plaque in the proximal right ICA with tortuosity and no stenosis. Left carotid system: Mildly tortuous proximal left CCA without stenosis. Partially retropharyngeal course of the left CCA. Mild soft and calcified plaque at the left ICA origin and bulb without stenosis. Tortuous cervical left ICA. Vertebral arteries: Minor plaque in the proximal right subclavian artery without stenosis. Normal right vertebral artery origin. The right vertebral is dominant and patent to the skull base without stenosis. Mild plaque in the proximal left subclavian artery without stenosis. Mild calcified plaque at the left vertebral artery origin without stenosis. Non dominant left vertebral artery is patent to the skull base without stenosis. CTA HEAD Posterior circulation: Dominant right vertebral artery with V4 calcified plaque but no stenosis. Tortuous vertebrobasilar junction. Patent right PICA origin. Tortuous non dominant left vertebral artery with normal PICA origin and no stenosis. 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 Ectatic basilar tip with widely patent SCA and PCA origins. Posterior communicating arteries are diminutive or absent. Left PCA branches are within normal limits. The proximal right PCA is appear normal but there is suggestion of superior division right P3 occlusion on series 11, image  14. but this could be chronic, it is unclear whether this was present on the 2014 MRA. Anterior circulation: Both ICA siphons are patent. On the left there is moderate calcified plaque, and moderate to severe supraclinoid left ICA stenosis due to calcified plaque seen on series 6, image 218. Normal left  ophthalmic artery origin. On the right there is abundant similar calcified plaque, also with moderate to severe supraclinoid ICA stenosis. See series 8, images 94 and 95. Normal right ophthalmic artery origin. Patent carotid termini, normal MCA and ACA origins. Tortuous proximal ACAs. Anterior communicating artery is diminutive or absent. ACA branches are within normal limits. Left MCA M1 segment and bifurcation are patent without stenosis. Left MCA branches are within normal limits. Right MCA M1 is also tortuous. Right MCA bifurcation is patent without stenosis. Right MCA branches appear stable since the 2014 MRA. Venous sinuses: Early contrast timing but the major dural venous sinuses appear grossly patent. Anatomic variants: Dominant right vertebral artery. Review of the MIP images confirms the above findings IMPRESSION: 1. Negative for large vessel occlusion, and no core infarct or ischemic penumbra detected by CTP. 2. Questionable distal Right PCA P3 superior division occlusion. This might be chronic. 3. Generalized arterial tortuosity with no stenosis in the neck or the posterior circulation, but Moderate to Severe Bilateral Supraclinoid ICA stenosis due to bulky calcified plaque. 4. Right Upper Lobe Pneumonia partially visible. These results were communicated to Dr. Laurence Slate at 1:27 pmon 3/5/2020by text page via the [facility] messaging system. Electronically Signed   By: Odessa Fleming M.D.   On: 01/02/2019 13:27   Dg Chest Portable 1 View  Result Date: 01/02/2019 CLINICAL DATA:  Initial evaluation for possible infection. EXAM: PORTABLE CHEST 1 VIEW COMPARISON:  Prior radiograph from 01/27/2018. FINDINGS: Cardiomegaly, stable. Mediastinal silhouette within normal limits. Tortuosity the intrathoracic aorta noted. Lungs are hypoinflated. Secondary mild bibasilar bronchovascular crowding. No consolidative opacity. No edema or effusion. No pneumothorax. No acute osseous finding. IMPRESSION: Shallow lung inflation with no  active cardiopulmonary disease identified. Electronically Signed   By: Rise Mu M.D.   On: 01/02/2019 14:51   Ct Head Code Stroke Wo Contrast`  Result Date: 01/02/2019 CLINICAL DATA:  Code stroke. 68 year old female found down today with rightward gaze. EXAM: CT HEAD WITHOUT CONTRAST TECHNIQUE: Contiguous axial images were obtained from the base of the skull through the vertex without intravenous contrast. COMPARISON:  Head CT 01/27/2018 and earlier. FINDINGS: Brain: Advanced chronic cerebral white matter hypodensity and previous infarct of the right basal ganglia with ex vacuo ventricular enlargement. No acute intracranial hemorrhage identified. White matter hypodensity appears increased since 2019 bilaterally, but no acute cortically based infarct is identified. However, hypodensity in the right thalamus appears increased on series 3, image 16. Elsewhere stable deep gray matter nuclei. No midline shift, mass effect, or evidence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

## 2019-01-02 NOTE — ED Provider Notes (Addendum)
[facility] EMERGENCY DEPARTMENT Provider Note   CSN: [id number] Arrival date & time: 01/02/19  1106    History   Chief Complaint No chief complaint on file.   HPI Erika Wang is a 68 y.o. female.     The history is provided by the EMS personnel and medical records. No language interpreter was used.     68 year old female with history of prior stroke, tobacco use, hypertension, hypercholesterolemia brought here via EMS for evaluation of altered mental status.  According to EMS, neighbor has not seen patient for the past few days, daughter went to check up on her and found patient on the floor.  When EMS arrived, patient had a right ward gaze, and can only responds with "yes".  Her initial CBG was 180 something, and initial blood pressure was 180 systolic.  No signs of injury.  Level 5 caveats due to AMS and no available family member at bedside.   Past Medical History:  Diagnosis Date  . High cholesterol   . Hypertension   . Stroke [facility])     Patient Active Problem List   Diagnosis Date Noted  . Acute renal failure (HCC) 10/07/2013  . CVA (cerebral infarction) 10/03/2013  . Infection of urinary tract 10/02/2013  . Cigar smoker motivated to quit 10/02/2013  . 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 Left hemiparesis (HCC) 10/02/2013  . Encounter for long-term (current) use of medications 10/02/2013  . Stroke [facility]) 09/28/2013    Past Surgical History:  Procedure Laterality Date  . CESAREAN SECTION       OB History   No obstetric history on file.      Home Medications    Prior to Admission medications   Medication Sig Start Date End Date Taking? Authorizing Provider  aspirin EC 81 MG tablet Take 81 mg by mouth 2 (two) times daily.     [provider]  atorvastatin (LIPITOR) 10 MG tablet Take 10 mg by mouth daily.    [provider]  diltiazem (CARDIZEM CD) 180 MG 24 hr capsule Take 180 mg by mouth 2 (two) times daily.     [provider]    Family History No family history on file.  Social History Social History   Tobacco Use  . Smoking status: Former Smoker    Last attempt to quit: 09/28/2013    Years since quitting: 5.2  . Smokeless tobacco: Never Used  Substance Use Topics  . Alcohol use: Never    Frequency: Never  . Drug use: Never     Allergies   Ace inhibitors   Review of Systems Review of Systems  Unable to perform ROS: Mental status change     Physical Exam Updated Vital Signs BP (!) 164/103   Pulse (!) 133   Temp 98.4 F (36.9 C) (Axillary)   Resp 20   SpO2 97%   Physical Exam Vitals signs and nursing note reviewed.  Constitutional:      General: She is not in acute distress.    Appearance: She is well-developed.     Comments: Elderly female, altered, right ward gaze, unable to follow any command.  Strong urine odor noted.  HENT:     Head: Normocephalic and atraumatic.     Mouth/Throat:     Mouth: Mucous membranes are dry.  Eyes:     Conjunctiva/sclera: Conjunctivae normal.     Pupils: Pupils are equal, round, and reactive to light.     Comments: Rightward gaze  Neck:     Musculoskeletal: Neck supple.     Comments: Cervical collar in place, no midline step-off or crepitus. Cardiovascular:     Rate and Rhythm: Tachycardia present.  Pulmonary:     Effort: Pulmonary effort is normal.     Breath sounds: Normal breath sounds.  Abdominal:     Palpations: Abdomen is soft.     Tenderness: There is no abdominal tenderness.  Genitourinary:    Comments: Chaperone present during exam.  Stage I pressure ulcer noted at the sacral.  Intact rectal tone. Skin:    Findings: No rash.  Neurological:     Mental Status: She is alert.     GCS: GCS eye subscore is 3. GCS verbal subscore is 3. GCS motor subscore is 5.     Comments: Neurologic exam:  Speech confused, answering yes to every questions, pupils equal round reactive to light Visual field not tested No  obvious facial droop Does not follow command, able to move R arm, minimal movement of L arm Sensation, coordination, arm drift and gait not tested       ED Treatments / Results  Labs (all labs ordered are listed, but only abnormal results are displayed) Labs Reviewed  CBC WITH DIFFERENTIAL/PLATELET - Abnormal; Notable for the following components:      Result Value   WBC 11.3 (*)    HCT 46.6 (*)    Neutro Abs 8.5 (*)    Monocytes Absolute 1.3 (*)    All other components within normal limits  COMPREHENSIVE METABOLIC PANEL - Abnormal; Notable for the following components:   Glucose, Bld 118 (*)    Creatinine, Ser 1.01 (*)    Albumin 3.2 (*)    GFR calc non Af Amer 58 (*)    All other components within normal limits  URINALYSIS, ROUTINE W REFLEX MICROSCOPIC - Abnormal; Notable for the following components:   Hgb urine dipstick SMALL (*)    Ketones, ur 5 (*)    Protein, ur 100 (*)    All other components within normal limits  CK - Abnormal; Notable for the following components:   Total CK 534 (*)    All other components within normal limits  CULTURE, BLOOD (ROUTINE X 2)  CULTURE, BLOOD (ROUTINE X 2)  ETHANOL  RAPID URINE DRUG SCREEN, HOSP PERFORMED  LACTIC ACID, PLASMA  AMMONIA  TSH  MAGNESIUM  PHOSPHORUS  CBG MONITORING, ED    EKG EKG Interpretation  Date/Time:  Thursday January 02 2019 16:00:56 EST Ventricular Rate:  125 PR Interval:    QRS Duration: 72 QT Interval:  300 QTC Calculation: 433 R Axis:   -58 Text Interpretation:  Sinus tachycardia LAE, consider biatrial enlargement Left anterior fascicular block Abnormal R-wave progression, early transition Probable left ventricular hypertrophy No acute changes Confirmed by Derwood Kaplan [phone]) on 01/02/2019 4:03:56 PM   Radiology Ct Angio Head W Or Wo Contrast  Result Date: 01/02/2019 CLINICAL DATA:  68 year old female found down with right side gaze. EXAM: CT ANGIOGRAPHY HEAD AND NECK CT PERFUSION BRAIN  TECHNIQUE: Multidetector CT imaging of the head and neck was performed using the standard protocol during bolus administration of intravenous contrast. Multiplanar CT image reconstructions and MIPs were obtained to evaluate the vascular anatomy. Carotid stenosis measurements (when applicable) are obtained utilizing NASCET criteria, using the distal internal carotid diameter as the denominator. Multiphase CT imaging of the brain was performed following IV bolus contrast injection. Subsequent parametric perfusion maps were calculated using RAPID software. CONTRAST:  OMNIPAQUE IOHEXOL 350 MG/ML SOLN COMPARISON:  Head CT without contrast 1245 hours today. Cervical spine CT 01/27/2018. Brain MRI and intracranial MRA 09/28/2013. FINDINGS: CT Brain Perfusion Findings: Intermittently motion degraded. CBF (<30%) Volume: None Perfusion (Tmax>6.0s) volume: None Mismatch Volume: Not applicable Infarction Location:Not applicable CTA NECK Skeleton: Poor dentition. Cervical spine appears stable. No acute osseous abnormality identified. Upper chest: Confluent dependent right upper lobe peribronchial opacity with some consolidation suspected. Visible left lung is clear. Visible trachea is clear. No superior mediastinal lymphadenopathy. Other neck: Negative. Aortic arch: 3 vessel arch configuration. Mild arch and great vessel origin atherosclerosis. Right carotid system: Mild brachiocephalic artery plaque without stenosis. Tortuous proximal right CCA without stenosis. Mild soft and calcified plaque in the proximal right ICA with tortuosity and no stenosis. Left carotid system: Mildly tortuous proximal left CCA without stenosis. Partially retropharyngeal course of the left CCA. Mild soft and calcified plaque at the left ICA origin and bulb without stenosis. Tortuous cervical left ICA. Vertebral arteries: Minor plaque in the proximal right subclavian artery without stenosis. Normal right vertebral artery origin. The right  vertebral is dominant and patent to the skull base without stenosis. Mild plaque in the proximal left subclavian artery without stenosis. Mild calcified plaque at the left vertebral artery origin without stenosis. Non dominant left vertebral artery is patent to the skull base without stenosis. CTA HEAD Posterior circulation: Dominant right vertebral artery with V4 calcified plaque but no stenosis. Tortuous vertebrobasilar junction. Patent right PICA origin. Tortuous non dominant left vertebral artery with normal PICA origin and no stenosis. 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 Ectatic basilar tip with widely patent SCA and PCA origins. Posterior communicating arteries are diminutive or absent. Left PCA branches are within normal limits. The proximal right PCA is appear normal but there is suggestion of superior division right P3 occlusion on series 11, image 14. but this could be chronic, it is unclear whether this was present on the 2014 MRA. Anterior circulation: Both ICA siphons are patent. On the left there is moderate calcified plaque, and moderate to severe supraclinoid left ICA stenosis due to calcified plaque seen on series 6, image 218. Normal left ophthalmic artery origin. On the right there is abundant similar calcified plaque, also with moderate to severe supraclinoid ICA stenosis. See series 8, images 94 and 95. Normal right ophthalmic artery origin. Patent carotid termini, normal MCA and ACA origins. Tortuous proximal ACAs. Anterior communicating artery is diminutive or absent. ACA branches are within normal limits. Left MCA M1 segment and bifurcation are patent without stenosis. Left MCA branches are within normal limits. Right MCA M1 is also tortuous. Right MCA bifurcation is patent without stenosis. Right MCA branches appear stable since the 2014 MRA. Venous sinuses: Early contrast timing but the major dural venous sinuses appear grossly patent. Anatomic  variants: Dominant right vertebral artery. Review of the MIP images confirms the above findings IMPRESSION: 1. Negative for large vessel occlusion, and no core infarct or ischemic penumbra detected by CTP. 2. Questionable distal Right PCA P3 superior division occlusion. This might be chronic. 3. Generalized arterial tortuosity with no stenosis in the neck or the posterior circulation, but Moderate to Severe Bilateral Supraclinoid ICA stenosis due to bulky calcified plaque. 4. Right Upper Lobe Pneumonia partially visible. These results were communicated to Dr. Laurence Slate at 1:27 pmon 3/5/2020by text page via the [facility] messaging system. Electronically Signed   By: Odessa Fleming M.D.   On: 01/02/2019 13:27   Ct  Angio Neck W Or Wo Contrast  Result Date: 01/02/2019 CLINICAL DATA:  68 year old female found down with right side gaze. EXAM: CT ANGIOGRAPHY HEAD AND NECK CT PERFUSION BRAIN TECHNIQUE: Multidetector CT imaging of the head and neck was performed using the standard protocol during bolus administration of intravenous contrast. Multiplanar CT image reconstructions and MIPs were obtained to evaluate the vascular anatomy. Carotid stenosis measurements (when applicable) are obtained utilizing NASCET criteria, using the distal internal carotid diameter as the denominator. Multiphase CT imaging of the brain was performed following IV bolus contrast injection. Subsequent parametric perfusion maps were calculated using RAPID software. CONTRAST:  100mL OMNIPAQUE IOHEXOL 350 MG/ML SOLN COMPARISON:  Head CT without contrast 1245 hours today. Cervical spine CT 01/27/2018. Brain MRI and intracranial MRA 09/28/2013. FINDINGS: CT Brain Perfusion Findings: Intermittently motion degraded. CBF (<30%) Volume: None Perfusion (Tmax>6.0s) volume: None Mismatch Volume: Not applicable Infarction Location:Not applicable CTA NECK Skeleton: Poor dentition. Cervical spine appears stable. No acute osseous abnormality identified. Upper chest:  Confluent dependent right upper lobe peribronchial opacity with some consolidation suspected. Visible left lung is clear. Visible trachea is clear. No superior mediastinal lymphadenopathy. Other neck: Negative. Aortic arch: 3 vessel arch configuration. Mild arch and great vessel origin atherosclerosis. Right carotid system: Mild brachiocephalic artery plaque without stenosis. Tortuous proximal right CCA without stenosis. Mild soft and calcified plaque in the proximal right ICA with tortuosity and no stenosis. Left carotid system: Mildly tortuous proximal left CCA without stenosis. Partially retropharyngeal course of the left CCA. Mild soft and calcified plaque at the left ICA origin and bulb without stenosis. Tortuous cervical left ICA. Vertebral arteries: Minor plaque in the proximal right subclavian artery without stenosis. Normal right vertebral artery origin. The right vertebral is dominant and patent to the skull base without stenosis. Mild plaque in the proximal left subclavian artery without stenosis. Mild calcified plaque at the left vertebral artery origin without stenosis. Non dominant left vertebral artery is patent to the skull base without stenosis. CTA HEAD Posterior circulation: Dominant right vertebral artery with V4 calcified plaque but no stenosis. Tortuous vertebrobasilar junction. Patent right PICA origin. Tortuous non dominant left vertebral artery with normal PICA origin and no stenosis. 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 Ectatic basilar tip with widely patent SCA and PCA origins. Posterior communicating arteries are diminutive or absent. Left PCA branches are within normal limits. The proximal right PCA is appear normal but there is suggestion of superior division right P3 occlusion on series 11, image 14. but this could be chronic, it is unclear whether this was present on the 2014 MRA. Anterior circulation: Both ICA siphons are patent. On the left there  is moderate calcified plaque, and moderate to severe supraclinoid left ICA stenosis due to calcified plaque seen on series 6, image 218. Normal left ophthalmic artery origin. On the right there is abundant similar calcified plaque, also with moderate to severe supraclinoid ICA stenosis. See series 8, images 94 and 95. Normal right ophthalmic artery origin. Patent carotid termini, normal MCA and ACA origins. Tortuous proximal ACAs. Anterior communicating artery is diminutive or absent. ACA branches are within normal limits. Left MCA M1 segment and bifurcation are patent without stenosis. Left MCA branches are within normal limits. Right MCA M1 is also tortuous. Right MCA bifurcation is patent without stenosis. Right MCA branches appear stable since the 2014 MRA. Venous sinuses: Early contrast timing but the major dural venous sinuses appear grossly patent. Anatomic variants: Dominant right vertebral artery. Review  of the MIP images confirms the above findings IMPRESSION: 1. Negative for large vessel occlusion, and no core infarct or ischemic penumbra detected by CTP. 2. Questionable distal Right PCA P3 superior division occlusion. This might be chronic. 3. Generalized arterial tortuosity with no stenosis in the neck or the posterior circulation, but Moderate to Severe Bilateral Supraclinoid ICA stenosis due to bulky calcified plaque. 4. Right Upper Lobe Pneumonia partially visible. These results were communicated to Dr. Laurence Slate at 1:27 pmon 3/5/2020by text page via the [facility] messaging system. Electronically Signed   By: Odessa Fleming M.D.   On: 01/02/2019 13:27   Ct C-spine No Charge  Result Date: 01/02/2019 CLINICAL DATA:  Found on the floor. Right sided gaze. Decreased responsiveness. EXAM: CT CERVICAL SPINE WITHOUT CONTRAST TECHNIQUE: Multidetector CT imaging of the cervical spine was performed without intravenous contrast. Multiplanar CT image reconstructions were also generated. COMPARISON:  01/27/2018. Head and neck  CTA obtained today. FINDINGS: Alignment: Normal. Skull base and vertebrae: No acute fracture. No primary bone lesion or focal pathologic process. Soft tissues and spinal canal: No prevertebral fluid or swelling. No visible canal hematoma. Disc levels:  Mild multilevel degenerative changes. Upper chest: Again demonstrated is patchy and dense consolidation in the posterior aspect of the right upper lobe. Other: Normal appearing thyroid gland. Intravascular contrast from the recent CTA. IMPRESSION: 1. No cervical spine fracture or subluxation. 2. Mild multilevel degenerative changes. 3. Stable patchy and dense consolidation in the posterior aspect of the right upper lobe. This has an appearance compatible with pneumonia, possibly due to aspiration. Electronically Signed   By: Beckie Salts M.D.   On: 01/02/2019 14:20   Ct Cerebral Perfusion W Contrast  Result Date: 01/02/2019 CLINICAL DATA:  68 year old female found down with right side gaze. EXAM: CT ANGIOGRAPHY HEAD AND NECK CT PERFUSION BRAIN TECHNIQUE: Multidetector CT imaging of the head and neck was performed using the standard protocol during bolus administration of intravenous contrast. Multiplanar CT image reconstructions and MIPs were obtained to evaluate the vascular anatomy. Carotid stenosis measurements (when applicable) are obtained utilizing NASCET criteria, using the distal internal carotid diameter as the denominator. Multiphase CT imaging of the brain was performed following IV bolus contrast injection. Subsequent parametric perfusion maps were calculated using RAPID software. CONTRAST:  OMNIPAQUE IOHEXOL 350 MG/ML SOLN COMPARISON:  Head CT without contrast 1245 hours today. Cervical spine CT 01/27/2018. Brain MRI and intracranial MRA 09/28/2013. FINDINGS: CT Brain Perfusion Findings: Intermittently motion degraded. CBF (<30%) Volume: None Perfusion (Tmax>6.0s) volume: None Mismatch Volume: Not applicable Infarction Location:Not applicable CTA  NECK Skeleton: Poor dentition. Cervical spine appears stable. No acute osseous abnormality identified. Upper chest: Confluent dependent right upper lobe peribronchial opacity with some consolidation suspected. Visible left lung is clear. Visible trachea is clear. No superior mediastinal lymphadenopathy. Other neck: Negative. Aortic arch: 3 vessel arch configuration. Mild arch and great vessel origin atherosclerosis. Right carotid system: Mild brachiocephalic artery plaque without stenosis. Tortuous proximal right CCA without stenosis. Mild soft and calcified plaque in the proximal right ICA with tortuosity and no stenosis. Left carotid system: Mildly tortuous proximal left CCA without stenosis. Partially retropharyngeal course of the left CCA. Mild soft and calcified plaque at the left ICA origin and bulb without stenosis. Tortuous cervical left ICA. Vertebral arteries: Minor plaque in the proximal right subclavian artery without stenosis. Normal right vertebral artery origin. The right vertebral is dominant and patent to the skull base without stenosis. Mild plaque in the proximal left subclavian artery without  stenosis. Mild calcified plaque at the left vertebral artery origin without stenosis. Non dominant left vertebral artery is patent to the skull base without stenosis. CTA HEAD Posterior circulation: Dominant right vertebral artery with V4 calcified plaque but no stenosis. Tortuous vertebrobasilar junction. Patent right PICA origin. Tortuous non dominant left vertebral artery with normal PICA origin and no stenosis. 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 Ectatic basilar tip with widely patent SCA and PCA origins. Posterior communicating arteries are diminutive or absent. Left PCA branches are within normal limits. The proximal right PCA is appear normal but there is suggestion of superior division right P3 occlusion on series 11, image 14. but this could be chronic, it is  unclear whether this was present on the 2014 MRA. Anterior circulation: Both ICA siphons are patent. On the left there is moderate calcified plaque, and moderate to severe supraclinoid left ICA stenosis due to calcified plaque seen on series 6, image 218. Normal left ophthalmic artery origin. On the right there is abundant similar calcified plaque, also with moderate to severe supraclinoid ICA stenosis. See series 8, images 94 and 95. Normal right ophthalmic artery origin. Patent carotid termini, normal MCA and ACA origins. Tortuous proximal ACAs. Anterior communicating artery is diminutive or absent. ACA branches are within normal limits. Left MCA M1 segment and bifurcation are patent without stenosis. Left MCA branches are within normal limits. Right MCA M1 is also tortuous. Right MCA bifurcation is patent without stenosis. Right MCA branches appear stable since the 2014 MRA. Venous sinuses: Early contrast timing but the major dural venous sinuses appear grossly patent. Anatomic variants: Dominant right vertebral artery. Review of the MIP images confirms the above findings IMPRESSION: 1. Negative for large vessel occlusion, and no core infarct or ischemic penumbra detected by CTP. 2. Questionable distal Right PCA P3 superior division occlusion. This might be chronic. 3. Generalized arterial tortuosity with no stenosis in the neck or the posterior circulation, but Moderate to Severe Bilateral Supraclinoid ICA stenosis due to bulky calcified plaque. 4. Right Upper Lobe Pneumonia partially visible. These results were communicated to Dr. Laurence Slate at 1:27 pmon 3/5/2020by text page via the [facility] messaging system. Electronically Signed   By: Odessa Fleming M.D.   On: 01/02/2019 13:27   Dg Chest Portable 1 View  Result Date: 01/02/2019 CLINICAL DATA:  Initial evaluation for possible infection. EXAM: PORTABLE CHEST 1 VIEW COMPARISON:  Prior radiograph from 01/27/2018. FINDINGS: Cardiomegaly, stable. Mediastinal silhouette within  normal limits. Tortuosity the intrathoracic aorta noted. Lungs are hypoinflated. Secondary mild bibasilar bronchovascular crowding. No consolidative opacity. No edema or effusion. No pneumothorax. No acute osseous finding. IMPRESSION: Shallow lung inflation with no active cardiopulmonary disease identified. Electronically Signed   By: Rise Mu M.D.   On: 01/02/2019 14:51   Ct Head Code Stroke Wo Contrast`  Result Date: 01/02/2019 CLINICAL DATA:  Code stroke. 68 year old female found down today with rightward gaze. EXAM: CT HEAD WITHOUT CONTRAST TECHNIQUE: Contiguous axial images were obtained from the base of the skull through the vertex without intravenous contrast. COMPARISON:  Head CT 01/27/2018 and earlier. FINDINGS: Brain: Advanced chronic cerebral white matter hypodensity and previous infarct of the right basal ganglia with ex vacuo ventricular enlargement. No acute intracranial hemorrhage identified. White matter hypodensity appears increased since 2019 bilaterally, but no acute cortically based infarct is identified. However, hypodensity in the right thalamus appears increased on series 3, image 16. Elsewhere stable deep gray matter nuclei. No midline shift, mass effect, or evidence  of intracranial mass lesion. Vascular: Calcified atherosclerosis at the skull base. No suspicious intracranial vascular hyperdensity. Skull: No acute osseous abnormality identified. Sinuses/Orbits: Mild paranasal sinus mucosal thickening is stable since 2019. Tympanic cavities and mastoids are clear. Other: No acute orbit or scalp soft tissue finding. ASPECTS [facility] Stroke Program Early CT Score) - Ganglionic level infarction (caudate, lentiform nuclei, internal capsule, insula, M1-M3 cortex): 7 - Supraganglionic infarction (M4-M6 cortex): 3 Total score (0-10 with 10 being normal): 10 IMPRESSION: 1. Evidence of advanced small vessel disease with progression in the bilateral cerebral white matter and the right  thalamus since a year ago. 2. No acute cortically based infarct or acute intracranial hemorrhage identified. 3. These results were communicated to Dr. Laurence Slate at 1:05 pm on 01/02/2019 by text page via the [facility] messaging system. Electronically Signed   By: Odessa Fleming M.D.   On: 01/02/2019 13:05    Procedures Procedures (including critical care time)  Medications Ordered in ED Medications  sodium chloride 0.9 % bolus 1,000 mL (0 mLs Intravenous Stopped 01/02/19 1409)    And  0.9 %  sodium chloride infusion (has no administration in time range)  LORazepam (ATIVAN) 2 MG/ML injection (has no administration in time range)  levETIRAcetam (KEPPRA) 2,000 mg in sodium chloride 0.9 % 100 mL IVPB (has no administration in time range)  levETIRAcetam (KEPPRA) IVPB 500 mg/100 mL premix (has no administration in time range)  cefTRIAXone (ROCEPHIN) 1 g in sodium chloride 0.9 % 100 mL IVPB (has no administration in time range)  azithromycin (ZITHROMAX) 500 mg in sodium chloride 0.9 % 250 mL IVPB (has no administration in time range)  sodium chloride 0.9 % bolus 500 mL (0 mLs Intravenous Stopped 01/02/19 1409)  iohexol (OMNIPAQUE) 350 MG/ML injection 100 mL (100 mLs Intravenous Contrast Given 01/02/19 1308)  LORazepam (ATIVAN) injection 1 mg (1 mg Intravenous Given 01/02/19 1449)     Initial Impression / Assessment and Plan / ED Course  I have reviewed the triage vital signs and the nursing notes.  Pertinent labs & imaging results that were available during my care of the patient were reviewed by me and considered in my medical decision making (see chart for details).        BP (!) 164/103   Pulse (!) 133   Temp 98.4 F (36.9 C) (Axillary)   Resp 20   SpO2 97%    Final Clinical Impressions(s) / ED Diagnoses   Final diagnoses:  Seizure-like activity (HCC)  Community acquired pneumonia of right upper lobe of lung (HCC)  Disorientation    ED Discharge Orders    None     11:30 AM Pt with hx of prior  stroke without deficit was found down by her daughter at home for unknown amount of time. She exhibit Rightward gaze, non purposeful movement of R arm and minimal movement of L arm.  Strong urine odor, she's tachycardic.  Concerning for stroke/seizure or potential infection.  Work up initiated, will consult neuro.  Care discussed with Dr. Rubin Payor.   11:59 AM Rectal temp 95.3, HR 111, WBC 11.3.  Concern for potential UTI.  Code sepsis initiated.  IVF at 75ml/kg  12:43 PM UA without obvious sign of UTI, code sepsis cancelled.  I have consulted with neurology Dr. Laurence Slate who wants pt to have head CT stat.    3:03 PM Head CT scan as well as CT angiogram without any evidence of acute stroke.  Right upper lobe pneumonia partially visible.  Chest x-ray did  not show any active cardiopulmonary disease.  Total CK is 534, UA shows 11-20 WBC but nitrite negative and leukocyte Estrace negative.  Normal lactic acid.  Neurology has seen and evaluated patient and felt that this could be seizure treating and patient perhaps in postictal state.  EEG has been performed.  The recommendation is to have patient admitted to medicine stepdown for further valuation of her condition.  In the setting of potential pneumonia, will initiate antibiotic to cover for aspiration pneumonia.  Patient is tachycardic and tachypneic but normal lactic acid.  Due to potential infection, sepsis work up initiated. Due to no evidence of hypotension, will not need fluid resuscitation of /kg.  Daughter is now at bedside.  She mentioned pt has had several similar episodes of confusions after UTI in the past several months. I discussed with neurologist who felt this is more suggestive of seizure.    3:59 PM Appreciate consultation from [facility] Dr. Jerral Ralph who agrees to admit pt for further management of her condition.  Pt will need to be admitted to STEP DOWN       Fayrene Helper, PA-C 01/02/19 1605    Benjiman Core,  MD 01/03/19 [phone]

## 2019-01-02 NOTE — ED Triage Notes (Signed)
PT here via EMS from her home where her neighbors saw her out walking two days ago but no one has seen her since. Daughter went to check on her today and found her on the floor. Pt has right sided gaze. Pt less responsive than usual. Will respond with "yes" or "no" only. C collar in place.

## 2019-01-02 NOTE — Procedures (Signed)
  [facility] [name] A. Gerilyn Pilgrim, MD     [email or web address]           HISTORY: This is a 68 year old who presents with altered mental status and right gaze deviation.  The semiology suspicious for seizures.  MEDICATIONS:  Current Facility-Administered Medications:  .  [COMPLETED] sodium chloride 0.9 % bolus 1,000 mL, 1,000 mL, Intravenous, Once, Stopped at 01/02/19 1409 **AND** 0.9 %  sodium chloride infusion, , Intravenous, Continuous, Fayrene Helper, PA-C .  cefTRIAXone (ROCEPHIN) 1 g in sodium chloride 0.9 % 100 mL IVPB, 1 g, Intravenous, Once, Fayrene Helper, PA-C .  [START ON 01/03/2019] levETIRAcetam (KEPPRA) IVPB 500 mg/100 mL premix, 500 mg, Intravenous, Q12H, Ulice Dash, PA-C .  LORazepam (ATIVAN) 2 MG/ML injection, , , ,  .  metroNIDAZOLE (FLAGYL) IVPB 500 mg, 500 mg, Intravenous, Once, Fayrene Helper, PA-C  Current Outpatient Medications:  .  aspirin EC 81 MG tablet, Take 81 mg by mouth 2 (two) times daily. , Disp: , Rfl:  .  atorvastatin (LIPITOR) 10 MG tablet, Take 10 mg by mouth daily., Disp: , Rfl:  .  diltiazem (CARDIZEM CD) 180 MG 24 hr capsule, Take 180 mg by mouth 2 (two) times daily., Disp: , Rfl:      ANALYSIS: A 16 channel recording using standard 10 20 measurements is conducted for 37 minutes.  The back activity gets as high as 6 Hz.  There is intermittent rhythmic delta activity seen that is generalized and with frontocentral maximum.  There is continuous complex involving the right temporal region with phase reversal at T4.  These complexes consist of spike slow wave activity, sharp wave activity and triphasic waves all phase reversing at T3.  Photic stimulation and hyperventilation are not carried out.   IMPRESSION: This recording is abnormal for the following reasons: 1.  Continuous epileptiform discharge involving the right temporal region meeting the electrographic criteria for for status epilepticus. 2.  Moderate global slowing indicating a moderate  global encephalopathy.      [name] A. Gerilyn Pilgrim, M.D.  Diplomate, Biomedical engineer of Psychiatry and Neurology ( Neurology).

## 2019-01-02 NOTE — Progress Notes (Signed)
EEG completed, results pending. 

## 2019-01-02 NOTE — Progress Notes (Signed)
LTM EEG started 

## 2019-01-02 NOTE — Consult Note (Addendum)
Neurology Consultation  Reason for Consult: Possible stroke versus seizure Referring Physician: Rubin Payor   History is obtained from: Daughter  HPI: Erika Wang is a 68 y.o. female with history of stroke, hypertension, hypercholesterolemia.  Patient lives by herself.  Her daughter went to see her 2 days ago but patient did not answer the door.  Due to the fact that she often is a introvert and or sleeps a lot she did not think anything of this.  The next day she did go to her apartment manager who noted that she was acting somewhat odd and also the physical therapist noted that she did not come to the door the second day.  Due to the fact that the patient was not answering the phone and did not come to the door today she became very concerned and went into the apartment and found patient covered with cat litter with a right gaze and unresponsive.  Patient was placed in a c-collar and brought to [facility].  Daughter does question if patient has some form of neurocognitive decline.  In the past she has also had issues with UTIs and becoming very confused for prolonged time.   ED course: CT head/CT angiogram and perfusion along with labs   Chart review: Patient was seen back in 2014 and at that time had sudden onset left-sided weakness.  NIH of 9.  She did have TPA administered.  Due to worsening of symptoms after TPA she was brought to MRI which did not show any intervene of the lesion.  Subsequently she significantly improved without any hemiparesis.  CT head at that time showed no evidence of acute intracranial abnormality, MRI brain limited showed axial diffusion weighted image which demonstrated confluent restricted diffusion affecting 3.5 cm of the right basal ganglia.  MRA of brain showed anterior circulation was negative except for mild irregularity compatible with atherosclerosis.  No right M1 or A1 occlusion, confluent acute right basal ganglia infarct.  2D echo showed an ejection  fraction of 65% and atrial septum with borderline criteria for aneurysm. Carotid Dopplers showed findings suggestive of 1 to 39% internal carotid artery stenosis bilaterally.  Patient was sent to CIR   LKW: 12/31/2018 tpa given?: no, out of window No large vessel occlusion noted on CTA of head thus not an IR candidate At this point condition looks more of postictal seizure NIHSS of 16  ROS:  Unable to obtain due to altered mental status.   Past Medical History:  Diagnosis Date  . High cholesterol   . Hypertension   . Stroke [facility])    Family History  Problem Relation Age of Onset  . Hypertension Mother   . Hypertension Father    Social History:   reports that she quit smoking about 5 years ago. She has never used smokeless tobacco. She reports that she does not drink alcohol or use drugs.  Medications  Current Facility-Administered Medications:  .  [COMPLETED] sodium chloride 0.9 % bolus 1,000 mL, 1,000 mL, Intravenous, Once, Last Rate: 999 mL/hr at 01/02/19 1155, 1,000 mL at 01/02/19 1155 **AND** 0.9 %  sodium chloride infusion, , Intravenous, Continuous, Fayrene Helper, PA-C .  sodium chloride 0.9 % bolus 500 mL, 500 mL, Intravenous, Once, Fayrene Helper, PA-C  Current Outpatient Medications:  .  atorvastatin (LIPITOR) 10 MG tablet, Take 10 mg by mouth daily., Disp: , Rfl:  .  diltiazem (CARDIZEM CD) 180 MG 24 hr capsule, Take 180 mg by mouth 2 (two) times daily., Disp: ,  Rfl:  .  aspirin EC 81 MG tablet, Take 81 mg by mouth 2 (two) times daily. , Disp: , Rfl:    Exam: Current vital signs: BP (!) 168/89   Pulse (!) 126   Temp (!) 95.3 F (35.2 C) (Rectal)   Resp 19   SpO2 100%  Vital signs in last 24 hours: Temp:  [95.3 F (35.2 C)] 95.3 F (35.2 C) (03/05 1124) Pulse Rate:  [103-126] 126 (03/05 1330) Resp:  [14-21] 19 (03/05 1330) BP: (143-185)/(85-105) 168/89 (03/05 1230) SpO2:  [97 %-100 %] 100 % (03/05 1330)  Physical Exam  Constitutional: Appears well-developed  and well-nourished.  Psych: Affect appropriate to situation Eyes: No scleral injection HENT: No OP obstrucion Head: Normocephalic.  Cardiovascular: Normal rate and regular rhythm.  Respiratory: Effort normal, non-labored breathing GI: Soft.  No distension. There is no tenderness.  Skin: WDI  Neuro: Mental Status: At present time patient is in a c-collar.  She has a right gaze deviation.  She does not answer to questions or follow commands.  She will cross midline with eyes but still has a right gaze preference.  She will withdraw from pain on the right side greater than left. Cranial Nerves: II: Blinks to threat bilaterally III,IV, VI: Does have a right gaze preference however when her name is called multiple times that she does cross midline to the left briefly. Pupils are equal, round, and reactive to light.   V: Facial sensation is symmetric to temperature VII: Facial movement is symmetric to noxious stimuli VIII: hearing is intact to voice X: Uvula elevates symmetrically  Motor: Patient is moving her right arm 5/5, right leg withdraws with good 5/5, left arm is able to lift antigravity but weaker than right and left leg withdraws from pain 2-3/5 Sensory: As patient is unable to answer questions it is noted that she withdraws from pain in all 4 extremities Deep Tendon Reflexes: 2+ and symmetric in the biceps and patellae.  Plantars: Downgoing bilaterally Cerebellar: Unable to test  Labs I have reviewed labs in epic and the results pertinent to this consultation are:   CBC    Component Value Date/Time   WBC 11.3 (H) 01/02/2019 1127   RBC 5.04 01/02/2019 1127   HGB 14.9 01/02/2019 1127   HCT 46.6 (H) 01/02/2019 1127   PLT 247 01/02/2019 1127   MCV 92.5 01/02/2019 1127   MCH 29.6 01/02/2019 1127   MCHC 32.0 01/02/2019 1127   RDW 14.6 01/02/2019 1127   LYMPHSABS 1.4 01/02/2019 1127   MONOABS 1.3 (H) 01/02/2019 1127   EOSABS 0.0 01/02/2019 1127   BASOSABS 0.0  01/02/2019 1127    CMP     Component Value Date/Time   NA 137 01/02/2019 1127   K 4.2 01/02/2019 1127   CL 98 01/02/2019 1127   CO2 25 01/02/2019 1127   GLUCOSE 118 (H) 01/02/2019 1127   BUN 20 01/02/2019 1127   CREATININE 1.01 (H) 01/02/2019 1127   CALCIUM 10.0 01/02/2019 1127   PROT 8.1 01/02/2019 1127   ALBUMIN 3.2 (L) 01/02/2019 1127   AST 34 01/02/2019 1127   ALT 16 01/02/2019 1127   ALKPHOS 101 01/02/2019 1127   BILITOT 0.7 01/02/2019 1127   GFRNONAA 58 (L) 01/02/2019 1127   GFRAA >60 01/02/2019 1127    Lipid Panel     Component Value Date/Time   CHOL 176 09/29/2013 0415   TRIG 130 09/29/2013 0415   HDL 51 09/29/2013 0415   CHOLHDL 3.5 09/29/2013 0415  VLDL 26 09/29/2013 0415   LDLCALC 99 09/29/2013 0415     Imaging I have reviewed the images obtained:  CT-scan of the brain-evidence of advanced small vessel disease with progression in the bilateral cerebral white matter and right thalamus since a year ago.  No acute cortically based infarct or acute intracranial hemorrhage identified.  CTA of head and neck/CT perfusion- negative for large vessel occlusion and no cord infarct or ischemic penumbra detected on CTP  MRI examination of the brain- when able  EEG- shows PLEDS which improved with 1 mg Ativan thus 1 Gram Keppra was administered followed by 500 mg Keppra IV BID then hook up to LTM  Formal reading spot EEG shows:This recording is abnormal for the following reasons: 1.  Continuous epileptiform discharge involving the right temporal region meeting the electrographic criteria for for status epilepticus. 2.  Moderate global slowing indicating a moderate global encephalopathy.   Felicie Morn PA-C [facility] [phone]  M-F  (9:00 am- 5:00 PM)  01/02/2019, 1:46 PM    NEUROHOSPITALIST ADDENDUM Performed a face to face diagnostic evaluation.   I have reviewed the contents of history and physical exam as documented by PA/ARNP/Resident  and agree with above documentation.  I have discussed and formulated the above plan as documented. Edits to the note have been made as needed.  ASSESSMENT AND PLAN    63 y Female with past medical history of stroke in 2014, hypertension hyperlipidemia presents to the emergency room after being found down.  Last seen normal was 2 days ago by her family. On arrival to emergency department patient had right gaze deviation and left hemiparesis and neurology was consulted.  Immediate CT head was obtained which showed no large acute right MCA stroke-stat CT angiogram negative for LVO and CT perfusion negative for any perfusion deficit.  Due to concern for patient may be in status, stat EEG was obtained which showed right hemispheric seizures.  Patient was connected to long-term EEG and received 2 g of Keppra IV load. Reviewed EEG at 4:45 PM, continue to have frequent right hemispheric PLEDs and loaded with fosphenytoin 1000mg .    Impression: -Nonconvulsive status epilepticus -Unresponsive on presentation  Recommendations: -Continue long-term EEG and monitoring for epileptiform activity -Continue Keppra 1 g twice daily -Continue Dilantin 100 mg 3 times daily, appreciate pharmacy assistance in dosing -Continue frequent neurochecks and monitoring of vitals -Continue assessment of airway, currently she is protecting    This patient is neurologically critically ill due to status epilepticus.  She is at risk for significant risk of neurological worsening from worsening seizures,  infection, respiratory failure and cardiac arrest. This patient's care requires constant monitoring of vital signs, hemodynamics, respiratory and cardiac monitoring,reviewing EEG,  neurological assessment, discussion with family, other specialists and medical decision making of high complexity.  I spent 70  minutes of neurocritical time in the care of this patient.      Georgiana Spinner Aroor MD [facility] [phone]   If 7pm to 7am, please call on call as listed on AMION.

## 2019-01-02 NOTE — Consult Note (Signed)
NAMEDelaylah Wang, MRN:  [id number], DOB:  10-12-1951, LOS: 0 ADMISSION DATE:  01/02/2019, CONSULTATION DATE:  3/5 REFERRING MD:  Dr. Jerral Ralph, CHIEF COMPLAINT:  Status epilepticus   Brief History   68 year old female admitted with AMS found to have status epilepticus refractory to the AEDs and required transfer to ICU for intubation and continuous infusion.   History of present illness   Patient is encephalopathic and/or intubated. Therefore history has been obtained from chart review. 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 She was last seen well by family 3/3 and had no complaints, but seemed to be a bit off balance. Then 3/5 she was found down with R gaze and was unresponsive. Initial imaging was unremarkable with the exception of suspected aspiration PNA RUL. EEG, however, demonstrated seizure. She was treated with Keppra and CAP coverage and admitted for continuous EEG. Despite escalating seizure medication she remained unresponsive with seizure on EEG. PCCM was consulted to transfer to ICU and possible intubation.   Past Medical History   has a past medical history of High cholesterol, Hypertension, and Stroke (HCC).   Significant Hospital Events   3/5 admit to floor, then ICU transfer.   Consults:  Neurology  Procedures:    Significant Diagnostic Tests:  CT head 3/5 > small vessel disease. No acute cortically based infarct or hemorrhage.  CT angio and perfusion 3/5 > negative large vessel occlusion. No core infarct or penumbra. Moderate to severe ICA stenosis. RUL pneumonia partially visible.  CT C-spine 3/5 > No cervical spine fracture or subluxation. Mild multilevel degenerative changes.  Micro Data:  Blood 3/5 >  Tracheal aspirate  3/5 >  Antimicrobials:  Ceftriaxone 3/5  Flagyl 3/5  Unasyn 3/5 >>>  Interim history/subjective:    Objective   Blood pressure 130/75, pulse 96,  temperature 98.1 F (36.7 C), temperature source Axillary, resp. rate 17, height 5' (1.524 m), weight 59 kg, SpO2 97 %.       No intake or output data in the 24 hours ending 01/02/19 2234 Filed Weights   01/02/19 1715  Weight: 59 kg    Examination: General: Thin elderly female in NAD HENT: NA/AT, PERRL, no JVD. Gaze midline.  Lungs: Clear. Upper airway sounds from pooled secretions.  Cardiovascular: RRR, no MRG Abdomen: Soft, non-tender, non-distended Extremities: No acute deformity Neuro: Unresponsive to even painful stimuli.   Resolved Hospital Problem list     Assessment & Plan:   Status epilepticus - Transfer to ICU - She will require intubation for airway protection - AED management per neurology - Suspect she will require continuous sedation infusion for burst suppression.  - Frequent neurochecks - Continuous EEG  Inability to protect airway - Intubate - Full vent support - Propofol for RASS goal 0 to -1, but will defer goal/dosing to neurology if required for burst suppression - CXR - ABG - VAP bundle  Aspiration Pneumonia - Continue Unasyn - Blood culture pending - Obtain tracheal aspirate culture once intubated  Hypertension Hyperlipidemia - Will hold home diltiazem, atorvastatin until taking PO  Possible CVA - Await MRI - Neurology following   Best practice:  Diet: NPO Pain/Anxiety/Delirium protocol (if indicated): Propofol per PAD protocol VAP protocol (if indicated): Yes DVT prophylaxis: enoxaparin  GI prophylaxis: N/a  Glucose control: N/a Mobility: BR Code Status: FULL Family Communication: Daughters number in [employer] cannot be completed as dialed" Disposition: ICU  Labs   CBC: Recent Labs  Lab 01/02/19 1127  WBC 11.3*  NEUTROABS 8.5*  HGB 14.9  HCT 46.6*  MCV 92.5  PLT 247    Basic Metabolic Panel: Recent Labs  Lab 01/02/19 1127 01/02/19 1526  NA 137  --   K 4.2  --   CL 98  --   CO2 25  --   GLUCOSE 118*  --     BUN 20  --   CREATININE 1.01*  --   CALCIUM 10.0  --   MG  --  2.0  PHOS  --  2.5   GFR: Estimated Creatinine Clearance: 44.6 mL/min (A) (by C-G formula based on SCr of 1.01 mg/dL (H)). Recent Labs  Lab 01/02/19 1127 01/02/19 1358  WBC 11.3*  --   LATICACIDVEN  --  1.6    Liver Function Tests: Recent Labs  Lab 01/02/19 1127  AST 34  ALT 16  ALKPHOS 101  BILITOT 0.7  PROT 8.1  ALBUMIN 3.2*   No results for input(s): LIPASE, AMYLASE in the last 168 hours. Recent Labs  Lab 01/02/19 1526  AMMONIA 15    ABG    Component Value Date/Time   TCO2 29 09/28/2013 1249     Coagulation Profile: No results for input(s): INR, PROTIME in the last 168 hours.  Cardiac Enzymes: Recent Labs  Lab 01/02/19 1127  CKTOTAL 534*    HbA1C: Hgb A1c MFr Bld  Date/Time Value Ref Range Status  09/29/2013 04:15 AM 5.9 (H) <5.7 % Final    Comment:    (NOTE)                                                                       According to the ADA Clinical Practice Recommendations for 2011, when HbA1c is used as a screening test:  >=6.5%   Diagnostic of Diabetes Mellitus           (if abnormal result is confirmed) 5.7-6.4%   Increased risk of developing Diabetes Mellitus References:Diagnosis and Classification of Diabetes Mellitus,Diabetes Care,2011,34(Suppl 1):S62-S69 and Standards of Medical Care in         Diabetes - 2011,Diabetes Care,2011,34 (Suppl 1):S11-S61.    CBG: Recent Labs  Lab 01/02/19 1134  GLUCAP 97    Review of Systems:   Unable as patient is encephalopathic.  Past Medical History  She,  has a past medical history of High cholesterol, Hypertension, and Stroke (HCC).   Surgical History    Past Surgical History:  Procedure Laterality Date  . CESAREAN SECTION       Social History   reports that she quit smoking about 5 years ago. She has never used smokeless tobacco. She reports that she does not drink alcohol or use drugs.   Family History   Her  family history includes Hypertension in her father and mother.   Allergies Allergies  Allergen Reactions  . Ace Inhibitors Other (See Comments)    Acute renal failure     Home Medications  Prior to Admission medications   Medication Sig Start Date End Date Taking? Authorizing Provider  aspirin EC 81 MG tablet Take 81 mg by mouth 2 (two) times daily.    Yes [provider]  atorvastatin (  LIPITOR) 10 MG tablet Take 10 mg by mouth daily.   Yes [provider]  diltiazem (CARDIZEM CD) 180 MG 24 hr capsule Take 180 mg by mouth 2 (two) times daily.   Yes [provider]     Critical care time: 35 mins     Joneen Roach, AGACNP-BC [facility] Pulmonary/Critical Care Pager [phone] or [phone]  01/02/2019 11:03 PM

## 2019-01-02 NOTE — Progress Notes (Signed)
Pharmacy Antibiotic Note  Erika Wang is a 68 y.o. female admitted on 01/02/2019 with aspiration pneumonia.  Pharmacy has been consulted for unasyn dosing. On admission patient currently afebrile, RR 21, HR 122, lactic acid 1.6, Scr stable at 1.01.  Plan: Unasyn 3g IV q8h Monitor Scr, CBC, and clinical progress F/u C&S, de-escalation plans and LOT     Temp (24hrs), Avg:96.9 F (36.1 C), Min:95.3 F (35.2 C), Max:98.4 F (36.9 C)  Recent Labs  Lab 01/02/19 1127 01/02/19 1358  WBC 11.3*  --   CREATININE 1.01*  --   LATICACIDVEN  --  1.6    CrCl cannot be calculated (Unknown ideal weight.).    Allergies  Allergen Reactions  . Ace Inhibitors Other (See Comments)    Acute renal failure    Antimicrobials this admission: Unasyn 3/5 >>  Ceftriaxone 3/5 x 1  Metronidazole 3/5 x 1  Dose adjustments this admission: N/A  Microbiology results: 3/5 BCx:   Thank you for allowing pharmacy to be a part of this patient's care.  Otis Peak 01/02/2019 5:08 PM

## 2019-01-02 NOTE — Progress Notes (Signed)
Patient continues to be obtunded with left hemiparesis, no gazed deviation, I feel that more aggressive treatment is warrented as she has not responded to keppra or fosphenytoin. I would favor adding vimpat, but am quite concerned about airway protection. I have asked CCM for evaluation, but I suspect that she will need to be moved to the ICU for intubation for airway protection.  Will follow.   Ritta Slot, MD [facility] [phone]  If 7pm- 7am, please page neurology on call as listed in AMION.

## 2019-01-03 ENCOUNTER — Inpatient Hospital Stay (HOSPITAL_COMMUNITY): Payer: Medicare HMO

## 2019-01-03 DIAGNOSIS — I361 Nonrheumatic tricuspid (valve) insufficiency: Secondary | ICD-10-CM

## 2019-01-03 DIAGNOSIS — R6521 Severe sepsis with septic shock: Secondary | ICD-10-CM

## 2019-01-03 DIAGNOSIS — J181 Lobar pneumonia, unspecified organism: Secondary | ICD-10-CM

## 2019-01-03 DIAGNOSIS — G9340 Encephalopathy, unspecified: Secondary | ICD-10-CM

## 2019-01-03 DIAGNOSIS — Z01818 Encounter for other preprocedural examination: Secondary | ICD-10-CM

## 2019-01-03 DIAGNOSIS — A419 Sepsis, unspecified organism: Secondary | ICD-10-CM

## 2019-01-03 DIAGNOSIS — J189 Pneumonia, unspecified organism: Secondary | ICD-10-CM

## 2019-01-03 DIAGNOSIS — G40901 Epilepsy, unspecified, not intractable, with status epilepticus: Secondary | ICD-10-CM

## 2019-01-03 DIAGNOSIS — J9601 Acute respiratory failure with hypoxia: Secondary | ICD-10-CM

## 2019-01-03 LAB — INFLUENZA PANEL BY PCR (TYPE A & B)
Influenza A By PCR: NEGATIVE
Influenza B By PCR: NEGATIVE

## 2019-01-03 LAB — TRIGLYCERIDES: Triglycerides: 68 mg/dL (ref <150)

## 2019-01-03 LAB — LIPID PANEL
Cholesterol: 119 mg/dL (ref 0–200)
HDL: 39 mg/dL — ABNORMAL LOW (ref >40)
LDL Cholesterol: 69 mg/dL (ref 0–99)
Total CHOL/HDL Ratio: 3.1 RATIO
Triglycerides: 56 mg/dL (ref <150)
VLDL: 11 mg/dL (ref 0–40)

## 2019-01-03 LAB — POCT I-STAT 7, (LYTES, BLD GAS, ICA,H+H)
Acid-Base Excess: 1 mmol/L (ref 0.0–2.0)
BICARBONATE: 26.6 mmol/L (ref 20.0–28.0)
Calcium, Ion: 1.2 mmol/L (ref 1.15–1.40)
HCT: 31 % — ABNORMAL LOW (ref 36.0–46.0)
Hemoglobin: 10.5 g/dL — ABNORMAL LOW (ref 12.0–15.0)
O2 Saturation: 100 %
Patient temperature: 97.6
Potassium: 3.6 mmol/L (ref 3.5–5.1)
Sodium: 141 mmol/L (ref 135–145)
TCO2: 28 mmol/L (ref 22–32)
pCO2 arterial: 44.8 mmHg (ref 32.0–48.0)
pH, Arterial: 7.38 (ref 7.350–7.450)
pO2, Arterial: 432 mmHg — ABNORMAL HIGH (ref 83.0–108.0)

## 2019-01-03 LAB — LACTIC ACID, PLASMA
LACTIC ACID, VENOUS: 1.7 mmol/L (ref 0.5–1.9)
Lactic Acid, Venous: 1.3 mmol/L (ref 0.5–1.9)

## 2019-01-03 LAB — BASIC METABOLIC PANEL
Anion gap: 8 (ref 5–15)
BUN: 14 mg/dL (ref 8–23)
CO2: 23 mmol/L (ref 22–32)
Calcium: 8.2 mg/dL — ABNORMAL LOW (ref 8.9–10.3)
Chloride: 109 mmol/L (ref 98–111)
Creatinine, Ser: 0.83 mg/dL (ref 0.44–1.00)
GFR calc Af Amer: >60 mL/min (ref >60)
GLUCOSE: 139 mg/dL — AB (ref 70–99)
Potassium: 3.5 mmol/L (ref 3.5–5.1)
Sodium: 140 mmol/L (ref 135–145)

## 2019-01-03 LAB — BLOOD CULTURE ID PANEL (REFLEXED)
Acinetobacter baumannii: NOT DETECTED
CANDIDA KRUSEI: NOT DETECTED
Candida albicans: NOT DETECTED
Candida glabrata: NOT DETECTED
Candida parapsilosis: NOT DETECTED
Candida tropicalis: NOT DETECTED
ENTEROBACTERIACEAE SPECIES: NOT DETECTED
ESCHERICHIA COLI: NOT DETECTED
Enterobacter cloacae complex: NOT DETECTED
Enterococcus species: NOT DETECTED
Haemophilus influenzae: NOT DETECTED
Klebsiella oxytoca: NOT DETECTED
Klebsiella pneumoniae: NOT DETECTED
Listeria monocytogenes: NOT DETECTED
Methicillin resistance: NOT DETECTED
Neisseria meningitidis: NOT DETECTED
Proteus species: NOT DETECTED
Pseudomonas aeruginosa: NOT DETECTED
STREPTOCOCCUS PYOGENES: NOT DETECTED
Serratia marcescens: NOT DETECTED
Staphylococcus aureus (BCID): NOT DETECTED
Staphylococcus species: DETECTED — AB
Streptococcus agalactiae: NOT DETECTED
Streptococcus pneumoniae: NOT DETECTED
Streptococcus species: NOT DETECTED

## 2019-01-03 LAB — ECHOCARDIOGRAM COMPLETE
Height: 60 in
Weight: 2080 oz

## 2019-01-03 LAB — CBC
HCT: 33.4 % — ABNORMAL LOW (ref 36.0–46.0)
HEMOGLOBIN: 10.7 g/dL — AB (ref 12.0–15.0)
MCH: 29.8 pg (ref 26.0–34.0)
MCHC: 32 g/dL (ref 30.0–36.0)
MCV: 93 fL (ref 80.0–100.0)
Platelets: 194 10*3/uL (ref 150–400)
RBC: 3.59 MIL/uL — ABNORMAL LOW (ref 3.87–5.11)
RDW: 14.7 % (ref 11.5–15.5)
WBC: 12.6 10*3/uL — ABNORMAL HIGH (ref 4.0–10.5)
nRBC: 0 % (ref 0.0–0.2)

## 2019-01-03 LAB — PHENYTOIN LEVEL, TOTAL: Phenytoin Lvl: 14.1 ug/mL (ref 10.0–20.0)

## 2019-01-03 LAB — MRSA PCR SCREENING: MRSA by PCR: NEGATIVE

## 2019-01-03 LAB — PROCALCITONIN
Procalcitonin: 0.18 ng/mL
Procalcitonin: 0.17 ng/mL

## 2019-01-03 LAB — HEMOGLOBIN A1C
Hgb A1c MFr Bld: 5.9 % — ABNORMAL HIGH (ref 4.8–5.6)
Mean Plasma Glucose: 122.63 mg/dL

## 2019-01-03 LAB — HIV ANTIBODY (ROUTINE TESTING W REFLEX): HIV Screen 4th Generation wRfx: NONREACTIVE

## 2019-01-03 MED ORDER — PHENYLEPHRINE HCL-NACL 10-0.9 MG/250ML-% IV SOLN
0.0000 ug/min | INTRAVENOUS | Status: DC
  Administered 2019-01-03: 5 ug/min via INTRAVENOUS

## 2019-01-03 MED ORDER — FENTANYL CITRATE (PF) 100 MCG/2ML IJ SOLN
100.0000 ug | Freq: Once | INTRAMUSCULAR | Status: AC
  Administered 2019-01-03: 100 ug via INTRAVENOUS

## 2019-01-03 MED ORDER — FENTANYL CITRATE (PF) 100 MCG/2ML IJ SOLN: 50.0000 ug | INTRAMUSCULAR | Status: DC | PRN

## 2019-01-03 MED ORDER — SODIUM CHLORIDE 0.9 % IV SOLN
200.0000 mg | Freq: Two times a day (BID) | INTRAVENOUS | Status: DC
  Administered 2019-01-03 – 2019-01-08 (×12): 200 mg via INTRAVENOUS
  Filled 2019-01-03 (×12): qty 20

## 2019-01-03 MED ORDER — MIDAZOLAM BOLUS VIA INFUSION
10.0000 mg | INTRAVENOUS | Status: AC
  Administered 2019-01-03 (×3): 10 mg via INTRAVENOUS
  Filled 2019-01-03 (×3): qty 10

## 2019-01-03 MED ORDER — MIDAZOLAM 50MG/50ML (1MG/ML) PREMIX INFUSION: 60.0000 mg/h | INTRAVENOUS | Status: DC

## 2019-01-03 MED ORDER — MIDAZOLAM BOLUS VIA INFUSION
10.0000 mg | INTRAVENOUS | Status: AC
  Administered 2019-01-03 (×2): 10 mg via INTRAVENOUS
  Filled 2019-01-03 (×2): qty 10

## 2019-01-03 MED ORDER — SODIUM CHLORIDE 0.9 % IV SOLN
250.0000 mL | INTRAVENOUS | Status: DC
  Administered 2019-01-03: 10 mL via INTRAVENOUS
  Administered 2019-01-06 – 2019-01-29 (×8): 250 mL via INTRAVENOUS

## 2019-01-03 MED ORDER — MIDAZOLAM BOLUS VIA INFUSION
10.0000 mg | Freq: Once | INTRAVENOUS | Status: AC
  Administered 2019-01-03: 10 mg via INTRAVENOUS
  Filled 2019-01-03: qty 10

## 2019-01-03 MED ORDER — SODIUM CHLORIDE 0.9 % IV BOLUS
500.0000 mL | Freq: Once | INTRAVENOUS | Status: AC
  Administered 2019-01-03: 500 mL via INTRAVENOUS

## 2019-01-03 MED ORDER — CEFAZOLIN SODIUM-DEXTROSE 2-4 GM/100ML-% IV SOLN: 2.0000 g | Freq: Three times a day (TID) | INTRAVENOUS | Status: DC

## 2019-01-03 MED ORDER — PANTOPRAZOLE SODIUM 40 MG IV SOLR
40.0000 mg | Freq: Every day | INTRAVENOUS | Status: DC
  Administered 2019-01-03 – 2019-01-05 (×3): 40 mg via INTRAVENOUS
  Filled 2019-01-03 (×3): qty 40

## 2019-01-03 MED ORDER — SODIUM CHLORIDE 0.9 % IV SOLN
50.0000 mg/h | INTRAVENOUS | Status: DC
  Administered 2019-01-03: 50 mg/h via INTRAVENOUS
  Filled 2019-01-03: qty 50

## 2019-01-03 MED ORDER — NOREPINEPHRINE 4 MG/250ML-% IV SOLN
0.0000 ug/min | INTRAVENOUS | Status: DC
  Administered 2019-01-03 – 2019-01-05 (×2): 2 ug/min via INTRAVENOUS
  Filled 2019-01-03 (×3): qty 250

## 2019-01-03 MED ORDER — PHENYTOIN SODIUM 50 MG/ML IJ SOLN
100.0000 mg | Freq: Three times a day (TID) | INTRAMUSCULAR | Status: DC
  Administered 2019-01-03 – 2019-01-08 (×16): 100 mg via INTRAVENOUS
  Filled 2019-01-03 (×16): qty 2

## 2019-01-03 MED ORDER — CHLORHEXIDINE GLUCONATE 0.12% ORAL RINSE (MEDLINE KIT)
15.0000 mL | Freq: Two times a day (BID) | OROMUCOSAL | Status: DC
  Administered 2019-01-03 – 2019-01-25 (×46): 15 mL via OROMUCOSAL

## 2019-01-03 MED ORDER — ORAL CARE MOUTH RINSE
15.0000 mL | OROMUCOSAL | Status: DC
  Administered 2019-01-03 – 2019-01-25 (×220): 15 mL via OROMUCOSAL

## 2019-01-03 MED ORDER — SODIUM CHLORIDE 0.9 % IV SOLN
60.0000 mg/h | INTRAVENOUS | Status: DC
  Administered 2019-01-03 – 2019-01-05 (×12): 60 mg/h via INTRAVENOUS
  Filled 2019-01-03 (×13): qty 50

## 2019-01-03 MED ORDER — MIDAZOLAM BOLUS VIA INFUSION
10.0000 mg | INTRAVENOUS | Status: AC
  Administered 2019-01-03 (×5): 10 mg via INTRAVENOUS
  Filled 2019-01-03 (×5): qty 10

## 2019-01-03 MED ORDER — PROPOFOL 1000 MG/100ML IV EMUL
INTRAVENOUS | Status: AC
  Administered 2019-01-03: 30 ug/kg/min via INTRAVENOUS
  Filled 2019-01-03: qty 100

## 2019-01-03 MED ORDER — MIDAZOLAM 50MG/50ML (1MG/ML) PREMIX INFUSION: 0.5000 mg/h | INTRAVENOUS | Status: DC

## 2019-01-03 MED ORDER — PROPOFOL 1000 MG/100ML IV EMUL
30.0000 ug/kg/min | INTRAVENOUS | Status: DC
  Administered 2019-01-03: 30 ug/kg/min via INTRAVENOUS
  Administered 2019-01-03: 20 ug/kg/min via INTRAVENOUS
  Administered 2019-01-04 (×4): 40 ug/kg/min via INTRAVENOUS
  Administered 2019-01-05 (×2): 30 ug/kg/min via INTRAVENOUS
  Filled 2019-01-03: qty 100
  Filled 2019-01-03: qty 200
  Filled 2019-01-03 (×5): qty 100

## 2019-01-03 MED ORDER — DOCUSATE SODIUM 50 MG/5ML PO LIQD
100.0000 mg | Freq: Two times a day (BID) | ORAL | Status: DC | PRN
  Administered 2019-01-05 – 2019-01-14 (×3): 100 mg
  Filled 2019-01-03 (×3): qty 10

## 2019-01-03 MED ORDER — LACTATED RINGERS IV BOLUS
1000.0000 mL | Freq: Once | INTRAVENOUS | Status: AC
  Administered 2019-01-03: 1000 mL via INTRAVENOUS

## 2019-01-03 MED ORDER — LEVETIRACETAM IN NACL 1500 MG/100ML IV SOLN
1500.0000 mg | Freq: Two times a day (BID) | INTRAVENOUS | Status: DC
  Administered 2019-01-03 – 2019-01-08 (×11): 1500 mg via INTRAVENOUS
  Filled 2019-01-03 (×11): qty 100

## 2019-01-03 MED ORDER — SODIUM CHLORIDE 0.9 % IV SOLN
3.0000 g | Freq: Three times a day (TID) | INTRAVENOUS | Status: DC
  Administered 2019-01-03 – 2019-01-06 (×10): 3 g via INTRAVENOUS
  Filled 2019-01-03 (×10): qty 3

## 2019-01-03 MED ORDER — PHENYLEPHRINE HCL-NACL 10-0.9 MG/250ML-% IV SOLN
25.0000 ug/min | INTRAVENOUS | Status: DC
  Administered 2019-01-03: 40 ug/min via INTRAVENOUS
  Administered 2019-01-03: 15 ug/min via INTRAVENOUS
  Filled 2019-01-03: qty 250
  Filled 2019-01-03: qty 500

## 2019-01-03 MED ORDER — PHENYLEPHRINE HCL-NACL 10-0.9 MG/250ML-% IV SOLN
INTRAVENOUS | Status: AC
  Administered 2019-01-03: 40 ug/min via INTRAVENOUS
  Filled 2019-01-03: qty 250

## 2019-01-03 NOTE — Progress Notes (Signed)
eLink Physician-Brief Progress Note Patient Name: Erika Wang DOB: 18-Aug-1951 MRN: [id number]   Date of Service  01/03/2019  HPI/Events of Note  68 yo F with status epilepticus transferred from 16 W to 4N ICU for closer monitoring and possible intubation to allow more aggressive suppression of intractable seizures.  eICU Interventions  New patient evaluation completed. PCCM night rounding team following. Anti-epileptic management per neurology.        Thomasene Lot Tila Millirons 01/03/2019, 12:14 AM

## 2019-01-03 NOTE — Progress Notes (Signed)
PT Cancellation Note  Patient Details Name: Erika Wang MRN: [id number] DOB: [birth year]   Cancelled Treatment:    Reason Eval/Treat Not Completed: Medical issues which prohibited therapy.  Intubated, pressors (per OT note this AM).  PT to check back tomorrow.  Thanks,  Erika Wang. Erika Wang, PT, DPT  Acute Rehabilitation [phone] pager [phone]) [phone] office     Erika Wang B Erika Wang 01/03/2019, 3:10 PM

## 2019-01-03 NOTE — Progress Notes (Signed)
Patient's MS continued to decline and with need for more aggresive treatment, decision was made to proceed with intubation. She was moved to 4N and intubated.   I have started versed and will titrate to burst suppression/seizure suppression.   With initiation of versed she became mildly hypotensive and neosynephrine/fluids were started with good response.   She is now on phenytoin 100mg  TID, vimpat 200mg  BID, keppra 1500mg  BID.   This patient is critically ill and at significant risk of neurological worsening, death and care requires constant monitoring of vital signs, hemodynamics,respiratory and cardiac monitoring, neurological assessment, discussion with family, other specialists and medical decision making of high complexity. I spent 65 minutes of neurocritical care time  in the care of  this patient. This was time spent independent of any time provided by nurse practitioner or PA.  Ritta Slot, MD [facility] [phone]  If 7pm- 7am, please page neurology on call as listed in AMION. 01/03/2019  12:49 AM

## 2019-01-03 NOTE — Progress Notes (Signed)
Reason for consult: Status Epilepticus  Subjective: Intubated and sedated.    ROS: Unable to obtain due to poor mental status  Examination  Vital signs in last 24 hours: Temp:  [91.4 F (33 C)-98.4 F (36.9 C)] 92.8 F (33.8 C) (03/06 1200) Pulse Rate:  [54-134] 59 (03/06 1200) Resp:  [13-27] 15 (03/06 1200) BP: (79-191)/(54-110) 107/67 (03/06 1200) SpO2:  [91 %-100 %] 100 % (03/06 1200) FiO2 (%):  [30 %-100 %] 30 % (03/06 1200) Weight:  [59 kg] 59 kg (03/05 1715)  General: lying in bed CVS: pulse-normal rate and rhythm RS: breathing comfortably Extremities: normal   Neuro: Mental Status: Patient does not respond to verbal stimuli.  Does not respond to deep sternal rub.  Does not follow commands.  No verbalizations noted.  Cranial Nerves: II: patient does not respond confrontation bilaterally, pupils not reactive, right 3 mm, left 40mm III,IV,VI: doll's response absent bilaterally.  V,VII: corneal reflex: absent  VIII: patient does not respond to verbal stimuli IX,X: gag reflex absent  XI: trapezius strength unable to test bilaterally XII: tongue strength unable to test Motor: Extremities flaccid throughout.  No spontaneous movement noted.  No purposeful movements noted.  Sensory: Does not respond to noxious stimuli in any extremity. Deep Tendon Reflexes:  Absent throughout. Plantars: mute  Cerebellar: Unable to perform   Basic Metabolic Panel: Recent Labs  Lab 01/02/19 1127 01/02/19 1526 01/03/19 0051 01/03/19 0239  NA 137  --  141 140  K 4.2  --  3.6 3.5  CL 98  --   --  109  CO2 25  --   --  23  GLUCOSE 118*  --   --  139*  BUN 20  --   --  14  CREATININE 1.01*  --   --  0.83  CALCIUM 10.0  --   --  8.2*  MG  --  2.0  --   --   PHOS  --  2.5  --   --     CBC: Recent Labs  Lab 01/02/19 1127 01/03/19 0051 01/03/19 0239  WBC 11.3*  --  12.6*  NEUTROABS 8.5*  --   --   HGB 14.9 10.5* 10.7*  HCT 46.6* 31.0* 33.4*  MCV 92.5  --  93.0  PLT 247   --  194     Coagulation Studies: No results for input(s): LABPROT, INR in the last 72 hours.   DAY #1: from 1457 01/02/19 to 0730 01/03/19 BACKGROUND: An overall medium voltage recording with emergence of a burst-suppression pattern overnight. Initially there was background 2-7Hz  activity bilaterally, slower on the right, with superimposed lateralized periodic discharges (LPDs) as below. These discharges resolved with increased sedation. Periods of voltage attenuation lasted 2-8 seconds overnight with short bursts of 2-6Hz  activity.  EPILEPTIFORM/PERIODIC ACTIVITY: There were initially continuous LPD-plus discharges in the right posterior temporal region with spike/polyspike morphology, frequency 1Hz  without discrete ictal evolution but with overriding fast activity. The patient was reported to have left hemiparesis throughout.   SEIZURES: Though no discrete ictal evolution was seen with LPD-plus activity on the right, the morphology raised concern for a focal non-convulsive status epilepticus pattern.  EVENTS: none reported  EKG: no significant arrhythmia  SUMMARY: This was an abnormal continuous EEG due to initially a right focal non-convulsive status epilepticus pattern with LPD-plus activity. This resolved with increased sedation with emergence of a burst-suppression pattern overnight.     ASSESSMENT AND PLAN  77 y Female with past medical  history of stroke in 2014, hypertension hyperlipidemia presents to the emergency room after being found down.  Last seen normal was 2 days ago by her family. On arrival to emergency department patient had right gaze deviation and left hemiparesis and neurology was consulted.  Immediate CT head was obtained which showed no large acute right MCA stroke-stat CT angiogram negative for LVO and CT perfusion negative for any perfusion deficit.  Due to concern for patient may be in status, stat EEG was obtained which showed right hemispheric seizures.  Patient  was loaded with Keppra and then Fosphenytoin, however continued to seize and intubated for airway protection and burst suppression. Vimpat also added last night.   Today patient is burst suppressed. Weaned Propofol as she was excessively suppressed.   Recommendations Continue LTM EEG with goal of burst suppression for 24-48 hours  Continue Vimpat, Keppra and Dilantin  Continue Propofol and Versed gtt, titration based on EEG      This patient is neurologically critically ill due to status epilepticus.  She is at risk for significant risk of neurological worsening from worsening seizures, heart failure, infection, respiratory failure and complications of ICU stay. This patient's care requires constant monitoring of vital signs, hemodynamics, respiratory and cardiac monitoring, review of multiple databases, reviewing EEG multiple times and titration of sedation,  neurological assessment, discussion with family, other specialists and medical decision making of high complexity.  I spent 45  minutes of neurocritical time in the care of this patient.     Georgiana Spinner Kelby Lotspeich [facility] Pager Number [phone] For questions after 7pm please refer to AMION to reach the Neurologist on call

## 2019-01-03 NOTE — Progress Notes (Signed)
Urine output from catheter this shift, CCM notified. Verbal order received for 1liter LR bolus.

## 2019-01-03 NOTE — Progress Notes (Signed)
LTM EEG checked, paste added to Fz, Cz, Pz, T3, T5, O2, Fp1. No skin breakdown noted.

## 2019-01-03 NOTE — Progress Notes (Signed)
SLP Cancellation Note  Patient Details Name: Jacquette Yrigoyen MRN: [id number] DOB: 01/28/1951   Cancelled treatment:       Reason Eval/Treat Not Completed: Patient not medically ready;Medical issues which prohibited therapy. Pt is currently intubated. SLP will follow up.   Nasim Garofano I. Vear Clock, MS, CCC-SLP Acute Rehabilitation Services Office number [phone] Pager [phone]  Scheryl Marten 01/03/2019, 2:08 PM

## 2019-01-03 NOTE — Procedures (Signed)
Intubation Procedure Note Erika Wang [id number] [birth year]  Procedure: Intubation Indications: Airway protection and maintenance  Procedure Details Consent: Unable to obtain consent because of altered level of consciousness. Time Out: Verified patient identification, verified procedure, site/side was marked, verified correct patient position, special equipment/implants available, medications/allergies/relevent history reviewed, required imaging and test results available.  Performed  Maximum sterile technique was used including gloves, gown, hand hygiene and mask.  MAC and 3  ETT 25 at lip size 7.5  Evaluation Hemodynamic Status: BP stable throughout; O2 sats: stable throughout Patient's Current Condition: stable Complications: No apparent complications Patient did tolerate procedure well. Chest X-ray ordered to verify placement.  CXR: pending.   Erika Wang Erika Wang 01/03/2019

## 2019-01-03 NOTE — Progress Notes (Signed)
PHARMACY - PHYSICIAN COMMUNICATION CRITICAL VALUE ALERT - BLOOD CULTURE IDENTIFICATION (BCID)  Erika Wang is an 68 y.o. female who presented to [facility] on 01/02/2019.  Assessment:  Blood cultures with 2/2 MSSE  Name of physician (or Provider) Contacted: CCM  Current antibiotics: Unasyn Keep broad for now while also covering for aspiration pneumonia  Changes to prescribed antibiotics recommended:  Patient is on recommended antibiotics - No changes needed  Results for orders placed or performed during the hospital encounter of 01/02/19  Blood Culture ID Panel (Reflexed) (Collected: 01/02/2019 11:58 AM)  Result Value Ref Range   Enterococcus species NOT DETECTED NOT DETECTED   Listeria monocytogenes NOT DETECTED NOT DETECTED   Staphylococcus species DETECTED (A) NOT DETECTED   Staphylococcus aureus (BCID) NOT DETECTED NOT DETECTED   Methicillin resistance NOT DETECTED NOT DETECTED   Streptococcus species NOT DETECTED NOT DETECTED   Streptococcus agalactiae NOT DETECTED NOT DETECTED   Streptococcus pneumoniae NOT DETECTED NOT DETECTED   Streptococcus pyogenes NOT DETECTED NOT DETECTED   Acinetobacter baumannii NOT DETECTED NOT DETECTED   Enterobacteriaceae species NOT DETECTED NOT DETECTED   Enterobacter cloacae complex NOT DETECTED NOT DETECTED   Escherichia coli NOT DETECTED NOT DETECTED   Klebsiella oxytoca NOT DETECTED NOT DETECTED   Klebsiella pneumoniae NOT DETECTED NOT DETECTED   Proteus species NOT DETECTED NOT DETECTED   Serratia marcescens NOT DETECTED NOT DETECTED   Haemophilus influenzae NOT DETECTED NOT DETECTED   Neisseria meningitidis NOT DETECTED NOT DETECTED   Pseudomonas aeruginosa NOT DETECTED NOT DETECTED   Candida albicans NOT DETECTED NOT DETECTED   Candida glabrata NOT DETECTED NOT DETECTED   Candida krusei NOT DETECTED NOT DETECTED   Candida parapsilosis NOT DETECTED NOT DETECTED   Candida tropicalis NOT DETECTED NOT DETECTED    [name] 01/03/2019  9:57 AM

## 2019-01-03 NOTE — Procedures (Signed)
LTM-EEG Report  HISTORY: Continuous video-EEG monitoring performed for 68 year old with AMS, L hemiparesis.  ACQUISITION: International 10-20 system for electrode placement; 18 channels with additional eyes linked to ipsilateral ears and EKG. Additional T1-T2 electrodes were used. Continuous video recording obtained.   EEG NUMBER:  MEDICATIONS:  Day 1: see EMR  DAY #1: from 1457 01/02/19 to 0730 01/03/19 BACKGROUND: An overall medium voltage recording with emergence of a burst-suppression pattern overnight. Initially there was background 2-7Hz  activity bilaterally, slower on the right, with superimposed lateralized periodic discharges (LPDs) as below. These discharges resolved with increased sedation. Periods of voltage attenuation lasted 2-8 seconds overnight with short bursts of 2-6Hz  activity.  EPILEPTIFORM/PERIODIC ACTIVITY: There were initially continuous LPD-plus discharges in the right posterior temporal region with spike/polyspike morphology, frequency 1Hz  without discrete ictal evolution but with overriding fast activity. The patient was reported to have left hemiparesis throughout.   SEIZURES: Though no discrete ictal evolution was seen with LPD-plus activity on the right, the morphology raised concern for a focal non-convulsive status epilepticus pattern.  EVENTS: none reported  EKG: no significant arrhythmia  SUMMARY: This was an abnormal continuous EEG due to initially a right focal non-convulsive status epilepticus pattern with LPD-plus activity. This resolved with increased sedation with emergence of a burst-suppression pattern overnight.

## 2019-01-03 NOTE — Progress Notes (Signed)
eLink Physician-Brief Progress Note Patient Name: Erika Wang DOB: [birth year] MRN: [id number]   Date of Service  01/03/2019  HPI/Events of Note  Urinary retention with 500 ml of urine on bladder scan  eICU Interventions  In-dwelling foley catheter order entered        Migdalia Dk 01/03/2019, 5:47 AM

## 2019-01-03 NOTE — Procedures (Signed)
Central Venous Catheter Insertion Procedure Note Erika Wang [id number] 09-11-1951  Procedure: Insertion of Central Venous Catheter Indications: Assessment of intravascular volume and Drug and/or fluid administration  Procedure Details Consent: Unable to obtain consent because of emergent medical necessity. Time Out: Verified patient identification, verified procedure, site/side was marked, verified correct patient position, special equipment/implants available, medications/allergies/relevent history reviewed, required imaging and test results available.  Performed  Maximum sterile technique was used including antiseptics, cap, gloves, gown, hand hygiene, mask and sheet. Skin prep: Chlorhexidine; local anesthetic administered A antimicrobial bonded/coated triple lumen catheter was placed in the left internal jugular vein using the Seldinger technique.  Evaluation Blood flow good Complications: No apparent complications Patient did tolerate procedure well. Chest X-ray ordered to verify placement.  CXR: pending.  Procedure done with ultrasound for live time visualization of the vessel during insertion. Line was placed under the supervision of Dr. Mechele Collin.  Patient tolerated procedure well. CXR is pending.  [name], AGACNP-BC [facility] 01/03/2019, 12:24 PM

## 2019-01-03 NOTE — Progress Notes (Addendum)
NAMEBritain Wang, MRN:  [id number], DOB:  12-31-1950, LOS: 1 ADMISSION DATE:  01/02/2019, CONSULTATION DATE:  3/5 REFERRING MD:  Dr. Jerral Ralph, CHIEF COMPLAINT:  Status epilepticus   Brief History   68 year old female admitted with AMS found to have status epilepticus refractory to the AEDs and required transfer to ICU for intubation and continuous infusion.   History of present illness   Patient is encephalopathic and/or intubated. Therefore history has been obtained from chart review. 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 She was last seen well by family 3/3 and had no complaints, but seemed to be a bit off balance. Then 3/5 she was found down with R gaze and was unresponsive. Initial imaging was unremarkable with the exception of suspected aspiration PNA RUL. EEG, however, demonstrated seizure. She was treated with Keppra and CAP coverage and admitted for continuous EEG. Despite escalating seizure medication she remained unresponsive with seizure on EEG. PCCM was consulted to transfer to ICU and possible intubation.   Past Medical History   has a past medical history of High cholesterol, Hypertension, and Stroke (HCC).   Significant Hospital Events   3/5 admit to floor, then ICU transfer.  3/6 intubated  Consults:  Neurology  Procedures:  ETT 3/6>>  Significant Diagnostic Tests:  CT head 3/5 > small vessel disease. No acute cortically based infarct or hemorrhage.  CT angio and perfusion 3/5 > negative large vessel occlusion. No core infarct or penumbra. Moderate to severe ICA stenosis. RUL pneumonia partially visible.  CT C-spine 3/5 > No cervical spine fracture or subluxation. Mild multilevel degenerative changes. EEG 3/5 >Continuous epileptiform discharge involving the right temporal region meeting the electrographic criteria for for status epilepticus  Micro Data:  Blood 3/5 >  Tracheal aspirate   3/5 >  Antimicrobials:  Ceftriaxone 3/5  Flagyl 3/5  Unasyn 3/5 >>>  Interim history/subjective:  Transferred to ICU and intubated overnight.   Objective   Blood pressure 92/80, pulse (!) 54, temperature (!) 91.4 F (33 C), temperature source Rectal, resp. rate 18, height 5' (1.524 m), weight 59 kg, SpO2 100 %.    Vent Mode: PRVC FiO2 (%):  [30 %-100 %] 30 % Set Rate:  [15 bmp] 15 bmp Vt Set:  [360 mL] 360 mL PEEP:  [5 cmH20] 5 cmH20 Plateau Pressure:  [13 cmH20-16 cmH20] 14 cmH20   Intake/Output Summary (Last 24 hours) at 01/03/2019 5520 Last data filed at 01/03/2019 0800 Gross per 24 hour  Intake 3047.01 ml  Output 100 ml  Net 2947.01 ml   Filed Weights   01/02/19 1715  Weight: 59 kg    Physical Exam: General: Thin, female laying in bed on mechanical ventilation HENT: [place], AT, ETT in place, EEG leads in place Eyes: EOMI, no scleral icterus Respiratory: Clear to auscultation bilaterally.  No crackles, wheezing or rales Cardiovascular: RRR, -M/R/G, no JVD GI: BS+, soft, nontender Extremities:-Edema,-tenderness Neuro: Sedated Skin: Intact, no rashes or bruising GU: Foley in place  Resolved Hospital Problem list     Assessment & Plan:   Status epilepticus - AED management per neurology - On continuous sedation infusion for burst suppression - Serial neurochecks - Continuous EEG - Will need CVC for administration of medications  Acute hypoxemic respiratory insufficiency due to inability to protect airway On minimal vent settings. Remain intubated due to SE and sedation Plan: - Full vent support - Propofol for  RASS goal 0 to -1, but will defer goal/dosing to neurology if required for burst suppression - VAP bundle - Repeat CXR to check ETT placement after retraction overnight  Sepsis shock secondary to GPC bacteremia and suspected aspiration pneumonia Hypothermic, mild leukocytosis Plan: - Continue pressor support to maintain MAPs >65 - TTE ordered -  Continue Unasyn - Repeat BCX - F/u final BCx and RQ - Warming blanket  Hypertension Hyperlipidemia - Will hold home diltiazem, atorvastatin until taking PO  Possible CVA - Await MRI - Neurology following   Best practice:  Diet: NPO Pain/Anxiety/Delirium protocol (if indicated): Propofol and Versed per PAD protocol VAP protocol (if indicated): Yes DVT prophylaxis: enoxaparin  GI prophylaxis: N/a  Glucose control: N/a Mobility: BR Code Status: FULL Family Communication: No family at bedside. Unable to reach daughter via cell number provided in system. Disposition: ICU  Labs   CBC: Recent Labs  Lab 01/02/19 1127 01/03/19 0051 01/03/19 0239  WBC 11.3*  --  12.6*  NEUTROABS 8.5*  --   --   HGB 14.9 10.5* 10.7*  HCT 46.6* 31.0* 33.4*  MCV 92.5  --  93.0  PLT 247  --  194    Basic Metabolic Panel: Recent Labs  Lab 01/02/19 1127 01/02/19 1526 01/03/19 0051 01/03/19 0239  NA 137  --  141 140  K 4.2  --  3.6 3.5  CL 98  --   --  109  CO2 25  --   --  23  GLUCOSE 118*  --   --  139*  BUN 20  --   --  14  CREATININE 1.01*  --   --  0.83  CALCIUM 10.0  --   --  8.2*  MG  --  2.0  --   --   PHOS  --  2.5  --   --    GFR: Estimated Creatinine Clearance: 54.3 mL/min (by C-G formula based on SCr of 0.83 mg/dL). Recent Labs  Lab 01/02/19 1127 01/02/19 1358 01/03/19 0102 01/03/19 0239  PROCALCITON  --   --  0.18 0.17  WBC 11.3*  --   --  12.6*  LATICACIDVEN  --  1.6  --   --     Liver Function Tests: Recent Labs  Lab 01/02/19 1127  AST 34  ALT 16  ALKPHOS 101  BILITOT 0.7  PROT 8.1  ALBUMIN 3.2*   No results for input(s): LIPASE, AMYLASE in the last 168 hours. Recent Labs  Lab 01/02/19 1526  AMMONIA 15    ABG    Component Value Date/Time   PHART 7.380 01/03/2019 0051   PCO2ART 44.8 01/03/2019 0051   PO2ART 432.0 (H) 01/03/2019 0051   HCO3 26.6 01/03/2019 0051   TCO2 28 01/03/2019 0051   O2SAT 100.0 01/03/2019 0051     Coagulation  Profile: No results for input(s): INR, PROTIME in the last 168 hours.  Cardiac Enzymes: Recent Labs  Lab 01/02/19 1127  CKTOTAL 534*    HbA1C: Hgb A1c MFr Bld  Date/Time Value Ref Range Status  01/03/2019 02:39 AM 5.9 (H) 4.8 - 5.6 % Final    Comment:    (NOTE) Pre diabetes:          5.7%-6.4% Diabetes:              >6.4% Glycemic control for   <7.0% adults with diabetes   09/29/2013 04:15 AM 5.9 (H) <5.7 % Final    Comment:    (NOTE)  According to the ADA Clinical Practice Recommendations for 2011, when HbA1c is used as a screening test:  >=6.5%   Diagnostic of Diabetes Mellitus           (if abnormal result is confirmed) 5.7-6.4%   Increased risk of developing Diabetes Mellitus References:Diagnosis and Classification of Diabetes Mellitus,Diabetes Care,2011,34(Suppl 1):S62-S69 and Standards of Medical Care in         Diabetes - 2011,Diabetes Care,2011,34 (Suppl 1):S11-S61.    CBG: Recent Labs  Lab 01/02/19 1134  GLUCAP 97    Critical care time: 43 min  The patient is critically ill with multiple organ systems failure and requires high complexity decision making for assessment and support, frequent evaluation and titration of therapies, application of advanced monitoring technologies and extensive interpretation of multiple databases.   Critical Care Time devoted to patient care services described in this note is 43 Minutes. This time reflects time of care of this signee Dr. Mechele CollinJane Anyra Kaufman. This critical care time does not reflect procedure time, or teaching time or supervisory time of PA/NP/Med student/Med Resident etc but could involve care discussion time.  Mechele CollinJane Norvil Martensen, M.D. [facility]/Critical Care Medicine Pager: [phone] After hours pager: [phone]

## 2019-01-03 NOTE — Progress Notes (Signed)
OT Cancellation Note  Patient Details Name: Erika Wang MRN: [id number] DOB: 09-17-1951   Cancelled Treatment:    Reason Eval/Treat Not Completed: Patient not medically ready; intubated, pressors; will follow up for OT eval as able.   Marcy Siren, OT Supplemental Rehabilitation Services Pager [phone] Office [phone]   Orlando Penner 01/03/2019, 10:39 AM

## 2019-01-03 NOTE — Progress Notes (Signed)
  Echocardiogram 2D Echocardiogram has been performed.  Erika Wang G Gee Habig 01/03/2019, 2:26 PM

## 2019-01-03 NOTE — Significant Event (Addendum)
Rapid Response Event Note  I was made aware that patient needed to be transferred to the ICU for seizure management and intubation. I was not called by 5W RN or 5W staff. When I arrived, I asked the 5W RN to call a report to the ICU and another nurse and I urgently transferred the patient to the ICU. Upon arrival to the ICU, I  established PIV x 1, patient underwent RSI, successfully intubated, and I was able to start a 2nd PIV.   RRT INTERVENTIONS: -- Urgent transfer to NTICU -- PIV x 2 established   Saoirse Legere R

## 2019-01-03 NOTE — Progress Notes (Signed)
Initial Nutrition Assessment  DOCUMENTATION CODES:   Not applicable  INTERVENTION:   Once tube feeds initiated:   Recommend Vital HP @ goal rate of 65ml/hr  Propofol: 7.1 ml/hr- provides 187kcal/day  Free water flushes 52ml q4 hours to maintain tube patency   Regimen provides 1147kcal/day, 84g/day protein, 947ml/day free water  Recommend liquid MVI daily via tube   NUTRITION DIAGNOSIS:   Inadequate oral intake related to inability to eat(pt sedated and ventilated ) as evidenced by NPO status.  GOAL:   Provide needs based on ASPEN/SCCM guidelines  MONITOR:   Vent status, Labs, Weight trends, Skin, I & O's  REASON FOR ASSESSMENT:   Ventilator    ASSESSMENT:   68 y.o. 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 Pt found to have PNA with sepsis. Pt now intubated s/p seizure.     Pt sedated and ventilated. No family at bedside. OGT in place with side port at the GE-junction; RN notified. No plans for tube feeds at this time. There is no recent weight history in chart to confirm if any significant weight loss. Suspect pt at moderate refeed; recommend monitor K, Mg and P labs daily.   Medications reviewed and include: lovenox, protonix, unasyn, NaCl w/ 5% dextrose @100ml /hr, neosynephrine, propofol  Labs reviewed: K 3.5 wnl P 2.5 wnl, Mg 2.0 wnl- 3/5 Wbc- 12.6(H), Hgb 10.7(L), Hct 33.4(L)  Patient is currently intubated on ventilator support MV: 5.3 L/min Temp (24hrs), Avg:95.5 F (35.3 C), Min:91.4 F (33 C), Max:98.4 F (36.9 C)  Propofol: 7.1 ml/hr- provides 187kcal/day   MAP- >21mmHg  UOP-  NUTRITION - FOCUSED PHYSICAL EXAM:    Most Recent Value  Orbital Region  No depletion  Upper Arm Region  No depletion  Thoracic and Lumbar Region  No depletion  Buccal Region  No depletion  Temple  Region  Mild depletion  Clavicle Bone Region  Moderate depletion  Clavicle and Acromion Bone Region  Moderate depletion  Scapular Bone Region  Unable to assess  Dorsal Hand  No depletion  Patellar Region  No depletion  Anterior Thigh Region  No depletion  Posterior Calf Region  No depletion  Edema (RD Assessment)  None  Hair  Reviewed  Eyes  Reviewed  Mouth  Reviewed  Skin  Reviewed  Nails  Reviewed     Diet Order:   Diet Order            Diet NPO time specified  Diet effective now             EDUCATION NEEDS:   No education needs have been identified at this time  Skin:  Skin Assessment: Reviewed RN Assessment(MASD, sacral wound)  Last BM:  PTA  Height:   Ht Readings from Last 1 Encounters:  01/02/19 5' (1.524 m)    Weight:   Wt Readings from Last 1 Encounters:  01/02/19 59 kg    Ideal Body Weight:  45.4 kg  BMI:  Body mass index is 25.39 kg/m.  Estimated Nutritional Needs:   Kcal:  1133kcal/day   Protein:  77-88g/day   Fluid:  1.4L/day   Betsey Holiday MS, RD, LDN Pager #- [phone] Office#- [phone] After Hours Pager: [phone]

## 2019-01-04 DIAGNOSIS — R7881 Bacteremia: Secondary | ICD-10-CM

## 2019-01-04 LAB — GLUCOSE, CAPILLARY
Glucose-Capillary: 104 mg/dL — ABNORMAL HIGH (ref 70–99)
Glucose-Capillary: 105 mg/dL — ABNORMAL HIGH (ref 70–99)
Glucose-Capillary: 122 mg/dL — ABNORMAL HIGH (ref 70–99)
Glucose-Capillary: 129 mg/dL — ABNORMAL HIGH (ref 70–99)
Glucose-Capillary: 67 mg/dL — ABNORMAL LOW (ref 70–99)
Glucose-Capillary: 83 mg/dL (ref 70–99)

## 2019-01-04 LAB — CBC
HEMATOCRIT: 28.3 % — AB (ref 36.0–46.0)
Hemoglobin: 8.8 g/dL — ABNORMAL LOW (ref 12.0–15.0)
MCH: 30.2 pg (ref 26.0–34.0)
MCHC: 31.1 g/dL (ref 30.0–36.0)
MCV: 97.3 fL (ref 80.0–100.0)
NRBC: 0 % (ref 0.0–0.2)
Platelets: 174 10*3/uL (ref 150–400)
RBC: 2.91 MIL/uL — ABNORMAL LOW (ref 3.87–5.11)
RDW: 15.2 % (ref 11.5–15.5)
WBC: 5.8 10*3/uL (ref 4.0–10.5)

## 2019-01-04 LAB — BASIC METABOLIC PANEL
ANION GAP: 7 (ref 5–15)
BUN: 8 mg/dL (ref 8–23)
CO2: 18 mmol/L — ABNORMAL LOW (ref 22–32)
Calcium: 7.5 mg/dL — ABNORMAL LOW (ref 8.9–10.3)
Chloride: 118 mmol/L — ABNORMAL HIGH (ref 98–111)
Creatinine, Ser: 0.83 mg/dL (ref 0.44–1.00)
GFR calc Af Amer: >60 mL/min (ref >60)
GFR calc non Af Amer: >60 mL/min (ref >60)
Glucose, Bld: 138 mg/dL — ABNORMAL HIGH (ref 70–99)
POTASSIUM: 2.9 mmol/L — AB (ref 3.5–5.1)
Sodium: 143 mmol/L (ref 135–145)

## 2019-01-04 LAB — PHOSPHORUS
Phosphorus: 2.4 mg/dL — ABNORMAL LOW (ref 2.5–4.6)
Phosphorus: 2.4 mg/dL — ABNORMAL LOW (ref 2.5–4.6)

## 2019-01-04 LAB — LACTATE DEHYDROGENASE: LDH: 100 U/L (ref 98–192)

## 2019-01-04 LAB — PROCALCITONIN: Procalcitonin: 0.13 ng/mL

## 2019-01-04 LAB — MAGNESIUM: Magnesium: 1.5 mg/dL — ABNORMAL LOW (ref 1.7–2.4)

## 2019-01-04 LAB — PHENYTOIN LEVEL, TOTAL: Phenytoin Lvl: 14.8 ug/mL (ref 10.0–20.0)

## 2019-01-04 MED ORDER — VITAL HIGH PROTEIN PO LIQD
1000.0000 mL | ORAL | Status: DC
  Administered 2019-01-04 – 2019-01-07 (×4): 1000 mL

## 2019-01-04 MED ORDER — ADULT MULTIVITAMIN LIQUID CH
15.0000 mL | Freq: Every day | ORAL | Status: DC
  Administered 2019-01-04 – 2019-01-31 (×27): 15 mL
  Filled 2019-01-04 (×27): qty 15

## 2019-01-04 MED ORDER — POTASSIUM CHLORIDE 20 MEQ/15ML (10%) PO SOLN
40.0000 meq | ORAL | Status: AC
  Administered 2019-01-04 (×2): 40 meq
  Filled 2019-01-04 (×2): qty 30

## 2019-01-04 MED ORDER — DEXTROSE 50 % IV SOLN
25.0000 mL | Freq: Once | INTRAVENOUS | Status: AC
  Administered 2019-01-04: 25 mL via INTRAVENOUS
  Filled 2019-01-04: qty 50

## 2019-01-04 MED ORDER — MAGNESIUM SULFATE 2 GM/50ML IV SOLN
2.0000 g | Freq: Once | INTRAVENOUS | Status: AC
  Administered 2019-01-04: 2 g via INTRAVENOUS
  Filled 2019-01-04: qty 50

## 2019-01-04 MED ORDER — PRO-STAT SUGAR FREE PO LIQD
30.0000 mL | Freq: Two times a day (BID) | ORAL | Status: DC
  Administered 2019-01-04 – 2019-01-08 (×9): 30 mL
  Filled 2019-01-04 (×9): qty 30

## 2019-01-04 NOTE — Plan of Care (Signed)
Tube feeding will be started today per Dr. Everardo All

## 2019-01-04 NOTE — Progress Notes (Signed)
NAMEBriaunna Wang, MRN:  [id number], DOB:  11/21/1950, LOS: 2 ADMISSION DATE:  01/02/2019, CONSULTATION DATE:  3/5 REFERRING MD:  Dr. Jerral Ralph, CHIEF COMPLAINT:  Status epilepticus   Brief History   68 year old female admitted with AMS found to have status epilepticus refractory to the AEDs and required transfer to ICU for intubation and continuous infusion.   History of present illness   Patient is encephalopathic and/or intubated. Therefore history has been obtained from chart review. 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 She was last seen well by family 3/3 and had no complaints, but seemed to be a bit off balance. Then 3/5 she was found down with R gaze and was unresponsive. Initial imaging was unremarkable with the exception of suspected aspiration PNA RUL. EEG, however, demonstrated seizure. She was treated with Keppra and CAP coverage and admitted for continuous EEG. Despite escalating seizure medication she remained unresponsive with seizure on EEG. PCCM was consulted to transfer to ICU and possible intubation.   Past Medical History   has a past medical history of High cholesterol, Hypertension, and Stroke (HCC).   Significant Hospital Events   3/5 admit to floor, then ICU transfer.  3/6 intubated  Consults:  Neurology  Procedures:  ETT 3/6>>  Significant Diagnostic Tests:  CT head 3/5 > small vessel disease. No acute cortically based infarct or hemorrhage.  CT angio and perfusion 3/5 > negative large vessel occlusion. No core infarct or penumbra. Moderate to severe ICA stenosis. RUL pneumonia partially visible.  CT C-spine 3/5 > No cervical spine fracture or subluxation. Mild multilevel degenerative changes. EEG 3/5 >Continuous epileptiform discharge involving the right temporal region meeting the electrographic criteria for for status epilepticus  Micro Data:  Blood 3/5 >  Blood 3/6  > Tracheal aspirate  3/6>  Antimicrobials:  Ceftriaxone 3/5  Flagyl 3/5  Unasyn 3/5 >>>  Interim history/subjective:  Received IVF bolus for poor UOP last night. Remains sedated on propofol and versed gtt for burst suppression. On minimal pressor support.  Objective   Blood pressure 98/62, pulse 78, temperature 98.6 F (37 C), resp. rate 15, height 5' (1.524 m), weight 59 kg, SpO2 100 %.    Vent Mode: PRVC FiO2 (%):  [30 %] 30 % Set Rate:  [15 bmp] 15 bmp Vt Set:  [360 mL] 360 mL PEEP:  [5 cmH20] 5 cmH20 Plateau Pressure:  [14 cmH20] 14 cmH20   Intake/Output Summary (Last 24 hours) at 01/04/2019 0813 Last data filed at 01/04/2019 0600 Gross per 24 hour  Intake 5914.51 ml  Output 475 ml  Net 5439.51 ml   Filed Weights   01/02/19 1715  Weight: 59 kg    Physical Exam: General: Thin, female laying in bed on mechanical ventilation, sedated HENT: [place], AT, ETT in place Respiratory: Clear to auscultation bilaterally.  No crackles, wheezing or rales Cardiovascular: RRR, -M/R/G, no JVD GI: BS+, soft, nontender Extremities:-Edema,-tenderness Neuro: Sedated Skin: Intact, no rashes or bruising GU: Foley in place  CXR 01/04/19 - Patchy airspace disease on right side in RUL and RML, ETT appropriate position, interval placement of L IJ CVC  Resolved Hospital Problem list     Assessment & Plan:   Status epilepticus - AED management per neurology - On continuous sedation infusion for burst suppression - Serial neurochecks - Continuous EEG  Acute hypoxemic respiratory insufficiency due to inability to protect airway On minimal  vent settings. Remain intubated due to SE and sedation Plan: - Full vent support. No plan for wean at this time. - Propofol and versed gtt for RASS goal 0 to -1, but will defer goal/dosing to neurology if required for burst suppression - VAP bundle  Sepsis shock secondary to GPC bacteremia and suspected aspiration pneumonia Shock improving. Only on  minimal pressor support that is likely related to sedation. TTE with normal EF, no vegetations. Plan: - Continue minimal pressor support to maintain MAPs >65 - Continue Unasyn - F/u GPC speciation and sensitivities. F/u repeat BCx for clearance - Warming blanket  Hypertension Hyperlipidemia - Will hold home diltiazem, atorvastatin until taking PO  Possible CVA - Neurology following  Poor UOP No AKI on recent labs. Bladder scan with 14cc. - F/u BMP - Maintain foley  Best practice:  Diet: NPO Pain/Anxiety/Delirium protocol (if indicated): Propofol and Versed per PAD protocol VAP protocol (if indicated): Yes DVT prophylaxis: enoxaparin  GI prophylaxis: N/a  Glucose control: N/a Mobility: BR Code Status: FULL Family Communication: No family at bedside Disposition: ICU  Labs   CBC: Recent Labs  Lab 01/02/19 1127 01/03/19 0051 01/03/19 0239  WBC 11.3*  --  12.6*  NEUTROABS 8.5*  --   --   HGB 14.9 10.5* 10.7*  HCT 46.6* 31.0* 33.4*  MCV 92.5  --  93.0  PLT 247  --  194    Basic Metabolic Panel: Recent Labs  Lab 01/02/19 1127 01/02/19 1526 01/03/19 0051 01/03/19 0239  NA 137  --  141 140  K 4.2  --  3.6 3.5  CL 98  --   --  109  CO2 25  --   --  23  GLUCOSE 118*  --   --  139*  BUN 20  --   --  14  CREATININE 1.01*  --   --  0.83  CALCIUM 10.0  --   --  8.2*  MG  --  2.0  --   --   PHOS  --  2.5  --   --    GFR: Estimated Creatinine Clearance: 54.3 mL/min (by C-G formula based on SCr of 0.83 mg/dL). Recent Labs  Lab 01/02/19 1127 01/02/19 1358 01/03/19 0102 01/03/19 0239 01/03/19 1259 01/03/19 1650 01/04/19 0426  PROCALCITON  --   --  0.18 0.17  --   --  0.13  WBC 11.3*  --   --  12.6*  --   --   --   LATICACIDVEN  --  1.6  --   --  1.7 1.3  --     Liver Function Tests: Recent Labs  Lab 01/02/19 1127  AST 34  ALT 16  ALKPHOS 101  BILITOT 0.7  PROT 8.1  ALBUMIN 3.2*   No results for input(s): LIPASE, AMYLASE in the last 168  hours. Recent Labs  Lab 01/02/19 1526  AMMONIA 15    ABG    Component Value Date/Time   PHART 7.380 01/03/2019 0051   PCO2ART 44.8 01/03/2019 0051   PO2ART 432.0 (H) 01/03/2019 0051   HCO3 26.6 01/03/2019 0051   TCO2 28 01/03/2019 0051   O2SAT 100.0 01/03/2019 0051     Coagulation Profile: No results for input(s): INR, PROTIME in the last 168 hours.  Cardiac Enzymes: Recent Labs  Lab 01/02/19 1127  CKTOTAL 534*    HbA1C: Hgb A1c MFr Bld  Date/Time Value Ref Range Status  01/03/2019 02:39 AM 5.9 (H) 4.8 - 5.6 % Final  Comment:    (NOTE) Pre diabetes:          5.7%-6.4% Diabetes:              >6.4% Glycemic control for   <7.0% adults with diabetes   09/29/2013 04:15 AM 5.9 (H) <5.7 % Final    Comment:    (NOTE)                                                                       According to the ADA Clinical Practice Recommendations for 2011, when HbA1c is used as a screening test:  >=6.5%   Diagnostic of Diabetes Mellitus           (if abnormal result is confirmed) 5.7-6.4%   Increased risk of developing Diabetes Mellitus References:Diagnosis and Classification of Diabetes Mellitus,Diabetes Care,2011,34(Suppl 1):S62-S69 and Standards of Medical Care in         Diabetes - 2011,Diabetes Care,2011,34 (Suppl 1):S11-S61.    CBG: Recent Labs  Lab 01/02/19 1134  GLUCAP 97    Critical care time: 40 min  The patient is critically ill with multiple organ systems failure and requires high complexity decision making for assessment and support, frequent evaluation and titration of therapies, application of advanced monitoring technologies and extensive interpretation of multiple databases.   Critical Care Time devoted to patient care services described in this note is 40 Minutes. This time reflects time of care of this signee Dr. Mechele Collin. This critical care time does not reflect procedure time, or teaching time or supervisory time of PA/NP/Med student/Med  Resident etc but could involve care discussion time.  Mechele Collin, M.D. [facility] Pulmonary/Critical Care Medicine Pager: [phone] After hours pager: [phone]

## 2019-01-04 NOTE — Progress Notes (Signed)
Reason for consult: Status epilepticus  Subjective: Patient intubated and sedated.  Currently in burst suppression.  Blood pressures have remained stable on pressors.   ROS: unble to obtain due to mental status.  Examination  Vital signs in last 24 hours: Temp:  [92.8 F (33.8 C)-98.6 F (37 C)] 97.2 F (36.2 C) (03/07 1000) Pulse Rate:  [59-83] 72 (03/07 1000) Resp:  [12-39] 15 (03/07 1000) BP: (76-107)/(53-70) 91/57 (03/07 1133) SpO2:  [100 %] 100 % (03/07 1133) FiO2 (%):  [30 %] 30 % (03/07 1133)  General: lying in bed, intubated,obese CVS: pulse-normal rate and rhythm RS: breathing comfortably Extremities: normal   Neuro: Mental Status: Patient does not respond to verbal stimuli.  Does not respond to deep sternal rub.  Does not follow commands.  No verbalizations noted.  Cranial Nerves: II: patient does not respond confrontation bilaterally, pupils not reactive, right 3 mm, left 23mm III,IV,VI: doll's response absent bilaterally. V,VII: corneal reflex: absent VIII: patient does not respond to verbal stimuli IX,X: gag reflex: absent XI: trapezius strength unable to test bilaterally XII: tongue strength unable to test Motor: Extremities flaccid throughout.  No spontaneous movement noted.  No purposeful movements noted.  Sensory: Does not respond to noxious stimuli in any extremity. Deep Tendon Reflexes:  Absent throughout. Plantars: mute Cerebellar: Unable to perform   Basic Metabolic Panel: Recent Labs  Lab 01/02/19 1127 01/02/19 1526 01/03/19 0051 01/03/19 0239 01/04/19 0926  NA 137  --  141 140 143  K 4.2  --  3.6 3.5 2.9*  CL 98  --   --  109 118*  CO2 25  --   --  23 18*  GLUCOSE 118*  --   --  139* 138*  BUN 20  --   --  14 8  CREATININE 1.01*  --   --  0.83 0.83  CALCIUM 10.0  --   --  8.2* 7.5*  MG  --  2.0  --   --  1.5*  PHOS  --  2.5  --   --   --     CBC: Recent Labs  Lab 01/02/19 1127 01/03/19 0051 01/03/19 0239 01/04/19 0926  WBC  11.3*  --  12.6* 5.8  NEUTROABS 8.5*  --   --   --   HGB 14.9 10.5* 10.7* 8.8*  HCT 46.6* 31.0* 33.4* 28.3*  MCV 92.5  --  93.0 97.3  PLT 247  --  194 174     Coagulation Studies: No results for input(s): LABPROT, INR in the last 72 hours.  Imaging Reviewed:     ASSESSMENT AND PLAN  76 yFemale with past medical history of stroke in 2014, hypertension hyperlipidemia presents to the emergency room after being founddown.Last seen normal was 2 days ago by her family. On arrival to emergency department patient had right gaze deviation and left hemiparesis and neurology was consulted.Immediate CT head was obtained which showed no large acute right MCA stroke-stat CT angiogram negative for LVO and CT perfusion negative for any perfusion deficit. Due to concern for patient may be in status,stat EEG was obtained which showed right hemispheric seizures.  Patient was loaded with Keppra and then Fosphenytoin, however continued to seize and intubated for airway protection and burst suppression. Vimpat also added.  Patient has remained in burst suppression since 3/6.  We will continue to burst suppression 24 more hours and will start to wean sedation.  Noted to have gram-positive bacteremia and aspiration pneumonia.  Discussed with  intensivist and requested to avoid cefepime if possible.  Status epilepticus-currently burst suppressed   Recommendations Continue long-term EEG Continue burst suppression for 24 more hours, then start weaning propofol tomorrow Continue Vimpat Keppra and Dilantin, check Dilantin levels today Avoid cephalosporins if possible for treatment of sepsis Continue serial neurochecks  Neurology will continue to follow.  We will continuously monitor EEG and make changes to antiepileptic agents and sedation as needed.   This patient is neurologically critically ill due to focal status epilepticus.  The patient is worsening due to worsening seizures, cardiac and  respiratory failure, infection, complications of intubation and ICU stay and complications of sedation and seizure medications.  This patient's care requires constant monitoring of vital signs, hemodynamics, respiratory and cardiac monitoring, constantly reviewing EEG, review of multiple databases, neurological assessment, discussion with family, other specialists and medical decision making of high complexity.  I spent 30 minutes of neurocritical time in the care of this patient.      Georgiana Spinner Aroor [facility] Pager Number [phone] For questions after 7pm please refer to AMION to reach the Neurologist on call

## 2019-01-04 NOTE — Progress Notes (Signed)
Nutrition Follow-up  DOCUMENTATION CODES:   Not applicable  INTERVENTION:  Initiate TF with Vital High Protein at goal rate of 30 ml/h (720 ml per day) and Prostat 30 ml BID.  Tube feeding regimen with current propofol rate to provide 1295 kcals, 93 gm protein, 605 ml free water daily.  Provide liquid MVI daily per tube.   NUTRITION DIAGNOSIS:   Inadequate oral intake related to inability to eat(pt sedated and ventilated ) as evidenced by NPO status; ongoing  GOAL:   Patient will meet greater than or equal to 90% of their needs; progressing  MONITOR:   TF tolerance, Labs, Skin, Weight trends, I & O's  REASON FOR ASSESSMENT:   Ventilator    ASSESSMENT:   68 y.o. 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 Pt found to have PNA with sepsis. Pt now intubated s/p seizure.    Patient is currently intubated on ventilator support MV: 5.3 L/min Temp (24hrs), Avg:96.6 F (35.9 C), Min:93.2 F (34 C), Max:98.6 F (37 C)  Propofol: 14.2 ml/hr which provides 375 kcal/day. Plans to start weaning off propofol tomorrow.   RD consulted for enteral/tube feeding initiation and management. RD to put in orders. Per MD, pt with gram-positive bacteremia and aspiration pneumonia.    Labs and medications reviewed. Potassium low at 2.9. Magnesium low at 1.5.   Diet Order:   Diet Order            Diet NPO time specified  Diet effective now              EDUCATION NEEDS:   No education needs have been identified at this time  Skin:  Skin Assessment: Skin Integrity Issues: Skin Integrity Issues:: Other (Comment) Other: pressure injury sacrum  Last BM:  PTA  Height:   Ht Readings from Last 1 Encounters:  01/02/19 5' (1.524 m)    Weight:   Wt Readings from Last 1 Encounters:  01/02/19 59 kg  Net I/O: + 9.5 L  since admit  Ideal Body Weight:  45.4 kg  BMI:  Body mass index is 25.39 kg/m.  Estimated Nutritional Needs:   Kcal:  1150  Protein:  80-95 grams  Fluid:  >/= 1.5 L/day    Roslyn Smiling, MS, RD, LDN Pager # [phone] After hours/ weekend pager # [phone]

## 2019-01-04 NOTE — Procedures (Signed)
LTM-EEG Report  HISTORY: Continuous video-EEG monitoring performed for 68 year old with AMS, L hemiparesis.  ACQUISITION: International 10-20 system for electrode placement; 18 channels with additional eyes linked to ipsilateral ears and EKG. Additional T1-T2 electrodes were used. Continuous video recording obtained.   EEG NUMBER:  MEDICATIONS:  Day 2: see EMR  DAY #2: from 0730 01/03/19 to 0730 01/04/19 BACKGROUND: This was a burst-suppression record with periods of low voltage suppression lasting 4-15 seconds. Bursts of irregular 4-7Hz  activity lasted 1-3 seconds and were unchanged throughout the record. No clear reactivity was observed.  EPILEPTIFORM/PERIODIC ACTIVITY: none SEIZURES: none EVENTS: none reported  EKG: no significant arrhythmia  SUMMARY: This was an abnormal continuous EEG due to a burst-suppression pattern, iatrogenic in etiology. No further ictal or periodic activity was observed.

## 2019-01-04 NOTE — Progress Notes (Signed)
Foley to remain in place per CCM for strict I & O and burst suppression. Order in place.

## 2019-01-04 NOTE — Progress Notes (Signed)
PT Cancellation Note  Patient Details Name: Erika Wang MRN: [id number] DOB: January 31, 1951   Cancelled Treatment:    Reason Eval/Treat Not Completed: Medical issues which prohibited therapy. Pt remains intubated. PT will continue to f/u acutely as available and appropriate.    Alessandra Bevels Stanford Strauch 01/04/2019, 7:42 AM

## 2019-01-04 NOTE — Progress Notes (Signed)
LTM maint complete 

## 2019-01-04 NOTE — Progress Notes (Signed)
SLP Cancellation Note  Patient Details Name: Erika Wang MRN: [id number] DOB: [birth year]   Cancelled treatment:       Reason Eval/Treat Not Completed: Patient not medically ready. Per chart, remains intubated. Will follow.   Virl Axe Tore Carreker 01/04/2019, 7:32 AM  Ivar Drape, M.A. CCC-SLP Acute Herbalist [phone] Office [phone]

## 2019-01-04 NOTE — Progress Notes (Signed)
CRITICAL VALUE ALERT  Critical Value:   0847 CBG 67   Date & Time Notied:  3/07 0850  Provider Notified: Everardo All  Orders Received/Actions taken: 25 ml D50 given CBG 129 at [id number]

## 2019-01-05 LAB — CBC
HEMATOCRIT: 29.9 % — AB (ref 36.0–46.0)
HEMOGLOBIN: 9 g/dL — AB (ref 12.0–15.0)
MCH: 29.7 pg (ref 26.0–34.0)
MCHC: 30.1 g/dL (ref 30.0–36.0)
MCV: 98.7 fL (ref 80.0–100.0)
Platelets: 190 10*3/uL (ref 150–400)
RBC: 3.03 MIL/uL — ABNORMAL LOW (ref 3.87–5.11)
RDW: 15.7 % — ABNORMAL HIGH (ref 11.5–15.5)
WBC: 4.1 10*3/uL (ref 4.0–10.5)
nRBC: 0 % (ref 0.0–0.2)

## 2019-01-05 LAB — MAGNESIUM: Magnesium: 2.2 mg/dL (ref 1.7–2.4)

## 2019-01-05 LAB — CULTURE, BLOOD (ROUTINE X 2)
SPECIAL REQUESTS: ADEQUATE
Special Requests: ADEQUATE

## 2019-01-05 LAB — BASIC METABOLIC PANEL
Anion gap: 4 — ABNORMAL LOW (ref 5–15)
BUN: 9 mg/dL (ref 8–23)
CO2: 19 mmol/L — AB (ref 22–32)
Calcium: 7.3 mg/dL — ABNORMAL LOW (ref 8.9–10.3)
Chloride: 126 mmol/L — ABNORMAL HIGH (ref 98–111)
Creatinine, Ser: 0.75 mg/dL (ref 0.44–1.00)
GFR calc Af Amer: >60 mL/min (ref >60)
GFR calc non Af Amer: >60 mL/min (ref >60)
Glucose, Bld: 113 mg/dL — ABNORMAL HIGH (ref 70–99)
Potassium: 4.1 mmol/L (ref 3.5–5.1)
Sodium: 149 mmol/L — ABNORMAL HIGH (ref 135–145)

## 2019-01-05 LAB — GLUCOSE, CAPILLARY
GLUCOSE-CAPILLARY: 112 mg/dL — AB (ref 70–99)
Glucose-Capillary: 109 mg/dL — ABNORMAL HIGH (ref 70–99)
Glucose-Capillary: 109 mg/dL — ABNORMAL HIGH (ref 70–99)
Glucose-Capillary: 113 mg/dL — ABNORMAL HIGH (ref 70–99)
Glucose-Capillary: 98 mg/dL (ref 70–99)
Glucose-Capillary: 103 mg/dL — ABNORMAL HIGH (ref 70–99)

## 2019-01-05 LAB — PHOSPHORUS
Phosphorus: 2.6 mg/dL (ref 2.5–4.6)
Phosphorus: 2.5 mg/dL (ref 2.5–4.6)

## 2019-01-05 MED ORDER — SENNA 8.6 MG PO TABS: 1.0000 | ORAL_TABLET | Freq: Every day | ORAL | Status: DC | PRN

## 2019-01-05 MED ORDER — PRO-STAT SUGAR FREE PO LIQD: 30.0000 mL | Freq: Two times a day (BID) | ORAL | Status: DC

## 2019-01-05 MED ORDER — VITAL HIGH PROTEIN PO LIQD: 1000.0000 mL | ORAL | Status: DC

## 2019-01-05 NOTE — Procedures (Signed)
LTM-EEG Report  HISTORY: Continuous video-EEG monitoring performed for36year old withAMS, L hemiparesis. ACQUISITION: International 10-20 system for electrode placement; 18 channels with additional eyes linked to ipsilateral ears and EKG. Additional T1-T2 electrodes were used. Continuous video recording obtained.   EEG NUMBER:  MEDICATIONS:  Day 3:see EMR  DAY #3:from 0730 3/7/20to 0730 01/05/19 BACKGROUND: This was a burst-suppression record with periods of low voltage suppression lasting 8-12 seconds. Bursts of irregular 4-7Hz  activity lasted 1-2 seconds and were unchanged throughout the record. No clear reactivity was observed.  EPILEPTIFORM/PERIODIC ACTIVITY:none SEIZURES:none EVENTS:none reported  EKG: no significant arrhythmia  SUMMARY: This wasan abnormal continuous EEG due to a burst-suppression pattern, iatrogenic in etiology. No further ictal or periodic activity was observed.

## 2019-01-05 NOTE — Progress Notes (Signed)
maint complete.  

## 2019-01-05 NOTE — Progress Notes (Signed)
Reason for consult: Status epilepticus  Subjective: She remains intubated on sedation.   ROS:  Unable to obtain due to poor mental status  Examination  Vital signs in last 24 hours: Temp:  [96.4 F (35.8 C)-98.8 F (37.1 C)] 98.8 F (37.1 C) (03/08 2314) Pulse Rate:  [72-85] 85 (03/08 2314) Resp:  [15-18] 18 (03/08 2314) BP: (95-118)/(58-73) 118/65 (03/08 2230) SpO2:  [98 %-100 %] 98 % (03/08 2314) FiO2 (%):  [30 %] 30 % (03/08 2314) Weight:  [68.4 kg] 68.4 kg (03/08 0600)  General: lying in bed** CVS: pulse-normal rate and rhythm RS: breathing comfortably Extremities: normal   Neuro: Mental Status: Patient does not respond to verbal stimuli.  Does not respond to deep sternal rub.  Does not follow commands.  No verbalizations noted.  Cranial Nerves: II: patient does not respond confrontation bilaterally, pupils not reactive III,IV,VI: doll's response absent bilaterally.  V,VII: corneal reflex: absent VIII: patient does not respond to verbal stimuli IX,X: gag reflex absent  XI: trapezius strength unable to test bilaterally XII: tongue strength unable to test Motor: Extremities flaccid throughout.  No spontaneous movement noted.  No purposeful movements noted.  Sensory: Does not respond to noxious stimuli in any extremity. Plantars: mute Cerebellar: Unable to perform  Basic Metabolic Panel: Recent Labs  Lab 01/02/19 1127 01/02/19 1526 01/03/19 0051 01/03/19 0239 01/04/19 0926 01/04/19 1342 01/04/19 1657 01/05/19 0503 01/05/19 1637  NA 137  --  141 140 143  --   --  149*  --   K 4.2  --  3.6 3.5 2.9*  --   --  4.1  --   CL 98  --   --  109 118*  --   --  126*  --   CO2 25  --   --  23 18*  --   --  19*  --   GLUCOSE 118*  --   --  139* 138*  --   --  113*  --   BUN 20  --   --  14 8  --   --  9  --   CREATININE 1.01*  --   --  0.83 0.83  --   --  0.75  --   CALCIUM 10.0  --   --  8.2* 7.5*  --   --  7.3*  --   MG  --  2.0  --   --  1.5*  --   --  2.2  --    PHOS  --  2.5  --   --   --  2.4* 2.4* 2.6 2.5    CBC: Recent Labs  Lab 01/02/19 1127 01/03/19 0051 01/03/19 0239 01/04/19 0926 01/05/19 0503  WBC 11.3*  --  12.6* 5.8 4.1  NEUTROABS 8.5*  --   --   --   --   HGB 14.9 10.5* 10.7* 8.8* 9.0*  HCT 46.6* 31.0* 33.4* 28.3* 29.9*  MCV 92.5  --  93.0 97.3 98.7  PLT 247  --  194 174 190     Coagulation Studies: No results for input(s): LABPROT, INR in the last 72 hours.   DAY #3:[id number]/7/20to 0730 01/05/19 BACKGROUND:This was a burst-suppression record with periods of low voltage suppression lasting 8-12 seconds. Bursts of irregular 4-7Hz  activity lasted 1-2 seconds and were unchanged throughout the record. No clear reactivity was observed. EPILEPTIFORM/PERIODIC ACTIVITY:none SEIZURES:none EVENTS:none reported  EKG: no significant arrhythmia  SUMMARY: This wasan abnormal continuous EEG due toa burst-suppression pattern,  iatrogenic in etiology. No further ictal or periodic activity was observed.    ASSESSMENT AND PLAN  83 yFemale with past medical history of stroke in 2014, hypertension hyperlipidemia presents to the emergency room after being founddown.Last seen normal was 2 days ago by her family. On arrival to emergency department patient had right gaze deviation and left hemiparesis and neurology was consulted.Immediate CT head was obtained which showed no large acute right MCA stroke-stat CT angiogram negative for LVO and CT perfusion negative for any perfusion deficit. Due to concern for patient may be in status,stat EEG was obtained which showed right hemispheric seizures.  Patient was loaded with Keppra and then Fosphenytoin, however continued to seize and intubated for airway protection and burst suppression. Vimpat also added.  Patient has remained in burst suppression since 3/6.   Has been in burst suppression for 48 hours.  We will continue to wean off per suppression    Status  epilepticus-currently burst suppressed   Recommendations Continue long-term EEG Wean versed today, continue propofol  Continue Vimpat Keppra and Dilantin, last Dilantin level 14.8 Avoid cephalosporins if possible for treatment of sepsis Continue serial neurochecks  Reviewed EEG on 2 occasions today, burst suppression.   This patient is neurologically critically ill due to focal status epilepticus.  The patient is worsening due to worsening seizures, cardiac and respiratory failure, infection, complications of intubation and ICU stay and complications of sedation and seizure medications.  This patient's care requires constant monitoring of vital signs, hemodynamics, respiratory and cardiac monitoring, constantly reviewing EEG, review of multiple databases, neurological assessment, discussion with family, other specialists and medical decision making of high complexity.  I spent30 minutes of neurocritical time in the care of this patient.     Georgiana Spinner Aroor [facility] Pager Number [phone] For questions after 7pm please refer to AMION to reach the Neurologist on call

## 2019-01-05 NOTE — Progress Notes (Signed)
SLP Cancellation Note  Patient Details Name: Akayla Braniff MRN: [id number] DOB: 1951-03-18   Cancelled treatment:       Reason Eval/Treat Not Completed: Patient not medically ready. Pt remains intubated at this time. SLP will follow up.   Uno Esau I. Vear Clock, MS, CCC-SLP Acute Rehabilitation Services Office number [phone] Pager [phone]  Scheryl Marten 01/05/2019, 8:18 AM

## 2019-01-05 NOTE — Progress Notes (Signed)
OT Cancellation Note  Patient Details Name: Erika Wang MRN: [id number] DOB: June 22, 1951   Cancelled Treatment:    Reason Eval/Treat Not Completed: Patient not medically ready.  Pt intubated and sedated.  Will check back,  Jeani Hawking, OTR/L Acute Rehabilitation Services Pager [phone] Office [phone]   Jeani Hawking M 01/05/2019, 3:33 PM

## 2019-01-05 NOTE — Progress Notes (Signed)
NAMEEmberlee Wang, MRN:  [id number], DOB:  07-Jul-1951, LOS: 3 ADMISSION DATE:  01/02/2019, CONSULTATION DATE:  3/5 REFERRING MD:  Dr. Jerral Ralph, CHIEF COMPLAINT:  Status epilepticus   Brief History   68 year old female admitted with AMS found to have status epilepticus refractory to the AEDs and required transfer to ICU for intubation and continuous infusion.   History of present illness   Patient is encephalopathic and/or intubated. Therefore history has been obtained from chart review. 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 She was last seen well by family 3/3 and had no complaints, but seemed to be a bit off balance. Then 3/5 she was found down with R gaze and was unresponsive. Initial imaging was unremarkable with the exception of suspected aspiration PNA RUL. EEG, however, demonstrated seizure. She was treated with Keppra and CAP coverage and admitted for continuous EEG. Despite escalating seizure medication she remained unresponsive with seizure on EEG. PCCM was consulted to transfer to ICU and possible intubation.   Past Medical History   has a past medical history of High cholesterol, Hypertension, and Stroke (HCC).   Significant Hospital Events   3/5 admit to floor, then ICU transfer.  3/6 intubated and on burst suppression per Neurology  Consults:  Neurology  Procedures:  ETT 3/6>>  Significant Diagnostic Tests:  CT head 3/5 > small vessel disease. No acute cortically based infarct or hemorrhage.  CT angio and perfusion 3/5 > negative large vessel occlusion. No core infarct or penumbra. Moderate to severe ICA stenosis. RUL pneumonia partially visible.  CT C-spine 3/5 > No cervical spine fracture or subluxation. Mild multilevel degenerative changes. EEG 3/5 >Continuous epileptiform discharge involving the right temporal region meeting the electrographic criteria for for status epilepticus CXR  01/04/19 > Patchy airspace disease on right side in RUL and RML, ETT appropriate position, interval placement of L IJ CVC  Micro Data:  Blood 3/5 >  Blood 3/6 > Tracheal aspirate  3/6>  Antimicrobials:  Ceftriaxone 3/5  Flagyl 3/5  Unasyn 3/5 >>>  Interim history/subjective:  Afebrile. Remains on minimal vent settings. UOP improved in the last 24 hours with 2L out. Overnight RN reports no recent BMs.  Objective   Blood pressure 102/66, pulse 76, temperature 97.9 F (36.6 C), resp. rate 15, height 5' (1.524 m), weight 68.4 kg, SpO2 98 %.    Vent Mode: PRVC FiO2 (%):  [30 %] 30 % Set Rate:  [15 bmp] 15 bmp Vt Set:  [360 mL] 360 mL PEEP:  [5 cmH20] 5 cmH20 Plateau Pressure:  [13 cmH20-15 cmH20] 15 cmH20   Intake/Output Summary (Last 24 hours) at 01/05/2019 2229 Last data filed at 01/05/2019 0600 Gross per 24 hour  Intake 4642.34 ml  Output 2025 ml  Net 2617.34 ml   Filed Weights   01/02/19 1715 01/05/19 0600  Weight: 59 kg 68.4 kg   Physical Exam: General: Thin female laying in bed, sedated, on mechanical ventilation HENT: [place], AT, ETT in place, EEG leads in place Eyes: EOMI, no scleral icterus Respiratory: Clear to auscultation bilaterally.  No crackles, wheezing or rales Cardiovascular: RRR, -M/R/G, no JVD GI: BS+, soft, nontender Extremities:-Edema,-tenderness Neuro: Sedated Skin: Intact, no rashes or bruising GU: Foley in place   Resolved Hospital Problem list   Hypokalemia  Assessment & Plan:   Status epilepticus - AED management per neurology: Vimpat, Keppra, Dilantin - On continuous sedation for  burst suppression per Neurology - Serial neurochecks - Continuous EEG  Acute hypoxemic respiratory insufficiency due to inability to protect airway On minimal vent settings. Remain intubated due to SE and sedation Plan: - Full vent support. No plan for wean at this time. - Sedation for RASS goal 0 to -1, but will defer goal/dosing to neurology if required for  burst suppression - VAP bundle - Daily SBT once sedation can be lightened  Sepsis shock secondary to S.Epidermidis bacteremia and suspected aspiration pneumonia Shock improved. Only on minimal pressor support that is likely related to sedation. TTE with normal EF, no vegetations. BCX x 2 grew S. Epidermidis. Last negative BCx on 3/6. Plan: - Continue minimal pressor support to maintain MAPs >65 - Remain on Unasyn. Will de-escalate to PO when extubated. Plan for two week treatment from last negative culture date - F/u final BCX 3/6 for clearance.  Possible CVA - Neurology following  Hypertension Hyperlipidemia - Will hold home diltiazem, atorvastatin until taking PO  Non-anion gap acidosis Poor UOP - improved in last 24 hours Likely iatrogenic with recent fluid administration - F/u BMP, monitor UOP - Maintain foley  Best practice:  Diet: TF Pain/Anxiety/Delirium protocol (if indicated): Propofol and Versed per PAD protocol VAP protocol (if indicated): Yes DVT prophylaxis: enoxaparin  GI prophylaxis: N/a  Glucose control: N/a Mobility: BR Code Status: FULL Family Communication: No family at bedside Disposition: ICU  Labs   CBC: Recent Labs  Lab 01/02/19 1127 01/03/19 0051 01/03/19 0239 01/04/19 0926 01/05/19 0503  WBC 11.3*  --  12.6* 5.8 4.1  NEUTROABS 8.5*  --   --   --   --   HGB 14.9 10.5* 10.7* 8.8* 9.0*  HCT 46.6* 31.0* 33.4* 28.3* 29.9*  MCV 92.5  --  93.0 97.3 98.7  PLT 247  --  194 174 190    Basic Metabolic Panel: Recent Labs  Lab 01/02/19 1127 01/02/19 1526 01/03/19 0051 01/03/19 0239 01/04/19 0926 01/04/19 1342 01/04/19 1657 01/05/19 0503  NA 137  --  141 140 143  --   --  149*  K 4.2  --  3.6 3.5 2.9*  --   --  4.1  CL 98  --   --  109 118*  --   --  126*  CO2 25  --   --  23 18*  --   --  19*  GLUCOSE 118*  --   --  139* 138*  --   --  113*  BUN 20  --   --  14 8  --   --  9  CREATININE 1.01*  --   --  0.83 0.83  --   --  0.75    CALCIUM 10.0  --   --  8.2* 7.5*  --   --  7.3*  MG  --  2.0  --   --  1.5*  --   --  2.2  PHOS  --  2.5  --   --   --  2.4* 2.4* 2.6   GFR: Estimated Creatinine Clearance: 60.5 mL/min (by C-G formula based on SCr of 0.75 mg/dL). Recent Labs  Lab 01/02/19 1127 01/02/19 1358 01/03/19 0102 01/03/19 0239 01/03/19 1259 01/03/19 1650 01/04/19 0426 01/04/19 0926 01/05/19 0503  PROCALCITON  --   --  0.18 0.17  --   --  0.13  --   --   WBC 11.3*  --   --  12.6*  --   --   --  5.8 4.1  LATICACIDVEN  --  1.6  --   --  1.7 1.3  --   --   --     Liver Function Tests: Recent Labs  Lab 01/02/19 1127  AST 34  ALT 16  ALKPHOS 101  BILITOT 0.7  PROT 8.1  ALBUMIN 3.2*   No results for input(s): LIPASE, AMYLASE in the last 168 hours. Recent Labs  Lab 01/02/19 1526  AMMONIA 15    ABG    Component Value Date/Time   PHART 7.380 01/03/2019 0051   PCO2ART 44.8 01/03/2019 0051   PO2ART 432.0 (H) 01/03/2019 0051   HCO3 26.6 01/03/2019 0051   TCO2 28 01/03/2019 0051   O2SAT 100.0 01/03/2019 0051     Coagulation Profile: No results for input(s): INR, PROTIME in the last 168 hours.  Cardiac Enzymes: Recent Labs  Lab 01/02/19 1127  CKTOTAL 534*    HbA1C: Hgb A1c MFr Bld  Date/Time Value Ref Range Status  01/03/2019 02:39 AM 5.9 (H) 4.8 - 5.6 % Final    Comment:    (NOTE) Pre diabetes:          5.7%-6.4% Diabetes:              >6.4% Glycemic control for   <7.0% adults with diabetes   09/29/2013 04:15 AM 5.9 (H) <5.7 % Final    Comment:    (NOTE)                                                                       According to the ADA Clinical Practice Recommendations for 2011, when HbA1c is used as a screening test:  >=6.5%   Diagnostic of Diabetes Mellitus           (if abnormal result is confirmed) 5.7-6.4%   Increased risk of developing Diabetes Mellitus References:Diagnosis and Classification of Diabetes Mellitus,Diabetes Care,2011,34(Suppl 1):S62-S69 and  Standards of Medical Care in         Diabetes - 2011,Diabetes Care,2011,34 (Suppl 1):S11-S61.    CBG: Recent Labs  Lab 01/04/19 1212 01/04/19 1616 01/04/19 1957 01/04/19 2335 01/05/19 0400  GLUCAP 83 104* 122* 105* 109*    Critical care time: 35 min  The patient is critically ill with multiple organ systems failure and requires high complexity decision making for assessment and support, frequent evaluation and titration of therapies, application of advanced monitoring technologies and extensive interpretation of multiple databases.   Critical Care Time devoted to patient care services described in this note is 35 Minutes. This time reflects time of care of this signee Dr. Mechele Collin. This critical care time does not reflect procedure time, or teaching time or supervisory time of PA/NP/Med student/Med Resident etc but could involve care discussion time.  Mechele Collin, M.D. [facility] Pulmonary/Critical Care Medicine Pager: [phone] After hours pager: [phone]

## 2019-01-06 DIAGNOSIS — R569 Unspecified convulsions: Secondary | ICD-10-CM

## 2019-01-06 LAB — GLUCOSE, CAPILLARY
GLUCOSE-CAPILLARY: 108 mg/dL — AB (ref 70–99)
Glucose-Capillary: 104 mg/dL — ABNORMAL HIGH (ref 70–99)
Glucose-Capillary: 107 mg/dL — ABNORMAL HIGH (ref 70–99)
Glucose-Capillary: 121 mg/dL — ABNORMAL HIGH (ref 70–99)
Glucose-Capillary: 91 mg/dL (ref 70–99)
Glucose-Capillary: 95 mg/dL (ref 70–99)

## 2019-01-06 LAB — BASIC METABOLIC PANEL
Anion gap: 3 — ABNORMAL LOW (ref 5–15)
BUN: 11 mg/dL (ref 8–23)
CHLORIDE: 124 mmol/L — AB (ref 98–111)
CO2: 20 mmol/L — ABNORMAL LOW (ref 22–32)
Calcium: 7.5 mg/dL — ABNORMAL LOW (ref 8.9–10.3)
Creatinine, Ser: 0.8 mg/dL (ref 0.44–1.00)
GFR calc Af Amer: >60 mL/min (ref >60)
GFR calc non Af Amer: >60 mL/min (ref >60)
Glucose, Bld: 121 mg/dL — ABNORMAL HIGH (ref 70–99)
Potassium: 3.7 mmol/L (ref 3.5–5.1)
Sodium: 147 mmol/L — ABNORMAL HIGH (ref 135–145)

## 2019-01-06 LAB — CBC
HCT: 30.1 % — ABNORMAL LOW (ref 36.0–46.0)
Hemoglobin: 9.1 g/dL — ABNORMAL LOW (ref 12.0–15.0)
MCH: 29.9 pg (ref 26.0–34.0)
MCHC: 30.2 g/dL (ref 30.0–36.0)
MCV: 99 fL (ref 80.0–100.0)
Platelets: 197 10*3/uL (ref 150–400)
RBC: 3.04 MIL/uL — ABNORMAL LOW (ref 3.87–5.11)
RDW: 16.2 % — ABNORMAL HIGH (ref 11.5–15.5)
WBC: 5.2 10*3/uL (ref 4.0–10.5)
nRBC: 0 % (ref 0.0–0.2)

## 2019-01-06 LAB — CULTURE, RESPIRATORY

## 2019-01-06 LAB — CULTURE, RESPIRATORY W GRAM STAIN: Culture: NORMAL

## 2019-01-06 LAB — MAGNESIUM: Magnesium: 1.9 mg/dL (ref 1.7–2.4)

## 2019-01-06 LAB — TRIGLYCERIDES: Triglycerides: 44 mg/dL (ref <150)

## 2019-01-06 MED ORDER — SODIUM CHLORIDE 0.9 % IV SOLN
3.0000 g | Freq: Three times a day (TID) | INTRAVENOUS | Status: AC
  Administered 2019-01-06 – 2019-01-08 (×5): 3 g via INTRAVENOUS
  Filled 2019-01-06 (×5): qty 3

## 2019-01-06 MED ORDER — PANTOPRAZOLE SODIUM 40 MG PO PACK
40.0000 mg | PACK | Freq: Every day | ORAL | Status: DC
  Administered 2019-01-06 – 2019-01-26 (×21): 40 mg
  Filled 2019-01-06 (×21): qty 20

## 2019-01-06 MED ORDER — PROPOFOL 1000 MG/100ML IV EMUL
10.0000 ug/kg/min | INTRAVENOUS | Status: DC
  Administered 2019-01-06: 20 ug/kg/min via INTRAVENOUS

## 2019-01-06 NOTE — Progress Notes (Addendum)
Subjective: No jerking or twitching per nursing staff  Objective: Current vital signs: BP 117/71   Pulse 86   Temp 98.6 F (37 C)   Resp 18   Ht 5' (1.524 m)   Wt 69.4 kg   SpO2 97%   BMI 29.88 kg/m  Vital signs in last 24 hours: Temp:  [96.4 F (35.8 C)-98.8 F (37.1 C)] 98.6 F (37 C) (03/09 1000) Pulse Rate:  [72-86] 86 (03/09 1000) Resp:  [15-23] 18 (03/09 1000) BP: (102-144)/(60-88) 117/71 (03/09 1000) SpO2:  [96 %-100 %] 97 % (03/09 1000) FiO2 (%):  [30 %] 30 % (03/09 0802) Weight:  [69.4 kg] 69.4 kg (03/09 0500)  Intake/Output from previous day: 03/08 0701 - 03/09 0700 In: 4441.2 [I.V.:3391.4; NG/GT:660; IV Piggyback:389.8] Out: 2020 [Urine:2020] Intake/Output this shift: Total I/O In: 357.3 [I.V.:312.3; IV Piggyback:45] Out: 580 [Urine:580] Nutritional status:  Diet Order            Diet NPO time specified  Diet effective now              Neurologic Exam: Ment: No responses to any external stimuli. No spontaneous eye opening or limb movement. Does not respond to voice.  CN: Pupils unreactive. No blink to threat. Face flaccidly symmetric. Motor/Sensory: Flaccid tone x 4 with no movement to noxious stimuli Reflexes: 2+ bilateral brachioradialis and biceps. Hypoactive patellar reflexes. Toes mute  Lab Results: Results for orders placed or performed during the hospital encounter of 01/02/19 (from the past 48 hour(s))  Glucose, capillary     Status: None   Collection Time: 01/04/19 12:12 PM  Result Value Ref Range   Glucose-Capillary 83 70 - 99 mg/dL   Comment 1 Notify RN    Comment 2 Document in Chart   Phosphorus     Status: Abnormal   Collection Time: 01/04/19  1:42 PM  Result Value Ref Range   Phosphorus 2.4 (L) 2.5 - 4.6 mg/dL    Comment: Performed at [facility] [address], [place], [place] [address]  Phenytoin level, total     Status: None   Collection Time: 01/04/19  2:00 PM  Result Value Ref Range   Phenytoin Lvl 14.8 10.0 -  20.0 ug/mL    Comment: Performed at [facility] [address], [place], [place] [address]  Glucose, capillary     Status: Abnormal   Collection Time: 01/04/19  4:16 PM  Result Value Ref Range   Glucose-Capillary 104 (H) 70 - 99 mg/dL  Phosphorus     Status: Abnormal   Collection Time: 01/04/19  4:57 PM  Result Value Ref Range   Phosphorus 2.4 (L) 2.5 - 4.6 mg/dL    Comment: Performed at [facility] [address], [place], [place] [address]  Glucose, capillary     Status: Abnormal   Collection Time: 01/04/19  7:57 PM  Result Value Ref Range   Glucose-Capillary 122 (H) 70 - 99 mg/dL  Glucose, capillary     Status: Abnormal   Collection Time: 01/04/19 11:35 PM  Result Value Ref Range   Glucose-Capillary 105 (H) 70 - 99 mg/dL  Glucose, capillary     Status: Abnormal   Collection Time: 01/05/19  4:00 AM  Result Value Ref Range   Glucose-Capillary 109 (H) 70 - 99 mg/dL  CBC     Status: Abnormal   Collection Time: 01/05/19  5:03 AM  Result Value Ref Range   WBC 4.1 4.0 - 10.5 K/uL  RBC 3.03 (L) 3.87 - 5.11 MIL/uL   Hemoglobin 9.0 (L) 12.0 - 15.0 g/dL   HCT 29.9 (L) 36.0 - 46.0 %   MCV 98.7 80.0 - 100.0 fL   MCH 29.7 26.0 - 34.0 pg   MCHC 30.1 30.0 - 36.0 g/dL   RDW 15.7 (H) 11.5 - 15.5 %   Platelets 190 150 - 400 K/uL   nRBC 0.0 0.0 - 0.2 %    Comment: Performed at [facility] [address] [place], [place] [address]  Basic metabolic panel     Status: Abnormal   Collection Time: 01/05/19  5:03 AM  Result Value Ref Range   Sodium 149 (H) 135 - 145 mmol/L   Potassium 4.1 3.5 - 5.1 mmol/L    Comment: DELTA CHECK NOTED   Chloride 126 (H) 98 - 111 mmol/L   CO2 19 (L) 22 - 32 mmol/L   Glucose, Bld 113 (H) 70 - 99 mg/dL   BUN 9 8 - 23 mg/dL   Creatinine, Ser 0.75 0.44 - 1.00 mg/dL   Calcium 7.3 (L) 8.9 - 10.3 mg/dL   GFR calc non Af Amer >60 >60 mL/min   GFR calc Af Amer >60 >60 mL/min   Anion gap 4 (L) 5 - 15    Comment: Performed at [facility], [place] [address], [place], [place] [address]  Magnesium     Status: None   Collection Time: 01/05/19  5:03 AM  Result Value Ref Range   Magnesium 2.2 1.7 - 2.4 mg/dL    Comment: Performed at [facility] [address], [place], [place] [address]  Phosphorus     Status: None   Collection Time: 01/05/19  5:03 AM  Result Value Ref Range   Phosphorus 2.6 2.5 - 4.6 mg/dL    Comment: Performed at [facility] [address] [place], [place] [address]  Glucose, capillary     Status: Abnormal   Collection Time: 01/05/19  8:10 AM  Result Value Ref Range   Glucose-Capillary 112 (H) 70 - 99 mg/dL   Comment 1 Notify RN    Comment 2 Document in Chart   Glucose, capillary     Status: Abnormal   Collection Time: 01/05/19 11:33 AM  Result Value Ref Range   Glucose-Capillary 109 (H) 70 - 99 mg/dL   Comment 1 Notify RN    Comment 2 Document in Chart   Glucose, capillary     Status: Abnormal   Collection Time: 01/05/19  3:55 PM  Result Value Ref Range   Glucose-Capillary 113 (H) 70 - 99 mg/dL   Comment 1 Notify RN    Comment 2 Document in Chart   Phosphorus     Status: None   Collection Time: 01/05/19  4:37 PM  Result Value Ref Range   Phosphorus 2.5 2.5 - 4.6 mg/dL    Comment: Performed at [facility], [place] [address] [place], [place] [address]  Glucose, capillary     Status: None   Collection Time: 01/05/19  7:29 PM  Result Value Ref Range   Glucose-Capillary 98 70 - 99 mg/dL  Glucose, capillary     Status: Abnormal   Collection Time: 01/05/19 11:20 PM  Result Value Ref Range   Glucose-Capillary 103 (H) 70 - 99 mg/dL  Glucose, capillary     Status: Abnormal   Collection Time: 01/06/19  4:56 AM  Result Value Ref Range   Glucose-Capillary 108 (H)  70 - 99 mg/dL  CBC     Status: Abnormal   Collection Time: 01/06/19  5:00 AM  Result Value Ref Range   WBC 5.2 4.0 - 10.5 K/uL   RBC 3.04 (L) 3.87 - 5.11 MIL/uL   Hemoglobin 9.1 (L) 12.0 - 15.0 g/dL   HCT 30.1  (L) 36.0 - 46.0 %   MCV 99.0 80.0 - 100.0 fL   MCH 29.9 26.0 - 34.0 pg   MCHC 30.2 30.0 - 36.0 g/dL   RDW 16.2 (H) 11.5 - 15.5 %   Platelets 197 150 - 400 K/uL   nRBC 0.0 0.0 - 0.2 %    Comment: Performed at [facility] [address] [place], [place] [address]  Basic metabolic panel     Status: Abnormal   Collection Time: 01/06/19  5:00 AM  Result Value Ref Range   Sodium 147 (H) 135 - 145 mmol/L   Potassium 3.7 3.5 - 5.1 mmol/L   Chloride 124 (H) 98 - 111 mmol/L   CO2 20 (L) 22 - 32 mmol/L   Glucose, Bld 121 (H) 70 - 99 mg/dL   BUN 11 8 - 23 mg/dL   Creatinine, Ser 0.80 0.44 - 1.00 mg/dL   Calcium 7.5 (L) 8.9 - 10.3 mg/dL   GFR calc non Af Amer >60 >60 mL/min   GFR calc Af Amer >60 >60 mL/min   Anion gap 3 (L) 5 - 15    Comment: Performed at [facility], [place] [address], [place], [place] [address]  Magnesium     Status: None   Collection Time: 01/06/19  5:00 AM  Result Value Ref Range   Magnesium 1.9 1.7 - 2.4 mg/dL    Comment: Performed at [facility], [place] [address], [place], [place] [address]  Triglycerides     Status: None   Collection Time: 01/06/19  6:03 AM  Result Value Ref Range   Triglycerides 44 <150 mg/dL    Comment: Performed at [facility] [address] [place], [place] [address]  Glucose, capillary     Status: None   Collection Time: 01/06/19  8:13 AM  Result Value Ref Range   Glucose-Capillary 95 70 - 99 mg/dL   Comment 1 Notify RN    Comment 2 Document in Chart     Recent Results (from the past 240 hour(s))  Blood Culture (routine x 2)     Status: Abnormal   Collection Time: 01/02/19 11:58 AM  Result Value Ref Range Status   Specimen Description BLOOD LEFT ANTECUBITAL  Final   Special Requests   Final    BOTTLES DRAWN AEROBIC AND ANAEROBIC Blood Culture adequate volume   Culture  Setup Time   Final    GRAM POSITIVE COCCI IN CLUSTERS IN BOTH AEROBIC AND ANAEROBIC BOTTLES CRITICAL RESULT CALLED TO, READ BACK BY AND  VERIFIED WITH: [facility] RN AT 0715 ON [id number] BY SJW Performed at [facility], [place] [address], [place], [place] [address]    Culture STAPHYLOCOCCUS EPIDERMIDIS (A)  Final   Report Status 01/05/2019 FINAL  Final   Organism ID, Bacteria STAPHYLOCOCCUS EPIDERMIDIS  Final      Susceptibility   Staphylococcus epidermidis - MIC*    CIPROFLOXACIN <=0.5 SENSITIVE Sensitive     ERYTHROMYCIN <=0.25 SENSITIVE Sensitive     GENTAMICIN <=0.5 SENSITIVE Sensitive     OXACILLIN <=0.25 SENSITIVE Sensitive     TETRACYCLINE >=16 RESISTANT Resistant  VANCOMYCIN 1 SENSITIVE Sensitive     TRIMETH/SULFA <=10 SENSITIVE Sensitive     CLINDAMYCIN <=0.25 SENSITIVE Sensitive     RIFAMPIN <=0.5 SENSITIVE Sensitive     Inducible Clindamycin NEGATIVE Sensitive     * STAPHYLOCOCCUS EPIDERMIDIS  Blood Culture ID Panel (Reflexed)     Status: Abnormal   Collection Time: 01/02/19 11:58 AM  Result Value Ref Range Status   Enterococcus species NOT DETECTED NOT DETECTED Final   Listeria monocytogenes NOT DETECTED NOT DETECTED Final   Staphylococcus species DETECTED (A) NOT DETECTED Final    Comment: Methicillin (oxacillin) susceptible coagulase negative staphylococcus. Possible blood culture contaminant (unless isolated from more than one blood culture draw or clinical case suggests pathogenicity). No antibiotic treatment is indicated for blood  culture contaminants. CRITICAL RESULT CALLED TO, READ BACK BY AND VERIFIED WITH: [facility] RN AT 0715 ON [id number] BY SJW    Staphylococcus aureus (BCID) NOT DETECTED NOT DETECTED Final   Methicillin resistance NOT DETECTED NOT DETECTED Final   Streptococcus species NOT DETECTED NOT DETECTED Final   Streptococcus agalactiae NOT DETECTED NOT DETECTED Final   Streptococcus pneumoniae NOT DETECTED NOT DETECTED Final   Streptococcus pyogenes NOT DETECTED NOT DETECTED Final   Acinetobacter baumannii NOT DETECTED NOT DETECTED Final   Enterobacteriaceae species NOT DETECTED NOT DETECTED  Final   Enterobacter cloacae complex NOT DETECTED NOT DETECTED Final   Escherichia coli NOT DETECTED NOT DETECTED Final   Klebsiella oxytoca NOT DETECTED NOT DETECTED Final   Klebsiella pneumoniae NOT DETECTED NOT DETECTED Final   Proteus species NOT DETECTED NOT DETECTED Final   Serratia marcescens NOT DETECTED NOT DETECTED Final   Haemophilus influenzae NOT DETECTED NOT DETECTED Final   Neisseria meningitidis NOT DETECTED NOT DETECTED Final   Pseudomonas aeruginosa NOT DETECTED NOT DETECTED Final   Candida albicans NOT DETECTED NOT DETECTED Final   Candida glabrata NOT DETECTED NOT DETECTED Final   Candida krusei NOT DETECTED NOT DETECTED Final   Candida parapsilosis NOT DETECTED NOT DETECTED Final   Candida tropicalis NOT DETECTED NOT DETECTED Final    Comment: Performed at [facility], [address] [place], [place] [address]  Blood Culture (routine x 2)     Status: Abnormal   Collection Time: 01/02/19 12:03 PM  Result Value Ref Range Status   Specimen Description BLOOD SITE NOT SPECIFIED  Final   Special Requests   Final    BOTTLES DRAWN AEROBIC AND ANAEROBIC Blood Culture adequate volume   Culture  Setup Time   Final    GRAM POSITIVE COCCI IN BOTH AEROBIC AND ANAEROBIC BOTTLES CRITICAL VALUE NOTED.  VALUE IS CONSISTENT WITH PREVIOUSLY REPORTED AND CALLED VALUE.    Culture (A)  Final    STAPHYLOCOCCUS EPIDERMIDIS SUSCEPTIBILITIES PERFORMED ON PREVIOUS CULTURE WITHIN THE LAST 5 DAYS. Performed at [facility], [place] [address] [place], [place] [address]    Report Status 01/05/2019 FINAL  Final  MRSA PCR Screening     Status: None   Collection Time: 01/03/19  1:38 AM  Result Value Ref Range Status   MRSA by PCR NEGATIVE NEGATIVE Final    Comment:        The GeneXpert MRSA Assay (FDA approved for NASAL specimens only), is one component of a comprehensive MRSA colonization surveillance program. It is not intended to diagnose MRSA infection nor to guide  or monitor treatment for MRSA infections. Performed at [facility], [place] [address] [place], [place] [address]   Culture, blood (  routine x 2)     Status: None (Preliminary result)   Collection Time: 01/03/19 10:15 AM  Result Value Ref Range Status   Specimen Description BLOOD LEFT HAND  Final   Special Requests AEROBIC BOTTLE ONLY Blood Culture adequate volume  Final   Culture   Final    NO GROWTH 3 DAYS Performed at [facility], [place] [address], [place], [place] [address]    Report Status PENDING  Incomplete  Culture, blood (routine x 2)     Status: None (Preliminary result)   Collection Time: 01/03/19 10:21 AM  Result Value Ref Range Status   Specimen Description BLOOD LEFT ANTECUBITAL  Final   Special Requests   Final    BOTTLES DRAWN AEROBIC AND ANAEROBIC Blood Culture adequate volume   Culture   Final    NO GROWTH 3 DAYS Performed at [facility], [place] [address], [place], [place] [address]    Report Status PENDING  Incomplete  Culture, respiratory (non-expectorated)     Status: None   Collection Time: 01/03/19  5:25 PM  Result Value Ref Range Status   Specimen Description TRACHEAL ASPIRATE  Final   Special Requests NONE  Final   Gram Stain   Final    ABUNDANT WBC PRESENT, PREDOMINANTLY MONONUCLEAR NO ORGANISMS SEEN    Culture   Final    RARE Consistent with normal respiratory flora. Performed at [facility], [place] [address], [place], [place] [address]    Report Status 01/06/2019 FINAL  Final    Lipid Panel Recent Labs    01/06/19 0603  TRIG 44    Studies/Results: No results found.  Medications:  Scheduled: . chlorhexidine gluconate (MEDLINE KIT)  15 mL Mouth Rinse BID  . enoxaparin (LOVENOX) injection  40 mg Subcutaneous Q24H  . feeding supplement (PRO-STAT SUGAR FREE 64)  30 mL Per Tube BID  . feeding supplement (VITAL HIGH PROTEIN)  1,000 mL Per Tube Q24H  . mouth rinse  15 mL Mouth Rinse 10 times per day  . multivitamin  15  mL Per Tube Daily  . pantoprazole sodium  40 mg Per Tube QHS  . phenytoin (DILANTIN) IV  100 mg Intravenous Q8H   Continuous: . sodium chloride 250 mL (01/06/19 1021)  . ampicillin-sulbactam (UNASYN) IV    . lacosamide (VIMPAT) IV Stopped (01/06/19 0953)  . levETIRAcetam Stopped (01/06/19 0408)    Assessment: 46 year oldfemale with past medical history of stroke in 2014 who presents to the emergency room after being founddown.Last seen normal was 2 days PTA by her family. On arrival to emergency department patient had right gaze deviation and left hemiparesis. CT head showed no large acute right MCA stroke-stat CT angiogram negative for LVO and CT perfusion negative for any perfusion deficit. STAT EEG showed right hemispheric seizures. Patient was loaded with Keppra and then fosphenytoin; however, she continued to seize and was intubated for airway protection and burst suppression. Vimpat was added.Was in burst suppression for 48 hours (starting on 3/6) prior to being fully weaned off propofol earlier this AM.  1. Status epilepticus - continues to be burst suppressed at time of AM Neurological assessment, 4 hours after discontinuation of propofol. DDx includes slow clearance of Versed and diffuse anoxic injury given that she was found down after last being seen normal 2 days previously. 2. On Vimpat 200 BID, Keppra 1500 BID and Dilantin 100 TID, with phenytoin level of 14.8 on 3/7 3. Bedside EEG reviiew: Burst  suppression without electrographic seizures. Awaiting official LTM report. Waning burst suppression per preliminary read by Dr. Doree Albee.   Recommendations 1. Continue long-term EEG 2. Versed weaned yesterday. Propofol wean completed at 0542 this morning.  3. Continue Vimpat, Keppra and Dilantin 4. Avoid cephalosporinsif possible for treatment of sepsis 5. Continue serial neurochecks   35 minutes spent in the neurological evaluation and management of this critically ill patient.  Time spent included bedside EEG review.    LOS: 4 days   '@Electronically'$  signed: Dr. Kerney Elbe 01/06/2019  11:04 AM

## 2019-01-06 NOTE — Progress Notes (Signed)
Wasted in narcotics waste container in med room of versed with Felipa Furnace RN.

## 2019-01-06 NOTE — Progress Notes (Signed)
Speech Language Pathology Discharge Patient Details Name: Erika Wang MRN: [id number] DOB: [birth year] Today's Date: 01/06/2019 Time:  -     Patient discharged from SLP services secondary to medical decline - will need to re-order SLP to resume therapy services. Pt remains intubated.  Please see latest therapy progress note for current level of functioning and progress toward goals.    Progress and discharge plan discussed with patient and/or caregiver:  GO     Royce Macadamia 01/06/2019, 8:21 AM   Breck Coons Lonell Face.Ed Nurse, children's [phone] Office [phone]

## 2019-01-06 NOTE — Progress Notes (Addendum)
68yo female admitted with status epilepticus with last known normal about 2 days prior to admission. She has been weaned off of all sedation since ~5am this morning.  On evaluation she is not responsive to voice or sternal rub. Pupils mildly reactive, generalized flaccid paralysis.  Video EEG monitoring reviewed with Dr. Otelia Limes - still reveals burst suppression. At this point sedation should be cleared and not affecting her exam. Anoxic brain injury is now significantly higher on the differential.  Recommendations:  1. Continue video EEG overnight 2. Continue Vimpat, keppra, and dilantin 3. If no improvement in exam in the morning, will recommend MRI w/o contrast after removal of EEG leads to assess for further evidence of anoxic brain injury.  Nyra Market, MD PGY3

## 2019-01-06 NOTE — Progress Notes (Signed)
OT Cancellation Note  Patient Details Name: Erika Wang MRN: [id number] DOB: [birth year]   Cancelled Treatment:    Reason Eval/Treat Not Completed: Patient not medically ready.  Pt intubated, and non responsive.  OT will sign off at this time.  Please reorder if she becomes appropriate.   Jeani Hawking, OTR/L Acute Rehabilitation Services Pager [phone] Office [phone]   Jeani Hawking M 01/06/2019, 9:48 AM

## 2019-01-06 NOTE — Progress Notes (Signed)
vLTM EEG maint complete. No skin breakdown continue to monitor

## 2019-01-06 NOTE — Progress Notes (Signed)
NAMEKayron Wang, MRN:  [id number], DOB:  [birth year], LOS: 4 ADMISSION DATE:  01/02/2019, CONSULTATION DATE:  3/5 REFERRING MD:  Dr. Jerral Ralph, CHIEF COMPLAINT:  Status epilepticus   Brief History   68 year old female admitted with AMS found to have status epilepticus refractory to the AEDs and required transfer to ICU for intubation and continuous infusion.   History of present illness   Patient is encephalopathic and/or intubated. Therefore history has been obtained from chart review. 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 She was last seen well by family 3/3 and had no complaints, but seemed to be a bit off balance. Then 3/5 she was found down with R gaze and was unresponsive. Initial imaging was unremarkable with the exception of suspected aspiration PNA RUL. EEG, however, demonstrated seizure. She was treated with Keppra and CAP coverage and admitted for continuous EEG. Despite escalating seizure medication she remained unresponsive with seizure on EEG. PCCM was consulted to transfer to ICU and possible intubation.   Past Medical History   has a past medical history of High cholesterol, Hypertension, and Stroke (HCC).   Significant Hospital Events   3/5 admit to floor, then ICU transfer.  3/6 intubated and on burst suppression per Neurology 3/8 versed stopped 3/9 propofol stopped.  Consults:  Neurology  Procedures:  ETT 3/6>>  Significant Diagnostic Tests:  CT head 3/5 > small vessel disease. No acute cortically based infarct or hemorrhage.  CT angio and perfusion 3/5 > negative large vessel occlusion. No core infarct or penumbra. Moderate to severe ICA stenosis. RUL pneumonia partially visible.  CT C-spine 3/5 > No cervical spine fracture or subluxation. Mild multilevel degenerative changes. EEG 3/5 >Continuous epileptiform discharge involving the right temporal region meeting the electrographic  criteria for for status epilepticus CXR 01/04/19 > Patchy airspace disease on right side in RUL and RML, ETT appropriate position, interval placement of L IJ CVC  Micro Data:  Blood 3/5 >  Blood 3/6 > Tracheal aspirate  3/6>  Antimicrobials:  Ceftriaxone 3/5  Flagyl 3/5  Unasyn 3/5 >>>  Interim history/subjective:  No recurrent seizures.   Objective   Blood pressure 117/71, pulse 86, temperature 98.6 F (37 C), resp. rate 18, height 5' (1.524 m), weight 69.4 kg, SpO2 97 %.    Vent Mode: PRVC FiO2 (%):  [30 %] 30 % Set Rate:  [15 bmp] 15 bmp Vt Set:  [360 mL] 360 mL PEEP:  [5 cmH20] 5 cmH20 Plateau Pressure:  [12 cmH20-16 cmH20] 13 cmH20   Intake/Output Summary (Last 24 hours) at 01/06/2019 1013 Last data filed at 01/06/2019 1000 Gross per 24 hour  Intake 4207.24 ml  Output 2180 ml  Net 2027.24 ml   Filed Weights   01/02/19 1715 01/05/19 0600 01/06/19 0500  Weight: 59 kg 68.4 kg 69.4 kg   Physical Exam: General: Thin female laying in bed, sedated, on mechanical ventilation. HENT: [place], AT, ETT in place, EEG leads in place Eyes: EOMI, no scleral icterus Respiratory: Clear to auscultation bilaterally.  No crackles, wheezing or rales Cardiovascular: RRR, -M/R/G, no JVD GI: BS+, soft, nontender Extremities:-Edema,-tenderness Neuro: Sedated, pupils 4mm and NR. No response to painful stimuli Skin: Intact, no rashes or bruising GU: Foley in place  cEEG (personally reviewed):  Remains burst suppressed  Resolved Hospital Problem list   Hypokalemia  Assessment & Plan:   Critically ill due to Status epilepticus -  no recurrent seizures and EEG still shows burst suppression.  Given accumulation of sedatives, make take several days for sensorium to clear. - AED management per neurology: Vimpat, Keppra, Dilantin - Off continuous sedation. - Continue cEEG to monitor for recurrent seizures.  Critically ill due to Acute hypoxemic respiratory insufficiency due to inability to  protect airway On minimal vent settings. Remain intubated due to SE and sedation Plan: - Full vent support. No plan for wean at this time. - Sedation for RASS goal 0 to -1, but will defer goal/dosing to neurology if required for burst suppression - VAP bundle - Daily SBT once sedation can be lightened  Sepsis shock secondarysuspected aspiration pneumonia Shock resolved. No longer requiring vasopressor. TTE with normal EF, no vegetations. BCX x 2 grew S. Epidermidis. Last negative BCx on 3/6. Plan: - Complete 5 days of Unasyn for aspiration pneumonia.  Possible CVA - Neurology following  Hypertension Hyperlipidemia - Will hold home diltiazem, atorvastatin until taking PO  Hyperchloremic acidosis, due to 0.9%. Poor UOP - improved in last 24 hours Likely iatrogenic with recent fluid administration - F/u BMP, monitor UOP - Maintain foley  Best practice:  Diet: TF at goal Pain/Anxiety/Delirium protocol (if indicated): Propofol and Versed now off. VAP protocol (if indicated): Yes  DVT prophylaxis: enoxaparin  GI prophylaxis: N/a  Glucose control: N/a Mobility: BR Code Status: FULL Family Communication: No family at bedside Disposition: ICU  Labs   CBC: Recent Labs  Lab 01/02/19 1127 01/03/19 0051 01/03/19 0239 01/04/19 0926 01/05/19 0503 01/06/19 0500  WBC 11.3*  --  12.6* 5.8 4.1 5.2  NEUTROABS 8.5*  --   --   --   --   --   HGB 14.9 10.5* 10.7* 8.8* 9.0* 9.1*  HCT 46.6* 31.0* 33.4* 28.3* 29.9* 30.1*  MCV 92.5  --  93.0 97.3 98.7 99.0  PLT 247  --  194 174 190 197    Basic Metabolic Panel: Recent Labs  Lab 01/02/19 1127 01/02/19 1526 01/03/19 0051 01/03/19 0239 01/04/19 0926 01/04/19 1342 01/04/19 1657 01/05/19 0503 01/05/19 1637 01/06/19 0500  NA 137  --  141 140 143  --   --  149*  --  147*  K 4.2  --  3.6 3.5 2.9*  --   --  4.1  --  3.7  CL 98  --   --  109 118*  --   --  126*  --  124*  CO2 25  --   --  23 18*  --   --  19*  --  20*  GLUCOSE  118*  --   --  139* 138*  --   --  113*  --  121*  BUN 20  --   --  14 8  --   --  9  --  11  CREATININE 1.01*  --   --  0.83 0.83  --   --  0.75  --  0.80  CALCIUM 10.0  --   --  8.2* 7.5*  --   --  7.3*  --  7.5*  MG  --  2.0  --   --  1.5*  --   --  2.2  --  1.9  PHOS  --  2.5  --   --   --  2.4* 2.4* 2.6 2.5  --    GFR: Estimated Creatinine Clearance: 61 mL/min (by C-G formula based on SCr of 0.8 mg/dL). Recent Labs  Lab 01/02/19 1358  01/03/19 0102 01/03/19 0239 01/03/19 1259 01/03/19 1650 01/04/19 0426 01/04/19 0926 01/05/19 0503 01/06/19 0500  PROCALCITON  --  0.18 0.17  --   --  0.13  --   --   --   WBC  --   --  12.6*  --   --   --  5.8 4.1 5.2  LATICACIDVEN 1.6  --   --  1.7 1.3  --   --   --   --     Liver Function Tests: Recent Labs  Lab 01/02/19 1127  AST 34  ALT 16  ALKPHOS 101  BILITOT 0.7  PROT 8.1  ALBUMIN 3.2*   No results for input(s): LIPASE, AMYLASE in the last 168 hours. Recent Labs  Lab 01/02/19 1526  AMMONIA 15    ABG    Component Value Date/Time   PHART 7.380 01/03/2019 0051   PCO2ART 44.8 01/03/2019 0051   PO2ART 432.0 (H) 01/03/2019 0051   HCO3 26.6 01/03/2019 0051   TCO2 28 01/03/2019 0051   O2SAT 100.0 01/03/2019 0051     Coagulation Profile: No results for input(s): INR, PROTIME in the last 168 hours.  Cardiac Enzymes: Recent Labs  Lab 01/02/19 1127  CKTOTAL 534*    HbA1C: Hgb A1c MFr Bld  Date/Time Value Ref Range Status  01/03/2019 02:39 AM 5.9 (H) 4.8 - 5.6 % Final    Comment:    (NOTE) Pre diabetes:          5.7%-6.4% Diabetes:              >6.4% Glycemic control for   <7.0% adults with diabetes   09/29/2013 04:15 AM 5.9 (H) <5.7 % Final    Comment:    (NOTE)                                                                       According to the ADA Clinical Practice Recommendations for 2011, when HbA1c is used as a screening test:  >=6.5%   Diagnostic of Diabetes Mellitus           (if abnormal result  is confirmed) 5.7-6.4%   Increased risk of developing Diabetes Mellitus References:Diagnosis and Classification of Diabetes Mellitus,Diabetes Care,2011,34(Suppl 1):S62-S69 and Standards of Medical Care in         Diabetes - 2011,Diabetes Care,2011,34 (Suppl 1):S11-S61.    CBG: Recent Labs  Lab 01/05/19 1555 01/05/19 1929 01/05/19 2320 01/06/19 0456 01/06/19 0813  GLUCAP 113* 98 103* 108* 95    Critical care time: 35 min including chart data review, examination of patient, multidisciplinary rounds, review of cEEG and frequent assessment and modification of ventilator and sedative therapy.   Lynnell Catalan, MD [facility] ICU Physician [facility] [place] [facility]  Pager: [phone] Mobile: [phone] After hours: [phone].  01/06/2019, 10:30 AM

## 2019-01-06 NOTE — Progress Notes (Signed)
PT Cancellation Note  Patient Details Name: Erika Wang MRN: [id number] DOB: 12-03-1950   Cancelled Treatment:    Reason Eval/Treat Not Completed: Medical issues which prohibited therapy(pt remains intubated ,non responsive and not currently appropriate. Will sign off and await new order)   Junie Engram B Serita Degroote 01/06/2019, 7:49 AM Delaney Meigs, PT Acute Rehabilitation Services Pager: [phone] Office: [phone]

## 2019-01-06 NOTE — Procedures (Signed)
LTM-EEG Report  HISTORY: Continuous video-EEG monitoring performed for15year old withAMS, L hemiparesis. ACQUISITION: International 10-20 system for electrode placement; 18 channels with additional eyes linked to ipsilateral ears and EKG. Additional T1-T2 electrodes were used. Continuous video recording obtained.   EEG NUMBER:  MEDICATIONS:  Day4:see EMR  DAY #4:from07303/8/20to 0730 01/06/19 BACKGROUND:This was a burst-suppression record with periods of low voltage suppression lasting 2-8 seconds, shortening as the record progressed. Bursts of irregular 4-7Hz  activity lasted 1-2 seconds and increased to 2-4 seconds overnight as sedation was tapered.  EPILEPTIFORM/PERIODIC ACTIVITY:none SEIZURES:none EVENTS:none reported  EKG: no significant arrhythmia  SUMMARY: This wasan abnormal continuous EEG due toa burst-suppression pattern, iatrogenic in etiology. Periods of suppression have decreased overnight as sedation was tapered. No further ictal or periodic activity was observed.

## 2019-01-07 ENCOUNTER — Inpatient Hospital Stay (HOSPITAL_COMMUNITY): Payer: Medicare HMO

## 2019-01-07 LAB — CBC
HCT: 31.1 % — ABNORMAL LOW (ref 36.0–46.0)
Hemoglobin: 9.4 g/dL — ABNORMAL LOW (ref 12.0–15.0)
MCH: 29.1 pg (ref 26.0–34.0)
MCHC: 30.2 g/dL (ref 30.0–36.0)
MCV: 96.3 fL (ref 80.0–100.0)
Platelets: 202 10*3/uL (ref 150–400)
RBC: 3.23 MIL/uL — ABNORMAL LOW (ref 3.87–5.11)
RDW: 15.9 % — ABNORMAL HIGH (ref 11.5–15.5)
WBC: 5.1 10*3/uL (ref 4.0–10.5)
nRBC: 0 % (ref 0.0–0.2)

## 2019-01-07 LAB — BASIC METABOLIC PANEL
Anion gap: 4 — ABNORMAL LOW (ref 5–15)
BUN: 11 mg/dL (ref 8–23)
CO2: 24 mmol/L (ref 22–32)
CREATININE: 0.65 mg/dL (ref 0.44–1.00)
Calcium: 8.2 mg/dL — ABNORMAL LOW (ref 8.9–10.3)
Chloride: 117 mmol/L — ABNORMAL HIGH (ref 98–111)
GFR calc non Af Amer: >60 mL/min (ref >60)
Glucose, Bld: 113 mg/dL — ABNORMAL HIGH (ref 70–99)
Potassium: 3.5 mmol/L (ref 3.5–5.1)
Sodium: 145 mmol/L (ref 135–145)

## 2019-01-07 LAB — GLUCOSE, CAPILLARY
GLUCOSE-CAPILLARY: 131 mg/dL — AB (ref 70–99)
GLUCOSE-CAPILLARY: 151 mg/dL — AB (ref 70–99)
Glucose-Capillary: 108 mg/dL — ABNORMAL HIGH (ref 70–99)
Glucose-Capillary: 127 mg/dL — ABNORMAL HIGH (ref 70–99)
Glucose-Capillary: 142 mg/dL — ABNORMAL HIGH (ref 70–99)
Glucose-Capillary: 142 mg/dL — ABNORMAL HIGH (ref 70–99)

## 2019-01-07 LAB — MAGNESIUM: MAGNESIUM: 1.9 mg/dL (ref 1.7–2.4)

## 2019-01-07 MED ORDER — FUROSEMIDE 10 MG/ML IJ SOLN
40.0000 mg | Freq: Once | INTRAMUSCULAR | Status: AC
  Administered 2019-01-07: 40 mg via INTRAVENOUS
  Filled 2019-01-07: qty 4

## 2019-01-07 NOTE — Progress Notes (Signed)
Pt transported on vent to MRI and returned to 4N28 without complications.

## 2019-01-07 NOTE — Procedures (Deleted)
Extubation Procedure Note  Patient Details:   Name: Erika Wang DOB: 1951-10-02 MRN: [id number]   Airway Documentation:    Vent end date: 01/07/19 Vent end time: 1011   Evaluation  O2 sats: stable throughout Complications: No apparent complications Patient did tolerate procedure well. Bilateral Breath Sounds: Clear, Diminished   Yes   Positive cuff leak noted.  Pt placed on [place] 4 L with humidity, no stridor noted. Pt able to reach 625 mL using incentive spirometer.  Forest Becker Taniya Dasher 01/07/2019, 10:45 AM

## 2019-01-07 NOTE — Progress Notes (Signed)
EEG can be discontinued per Dr. Otelia Limes

## 2019-01-07 NOTE — Progress Notes (Signed)
LTM EEG complete- electrodes removed. No skin breakdown.

## 2019-01-07 NOTE — Procedures (Signed)
CPT/Type of Study: 95720; 24hr EEG with video Recording Date: 01/06/2019 07:30 - 01/07/2019 07:30  Interpreting physician: Sherri Rad, DO  History: This is a 69 year old patient, undergoing an EEG to evaluate for seizures. Presented with altered mental status and left hemiparesis, found to have focal status epilepticus from the right hemisphere.   Technical Description: The EEG was performed using standard setting per the guidelines of American Clinical Neurophysiology Society (ACNS).   A minimum of 21 electrodes were placed on scalp according to the International 10-20 or/and 10-10 Systems. Supplemental electrodes were placed as needed. Single EKG electrode was also used to detect cardiac arrhythmia. Patient's behavior was continuously recorded on video simultaneously with EEG. A minimum of 16 channels were used for data display. Each epoch of study was reviewed manually daily and as needed using standard referential and bipolar montages. Computerized quantitative EEG analysis (such as compressed spectral array analysis, trending, automated spike & seizure detection) were used as indicated.   Clinical State: Stupor Background: Burst suppression pattern. Suppression lasts on average 2-4 seconds. Background became more continuous throughout the recording.   Sleep background: Normal sleep architecture was not seen Abnormalities: Continuous slow generalized Rhythmic or periodic pattern: None  Epileptiform activity: No Electrographic Seizure: No Events: No  Stimulation procedures:  Hyperventilation: Not done Photic stimulation: Not done  Impression: This EEG shows evidence of a severe diffuse encephalopathy. No epileptiform discharges or EEG seizures were recorded.

## 2019-01-07 NOTE — Progress Notes (Signed)
NAMEElidia Bonenfant, MRN:  [id number], DOB:  Mar 14, 1951, LOS: 5 ADMISSION DATE:  01/02/2019, CONSULTATION DATE:  3/5 REFERRING MD:  Dr. Jerral Ralph, CHIEF COMPLAINT:  Status epilepticus   Brief History   68 year old female admitted with AMS found to have status epilepticus refractory to the AEDs and required transfer to ICU for intubation and continuous infusion.   History of present illness   Patient is encephalopathic and/or intubated. Therefore history has been obtained from chart review. 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 She was last seen well by family 3/3 and had no complaints, but seemed to be a bit off balance. Then 3/5 she was found down with R gaze and was unresponsive. Initial imaging was unremarkable with the exception of suspected aspiration PNA RUL. EEG, however, demonstrated seizure. She was treated with Keppra and CAP coverage and admitted for continuous EEG. Despite escalating seizure medication she remained unresponsive with seizure on EEG. PCCM was consulted to transfer to ICU and possible intubation.   Past Medical History   has a past medical history of High cholesterol, Hypertension, and Stroke (HCC).   Significant Hospital Events   3/5 admit to floor, then ICU transfer.  3/6 intubated and on burst suppression per Neurology 3/8 versed stopped 3/9 propofol stopped.  Consults:  Neurology  Procedures:  ETT 3/6>>  Significant Diagnostic Tests:  CT head 3/5 > small vessel disease. No acute cortically based infarct or hemorrhage.  CT angio and perfusion 3/5 > negative large vessel occlusion. No core infarct or penumbra. Moderate to severe ICA stenosis. RUL pneumonia partially visible.  CT C-spine 3/5 > No cervical spine fracture or subluxation. Mild multilevel degenerative changes. EEG 3/5 >Continuous epileptiform discharge involving the right temporal region meeting the electrographic  criteria for for status epilepticus CXR 01/04/19 > Patchy airspace disease on right side in RUL and RML, ETT appropriate position, interval placement of L IJ CVC  Micro Data:  Blood 3/5 >  Blood 3/6 > Tracheal aspirate  3/6>  Antimicrobials:  Ceftriaxone 3/5  Flagyl 3/5  Unasyn 3/5 >>>  Interim history/subjective:  No recurrent seizures.  Remains unresponsive  Objective   Blood pressure (!) 153/82, pulse 80, temperature (!) 97.2 F (36.2 C), temperature source Esophageal, resp. rate 17, height 5' (1.524 m), weight 69.2 kg, SpO2 100 %.    Vent Mode: PRVC FiO2 (%):  [30 %] 30 % Set Rate:  [15 bmp] 15 bmp Vt Set:  [360 mL] 360 mL PEEP:  [5 cmH20] 5 cmH20 Plateau Pressure:  [13 cmH20-16 cmH20] 15 cmH20   Intake/Output Summary (Last 24 hours) at 01/07/2019 1006 Last data filed at 01/07/2019 0800 Gross per 24 hour  Intake 1302.5 ml  Output 2150 ml  Net -847.5 ml   Filed Weights   01/05/19 0600 01/06/19 0500 01/07/19 0500  Weight: 68.4 kg 69.4 kg 69.2 kg   Physical Exam: General: Thin female laying in bed, sedated, on mechanical ventilation. HENT: [place], AT, ETT in place, EEG leads in place Eyes: EOMI, no scleral icterus Respiratory: Clear to auscultation bilaterally.  No crackles, wheezing or rales Cardiovascular: RRR, -M/R/G, no JVD GI: BS+, soft, nontender Extremities:-Edema,-tenderness Neuro: Sedated, pupils 3mm and NR. No response to painful stimuli Skin: Intact, no rashes or bruising GU: Foley in place  cEEG (personally reviewed):  Remains burst suppressed.   Resolved Hospital Problem list   Hypokalemia  Assessment & Plan:  Critically ill due to Status epilepticus - no recurrent seizures and EEG still shows burst suppression.  Given accumulation of sedatives, make take several days for sensorium to clear. - AED management per neurology: Vimpat, Keppra, Dilantin - Off continuous sedation. - Will stop cEEG as pattern stable. - MRI to rule out HIE.  Critically  ill due to Acute hypoxemic respiratory insufficiency due to inability to protect airway On minimal vent settings. Remain intubated due to SE and sedation Plan: - Full vent support. No plan for wean at this time. - Sedation for RASS goal 0 to -1, but will defer goal/dosing to neurology if required for burst suppression - VAP bundle - Daily SBT once more awake - May require tracheostomy due to prolonged deconditioning  Hypertension Hyperlipidemia - Will hold home diltiazem, atorvastatin until taking PO - Diuresis today.   Best practice:  Diet: TF at goal Pain/Anxiety/Delirium protocol (if indicated): Propofol and Versed now off. VAP protocol (if indicated): Yes  DVT prophylaxis: enoxaparin  GI prophylaxis: N/a  Glucose control: N/a Mobility: BR Code Status: FULL Family Communication: No family at bedside Disposition: ICU  Labs   CBC: Recent Labs  Lab 01/02/19 1127  01/03/19 0239 01/04/19 0926 01/05/19 0503 01/06/19 0500 01/07/19 0500  WBC 11.3*  --  12.6* 5.8 4.1 5.2 5.1  NEUTROABS 8.5*  --   --   --   --   --   --   HGB 14.9   < > 10.7* 8.8* 9.0* 9.1* 9.4*  HCT 46.6*   < > 33.4* 28.3* 29.9* 30.1* 31.1*  MCV 92.5  --  93.0 97.3 98.7 99.0 96.3  PLT 247  --  194 174 190 197 202   < > = values in this interval not displayed.    Basic Metabolic Panel: Recent Labs  Lab 01/02/19 1526  01/03/19 0239 01/04/19 0926 01/04/19 1342 01/04/19 1657 01/05/19 0503 01/05/19 1637 01/06/19 0500 01/07/19 0500  NA  --    < > 140 143  --   --  149*  --  147* 145  K  --    < > 3.5 2.9*  --   --  4.1  --  3.7 3.5  CL  --   --  109 118*  --   --  126*  --  124* 117*  CO2  --   --  23 18*  --   --  19*  --  20* 24  GLUCOSE  --   --  139* 138*  --   --  113*  --  121* 113*  BUN  --   --  14 8  --   --  9  --  11 11  CREATININE  --   --  0.83 0.83  --   --  0.75  --  0.80 0.65  CALCIUM  --   --  8.2* 7.5*  --   --  7.3*  --  7.5* 8.2*  MG 2.0  --   --  1.5*  --   --  2.2  --  1.9  1.9  PHOS 2.5  --   --   --  2.4* 2.4* 2.6 2.5  --   --    < > = values in this interval not displayed.   GFR: Estimated Creatinine Clearance: 60.9 mL/min (by C-G formula based on SCr of 0.65 mg/dL). Recent Labs  Lab 01/02/19 1358 01/03/19 0102 01/03/19 0239 01/03/19 1259 01/03/19 1650 01/04/19 0426 01/04/19  2841 01/05/19 0503 01/06/19 0500 01/07/19 0500  PROCALCITON  --  0.18 0.17  --   --  0.13  --   --   --   --   WBC  --   --  12.6*  --   --   --  5.8 4.1 5.2 5.1  LATICACIDVEN 1.6  --   --  1.7 1.3  --   --   --   --   --     Liver Function Tests: Recent Labs  Lab 01/02/19 1127  AST 34  ALT 16  ALKPHOS 101  BILITOT 0.7  PROT 8.1  ALBUMIN 3.2*   No results for input(s): LIPASE, AMYLASE in the last 168 hours. Recent Labs  Lab 01/02/19 1526  AMMONIA 15    ABG    Component Value Date/Time   PHART 7.380 01/03/2019 0051   PCO2ART 44.8 01/03/2019 0051   PO2ART 432.0 (H) 01/03/2019 0051   HCO3 26.6 01/03/2019 0051   TCO2 28 01/03/2019 0051   O2SAT 100.0 01/03/2019 0051     Coagulation Profile: No results for input(s): INR, PROTIME in the last 168 hours.  Cardiac Enzymes: Recent Labs  Lab 01/02/19 1127  CKTOTAL 534*    HbA1C: Hgb A1c MFr Bld  Date/Time Value Ref Range Status  01/03/2019 02:39 AM 5.9 (H) 4.8 - 5.6 % Final    Comment:    (NOTE) Pre diabetes:          5.7%-6.4% Diabetes:              >6.4% Glycemic control for   <7.0% adults with diabetes   09/29/2013 04:15 AM 5.9 (H) <5.7 % Final    Comment:    (NOTE)                                                                       According to the ADA Clinical Practice Recommendations for 2011, when HbA1c is used as a screening test:  >=6.5%   Diagnostic of Diabetes Mellitus           (if abnormal result is confirmed) 5.7-6.4%   Increased risk of developing Diabetes Mellitus References:Diagnosis and Classification of Diabetes Mellitus,Diabetes Care,2011,34(Suppl 1):S62-S69 and  Standards of Medical Care in         Diabetes - 2011,Diabetes Care,2011,34 (Suppl 1):S11-S61.    CBG: Recent Labs  Lab 01/06/19 1536 01/06/19 2002 01/06/19 2345 01/07/19 0354 01/07/19 0811  GLUCAP 91 121* 107* 131* 108*    Critical care time: 35 min including chart data review, examination of patient, multidisciplinary rounds, review of cEEG and frequent assessment and modification of ventilator settings and coordination of care.   Lynnell Catalan, MD [facility] ICU Physician [facility] [place] [facility]  Pager: [phone] Mobile: [phone] After hours: [phone].  01/07/2019, 10:06 AM

## 2019-01-07 NOTE — Progress Notes (Signed)
Subjective: Still in burst suppression pattern > 24 hours after discontinuation of propofol.   Objective: Current vital signs: BP (!) 153/82 (BP Location: Left Arm)   Pulse 80   Temp (!) 97.2 F (36.2 C) (Esophageal)   Resp 17   Ht 5' (1.524 m)   Wt 69.2 kg   SpO2 100%   BMI 29.79 kg/m  Vital signs in last 24 hours: Temp:  [97 F (36.1 C)-98.8 F (37.1 C)] 97.2 F (36.2 C) (03/10 0800) Pulse Rate:  [78-90] 80 (03/10 0800) Resp:  [10-20] 17 (03/10 0800) BP: (110-153)/(61-82) 153/82 (03/10 0800) SpO2:  [96 %-100 %] 100 % (03/10 0800) FiO2 (%):  [30 %] 30 % (03/10 0800) Weight:  [69.2 kg] 69.2 kg (03/10 0500)  Intake/Output from previous day: 03/09 0701 - 03/10 0700 In: 1340.1 [I.V.:490.1; NG/GT:360; IV Piggyback:490] Out: 2730 [Urine:2720; Stool:10] Intake/Output this shift: Total I/O In: 409.7 [I.V.:19.7; NG/GT:390] Out: -  Nutritional status:  Diet Order            Diet NPO time specified  Diet effective now              Neurologic Exam: Ment: No responses to any external stimuli. No spontaneous eye opening or limb movement. Does not respond to voice.  CN: Pupils sluggish to unreactive. No blink to threat. Face flaccidly symmetric. Motor/Sensory: Flaccid tone x 4 with no movement to noxious stimuli Reflexes: 2+ bilateral brachioradialis and biceps. 1+ patellar reflexes. Toes mute  Bedside EEG review: Burst suppression pattern with some reactivity to sternal rub manifesting as decreased periods of voltage attenuation and increased slow wave activity.   Lab Results: Results for orders placed or performed during the hospital encounter of 01/02/19 (from the past 48 hour(s))  Glucose, capillary     Status: Abnormal   Collection Time: 01/05/19 11:33 AM  Result Value Ref Range   Glucose-Capillary 109 (H) 70 - 99 mg/dL   Comment 1 Notify RN    Comment 2 Document in Chart   Glucose, capillary     Status: Abnormal   Collection Time: 01/05/19  3:55 PM  Result Value  Ref Range   Glucose-Capillary 113 (H) 70 - 99 mg/dL   Comment 1 Notify RN    Comment 2 Document in Chart   Phosphorus     Status: None   Collection Time: 01/05/19  4:37 PM  Result Value Ref Range   Phosphorus 2.5 2.5 - 4.6 mg/dL    Comment: Performed at [facility], [place] [address] [place], [place] [address]  Glucose, capillary     Status: None   Collection Time: 01/05/19  7:29 PM  Result Value Ref Range   Glucose-Capillary 98 70 - 99 mg/dL  Glucose, capillary     Status: Abnormal   Collection Time: 01/05/19 11:20 PM  Result Value Ref Range   Glucose-Capillary 103 (H) 70 - 99 mg/dL  Glucose, capillary     Status: Abnormal   Collection Time: 01/06/19  4:56 AM  Result Value Ref Range   Glucose-Capillary 108 (H) 70 - 99 mg/dL  CBC     Status: Abnormal   Collection Time: 01/06/19  5:00 AM  Result Value Ref Range   WBC 5.2 4.0 - 10.5 K/uL   RBC 3.04 (L) 3.87 - 5.11 MIL/uL   Hemoglobin 9.1 (L) 12.0 - 15.0 g/dL   HCT 30.1 (L) 36.0 - 46.0 %   MCV 99.0 80.0 - 100.0 fL   MCH 29.9 26.0 - 34.0  pg   MCHC 30.2 30.0 - 36.0 g/dL   RDW 16.2 (H) 11.5 - 15.5 %   Platelets 197 150 - 400 K/uL   nRBC 0.0 0.0 - 0.2 %    Comment: Performed at [facility], [place] [address] [place], [place] [address]  Basic metabolic panel     Status: Abnormal   Collection Time: 01/06/19  5:00 AM  Result Value Ref Range   Sodium 147 (H) 135 - 145 mmol/L   Potassium 3.7 3.5 - 5.1 mmol/L   Chloride 124 (H) 98 - 111 mmol/L   CO2 20 (L) 22 - 32 mmol/L   Glucose, Bld 121 (H) 70 - 99 mg/dL   BUN 11 8 - 23 mg/dL   Creatinine, Ser 0.80 0.44 - 1.00 mg/dL   Calcium 7.5 (L) 8.9 - 10.3 mg/dL   GFR calc non Af Amer >60 >60 mL/min   GFR calc Af Amer >60 >60 mL/min   Anion gap 3 (L) 5 - 15    Comment: Performed at [facility], [place] [address], [place], [place] [address]  Magnesium     Status: None   Collection Time: 01/06/19  5:00 AM  Result Value Ref Range   Magnesium 1.9 1.7 - 2.4 mg/dL    Comment:  Performed at [facility], [place] [address], [place], [place] [address]  Triglycerides     Status: None   Collection Time: 01/06/19  6:03 AM  Result Value Ref Range   Triglycerides 44 <150 mg/dL    Comment: Performed at [place] [address] [place], [place] [address]  Glucose, capillary     Status: None   Collection Time: 01/06/19  8:13 AM  Result Value Ref Range   Glucose-Capillary 95 70 - 99 mg/dL   Comment 1 Notify RN    Comment 2 Document in Chart   Glucose, capillary     Status: Abnormal   Collection Time: 01/06/19 11:52 AM  Result Value Ref Range   Glucose-Capillary 104 (H) 70 - 99 mg/dL   Comment 1 Notify RN    Comment 2 Document in Chart   Glucose, capillary     Status: None   Collection Time: 01/06/19  3:36 PM  Result Value Ref Range   Glucose-Capillary 91 70 - 99 mg/dL   Comment 1 Notify RN    Comment 2 Document in Chart   Glucose, capillary     Status: Abnormal   Collection Time: 01/06/19  8:02 PM  Result Value Ref Range   Glucose-Capillary 121 (H) 70 - 99 mg/dL  Glucose, capillary     Status: Abnormal   Collection Time: 01/06/19 11:45 PM  Result Value Ref Range   Glucose-Capillary 107 (H) 70 - 99 mg/dL  Glucose, capillary     Status: Abnormal   Collection Time: 01/07/19  3:54 AM  Result Value Ref Range   Glucose-Capillary 131 (H) 70 - 99 mg/dL  CBC     Status: Abnormal   Collection Time: 01/07/19  5:00 AM  Result Value Ref Range   WBC 5.1 4.0 - 10.5 K/uL   RBC 3.23 (L) 3.87 - 5.11 MIL/uL   Hemoglobin 9.4 (L) 12.0 - 15.0 g/dL   HCT 31.1 (L) 36.0 - 46.0 %   MCV 96.3 80.0 - 100.0 fL   MCH 29.1 26.0 - 34.0 pg   MCHC 30.2 30.0 - 36.0 g/dL   RDW 15.9 (H) 11.5 - 15.5 %   Platelets  202 150 - 400 K/uL   nRBC 0.0 0.0 - 0.2 %    Comment: Performed at [facility], [place] [address] [place], [place] [address]  Basic metabolic panel     Status: Abnormal   Collection Time: 01/07/19  5:00 AM  Result Value Ref Range   Sodium 145 135 - 145 mmol/L    Potassium 3.5 3.5 - 5.1 mmol/L   Chloride 117 (H) 98 - 111 mmol/L   CO2 24 22 - 32 mmol/L   Glucose, Bld 113 (H) 70 - 99 mg/dL   BUN 11 8 - 23 mg/dL   Creatinine, Ser 0.65 0.44 - 1.00 mg/dL   Calcium 8.2 (L) 8.9 - 10.3 mg/dL   GFR calc non Af Amer >60 >60 mL/min   GFR calc Af Amer >60 >60 mL/min   Anion gap 4 (L) 5 - 15    Comment: Performed at [facility], [place] [address] [place], [place] [address]  Magnesium     Status: None   Collection Time: 01/07/19  5:00 AM  Result Value Ref Range   Magnesium 1.9 1.7 - 2.4 mg/dL    Comment: Performed at [facility], [place] [address], [place], [place] [address]  Glucose, capillary     Status: Abnormal   Collection Time: 01/07/19  8:11 AM  Result Value Ref Range   Glucose-Capillary 108 (H) 70 - 99 mg/dL    Recent Results (from the past 240 hour(s))  Blood Culture (routine x 2)     Status: Abnormal   Collection Time: 01/02/19 11:58 AM  Result Value Ref Range Status   Specimen Description BLOOD LEFT ANTECUBITAL  Final   Special Requests   Final    BOTTLES DRAWN AEROBIC AND ANAEROBIC Blood Culture adequate volume   Culture  Setup Time   Final    GRAM POSITIVE COCCI IN CLUSTERS IN BOTH AEROBIC AND ANAEROBIC BOTTLES CRITICAL RESULT CALLED TO, READ BACK BY AND VERIFIED WITH: Pierron RN AT 0715 ON [id number] BY SJW Performed at [facility], [place] [address], [place], [place] [address]    Culture STAPHYLOCOCCUS EPIDERMIDIS (A)  Final   Report Status 01/05/2019 FINAL  Final   Organism ID, Bacteria STAPHYLOCOCCUS EPIDERMIDIS  Final      Susceptibility   Staphylococcus epidermidis - MIC*    CIPROFLOXACIN <=0.5 SENSITIVE Sensitive     ERYTHROMYCIN <=0.25 SENSITIVE Sensitive     GENTAMICIN <=0.5 SENSITIVE Sensitive     OXACILLIN <=0.25 SENSITIVE Sensitive     TETRACYCLINE >=16 RESISTANT Resistant     VANCOMYCIN 1 SENSITIVE Sensitive     TRIMETH/SULFA <=10 SENSITIVE Sensitive     CLINDAMYCIN <=0.25 SENSITIVE Sensitive     RIFAMPIN  <=0.5 SENSITIVE Sensitive     Inducible Clindamycin NEGATIVE Sensitive     * STAPHYLOCOCCUS EPIDERMIDIS  Blood Culture ID Panel (Reflexed)     Status: Abnormal   Collection Time: 01/02/19 11:58 AM  Result Value Ref Range Status   Enterococcus species NOT DETECTED NOT DETECTED Final   Listeria monocytogenes NOT DETECTED NOT DETECTED Final   Staphylococcus species DETECTED (A) NOT DETECTED Final    Comment: Methicillin (oxacillin) susceptible coagulase negative staphylococcus. Possible blood culture contaminant (unless isolated from more than one blood culture draw or clinical case suggests pathogenicity). No antibiotic treatment is indicated for blood  culture contaminants. CRITICAL RESULT CALLED TO, READ BACK BY AND VERIFIED WITH: BRYK RN AT 0715 ON [id number] BY SJW    Staphylococcus aureus (  BCID) NOT DETECTED NOT DETECTED Final   Methicillin resistance NOT DETECTED NOT DETECTED Final   Streptococcus species NOT DETECTED NOT DETECTED Final   Streptococcus agalactiae NOT DETECTED NOT DETECTED Final   Streptococcus pneumoniae NOT DETECTED NOT DETECTED Final   Streptococcus pyogenes NOT DETECTED NOT DETECTED Final   Acinetobacter baumannii NOT DETECTED NOT DETECTED Final   Enterobacteriaceae species NOT DETECTED NOT DETECTED Final   Enterobacter cloacae complex NOT DETECTED NOT DETECTED Final   Escherichia coli NOT DETECTED NOT DETECTED Final   Klebsiella oxytoca NOT DETECTED NOT DETECTED Final   Klebsiella pneumoniae NOT DETECTED NOT DETECTED Final   Proteus species NOT DETECTED NOT DETECTED Final   Serratia marcescens NOT DETECTED NOT DETECTED Final   Haemophilus influenzae NOT DETECTED NOT DETECTED Final   Neisseria meningitidis NOT DETECTED NOT DETECTED Final   Pseudomonas aeruginosa NOT DETECTED NOT DETECTED Final   Candida albicans NOT DETECTED NOT DETECTED Final   Candida glabrata NOT DETECTED NOT DETECTED Final   Candida krusei NOT DETECTED NOT DETECTED Final   Candida  parapsilosis NOT DETECTED NOT DETECTED Final   Candida tropicalis NOT DETECTED NOT DETECTED Final    Comment: Performed at [facility], [address] [place], [place] [address]  Blood Culture (routine x 2)     Status: Abnormal   Collection Time: 01/02/19 12:03 PM  Result Value Ref Range Status   Specimen Description BLOOD SITE NOT SPECIFIED  Final   Special Requests   Final    BOTTLES DRAWN AEROBIC AND ANAEROBIC Blood Culture adequate volume   Culture  Setup Time   Final    GRAM POSITIVE COCCI IN BOTH AEROBIC AND ANAEROBIC BOTTLES CRITICAL VALUE NOTED.  VALUE IS CONSISTENT WITH PREVIOUSLY REPORTED AND CALLED VALUE.    Culture (A)  Final    STAPHYLOCOCCUS EPIDERMIDIS SUSCEPTIBILITIES PERFORMED ON PREVIOUS CULTURE WITHIN THE LAST 5 DAYS. Performed at [facility], [place] [address], [place], [place] [address]    Report Status 01/05/2019 FINAL  Final  MRSA PCR Screening     Status: None   Collection Time: 01/03/19  1:38 AM  Result Value Ref Range Status   MRSA by PCR NEGATIVE NEGATIVE Final    Comment:        The GeneXpert MRSA Assay (FDA approved for NASAL specimens only), is one component of a comprehensive MRSA colonization surveillance program. It is not intended to diagnose MRSA infection nor to guide or monitor treatment for MRSA infections. Performed at [facility], [place] [address] [place], [place] [address]   Culture, blood (routine x 2)     Status: None (Preliminary result)   Collection Time: 01/03/19 10:15 AM  Result Value Ref Range Status   Specimen Description BLOOD LEFT HAND  Final   Special Requests AEROBIC BOTTLE ONLY Blood Culture adequate volume  Final   Culture   Final    NO GROWTH 4 DAYS Performed at [facility], [place] [address], [place], [place] [address]    Report Status PENDING  Incomplete  Culture, blood (routine x 2)     Status: None (Preliminary result)   Collection Time: 01/03/19 10:21 AM  Result Value Ref Range Status    Specimen Description BLOOD LEFT ANTECUBITAL  Final   Special Requests   Final    BOTTLES DRAWN AEROBIC AND ANAEROBIC Blood Culture adequate volume   Culture   Final    NO GROWTH 4 DAYS Performed at [facility], [place]  [address] [place], [place] [address]    Report Status PENDING  Incomplete  Culture, respiratory (non-expectorated)     Status: None   Collection Time: 01/03/19  5:25 PM  Result Value Ref Range Status   Specimen Description TRACHEAL ASPIRATE  Final   Special Requests NONE  Final   Gram Stain   Final    ABUNDANT WBC PRESENT, PREDOMINANTLY MONONUCLEAR NO ORGANISMS SEEN    Culture   Final    RARE Consistent with normal respiratory flora. Performed at [facility], [place] [address], [place], [place] [address]    Report Status 01/06/2019 FINAL  Final    Lipid Panel Recent Labs    01/06/19 0603  TRIG 44    Studies/Results: No results found.  Medications:  Scheduled: . chlorhexidine gluconate (MEDLINE KIT)  15 mL Mouth Rinse BID  . enoxaparin (LOVENOX) injection  40 mg Subcutaneous Q24H  . feeding supplement (PRO-STAT SUGAR FREE 64)  30 mL Per Tube BID  . feeding supplement (VITAL HIGH PROTEIN)  1,000 mL Per Tube Q24H  . furosemide  40 mg Intravenous Once  . mouth rinse  15 mL Mouth Rinse 10 times per day  . multivitamin  15 mL Per Tube Daily  . pantoprazole sodium  40 mg Per Tube QHS  . phenytoin (DILANTIN) IV  100 mg Intravenous Q8H   Continuous: . sodium chloride 10 mL/hr at 01/07/19 0800  . ampicillin-sulbactam (UNASYN) IV 3 g (01/07/19 1010)  . lacosamide (VIMPAT) IV 200 mg (01/07/19 0849)  . levETIRAcetam Stopped (01/07/19 0542)   Official LTM interpretation from Monday: This was a burst-suppression record with periods of low voltage suppression lasting2-8seconds, shortening as the record progressed. Bursts of irregular 4-_0  activity lasted 1-2seconds and increased to 2-4 seconds overnight as sedation was tapered.   Official LTM  interpretation for this AM: Pending  Assessment: 22 year oldfemale with past medical history of stroke in 2014 who presents to the emergency room after being founddown.Last seen normal was 2 days PTA by her family. On arrival to emergency department patient had right gaze deviation and left hemiparesis. CT head showed no large acute right MCA stroke-stat CT angiogram negative for LVO and CT perfusion negative for any perfusion deficit. STAT EEG showed right hemispheric seizures. Patient was loaded with Keppra and then fosphenytoin; however, she continued to seize and was intubated for airway protection and burst suppression. Vimpat was added.Was in burst suppression for 48 hours (starting on 3/6) prior to being fully weaned off propofol on Monday. Now > 24 hours off all sedation with continued burst suppression on LTM EEG.   1. 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 Most likely component of DDx is now diffuse anoxic injury given that she was found down after last being seen normal 2 days previously. 2. On Vimpat 200 BID, Keppra 1500 BID and Dilantin 100 TID, with phenytoin level of 14.8 on 3/7 3. Bedside EEG reviiew: Burst suppression without electrographic seizures. Awaiting official LTM report. Waning burst suppression per preliminary read by Dr. Doree Albee.   Recommendations 1. Discontinuing LTM EEG in order to obtain MRI brain withoiut contrast.  2. Now off propofol > 24 hours.   3. Continue Vimpat, Keppra and Dilantin 4. Avoid cephalosporinsif possible for treatment of sepsis 5. Continue serial neurochecks   LOS: 5 days   35 minutes spent in the neurological evaluation and management of this critically ill patient. Time  spent included bedside EEG review.  _0  signed: Dr. Kerney Elbe 01/07/2019  10:12 AM

## 2019-01-08 ENCOUNTER — Inpatient Hospital Stay (HOSPITAL_COMMUNITY): Payer: Medicare HMO

## 2019-01-08 ENCOUNTER — Other Ambulatory Visit: Payer: Self-pay

## 2019-01-08 LAB — CBC
HCT: 38.2 % (ref 36.0–46.0)
Hemoglobin: 12 g/dL (ref 12.0–15.0)
MCH: 29.4 pg (ref 26.0–34.0)
MCHC: 31.4 g/dL (ref 30.0–36.0)
MCV: 93.6 fL (ref 80.0–100.0)
PLATELETS: 220 10*3/uL (ref 150–400)
RBC: 4.08 MIL/uL (ref 3.87–5.11)
RDW: 15.2 % (ref 11.5–15.5)
WBC: 6.3 10*3/uL (ref 4.0–10.5)
nRBC: 0 % (ref 0.0–0.2)

## 2019-01-08 LAB — GLUCOSE, CAPILLARY
Glucose-Capillary: 111 mg/dL — ABNORMAL HIGH (ref 70–99)
Glucose-Capillary: 100 mg/dL — ABNORMAL HIGH (ref 70–99)
Glucose-Capillary: 118 mg/dL — ABNORMAL HIGH (ref 70–99)
Glucose-Capillary: 87 mg/dL (ref 70–99)
Glucose-Capillary: 122 mg/dL — ABNORMAL HIGH (ref 70–99)
Glucose-Capillary: 163 mg/dL — ABNORMAL HIGH (ref 70–99)

## 2019-01-08 LAB — MAGNESIUM: Magnesium: 1.7 mg/dL (ref 1.7–2.4)

## 2019-01-08 LAB — CULTURE, BLOOD (ROUTINE X 2)
Culture: NO GROWTH
Culture: NO GROWTH
SPECIAL REQUESTS: ADEQUATE
Special Requests: ADEQUATE

## 2019-01-08 LAB — BASIC METABOLIC PANEL
ANION GAP: 11 (ref 5–15)
BUN: 14 mg/dL (ref 8–23)
CALCIUM: 8.8 mg/dL — AB (ref 8.9–10.3)
CO2: 28 mmol/L (ref 22–32)
Chloride: 104 mmol/L (ref 98–111)
Creatinine, Ser: 0.58 mg/dL (ref 0.44–1.00)
GFR calc Af Amer: >60 mL/min (ref >60)
GFR calc non Af Amer: >60 mL/min (ref >60)
Glucose, Bld: 130 mg/dL — ABNORMAL HIGH (ref 70–99)
Potassium: 3.2 mmol/L — ABNORMAL LOW (ref 3.5–5.1)
Sodium: 143 mmol/L (ref 135–145)

## 2019-01-08 MED ORDER — VITAL AF 1.2 CAL PO LIQD
1000.0000 mL | ORAL | Status: DC
  Administered 2019-01-08 – 2019-01-26 (×15): 1000 mL
  Filled 2019-01-08: qty 1000

## 2019-01-08 MED ORDER — POTASSIUM CHLORIDE 20 MEQ PO PACK
40.0000 meq | PACK | Freq: Two times a day (BID) | ORAL | Status: AC
  Administered 2019-01-08 – 2019-01-09 (×3): 40 meq via ORAL
  Filled 2019-01-08 (×3): qty 2

## 2019-01-08 MED ORDER — LACOSAMIDE 50 MG PO TABS
200.0000 mg | ORAL_TABLET | Freq: Two times a day (BID) | ORAL | Status: DC
  Administered 2019-01-08 – 2019-01-27 (×38): 200 mg
  Filled 2019-01-08 (×7): qty 1
  Filled 2019-01-08: qty 4
  Filled 2019-01-08 (×2): qty 1
  Filled 2019-01-08: qty 4
  Filled 2019-01-08 (×2): qty 1
  Filled 2019-01-08: qty 4
  Filled 2019-01-08 (×5): qty 1
  Filled 2019-01-08: qty 4
  Filled 2019-01-08 (×10): qty 1
  Filled 2019-01-08: qty 4
  Filled 2019-01-08 (×7): qty 1

## 2019-01-08 MED ORDER — LIDOCAINE HCL 1 % IJ SOLN
INTRAMUSCULAR | Status: DC | PRN
  Administered 2019-01-08: 10 mL

## 2019-01-08 MED ORDER — FUROSEMIDE 10 MG/ML IJ SOLN
40.0000 mg | Freq: Every day | INTRAMUSCULAR | Status: DC
  Filled 2019-01-08: qty 4

## 2019-01-08 MED ORDER — LEVETIRACETAM 100 MG/ML PO SOLN
1500.0000 mg | Freq: Two times a day (BID) | ORAL | Status: DC
  Administered 2019-01-08 – 2019-01-27 (×38): 1500 mg
  Filled 2019-01-08 (×38): qty 15

## 2019-01-08 MED ORDER — PRO-STAT SUGAR FREE PO LIQD
30.0000 mL | Freq: Three times a day (TID) | ORAL | Status: DC
  Administered 2019-01-08 – 2019-01-27 (×56): 30 mL
  Filled 2019-01-08 (×55): qty 30

## 2019-01-08 MED ORDER — LIDOCAINE HCL 1 % IJ SOLN
INTRAMUSCULAR | Status: AC
  Filled 2019-01-08: qty 20

## 2019-01-08 MED ORDER — PHENYTOIN 125 MG/5ML PO SUSP
100.0000 mg | Freq: Three times a day (TID) | ORAL | Status: DC
  Administered 2019-01-08 – 2019-01-09 (×6): 100 mg
  Filled 2019-01-08 (×7): qty 4

## 2019-01-08 MED ORDER — TORSEMIDE 20 MG PO TABS
20.0000 mg | ORAL_TABLET | Freq: Every day | ORAL | Status: AC
  Administered 2019-01-08 – 2019-01-10 (×3): 20 mg via ORAL
  Filled 2019-01-08 (×3): qty 1

## 2019-01-08 NOTE — Progress Notes (Signed)
IV team unable to place PIV, midline and states PICC will also be impossible. IV team communicated this to MD Agarwala.

## 2019-01-08 NOTE — Progress Notes (Signed)
Nutrition Follow-up  DOCUMENTATION CODES:   Not applicable  INTERVENTION:  - Will adjust TF regimen:  Vital AF 1.2 @ 30 ml/hr with 30 ml Prostat TID. This regimen will provide 1164 kcal, 99 grams of protein, and 584 ml free water. - Free water flush, if desired, to be per MD/NP.    NUTRITION DIAGNOSIS:   Inadequate oral intake related to inability to eat(pt sedated and ventilated ) as evidenced by NPO status. -ongoing  GOAL:   Patient will meet greater than or equal to 90% of their needs -to be met with TF regimen  MONITOR:   Vent status, TF tolerance, Weight trends, Labs, Skin  ASSESSMENT:   68 y.o. 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 Pt found to have PNA with sepsis. Pt now intubated s/p seizure.    Weight has been stable 3/8-3/10. Weight from 3/8 (68.4 kg) used to re-estimate nutrition needs. Patient remains intubated with OGT in place. She is receiving Vital High Protein @ 30 ml/hr with 30 ml Prostat BID. This regimen is providing 920 kcal, 93 grams of protein, and 602 ml free water. No family/visitors present at bedside.   Patient is no longer on drips. Versed was stopped on 3/8 and propofol was stopped on 3/9. Per RN note, patient remains without IV access and plan for PICC today, if possible.   Per Dr. Michelle Piper note this AM: status epilepticus without recurrent seizures but EEG still showing burst suppression, inability to protect airway with plan to remain on vent d/t current mental status, may require trach d/t prolonged deconditioning--plan to monitor through 3/14 for this.   Patient is currently intubated on ventilator support MV: 5.5 L/min Temp (24hrs), Avg:97.6 F (36.4 C), Min:97.5 F (36.4 C), Max:97.8 F (36.6 C) Propofol: none BP: 161/90 and MAP: 112  Medications reviewed; 15 ml  multivitamin per OGT/day, 100 mg dilantin per OGT TID started today at 10 AM, 40 mEq Klor-Con per OGT BID. Labs reviewed; CBGs: 100-118 mg/dl today, K: 3.2 mmol/l, Ca: 8.8 mg/dl.      Diet Order:   Diet Order            Diet NPO time specified  Diet effective now              EDUCATION NEEDS:   No education needs have been identified at this time  Skin:  Skin Assessment: Skin Integrity Issues: Skin Integrity Issues:: Other (Comment) Other: pressure injury sacrum  Last BM:  3/11  Height:   Ht Readings from Last 1 Encounters:  01/02/19 5' (1.524 m)    Weight:   Wt Readings from Last 1 Encounters:  01/07/19 69.2 kg    Ideal Body Weight:  45.4 kg  BMI:  Body mass index is 29.79 kg/m.  Estimated Nutritional Needs:   Kcal:  1179 kcal  Protein:  82-103 grams  Fluid:  >/= 1.5 L/day     Jarome Matin, MS, RD, LDN, [facility] Inpatient Clinical Dietitian Pager # [phone] After hours/weekend pager # [phone]

## 2019-01-08 NOTE — Progress Notes (Signed)
To IR with RN and RT monitoring continuously.

## 2019-01-08 NOTE — Progress Notes (Signed)
PIV came out during an incontinence clean-up. IV team restart ordered. This is pts only PIV and is difficult stick.

## 2019-01-08 NOTE — Progress Notes (Signed)
Pt transported from 4N28 to IR and back without incident.

## 2019-01-08 NOTE — Progress Notes (Signed)
Subjective: MRI completed overnight.  Objective: Current vital signs: BP (!) 170/87   Pulse 92   Temp 97.8 F (36.6 C) (Axillary)   Resp 16   Ht 5' (1.524 m)   Wt 69.2 kg   SpO2 99%   BMI 29.79 kg/m  Vital signs in last 24 hours: Temp:  [97 F (36.1 C)-97.8 F (36.6 C)] 97.8 F (36.6 C) (03/11 0400) Pulse Rate:  [80-95] 92 (03/11 0600) Resp:  [0-28] 16 (03/11 0600) BP: (133-196)/(70-103) 170/87 (03/11 0600) SpO2:  [97 %-100 %] 99 % (03/11 0758) FiO2 (%):  [30 %] 30 % (03/11 0758)  Intake/Output from previous day: 03/10 0701 - 03/11 0700 In: 2071.8 [I.V.:195.6; NG/GT:1200; IV Piggyback:676.2] Out: 3300 [Urine:3300] Intake/Output this shift: No intake/output data recorded. Nutritional status:  Diet Order            Diet NPO time specified  Diet effective now             HEENT: [place]/At Lungs: Intubated  Ext: Warm and well perfused  Neurologic Exam: Ment: No eye opening to any stimulus. No response to verbal commands. No purposeful movements. Triple flexion response of lower extremities to plantar stimulation.  CN: Pupils sluggishly reactive and equal. No blink to threat. Weak doll's eye reflex bilaterally. Face flaccidly symmetric. Cough reflex intact. Furrows brow slightly when coughing.  Motor/Sensory: Flaccid tone x 4. No movement of upper extremities to noxious. Flexes right ankle and internally rotates in conjunction with weak knee flexion in response to plantar stimulation. Similar response on left but weaker and with no knee flexion.  Reflexes: 3+ bilateral brachioradialis. 2+ patellae bilaterally.   Lab Results: Results for orders placed or performed during the hospital encounter of 01/02/19 (from the past 48 hour(s))  Glucose, capillary     Status: Abnormal   Collection Time: 01/06/19 11:52 AM  Result Value Ref Range   Glucose-Capillary 104 (H) 70 - 99 mg/dL   Comment 1 Notify RN    Comment 2 Document in Chart   Glucose, capillary     Status: None    Collection Time: 01/06/19  3:36 PM  Result Value Ref Range   Glucose-Capillary 91 70 - 99 mg/dL   Comment 1 Notify RN    Comment 2 Document in Chart   Glucose, capillary     Status: Abnormal   Collection Time: 01/06/19  8:02 PM  Result Value Ref Range   Glucose-Capillary 121 (H) 70 - 99 mg/dL  Glucose, capillary     Status: Abnormal   Collection Time: 01/06/19 11:45 PM  Result Value Ref Range   Glucose-Capillary 107 (H) 70 - 99 mg/dL  Glucose, capillary     Status: Abnormal   Collection Time: 01/07/19  3:54 AM  Result Value Ref Range   Glucose-Capillary 131 (H) 70 - 99 mg/dL  CBC     Status: Abnormal   Collection Time: 01/07/19  5:00 AM  Result Value Ref Range   WBC 5.1 4.0 - 10.5 K/uL   RBC 3.23 (L) 3.87 - 5.11 MIL/uL   Hemoglobin 9.4 (L) 12.0 - 15.0 g/dL   HCT 31.1 (L) 36.0 - 46.0 %   MCV 96.3 80.0 - 100.0 fL   MCH 29.1 26.0 - 34.0 pg   MCHC 30.2 30.0 - 36.0 g/dL   RDW 15.9 (H) 11.5 - 15.5 %   Platelets 202 150 - 400 K/uL   nRBC 0.0 0.0 - 0.2 %    Comment: Performed at [facility],  [address] [place], [place] [address]  Basic metabolic panel     Status: Abnormal   Collection Time: 01/07/19  5:00 AM  Result Value Ref Range   Sodium 145 135 - 145 mmol/L   Potassium 3.5 3.5 - 5.1 mmol/L   Chloride 117 (H) 98 - 111 mmol/L   CO2 24 22 - 32 mmol/L   Glucose, Bld 113 (H) 70 - 99 mg/dL   BUN 11 8 - 23 mg/dL   Creatinine, Ser 0.65 0.44 - 1.00 mg/dL   Calcium 8.2 (L) 8.9 - 10.3 mg/dL   GFR calc non Af Amer >60 >60 mL/min   GFR calc Af Amer >60 >60 mL/min   Anion gap 4 (L) 5 - 15    Comment: Performed at [facility], [place] [address], [place], [place] [address]  Magnesium     Status: None   Collection Time: 01/07/19  5:00 AM  Result Value Ref Range   Magnesium 1.9 1.7 - 2.4 mg/dL    Comment: Performed at [facility], [place] [address], [place], [place] [address]  Glucose, capillary     Status: Abnormal   Collection Time: 01/07/19  8:11 AM  Result Value  Ref Range   Glucose-Capillary 108 (H) 70 - 99 mg/dL  Glucose, capillary     Status: Abnormal   Collection Time: 01/07/19 12:05 PM  Result Value Ref Range   Glucose-Capillary 127 (H) 70 - 99 mg/dL   Comment 1 Notify RN    Comment 2 Document in Chart   Glucose, capillary     Status: Abnormal   Collection Time: 01/07/19  4:19 PM  Result Value Ref Range   Glucose-Capillary 142 (H) 70 - 99 mg/dL   Comment 1 Notify RN    Comment 2 Document in Chart   Glucose, capillary     Status: Abnormal   Collection Time: 01/07/19  8:07 PM  Result Value Ref Range   Glucose-Capillary 142 (H) 70 - 99 mg/dL  Glucose, capillary     Status: Abnormal   Collection Time: 01/07/19 11:40 PM  Result Value Ref Range   Glucose-Capillary 151 (H) 70 - 99 mg/dL  Glucose, capillary     Status: Abnormal   Collection Time: 01/08/19  3:41 AM  Result Value Ref Range   Glucose-Capillary 111 (H) 70 - 99 mg/dL  Glucose, capillary     Status: Abnormal   Collection Time: 01/08/19  8:29 AM  Result Value Ref Range   Glucose-Capillary 100 (H) 70 - 99 mg/dL   Comment 1 Notify RN    Comment 2 Document in Chart     Recent Results (from the past 240 hour(s))  Blood Culture (routine x 2)     Status: Abnormal   Collection Time: 01/02/19 11:58 AM  Result Value Ref Range Status   Specimen Description BLOOD LEFT ANTECUBITAL  Final   Special Requests   Final    BOTTLES DRAWN AEROBIC AND ANAEROBIC Blood Culture adequate volume   Culture  Setup Time   Final    GRAM POSITIVE COCCI IN CLUSTERS IN BOTH AEROBIC AND ANAEROBIC BOTTLES CRITICAL RESULT CALLED TO, READ BACK BY AND VERIFIED WITH: [place] RN AT 0715 ON [id number] BY SJW Performed at [facility], [place] [address] [place], [place] [address]    Culture STAPHYLOCOCCUS EPIDERMIDIS (A)  Final   Report Status 01/05/2019 FINAL  Final   Organism ID, Bacteria STAPHYLOCOCCUS EPIDERMIDIS  Final      Susceptibility  Staphylococcus epidermidis - MIC*    CIPROFLOXACIN <=0.5 SENSITIVE  Sensitive     ERYTHROMYCIN <=0.25 SENSITIVE Sensitive     GENTAMICIN <=0.5 SENSITIVE Sensitive     OXACILLIN <=0.25 SENSITIVE Sensitive     TETRACYCLINE >=16 RESISTANT Resistant     VANCOMYCIN 1 SENSITIVE Sensitive     TRIMETH/SULFA <=10 SENSITIVE Sensitive     CLINDAMYCIN <=0.25 SENSITIVE Sensitive     RIFAMPIN <=0.5 SENSITIVE Sensitive     Inducible Clindamycin NEGATIVE Sensitive     * STAPHYLOCOCCUS EPIDERMIDIS  Blood Culture ID Panel (Reflexed)     Status: Abnormal   Collection Time: 01/02/19 11:58 AM  Result Value Ref Range Status   Enterococcus species NOT DETECTED NOT DETECTED Final   Listeria monocytogenes NOT DETECTED NOT DETECTED Final   Staphylococcus species DETECTED (A) NOT DETECTED Final    Comment: Methicillin (oxacillin) susceptible coagulase negative staphylococcus. Possible blood culture contaminant (unless isolated from more than one blood culture draw or clinical case suggests pathogenicity). No antibiotic treatment is indicated for blood  culture contaminants. CRITICAL RESULT CALLED TO, READ BACK BY AND VERIFIED WITH: BRYK RN AT 0715 ON [id number] BY SJW    Staphylococcus aureus (BCID) NOT DETECTED NOT DETECTED Final   Methicillin resistance NOT DETECTED NOT DETECTED Final   Streptococcus species NOT DETECTED NOT DETECTED Final   Streptococcus agalactiae NOT DETECTED NOT DETECTED Final   Streptococcus pneumoniae NOT DETECTED NOT DETECTED Final   Streptococcus pyogenes NOT DETECTED NOT DETECTED Final   Acinetobacter baumannii NOT DETECTED NOT DETECTED Final   Enterobacteriaceae species NOT DETECTED NOT DETECTED Final   Enterobacter cloacae complex NOT DETECTED NOT DETECTED Final   Escherichia coli NOT DETECTED NOT DETECTED Final   Klebsiella oxytoca NOT DETECTED NOT DETECTED Final   Klebsiella pneumoniae NOT DETECTED NOT DETECTED Final   Proteus species NOT DETECTED NOT DETECTED Final   Serratia marcescens NOT DETECTED NOT DETECTED Final   Haemophilus influenzae  NOT DETECTED NOT DETECTED Final   Neisseria meningitidis NOT DETECTED NOT DETECTED Final   Pseudomonas aeruginosa NOT DETECTED NOT DETECTED Final   Candida albicans NOT DETECTED NOT DETECTED Final   Candida glabrata NOT DETECTED NOT DETECTED Final   Candida krusei NOT DETECTED NOT DETECTED Final   Candida parapsilosis NOT DETECTED NOT DETECTED Final   Candida tropicalis NOT DETECTED NOT DETECTED Final    Comment: Performed at [facility], [address] [place], [place] [address]  Blood Culture (routine x 2)     Status: Abnormal   Collection Time: 01/02/19 12:03 PM  Result Value Ref Range Status   Specimen Description BLOOD SITE NOT SPECIFIED  Final   Special Requests   Final    BOTTLES DRAWN AEROBIC AND ANAEROBIC Blood Culture adequate volume   Culture  Setup Time   Final    GRAM POSITIVE COCCI IN BOTH AEROBIC AND ANAEROBIC BOTTLES CRITICAL VALUE NOTED.  VALUE IS CONSISTENT WITH PREVIOUSLY REPORTED AND CALLED VALUE.    Culture (A)  Final    STAPHYLOCOCCUS EPIDERMIDIS SUSCEPTIBILITIES PERFORMED ON PREVIOUS CULTURE WITHIN THE LAST 5 DAYS. Performed at [facility], [place] [address], [place],  [address]    Report Status 01/05/2019 FINAL  Final  MRSA PCR Screening     Status: None   Collection Time: 01/03/19  1:38 AM  Result Value Ref Range Status   MRSA by PCR NEGATIVE NEGATIVE Final    Comment:        The GeneXpert MRSA Assay (FDA approved for  NASAL specimens only), is one component of a comprehensive MRSA colonization surveillance program. It is not intended to diagnose MRSA infection nor to guide or monitor treatment for MRSA infections. Performed at [facility], [place] [address] [place], [place] [address]   Culture, blood (routine x 2)     Status: None   Collection Time: 01/03/19 10:15 AM  Result Value Ref Range Status   Specimen Description BLOOD LEFT HAND  Final   Special Requests AEROBIC BOTTLE ONLY Blood Culture adequate volume  Final    Culture   Final    NO GROWTH 5 DAYS Performed at [facility], [place] [address] [place], [place] [address]    Report Status 01/08/2019 FINAL  Final  Culture, blood (routine x 2)     Status: None   Collection Time: 01/03/19 10:21 AM  Result Value Ref Range Status   Specimen Description BLOOD LEFT ANTECUBITAL  Final   Special Requests   Final    BOTTLES DRAWN AEROBIC AND ANAEROBIC Blood Culture adequate volume   Culture   Final    NO GROWTH 5 DAYS Performed at [facility], [place] [address] [place], [place] [address]    Report Status 01/08/2019 FINAL  Final  Culture, respiratory (non-expectorated)     Status: None   Collection Time: 01/03/19  5:25 PM  Result Value Ref Range Status   Specimen Description TRACHEAL ASPIRATE  Final   Special Requests NONE  Final   Gram Stain   Final    ABUNDANT WBC PRESENT, PREDOMINANTLY MONONUCLEAR NO ORGANISMS SEEN    Culture   Final    RARE Consistent with normal respiratory flora. Performed at [facility], [place] [address] [place], [place] [address]    Report Status 01/06/2019 FINAL  Final    Lipid Panel Recent Labs    01/06/19 0603  TRIG 44    Studies/Results: Mr Brain Wo Contrast  Result Date: 01/07/2019 CLINICAL DATA:  Encephalopathy. Altered mental status. Possible anoxic brain injury. EXAM: MRI HEAD WITHOUT CONTRAST TECHNIQUE: Multiplanar, multiecho pulse sequences of the brain and surrounding structures were obtained without intravenous contrast. COMPARISON:  CTA head neck and perfusion, 01/02/2019 FINDINGS: BRAIN: There is no acute infarct, acute hemorrhage, hydrocephalus or extra-axial collection. The midline structures are normal. Old small vessel infarcts of the right basal ganglia. Diffuse confluent hyperintense T2-weighted signal within the periventricular, deep and juxtacortical white matter, most commonly due to chronic ischemic microangiopathy. Advanced atrophy for age. Susceptibility-sensitive sequences show no  chronic microhemorrhage or superficial siderosis. VASCULAR: Major intracranial arterial and venous sinus flow voids are normal. SKULL AND UPPER CERVICAL SPINE: Calvarial bone marrow signal is normal. There is no skull base mass. Visualized upper cervical spine and soft tissues are normal. SINUSES/ORBITS: Mastoid effusions. Fluid within the sphenoid sinuses and nasopharynx. Mild maxillary and ethmoid mucosal thickening. The orbits are normal. IMPRESSION: 1. No acute intracranial abnormality. No findings of acute hypoxic ischemic injury. 2. 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 Electronically Signed   By: Ulyses Jarred M.D.   On: 01/07/2019 15:37    Medications:  Scheduled: . chlorhexidine gluconate (MEDLINE KIT)  15 mL Mouth Rinse BID  . enoxaparin (LOVENOX) injection  40 mg Subcutaneous Q24H  . feeding supplement (PRO-STAT SUGAR FREE 64)  30 mL Per Tube BID  . feeding supplement (VITAL HIGH PROTEIN)  1,000 mL Per Tube Q24H  . mouth rinse  15 mL Mouth Rinse 10  times per day  . multivitamin  15 mL Per Tube Daily  . pantoprazole sodium  40 mg Per Tube QHS  . phenytoin (DILANTIN) IV  100 mg Intravenous Q8H   Continuous: . sodium chloride 10 mL/hr at 01/08/19 0600  . lacosamide (VIMPAT) IV Stopped (01/07/19 2312)  . levETIRAcetam Stopped (01/08/19 0539)    Assessment:79 year oldfemale with past medical history of stroke in 2014who presents to the emergency room after being founddown.Last seen normal was 2 days PTAby her family. STATEEG showed right hemispheric seizures. Patient was loaded with Keppra and then fosphenytoin;however, shecontinued to seize and wasintubated for airway protection and burst suppression. Vimpat wasadded.Wasin burst suppression for 48 hours (starting on 3/6) prior to being fully weaned off propofol on Monday. Now > 48 hours off all sedation, with no clinical seizure activity.   1.Status epilepticus,  resolved. EEG from Tuesday showed evidence of a severe diffuse encephalopathy; no epileptiform discharges or EEG seizures were recorded. 2.  Most likely component of DDx is now diffuse anoxic injury given that she was found down after last being seen normal 2 days previously. Although her MRI brain is negative for anoxic brain injury findings, MRI imaging is often unremarkable in cases where the injury is moderate or severe but not necessarily catastrophic.  3. Of note, there is moderate to severe atrophy on her MRI, including severe bilateral hippocampal atrophy, consistent with decreased neurological reserve. This likely portends a poorer long term outcome relative to patients with a similar clinical picture but without advanced cerebral atrophy. 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  4. On Vimpat200 BID, Keppra1500 BIDand Dilantin 100 TID, with phenytoin level of 14.8 on 3/7 .  Recommendations 1.Continue Vimpat,Keppra and Dilantin 2.Continue serial neurochecks 3. Prognosis for a meaningful neurological recovery is now felt more likely to be poor, given the atrophy on MRI, burst suppression pattern off all sedation > 24 hours on most recent EEG and clinical history most consistent with anoxic brain injury.  4. Will reassess on Friday.   35 minutes spent in the neurological evaluation and management of this critically ill patient.    LOS: 6 days   '@Electronically'$  signed: Dr. Kerney Elbe 01/08/2019  8:48 AM

## 2019-01-08 NOTE — Progress Notes (Signed)
Called to IR, spoke with Herbert Seta, up to Dr Grace Isaac in IR as to what time/ if PICC line will be placed today in IR. She remains without IV access. Dr Denese Killings notified.

## 2019-01-08 NOTE — Progress Notes (Signed)
Pt still without IV access. IR Venous access has been ordered. Called to IR to ask when she can be done. IR has pt on the table then will work on getting her down. IR unable to tell me exactly what time this will be.

## 2019-01-08 NOTE — Progress Notes (Signed)
NAMEAreanna Wang, MRN:  [id number], DOB:  Dec 05, 1950, LOS: 6 ADMISSION DATE:  01/02/2019, CONSULTATION DATE:  3/5 REFERRING MD:  Dr. Jerral Ralph, CHIEF COMPLAINT:  Status epilepticus   Brief History   68 year old female admitted with AMS found to have status epilepticus refractory to the AEDs and required transfer to ICU for intubation and continuous infusion.   History of present illness   Patient is encephalopathic and/or intubated. Therefore history has been obtained from chart review. 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 She was last seen well by family 3/3 and had no complaints, but seemed to be a bit off balance. Then 3/5 she was found down with R gaze and was unresponsive. Initial imaging was unremarkable with the exception of suspected aspiration PNA RUL. EEG, however, demonstrated seizure. She was treated with Keppra and CAP coverage and admitted for continuous EEG. Despite escalating seizure medication she remained unresponsive with seizure on EEG. PCCM was consulted to transfer to ICU and possible intubation.   Past Medical History   has a past medical history of High cholesterol, Hypertension, and Stroke (HCC).   Significant Hospital Events   3/5 admit to floor, then ICU transfer.  3/6 intubated and on burst suppression per Neurology 3/8 versed stopped 3/9 propofol stopped.  Consults:  Neurology  Procedures:  ETT 3/6>>  Significant Diagnostic Tests:  CT head 3/5 > small vessel disease. No acute cortically based infarct or hemorrhage.  CT angio and perfusion 3/5 > negative large vessel occlusion. No core infarct or penumbra. Moderate to severe ICA stenosis. RUL pneumonia partially visible.  CT C-spine 3/5 > No cervical spine fracture or subluxation. Mild multilevel degenerative changes. EEG 3/5 >Continuous epileptiform discharge involving the right temporal region meeting the electrographic  criteria for for status epilepticus CXR 01/04/19 > Patchy airspace disease on right side in RUL and RML, ETT appropriate position, interval placement of L IJ CVC MRI 01/07/19 > Chronic microvascular disease and atrophy but nil acute (personally reviewed)  Micro Data:  Blood 3/5 >  Blood 3/6 > Tracheal aspirate  3/6>  Antimicrobials:  Ceftriaxone 3/5  Flagyl 3/5  Unasyn 3/5 >>>  Interim history/subjective:  No recurrent seizures. Now grimacing and resisting oral care.  Objective   Blood pressure (!) 170/87, pulse 92, temperature 97.8 F (36.6 C), temperature source Axillary, resp. rate 16, height 5' (1.524 m), weight 69.2 kg, SpO2 99 %.    Vent Mode: CPAP;PSV FiO2 (%):  [30 %] 30 % Set Rate:  [15 bmp] 15 bmp Vt Set:  [360 mL] 360 mL PEEP:  [5 cmH20] 5 cmH20 Pressure Support:  [10 cmH20] 10 cmH20 Plateau Pressure:  [13 cmH20-15 cmH20] 14 cmH20   Intake/Output Summary (Last 24 hours) at 01/08/2019 0900 Last data filed at 01/08/2019 0600 Gross per 24 hour  Intake 1662.09 ml  Output 3300 ml  Net -1637.91 ml   Filed Weights   01/05/19 0600 01/06/19 0500 01/07/19 0500  Weight: 68.4 kg 69.4 kg 69.2 kg   Physical Exam: General: Thin female laying in bed, sedated, on mechanical ventilation. HENT: [place], AT, ETT in place, EEG leads in place Eyes: pupils 78mm and reactive, no scleral icterus Respiratory: Clear to auscultation bilaterally.  No crackles, wheezing or rales. Tolerating PSV 10/5 Cardiovascular: RRR, -M/R/G, no JVD GI: BS+, soft, nontender Extremities:-Edema,-tenderness Neuro: Sedation off x48h , now has doll's eyes and spontaneously roving eye movements. Some  facial grimacing to oral care. No response to central or limb pain. Skin: Intact, no rashes or bruising GU: Foley in place  Resolved Hospital Problem list   Hypokalemia  Assessment & Plan:   Critically ill due to Status epilepticus - no recurrent seizures and EEG still shows burst suppression.  Given accumulation  of sedatives, make take several days for sensorium to clear. MRI shows nil acute. - AED management per neurology: Vimpat, Keppra, Dilantin - Off continuous sedation.  Critically ill due to Acute hypoxemic respiratory insufficiency due to inability to protect airway On minimal vent settings. Remain intubated due to current mental status Plan: - Daily weaning trial - VAP bundle - Daily SBT once more awake - May require tracheostomy due to prolonged deconditioning.  Will give time through 3/14 prior to deciding regarding tracheostomy.  Hypertension Hyperlipidemia - Will hold home diltiazem, atorvastatin until taking PO - Continue diuresis today.   Best practice:  Diet: TF at goal Pain/Anxiety/Delirium protocol (if indicated): Propofol and Versed now off. VAP protocol (if indicated): Yes  DVT prophylaxis: enoxaparin  GI prophylaxis: N/a  Glucose control: N/a Mobility: BR Code Status: FULL Family Communication: No family at bedside Disposition: ICU  Labs   CBC: Recent Labs  Lab 01/02/19 1127  01/03/19 0239 01/04/19 0926 01/05/19 0503 01/06/19 0500 01/07/19 0500  WBC 11.3*  --  12.6* 5.8 4.1 5.2 5.1  NEUTROABS 8.5*  --   --   --   --   --   --   HGB 14.9   < > 10.7* 8.8* 9.0* 9.1* 9.4*  HCT 46.6*   < > 33.4* 28.3* 29.9* 30.1* 31.1*  MCV 92.5  --  93.0 97.3 98.7 99.0 96.3  PLT 247  --  194 174 190 197 202   < > = values in this interval not displayed.    Basic Metabolic Panel: Recent Labs  Lab 01/02/19 1526  01/03/19 0239 01/04/19 0926 01/04/19 1342 01/04/19 1657 01/05/19 0503 01/05/19 1637 01/06/19 0500 01/07/19 0500  NA  --    < > 140 143  --   --  149*  --  147* 145  K  --    < > 3.5 2.9*  --   --  4.1  --  3.7 3.5  CL  --   --  109 118*  --   --  126*  --  124* 117*  CO2  --   --  23 18*  --   --  19*  --  20* 24  GLUCOSE  --   --  139* 138*  --   --  113*  --  121* 113*  BUN  --   --  14 8  --   --  9  --  11 11  CREATININE  --   --  0.83 0.83  --   --   0.75  --  0.80 0.65  CALCIUM  --   --  8.2* 7.5*  --   --  7.3*  --  7.5* 8.2*  MG 2.0  --   --  1.5*  --   --  2.2  --  1.9 1.9  PHOS 2.5  --   --   --  2.4* 2.4* 2.6 2.5  --   --    < > = values in this interval not displayed.   GFR: Estimated Creatinine Clearance: 60.9 mL/min (by C-G formula based on SCr of 0.65 mg/dL). Recent Labs  Lab  01/02/19 1358 01/03/19 0102 01/03/19 0239 01/03/19 1259 01/03/19 1650 01/04/19 0426 01/04/19 0926 01/05/19 0503 01/06/19 0500 01/07/19 0500  PROCALCITON  --  0.18 0.17  --   --  0.13  --   --   --   --   WBC  --   --  12.6*  --   --   --  5.8 4.1 5.2 5.1  LATICACIDVEN 1.6  --   --  1.7 1.3  --   --   --   --   --     Liver Function Tests: Recent Labs  Lab 01/02/19 1127  AST 34  ALT 16  ALKPHOS 101  BILITOT 0.7  PROT 8.1  ALBUMIN 3.2*   No results for input(s): LIPASE, AMYLASE in the last 168 hours. Recent Labs  Lab 01/02/19 1526  AMMONIA 15    ABG    Component Value Date/Time   PHART 7.380 01/03/2019 0051   PCO2ART 44.8 01/03/2019 0051   PO2ART 432.0 (H) 01/03/2019 0051   HCO3 26.6 01/03/2019 0051   TCO2 28 01/03/2019 0051   O2SAT 100.0 01/03/2019 0051     Coagulation Profile: No results for input(s): INR, PROTIME in the last 168 hours.  Cardiac Enzymes: Recent Labs  Lab 01/02/19 1127  CKTOTAL 534*    HbA1C: Hgb A1c MFr Bld  Date/Time Value Ref Range Status  01/03/2019 02:39 AM 5.9 (H) 4.8 - 5.6 % Final    Comment:    (NOTE) Pre diabetes:          5.7%-6.4% Diabetes:              >6.4% Glycemic control for   <7.0% adults with diabetes   09/29/2013 04:15 AM 5.9 (H) <5.7 % Final    Comment:    (NOTE)                                                                       According to the ADA Clinical Practice Recommendations for 2011, when HbA1c is used as a screening test:  >=6.5%   Diagnostic of Diabetes Mellitus           (if abnormal result is confirmed) 5.7-6.4%   Increased risk of developing  Diabetes Mellitus References:Diagnosis and Classification of Diabetes Mellitus,Diabetes Care,2011,34(Suppl 1):S62-S69 and Standards of Medical Care in         Diabetes - 2011,Diabetes Care,2011,34 (Suppl 1):S11-S61.    CBG: Recent Labs  Lab 01/07/19 1619 01/07/19 2007 01/07/19 2340 01/08/19 0341 01/08/19 0829  GLUCAP 142* 142* 151* 111* 100*    Critical care time: 35 min including chart data review, examination of patient, multidisciplinary rounds, and frequent assessment and modification of ventilator settings and coordination of care.   Lynnell Catalan, MD [facility] ICU Physician [facility]  Pager: [phone] Mobile: [phone] After hours: [phone].  01/08/2019, 9:00 AM

## 2019-01-09 ENCOUNTER — Inpatient Hospital Stay (HOSPITAL_COMMUNITY): Payer: Medicare HMO

## 2019-01-09 LAB — CREATININE, SERUM
Creatinine, Ser: 0.59 mg/dL (ref 0.44–1.00)
GFR calc Af Amer: >60 mL/min (ref >60)
GFR calc non Af Amer: >60 mL/min (ref >60)

## 2019-01-09 LAB — BASIC METABOLIC PANEL
Anion gap: 9 (ref 5–15)
BUN: 19 mg/dL (ref 8–23)
CO2: 31 mmol/L (ref 22–32)
CREATININE: 0.69 mg/dL (ref 0.44–1.00)
Calcium: 9.1 mg/dL (ref 8.9–10.3)
Chloride: 102 mmol/L (ref 98–111)
GFR calc Af Amer: >60 mL/min (ref >60)
GFR calc non Af Amer: >60 mL/min (ref >60)
GLUCOSE: 178 mg/dL — AB (ref 70–99)
Potassium: 4 mmol/L (ref 3.5–5.1)
Sodium: 142 mmol/L (ref 135–145)

## 2019-01-09 LAB — GLUCOSE, CAPILLARY
GLUCOSE-CAPILLARY: 124 mg/dL — AB (ref 70–99)
GLUCOSE-CAPILLARY: 171 mg/dL — AB (ref 70–99)
Glucose-Capillary: 108 mg/dL — ABNORMAL HIGH (ref 70–99)
Glucose-Capillary: 133 mg/dL — ABNORMAL HIGH (ref 70–99)
Glucose-Capillary: 144 mg/dL — ABNORMAL HIGH (ref 70–99)
Glucose-Capillary: 199 mg/dL — ABNORMAL HIGH (ref 70–99)

## 2019-01-09 LAB — TRIGLYCERIDES: TRIGLYCERIDES: 112 mg/dL (ref <150)

## 2019-01-09 MED ORDER — POLYETHYLENE GLYCOL 3350 17 G PO PACK
PACK | ORAL | Status: AC
  Filled 2019-01-09: qty 2

## 2019-01-09 MED ORDER — PHENYLEPHRINE HCL-NACL 10-0.9 MG/250ML-% IV SOLN
INTRAVENOUS | Status: AC
  Filled 2019-01-09: qty 250

## 2019-01-09 MED ORDER — POLYETHYLENE GLYCOL 3350 17 GM/SCOOP PO POWD
1.0000 | Freq: Once | ORAL | Status: AC
  Administered 2019-01-09: 238 g
  Filled 2019-01-09: qty 255

## 2019-01-09 NOTE — Consult Note (Signed)
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 Somewhat unusual presentation as the lesion is short (2cm) and wide (8cm). Wound type: Pressure Pressure Injury POA: Yes Measurement: 2cm x 8cm x 0.1cm with peeling brown epidermis revealing a pink, moist wound bed.Wound crosses gluteal cleft and is slightly longer on right buttock Wound bed:As noted above Drainage (amount, consistency, odor) scant serous Periwound:intact, macerated (from urine).  Patient is incontinent and has an external urinary management device (PurWick), but she voids forcefully around it when coughing. Dressing procedure/placement/frequency: I will continue treatment with a silicone foam dressing.  Nursing has been consistent with turning and repositioning from side to side and with HOB elevation at or below a 30 degree angle as tolerated with ventilation and to avoid VAP. We will today add bilateral pressure redistribution heel boots for pressure injury (PI) prevention.  WOC nursing team will not follow, but will remain available to this patient, the nursing and medical teams.  Please re-consult if needed. Thanks, Ladona Mow, MSN, RN, GNP, [facility]  Pager# [phone]

## 2019-01-09 NOTE — Progress Notes (Addendum)
NAMELizann Wang, MRN:  [id number], DOB:  Aug 10, 1951, LOS: 7 ADMISSION DATE:  01/02/2019, CONSULTATION DATE:  3/5 REFERRING MD:  Dr. Jerral Ralph, CHIEF COMPLAINT:  Status epilepticus   Brief History   68 year old female admitted with AMS found to have status epilepticus refractory to the AEDs and required transfer to ICU for intubation and continuous infusion.    Past Medical History   has a past medical history of High cholesterol, Hypertension, and Stroke (HCC).   Significant Hospital Events   3/5 admit to floor, then ICU transfer.  3/6 intubated and on burst suppression per Neurology 3/8 versed stopped. Blood cultures cleared.  3/9 propofol stopped.  No further seizures 3/12 now off sedation X 72 hours no sig improvement  Consults:  Neurology  Procedures:  ETT 3/6>> RUE PICC by IR 3/11>>> Significant Diagnostic Tests:  CT head 3/5 > small vessel disease. No acute cortically based infarct or hemorrhage.  CT angio and perfusion 3/5 > negative large vessel occlusion. No core infarct or penumbra. Moderate to severe ICA stenosis. RUL pneumonia partially visible.  CT C-spine 3/5 > No cervical spine fracture or subluxation. Mild multilevel degenerative changes. EEG 3/5 >Continuous epileptiform discharge involving the right temporal region meeting the electrographic criteria for for status epilepticus CXR 01/04/19 > Patchy airspace disease on right side in RUL and RML, ETT appropriate position, interval placement of L IJ CVC MRI 01/07/19 > Chronic microvascular disease and atrophy but nil acute (personally reviewed)  Micro Data:  Blood 3/5 > staph epidermis  Blood 3/6 >neg Tracheal aspirate  3/6>NF  Antimicrobials:  Ceftriaxone 3/5  Flagyl 3/5  Unasyn 3/5 >>>6 days rx   Interim history/subjective:  No significant changes other than withdrawing to pain  Objective   Blood pressure 115/70, pulse 95, temperature 97.8 F (36.6 C), temperature source Axillary, resp. rate 15, height  5' (1.524 m), weight 69.2 kg, SpO2 99 %.    Vent Mode: PRVC FiO2 (%):  [30 %] 30 % Set Rate:  [15 bmp] 15 bmp Vt Set:  [360 mL] 360 mL PEEP:  [5 cmH20] 5 cmH20 Pressure Support:  [8 cmH20] 8 cmH20 Plateau Pressure:  [13 cmH20-16 cmH20] 13 cmH20   Intake/Output Summary (Last 24 hours) at 01/09/2019 1023 Last data filed at 01/09/2019 0700 Gross per 24 hour  Intake 938.2 ml  Output 2000 ml  Net -1061.8 ml   Filed Weights   01/05/19 0600 01/06/19 0500 01/07/19 0500  Weight: 68.4 kg 69.4 kg 69.2 kg   Physical Exam:  General: This is a female patient lying in bed she is minimally responsive, responds to noxious stimulus only with withdrawal HEENT normocephalic atraumatic no jugular venous distention orally intubated Pulmonary: Clear to auscultation tolerating pressure support ventilation Cardiac: Regular rate and rhythm Abdomen: Soft nontender no organomegaly  extremities: Warm, dry, dependent edema brisk capillary refill pulses palpable Neuro: Off sedation x72 hours.  Can elicit withdrawal pain only with possible intermittent posturing GU: Clear yellow Derm: Skin breakdown noted on sacrum   Resolved Hospital Problem list   Hypokalemia Aspiration PNA Staph epidermis bacteremia   Assessment & Plan:   Status epilepticus - no recurrent seizures and EEG still shows burst suppression.  Given accumulation of sedatives, make take several days for sensorium to clear. MRI shows nil acute, withdrawing as of 3/12 which is small improvement Plan. Continue AEDs as per neurology, currently on Vimpat, Keppra and Dilantin Serial neuro checks  Acute hypoxemic respiratory insufficiency due to inability to protect  airway On minimal vent settings. Remain intubated due to current mental status -Still no significant changes as of 3/12 Plan: Continuing ventilatory support and weaning as tolerated VAP bundle Spontaneous breathing trial if mental status improves however may need tracheostomy due  to prolonged deconditioning If no improvement by 3/14 will discuss this with family and recommend proceeding if support is desired  H/o Hypertension & Hyperlipidemia Plan Holding home diltiazem and atorvastatin Diuresis as blood pressure, BUN and creatinine tolerate  Fluid and electrolyte imbalance: Hypokalemia noted on 3/11 Plan Follow-up chemistry  Inadequate oral intake Plan Continue tube feeds  Skin breakdown.  Worsening sacral ulceration due to frequent stools Plan Wound ostomy evaluation   Best practice:  Diet: TF at goal Pain/Anxiety/Delirium protocol (if indicated): Propofol and Versed now off. VAP protocol (if indicated): Yes  DVT prophylaxis: enoxaparin  GI prophylaxis: N/a  Glucose control: N/a Mobility: BR Code Status: FULL Family Communication: No family at bedside Disposition: ICU remains critically ill due to residual metabolic and toxic encephalopathy following prolonged seizure, as well as need for ongoing mechanical ventilation.  She is not ready for extubation given ongoing altered mental status.  We will continue supportive care.  Critical care time: 32 minutes   01/09/2019, 10:23 AM     Critical care attending attestation note:  Patient seen and examined and relevant ancillary tests reviewed.  I agree with the assessment and plan of care as outlined by Tomasa Blase, NP. This patient was not seen as a shared visit. The following reflects my independent critical care time.  Remains critically ill due to ongoing ventilator dependent respiratory failure due to inability to protect airway due to encephalopathy following status epilepticus.  Has had no further seizures but did receive high doses of sedative achieve burst suppression.  On exam today there is more facial grimacing and spontaneous tongue movements with oral care and she is now withdrawing to painful stimuli. We will continue full ventilatory support in anticipation of a protracted course of  mechanical ventilation as the patient clears from the effects of the sedatives.  We will place a core track tube for feeding and are planning a tracheostomy if she is not ready to progress to SBT by Monday.  Critical care time: 35 min including chart data review, examination of patient, multidisciplinary rounds, and frequent assessment and modification of ventilator therapy.   Lynnell Catalan, MD Staff Critical Care Physician [facility] Pager: [phone] Off hours: [phone]  01/09/2019, 2:25 PM

## 2019-01-10 LAB — GLUCOSE, CAPILLARY
GLUCOSE-CAPILLARY: 209 mg/dL — AB (ref 70–99)
GLUCOSE-CAPILLARY: 123 mg/dL — AB (ref 70–99)
Glucose-Capillary: 83 mg/dL (ref 70–99)
Glucose-Capillary: 172 mg/dL — ABNORMAL HIGH (ref 70–99)
Glucose-Capillary: 107 mg/dL — ABNORMAL HIGH (ref 70–99)
Glucose-Capillary: 143 mg/dL — ABNORMAL HIGH (ref 70–99)

## 2019-01-10 LAB — COMPREHENSIVE METABOLIC PANEL
ALT: 47 U/L — ABNORMAL HIGH (ref 0–44)
AST: 50 U/L — ABNORMAL HIGH (ref 15–41)
Albumin: 2 g/dL — ABNORMAL LOW (ref 3.5–5.0)
Alkaline Phosphatase: 64 U/L (ref 38–126)
Anion gap: 10 (ref 5–15)
BUN: 30 mg/dL — ABNORMAL HIGH (ref 8–23)
CO2: 33 mmol/L — ABNORMAL HIGH (ref 22–32)
Calcium: 8.7 mg/dL — ABNORMAL LOW (ref 8.9–10.3)
Chloride: 102 mmol/L (ref 98–111)
Creatinine, Ser: 0.81 mg/dL (ref 0.44–1.00)
GFR calc Af Amer: >60 mL/min (ref >60)
GFR calc non Af Amer: >60 mL/min (ref >60)
Glucose, Bld: 178 mg/dL — ABNORMAL HIGH (ref 70–99)
Potassium: 3.2 mmol/L — ABNORMAL LOW (ref 3.5–5.1)
Sodium: 145 mmol/L (ref 135–145)
Total Bilirubin: 0.4 mg/dL (ref 0.3–1.2)
Total Protein: 6.1 g/dL — ABNORMAL LOW (ref 6.5–8.1)

## 2019-01-10 LAB — CBC
HCT: 34.3 % — ABNORMAL LOW (ref 36.0–46.0)
Hemoglobin: 10.5 g/dL — ABNORMAL LOW (ref 12.0–15.0)
MCH: 29.4 pg (ref 26.0–34.0)
MCHC: 30.6 g/dL (ref 30.0–36.0)
MCV: 96.1 fL (ref 80.0–100.0)
Platelets: 264 10*3/uL (ref 150–400)
RBC: 3.57 MIL/uL — ABNORMAL LOW (ref 3.87–5.11)
RDW: 15.5 % (ref 11.5–15.5)
WBC: 9.2 10*3/uL (ref 4.0–10.5)
nRBC: 0 % (ref 0.0–0.2)

## 2019-01-10 LAB — PHENYTOIN LEVEL, TOTAL: Phenytoin Lvl: 24.9 ug/mL — ABNORMAL HIGH (ref 10.0–20.0)

## 2019-01-10 MED ORDER — POTASSIUM CHLORIDE 20 MEQ/15ML (10%) PO SOLN
40.0000 meq | Freq: Once | ORAL | Status: AC
  Administered 2019-01-10: 40 meq
  Filled 2019-01-10: qty 30

## 2019-01-10 MED ORDER — INSULIN ASPART 100 UNIT/ML ~~LOC~~ SOLN
0.0000 [IU] | SUBCUTANEOUS | Status: DC
  Administered 2019-01-10: 2 [IU] via SUBCUTANEOUS
  Administered 2019-01-10 (×2): 3 [IU] via SUBCUTANEOUS
  Administered 2019-01-10 – 2019-01-11 (×3): 2 [IU] via SUBCUTANEOUS
  Administered 2019-01-11: 3 [IU] via SUBCUTANEOUS
  Administered 2019-01-12 – 2019-01-16 (×13): 2 [IU] via SUBCUTANEOUS
  Administered 2019-01-16: 3 [IU] via SUBCUTANEOUS
  Administered 2019-01-16 – 2019-01-19 (×7): 2 [IU] via SUBCUTANEOUS
  Administered 2019-01-19: 3 [IU] via SUBCUTANEOUS
  Administered 2019-01-19 – 2019-01-21 (×6): 2 [IU] via SUBCUTANEOUS
  Administered 2019-01-22: 3 [IU] via SUBCUTANEOUS
  Administered 2019-01-22 (×2): 2 [IU] via SUBCUTANEOUS
  Administered 2019-01-23: 3 [IU] via SUBCUTANEOUS
  Administered 2019-01-23 – 2019-01-25 (×4): 2 [IU] via SUBCUTANEOUS
  Administered 2019-01-26: 3 [IU] via SUBCUTANEOUS
  Administered 2019-01-26: 2 [IU] via SUBCUTANEOUS
  Administered 2019-01-27 (×2): 3 [IU] via SUBCUTANEOUS
  Administered 2019-01-27 – 2019-01-28 (×4): 2 [IU] via SUBCUTANEOUS
  Administered 2019-01-28: 3 [IU] via SUBCUTANEOUS
  Administered 2019-01-28 – 2019-01-31 (×10): 2 [IU] via SUBCUTANEOUS
  Administered 2019-01-31: 1 [IU] via SUBCUTANEOUS

## 2019-01-10 NOTE — Progress Notes (Signed)
MEDICATION RELATED CONSULT NOTE - INITIAL   Pharmacy Consult for phenytoin Indication: seizure-like activity  Allergies  Allergen Reactions  . Ace Inhibitors Other (See Comments)    Acute renal failure    Patient Measurements: Height: 5' (152.4 cm) Weight: 143 lb 4.8 oz (65 kg) IBW/kg (Calculated) : 45.5  Vital Signs: Temp: 98 F (36.7 C) (03/13 0800) Temp Source: Axillary (03/13 0800) BP: 132/68 (03/13 0900) Pulse Rate: 96 (03/13 0900) Intake/Output from previous day: 03/12 0701 - 03/13 0700 In: 2400.5 [I.V.:480.5; NG/GT:1920] Out: 1925 [Urine:1725; Emesis/NG output:200] Intake/Output from this shift: Total I/O In: 50.1 [I.V.:20.1; NG/GT:30] Out: -   Labs: Recent Labs    01/08/19 0822 01/09/19 0553 01/09/19 1139 01/10/19 0443  WBC 6.3  --   --  9.2  HGB 12.0  --   --  10.5*  HCT 38.2  --   --  34.3*  PLT 220  --   --  264  CREATININE 0.58 0.59 0.69 0.81  MG 1.7  --   --   --   ALBUMIN  --   --   --  2.0*  PROT  --   --   --  6.1*  AST  --   --   --  50*  ALT  --   --   --  47*  ALKPHOS  --   --   --  64  BILITOT  --   --   --  0.4   Estimated Creatinine Clearance: 58.3 mL/min (by C-G formula based on SCr of 0.81 mg/dL).   Infusions:  . sodium chloride 20 mL/hr at 01/10/19 0800  . phenylephrine      Assessment: 52 yoF admitted 3/5 unresponsive with AMS. EEG (preliminary report) was positive for seizure activity so patient initiated on fosphenytoin load followed by phenytoin maintenance dosing. Patient now s/p burst suppression.  Has been on phenytoin 100 mg q8h - was changed over to per tube 3/12  Phenytoin level this am 24.9; corrects to 38.3 based on albumin of 2  Plan:  DC phenytoin Recheck level in am  Isaac Bliss, PharmD, BCPS, BCCCP Clinical Pharmacist [phone]  Please check AMION for all [facility] numbers  01/10/2019 9:24 AM

## 2019-01-10 NOTE — Progress Notes (Signed)
NAMERejeana Wang, MRN:  [id number], DOB:  1951-08-28, LOS: 8 ADMISSION DATE:  01/02/2019, CONSULTATION DATE:  3/5 REFERRING MD:  Dr. Jerral Ralph, CHIEF COMPLAINT:  Status epilepticus   Brief History   68 year old female admitted with AMS found to have status epilepticus refractory to the AEDs and required transfer to ICU for intubation and continuous infusion.    Past Medical History   has a past medical history of High cholesterol, Hypertension, and Stroke (HCC).   Significant Hospital Events   3/5 admit to floor, then ICU transfer.  3/6 intubated and on burst suppression per Neurology 3/8 versed stopped. Blood cultures cleared.  3/9 propofol stopped.  No further seizures 3/12 now off sedation X 72 hours no sig improvement  3/13: Withdrawing to pain, otherwise no significant changes Consults:  Neurology  Procedures:  ETT 3/6>> RUE PICC by IR 3/11>>> Significant Diagnostic Tests:  CT head 3/5 > small vessel disease. No acute cortically based infarct or hemorrhage.  CT angio and perfusion 3/5 > negative large vessel occlusion. No core infarct or penumbra. Moderate to severe ICA stenosis. RUL pneumonia partially visible.  CT C-spine 3/5 > No cervical spine fracture or subluxation. Mild multilevel degenerative changes. EEG 3/5 >Continuous epileptiform discharge involving the right temporal region meeting the electrographic criteria for for status epilepticus CXR 01/04/19 > Patchy airspace disease on right side in RUL and RML, ETT appropriate position, interval placement of L IJ CVC MRI 01/07/19 > Chronic microvascular disease and atrophy but nil acute (personally reviewed)  Micro Data:  Blood 3/5 > staph epidermis  Blood 3/6 >neg Tracheal aspirate  3/6>NF  Antimicrobials:  Ceftriaxone 3/5  Flagyl 3/5  Unasyn 3/5 >>>6 days rx   Interim history/subjective:  Withdrawing to pain  Objective   Blood pressure 132/68, pulse 96, temperature 98 F (36.7 C), temperature source  Axillary, resp. rate 15, height 5' (1.524 m), weight 65 kg, SpO2 99 %.    Vent Mode: PRVC FiO2 (%):  [30 %] 30 % Set Rate:  [15 bmp] 15 bmp Vt Set:  [360 mL] 360 mL PEEP:  [5 cmH20] 5 cmH20 Plateau Pressure:  [12 cmH20-14 cmH20] 13 cmH20   Intake/Output Summary (Last 24 hours) at 01/10/2019 4098 Last data filed at 01/10/2019 0800 Gross per 24 hour  Intake 2400.49 ml  Output 1925 ml  Net 475.49 ml   Filed Weights   01/06/19 0500 01/07/19 0500 01/10/19 0442  Weight: 69.4 kg 69.2 kg 65 kg   Physical Exam:  General 68 year old female patient currently remains minimally responsive on ventilator HEENT: Normocephalic atraumatic no jugular venous distention remains orally intubated Pulmonary: Clear to auscultation equal chest rise Cardiac: Regular rate and rhythm no murmur rub or gallop Abdomen: Soft, nontender no organomegaly Extremities: Warm and dry brisk capillary refill trace dependent edema Neuro: Minimally responsive, does withdrawal to noxious stimulus only.  Pupils equal reactive does have strong cough GU: Clear yellow   Resolved Hospital Problem list   Hypokalemia Aspiration PNA Staph epidermis bacteremia   Assessment & Plan:   Status epilepticus - no recurrent seizures and EEG still shows burst suppression.  Given accumulation of sedatives, make take several days for sensorium to clear. MRI shows nil acute,  Plan. Cont AEDs per neurology Serial neuro checks  Acute hypoxemic respiratory insufficiency due to inability to protect airway On minimal vent settings. Remain intubated due to current mental status -Still no significant changes as of 3/12 Plan: Cont full vent support VAP bundle May require  trach   H/o Hypertension & Hyperlipidemia Plan Holding dilt and statin   Fluid and electrolyte imbalance: Hypokalemia Plan Replace and recheck   Inadequate oral intake Plan Cont tubefeeds.   Skin breakdown.  Worsening sacral ulceration due to frequent stools  Plan rx per wound ostomy     Best practice:  Diet: TF at goal Pain/Anxiety/Delirium protocol (if indicated): Propofol and Versed now off. VAP protocol (if indicated): Yes  DVT prophylaxis: enoxaparin  GI prophylaxis: N/a  Glucose control: N/a Mobility: BR Code Status: FULL Family Communication: No family at bedside Disposition: She remains critically ill due to need for mechanical ventilation and ongoing neurological support.  At this point she is not ready for extubation due to altered mental status and inability to protect airway.  Will continue supportive care as she has had no structural injury by MRI or hopeful this will just take time for medications to wear out.  Of note her phenytoin level is quite high, this could be contributing  Critical care time: 31 minutes   01/10/2019, 9:27 AM     Critical care attending attestation note:   01/10/2019, 9:27 AM

## 2019-01-10 NOTE — Progress Notes (Addendum)
Subjective: Patient still not awakening. Will yawn occasionally.   Objective: Current vital signs: BP (!) 163/91   Pulse 99   Temp 98.2 F (36.8 C) (Axillary)   Resp 15   Ht 5' (1.524 m)   Wt 65 kg   SpO2 99%   BMI 27.99 kg/m  Vital signs in last 24 hours: Temp:  [97.8 F (36.6 C)-98.4 F (36.9 C)] 98.2 F (36.8 C) (03/13 1537) Pulse Rate:  [93-111] 99 (03/13 1800) Resp:  [15-19] 15 (03/13 1800) BP: (57-163)/(43-91) 163/91 (03/13 1800) SpO2:  [91 %-100 %] 99 % (03/13 1800) FiO2 (%):  [30 %] 30 % (03/13 1600) Weight:  [65 kg] 65 kg (03/13 0442)  Intake/Output from previous day: 03/12 0701 - 03/13 0700 In: 2400.5 [I.V.:480.5; NG/GT:1920] Out: 1925 [Urine:1725; Emesis/NG output:200] Intake/Output this shift: Total I/O In: 750.2 [I.V.:220.2; NG/GT:530] Out: 1125 [Urine:1125] Nutritional status:  Diet Order            Diet NPO time specified  Diet effective now              Neurologic Exam: Ment: Eyes closed. Will not open to stimulation. Not following commands or attempting to communicate. No purposeful movement.  CN: PERRL. No blink to threat. Weak doll's eye reflex. No nystagmus or forced gaze deviation. Corneals intact. Face symmetric. Occasional yawning noted.   Motor: All 4 extremities with flaccid tone. No movement of upper ext to noxious. Weak dorsiflexion of feet to plantar stimulation. No purposeful movements.  Sensory: As above Reflexes: 3+ bilateral brachioradialis. 2+ patellae bilaterally..   Lab Results: Results for orders placed or performed during the hospital encounter of 01/02/19 (from the past 48 hour(s))  Glucose, capillary     Status: Abnormal   Collection Time: 01/08/19  7:36 PM  Result Value Ref Range   Glucose-Capillary 122 (H) 70 - 99 mg/dL  Glucose, capillary     Status: Abnormal   Collection Time: 01/08/19 11:17 PM  Result Value Ref Range   Glucose-Capillary 163 (H) 70 - 99 mg/dL  Glucose, capillary     Status: Abnormal   Collection  Time: 01/09/19  3:10 AM  Result Value Ref Range   Glucose-Capillary 124 (H) 70 - 99 mg/dL  Creatinine, serum     Status: None   Collection Time: 01/09/19  5:53 AM  Result Value Ref Range   Creatinine, Ser 0.59 0.44 - 1.00 mg/dL   GFR calc non Af Amer >60 >60 mL/min   GFR calc Af Amer >60 >60 mL/min    Comment: Performed at [facility], [place] [address], [place], [place] [address]  Triglycerides     Status: None   Collection Time: 01/09/19  5:53 AM  Result Value Ref Range   Triglycerides 112 <150 mg/dL    Comment: Performed at [place] [address] [place], [place] [address]  Glucose, capillary     Status: Abnormal   Collection Time: 01/09/19  8:23 AM  Result Value Ref Range   Glucose-Capillary 108 (H) 70 - 99 mg/dL   Comment 1 Notify RN    Comment 2 Document in Chart   Basic metabolic panel     Status: Abnormal   Collection Time: 01/09/19 11:39 AM  Result Value Ref Range   Sodium 142 135 - 145 mmol/L   Potassium 4.0 3.5 - 5.1 mmol/L   Chloride 102 98 - 111 mmol/L   CO2 31 22 - 32 mmol/L   Glucose, Bld 178 (H) 70 -  99 mg/dL   BUN 19 8 - 23 mg/dL   Creatinine, Ser 0.69 0.44 - 1.00 mg/dL   Calcium 9.1 8.9 - 10.3 mg/dL   GFR calc non Af Amer >60 >60 mL/min   GFR calc Af Amer >60 >60 mL/min   Anion gap 9 5 - 15    Comment: Performed at [facility] [address] [place], [place] [address]  Glucose, capillary     Status: Abnormal   Collection Time: 01/09/19 12:06 PM  Result Value Ref Range   Glucose-Capillary 144 (H) 70 - 99 mg/dL   Comment 1 Notify RN    Comment 2 Document in Chart   Glucose, capillary     Status: Abnormal   Collection Time: 01/09/19  4:04 PM  Result Value Ref Range   Glucose-Capillary 133 (H) 70 - 99 mg/dL   Comment 1 Notify RN    Comment 2 Document in Chart   Glucose, capillary     Status: Abnormal   Collection Time: 01/09/19  7:45 PM  Result Value Ref Range   Glucose-Capillary 171 (H) 70 - 99 mg/dL  Glucose, capillary      Status: Abnormal   Collection Time: 01/09/19 11:36 PM  Result Value Ref Range   Glucose-Capillary 199 (H) 70 - 99 mg/dL  Glucose, capillary     Status: Abnormal   Collection Time: 01/10/19  3:56 AM  Result Value Ref Range   Glucose-Capillary 209 (H) 70 - 99 mg/dL  CBC     Status: Abnormal   Collection Time: 01/10/19  4:43 AM  Result Value Ref Range   WBC 9.2 4.0 - 10.5 K/uL   RBC 3.57 (L) 3.87 - 5.11 MIL/uL   Hemoglobin 10.5 (L) 12.0 - 15.0 g/dL   HCT 34.3 (L) 36.0 - 46.0 %   MCV 96.1 80.0 - 100.0 fL   MCH 29.4 26.0 - 34.0 pg   MCHC 30.6 30.0 - 36.0 g/dL   RDW 15.5 11.5 - 15.5 %   Platelets 264 150 - 400 K/uL   nRBC 0.0 0.0 - 0.2 %    Comment: Performed at [facility], [place]. [address] [place], [place] [address]  Comprehensive metabolic panel     Status: Abnormal   Collection Time: 01/10/19  4:43 AM  Result Value Ref Range   Sodium 145 135 - 145 mmol/L   Potassium 3.2 (L) 3.5 - 5.1 mmol/L   Chloride 102 98 - 111 mmol/L   CO2 33 (H) 22 - 32 mmol/L   Glucose, Bld 178 (H) 70 - 99 mg/dL   BUN 30 (H) 8 - 23 mg/dL   Creatinine, Ser 0.81 0.44 - 1.00 mg/dL   Calcium 8.7 (L) 8.9 - 10.3 mg/dL   Total Protein 6.1 (L) 6.5 - 8.1 g/dL   Albumin 2.0 (L) 3.5 - 5.0 g/dL   AST 50 (H) 15 - 41 U/L   ALT 47 (H) 0 - 44 U/L   Alkaline Phosphatase 64 38 - 126 U/L   Total Bilirubin 0.4 0.3 - 1.2 mg/dL   GFR calc non Af Amer >60 >60 mL/min   GFR calc Af Amer >60 >60 mL/min   Anion gap 10 5 - 15    Comment: Performed at [facility], [place] [address], [place], [place] [address]  Phenytoin level, total     Status: Abnormal   Collection Time: 01/10/19  4:43 AM  Result Value Ref Range   Phenytoin Lvl 24.9 (H) 10.0 -  20.0 ug/mL    Comment: Performed at [facility], [place] [address] [place], [place] [address]  Glucose, capillary     Status: None   Collection Time: 01/10/19  8:06 AM  Result Value Ref Range   Glucose-Capillary 83 70 - 99 mg/dL  Glucose, capillary     Status:  Abnormal   Collection Time: 01/10/19 11:48 AM  Result Value Ref Range   Glucose-Capillary 172 (H) 70 - 99 mg/dL  Glucose, capillary     Status: Abnormal   Collection Time: 01/10/19  3:46 PM  Result Value Ref Range   Glucose-Capillary 123 (H) 70 - 99 mg/dL    Recent Results (from the past 240 hour(s))  Blood Culture (routine x 2)     Status: Abnormal   Collection Time: 01/02/19 11:58 AM  Result Value Ref Range Status   Specimen Description BLOOD LEFT ANTECUBITAL  Final   Special Requests   Final    BOTTLES DRAWN AEROBIC AND ANAEROBIC Blood Culture adequate volume   Culture  Setup Time   Final    GRAM POSITIVE COCCI IN CLUSTERS IN BOTH AEROBIC AND ANAEROBIC BOTTLES CRITICAL RESULT CALLED TO, READ BACK BY AND VERIFIED WITH: Olean RN AT [place] [id number] BY SJW Performed at [facility], [place] [address] [place] [address] [place], [place] [address]    Culture STAPHYLOCOCCUS EPIDERMIDIS (A)  Final   Report Status 01/05/2019 FINAL  Final   Organism ID, Bacteria STAPHYLOCOCCUS EPIDERMIDIS  Final      Susceptibility   Staphylococcus epidermidis - MIC*    CIPROFLOXACIN <=0.5 SENSITIVE Sensitive     ERYTHROMYCIN <=0.25 SENSITIVE Sensitive     GENTAMICIN <=0.5 SENSITIVE Sensitive     OXACILLIN <=0.25 SENSITIVE Sensitive     TETRACYCLINE >=16 RESISTANT Resistant     VANCOMYCIN 1 SENSITIVE Sensitive     TRIMETH/SULFA <=10 SENSITIVE Sensitive     CLINDAMYCIN <=0.25 SENSITIVE Sensitive     RIFAMPIN <=0.5 SENSITIVE Sensitive     Inducible Clindamycin NEGATIVE Sensitive     * STAPHYLOCOCCUS EPIDERMIDIS  Blood Culture ID Panel (Reflexed)     Status: Abnormal   Collection Time: 01/02/19 11:58 AM  Result Value Ref Range Status   Enterococcus species NOT DETECTED NOT DETECTED Final   Listeria monocytogenes NOT DETECTED NOT DETECTED Final   Staphylococcus species DETECTED (A) NOT DETECTED Final    Comment: Methicillin (oxacillin) susceptible coagulase negative staphylococcus. Possible blood culture contaminant  (unless isolated from more than one blood culture draw or clinical case suggests pathogenicity). No antibiotic treatment is indicated for blood  culture contaminants. CRITICAL RESULT CALLED TO, READ BACK BY AND VERIFIED WITH: BRYK RN AT 0715 ON [id number] BY SJW    Staphylococcus aureus (BCID) NOT DETECTED NOT DETECTED Final   Methicillin resistance NOT DETECTED NOT DETECTED Final   Streptococcus species NOT DETECTED NOT DETECTED Final   Streptococcus agalactiae NOT DETECTED NOT DETECTED Final   Streptococcus pneumoniae NOT DETECTED NOT DETECTED Final   Streptococcus pyogenes NOT DETECTED NOT DETECTED Final   Acinetobacter baumannii NOT DETECTED NOT DETECTED Final   Enterobacteriaceae species NOT DETECTED NOT DETECTED Final   Enterobacter cloacae complex NOT DETECTED NOT DETECTED Final   Escherichia coli NOT DETECTED NOT DETECTED Final   Klebsiella oxytoca NOT DETECTED NOT DETECTED Final   Klebsiella pneumoniae NOT DETECTED NOT DETECTED Final   Proteus species NOT DETECTED NOT DETECTED Final   Serratia marcescens NOT DETECTED NOT DETECTED Final   Haemophilus influenzae NOT DETECTED NOT DETECTED Final  Neisseria meningitidis NOT DETECTED NOT DETECTED Final   Pseudomonas aeruginosa NOT DETECTED NOT DETECTED Final   Candida albicans NOT DETECTED NOT DETECTED Final   Candida glabrata NOT DETECTED NOT DETECTED Final   Candida krusei NOT DETECTED NOT DETECTED Final   Candida parapsilosis NOT DETECTED NOT DETECTED Final   Candida tropicalis NOT DETECTED NOT DETECTED Final    Comment: Performed at [facility], [place] [address], [place], [place] [address]  Blood Culture (routine x 2)     Status: Abnormal   Collection Time: 01/02/19 12:03 PM  Result Value Ref Range Status   Specimen Description BLOOD SITE NOT SPECIFIED  Final   Special Requests   Final    BOTTLES DRAWN AEROBIC AND ANAEROBIC Blood Culture adequate volume   Culture  Setup Time   Final    GRAM POSITIVE COCCI IN BOTH AEROBIC  AND ANAEROBIC BOTTLES CRITICAL VALUE NOTED.  VALUE IS CONSISTENT WITH PREVIOUSLY REPORTED AND CALLED VALUE.    Culture (A)  Final    STAPHYLOCOCCUS EPIDERMIDIS SUSCEPTIBILITIES PERFORMED ON PREVIOUS CULTURE WITHIN THE LAST 5 DAYS. Performed at [facility], [place] [address] [place], [place] [address]    Report Status 01/05/2019 FINAL  Final  MRSA PCR Screening     Status: None   Collection Time: 01/03/19  1:38 AM  Result Value Ref Range Status   MRSA by PCR NEGATIVE NEGATIVE Final    Comment:        The GeneXpert MRSA Assay (FDA approved for NASAL specimens only), is one component of a comprehensive MRSA colonization surveillance program. It is not intended to diagnose MRSA infection nor to guide or monitor treatment for MRSA infections. Performed at [facility], [place] [address] [place], [place] [address]   Culture, blood (routine x 2)     Status: None   Collection Time: 01/03/19 10:15 AM  Result Value Ref Range Status   Specimen Description BLOOD LEFT HAND  Final   Special Requests AEROBIC BOTTLE ONLY Blood Culture adequate volume  Final   Culture   Final    NO GROWTH 5 DAYS Performed at [facility], [place] [address], [place], [place] [address]    Report Status 01/08/2019 FINAL  Final  Culture, blood (routine x 2)     Status: None   Collection Time: 01/03/19 10:21 AM  Result Value Ref Range Status   Specimen Description BLOOD LEFT ANTECUBITAL  Final   Special Requests   Final    BOTTLES DRAWN AEROBIC AND ANAEROBIC Blood Culture adequate volume   Culture   Final    NO GROWTH 5 DAYS Performed at [facility], [place] [address], [place], [place] [address]    Report Status 01/08/2019 FINAL  Final  Culture, respiratory (non-expectorated)     Status: None   Collection Time: 01/03/19  5:25 PM  Result Value Ref Range Status   Specimen Description TRACHEAL ASPIRATE  Final   Special Requests NONE  Final   Gram Stain   Final    ABUNDANT WBC PRESENT,  PREDOMINANTLY MONONUCLEAR NO ORGANISMS SEEN    Culture   Final    RARE Consistent with normal respiratory flora. Performed at [facility], [place] [address] [place], [place] [address]    Report Status 01/06/2019 FINAL  Final    Lipid Panel Recent Labs    01/09/19 0553  TRIG 112    Studies/Results: Dg Abd Portable 1v  Result Date: 01/09/2019 CLINICAL DATA:  Orogastric tube placement. EXAM: PORTABLE ABDOMEN - 1 VIEW COMPARISON:  None. FINDINGS: Enteric catheter descends below the left hemidiaphragm, however descends steeply caudally in a course which is not typical for intragastric location. The bowel gas pattern is normal. No radio-opaque calculi or other significant radiographic abnormality are seen. IMPRESSION: Enteric catheter descends below the left hemidiaphragm in a course which is not typical for intragastric location. This may represent an unusual anatomic location of the stomach or an extraluminal location of the catheter. If no prior imaging is available for comparison, CT of the abdomen may be considered for position confirmation. Electronically Signed   By: Fidela Salisbury M.D.   On: 01/09/2019 10:58    Medications:  Scheduled: . chlorhexidine gluconate (MEDLINE KIT)  15 mL Mouth Rinse BID  . enoxaparin (LOVENOX) injection  40 mg Subcutaneous Q24H  . feeding supplement (PRO-STAT SUGAR FREE 64)  30 mL Per Tube TID  . feeding supplement (VITAL AF 1.2 CAL)  1,000 mL Per Tube Q24H  . insulin aspart  0-15 Units Subcutaneous Q4H  . lacosamide  200 mg Per Tube BID  . levETIRAcetam  1,500 mg Per Tube BID  . mouth rinse  15 mL Mouth Rinse 10 times per day  . multivitamin  15 mL Per Tube Daily  . pantoprazole sodium  40 mg Per Tube QHS   Continuous: . sodium chloride 20 mL/hr at 01/10/19 1800    Assessment:30 year oldfemale with past medical history of stroke in 2014who presents to the emergency room after being founddown.Last seen normal was 2 days PTAby her  family. STATEEG showed right hemispheric seizures. Patient was loaded with Keppra and then fosphenytoin;however, shecontinued to seize and wasintubated for airway protection and burst suppression. Vimpat wasadded.Wasin burst suppression for 48 hours (starting on 3/6) prior to being fully weaned off propofolon Monday. Now > 72 hours off all sedation, with no clinical seizure activity.   1.Status epilepticus, resolved. EEG from Tuesday showed evidence of a severe diffuse encephalopathy; no epileptiform discharges or EEG seizures were recorded. 2. Most likely component of DDx is nowdiffuse anoxic injury given that she was found down after last being seen normal 2 days previously. Although her MRI brain is negative for anoxic brain injury findings, MRI imaging is often unremarkable in cases where the injury is moderate or severe but not necessarily catastrophic.  3. Of note, there is moderate to severe atrophy on her MRI, including severe bilateral hippocampal atrophy, consistent with decreased neurological reserve. This likely portends a poorer long term outcome relative to patients with a similar clinical picture but without advanced cerebral atrophy. 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  4. On Vimpat200 BID, Keppra1500 BIDand Dilantin 100 TID. Dilantin level today was supratherapeutic.  Marland Kitchen  Recommendations 1.Continue Vimpat,Keppra and Dilantin. Pharmacy is dosing Dilantin, which will need to be held until serum level goes back down into the therapeutic range.  2.Continue serial neurochecks 3. Prognosis for a meaningful neurological recovery is now felt more likely to be poor, given the atrophy on MRI, burst suppression pattern off all sedation > 24 hours on most recent EEG and clinical history most consistent with anoxic brain injury.  4. CCM feels that she needs more time to wash sedating meds  out of her system. They plan on considering tracheostomy on Monday if family is amenable. Would also discuss long-term goals of care with family on  Monday.   35 minutes spent in the neurological evaluation and management of this critically ill patient.    LOS: 8 days   _0  signed: Dr. Kerney Elbe 01/10/2019  6:34 PM

## 2019-01-11 LAB — COMPREHENSIVE METABOLIC PANEL
ALBUMIN: 2.2 g/dL — AB (ref 3.5–5.0)
ALT: 77 U/L — ABNORMAL HIGH (ref 0–44)
AST: 78 U/L — ABNORMAL HIGH (ref 15–41)
Alkaline Phosphatase: 65 U/L (ref 38–126)
Anion gap: 10 (ref 5–15)
BUN: 35 mg/dL — ABNORMAL HIGH (ref 8–23)
CO2: 32 mmol/L (ref 22–32)
Calcium: 9.5 mg/dL (ref 8.9–10.3)
Chloride: 105 mmol/L (ref 98–111)
Creatinine, Ser: 0.78 mg/dL (ref 0.44–1.00)
GFR calc Af Amer: >60 mL/min (ref >60)
GFR calc non Af Amer: >60 mL/min (ref >60)
Glucose, Bld: 126 mg/dL — ABNORMAL HIGH (ref 70–99)
POTASSIUM: 3.6 mmol/L (ref 3.5–5.1)
Sodium: 147 mmol/L — ABNORMAL HIGH (ref 135–145)
TOTAL PROTEIN: 6.3 g/dL — AB (ref 6.5–8.1)
Total Bilirubin: 0.4 mg/dL (ref 0.3–1.2)

## 2019-01-11 LAB — CBC
HEMATOCRIT: 34.3 % — AB (ref 36.0–46.0)
Hemoglobin: 10.3 g/dL — ABNORMAL LOW (ref 12.0–15.0)
MCH: 29.5 pg (ref 26.0–34.0)
MCHC: 30 g/dL (ref 30.0–36.0)
MCV: 98.3 fL (ref 80.0–100.0)
Platelets: 255 10*3/uL (ref 150–400)
RBC: 3.49 MIL/uL — ABNORMAL LOW (ref 3.87–5.11)
RDW: 15.8 % — ABNORMAL HIGH (ref 11.5–15.5)
WBC: 6.3 10*3/uL (ref 4.0–10.5)
nRBC: 0 % (ref 0.0–0.2)

## 2019-01-11 LAB — GLUCOSE, CAPILLARY
Glucose-Capillary: 95 mg/dL (ref 70–99)
Glucose-Capillary: 135 mg/dL — ABNORMAL HIGH (ref 70–99)
Glucose-Capillary: 159 mg/dL — ABNORMAL HIGH (ref 70–99)
Glucose-Capillary: 125 mg/dL — ABNORMAL HIGH (ref 70–99)

## 2019-01-11 LAB — PHENYTOIN LEVEL, TOTAL: Phenytoin Lvl: 22.2 ug/mL — ABNORMAL HIGH (ref 10.0–20.0)

## 2019-01-11 MED ORDER — FREE WATER
200.0000 mL | Freq: Four times a day (QID) | Status: DC
  Administered 2019-01-11 – 2019-01-25 (×54): 200 mL

## 2019-01-11 NOTE — Progress Notes (Signed)
NAMEAnyssia Wang, MRN:  [id number], DOB:  01-12-1951, LOS: 9 ADMISSION DATE:  01/02/2019, CONSULTATION DATE:  3/5 REFERRING MD:  Dr. Jerral Ralph, CHIEF COMPLAINT:  Status epilepticus   Brief History   68 year old female admitted with AMS found to have status epilepticus refractory to the AEDs and required transfer to ICU for intubation and continuous infusion.    Past Medical History   has a past medical history of High cholesterol, Hypertension, and Stroke (HCC).   Significant Hospital Events   3/5 admit to floor, then ICU transfer.  3/6 intubated and on burst suppression per Neurology 3/8 versed stopped. Blood cultures cleared.  3/9 propofol stopped.  No further seizures 3/12 now off sedation X 72 hours no sig improvement  3/13: Withdrawing to pain, otherwise no significant changes 3/14: Following commands intermittently Consults:  Neurology  Procedures:  ETT 3/6>> RUE PICC by IR 3/11>>> Significant Diagnostic Tests:  CT head 3/5 > small vessel disease. No acute cortically based infarct or hemorrhage.  CT angio and perfusion 3/5 > negative large vessel occlusion. No core infarct or penumbra. Moderate to severe ICA stenosis. RUL pneumonia partially visible.  CT C-spine 3/5 > No cervical spine fracture or subluxation. Mild multilevel degenerative changes. EEG 3/5 >Continuous epileptiform discharge involving the right temporal region meeting the electrographic criteria for for status epilepticus CXR 01/04/19 > Patchy airspace disease on right side in RUL and RML, ETT appropriate position, interval placement of L IJ CVC MRI 01/07/19 > Chronic microvascular disease and atrophy but nil acute (personally reviewed)  Micro Data:  Blood 3/5 > staph epidermis  Blood 3/6 >neg Tracheal aspirate  3/6>NF  Antimicrobials:  Ceftriaxone 3/5  Flagyl 3/5  Unasyn 3/5 >>>6 days rx   Interim history/subjective:  Following commands today  Objective   Blood pressure 120/69, pulse 96,  temperature 97.6 F (36.4 C), temperature source Oral, resp. rate 14, height 5' (1.524 m), weight 56 kg, SpO2 100 %.    Vent Mode: PRVC FiO2 (%):  [30 %] 30 % Set Rate:  [15 bmp] 15 bmp Vt Set:  [360 mL] 360 mL PEEP:  [5 cmH20] 5 cmH20 Plateau Pressure:  [9 cmH20-14 cmH20] 14 cmH20   Intake/Output Summary (Last 24 hours) at 01/11/2019 0843 Last data filed at 01/11/2019 0700 Gross per 24 hour  Intake 1346.53 ml  Output 1850 ml  Net -503.47 ml   Filed Weights   01/07/19 0500 01/10/19 0442 01/11/19 0500  Weight: 69.2 kg 65 kg 56 kg   Physical Exam: General: 68 year old female patient currently remains ventilator dependent. Neuro: Actually opening eyes today intermittently following commands today.  This is a remarkable improvement compared to last 3 to 4 days HEENT: Normocephalic atraumatic no jugular venous distention orally intubated Pulmonary: Clear to auscultation Cardiac: Regular rate and rhythm Abdomen: Soft nontender Extremities: Warm and dry GU: Clear yellow   Resolved Hospital Problem list   Hypokalemia Aspiration PNA Staph epidermis bacteremia   Assessment & Plan:   Status epilepticus - no recurrent seizures and EEG still shows burst suppression.  Given accumulation of sedatives, make take several days for sensorium to clear. MRI shows nil acute,  Following commands intermittently as of 3/14 Plan. Continue AEDs per neurology Continue supportive care Continue serial neuro checks  Acute hypoxemic respiratory insufficiency due to inability to protect airway Plan: Continue full ventilator support PAD protocol RASS goal 0 VAP bundle Daily assessment for spontaneous breathing trial  H/o Hypertension & Hyperlipidemia Plan Wean diltiazem and statin  Fluid and electrolyte imbalance: Hypernatremia Plan Adding free water A.m. chemistry  Inadequate oral intake Plan Continue tube feeds  Skin breakdown.  Worsening sacral ulceration due to frequent stools  Plan Continue routine wound care    Best practice:  Diet: TF at goal Pain/Anxiety/Delirium protocol (if indicated): Propofol and Versed now off. VAP protocol (if indicated): Yes  DVT prophylaxis: enoxaparin  GI prophylaxis: N/a  Glucose control: N/a Mobility: BR Code Status: FULL Family Communication: No family at bedside Disposition: Mains critically ill due to her acute respiratory failure and ventilator dependence/airway dependence.  Today she is actually following commands which is a significant change.  We will attempt spontaneous breathing trials.  I wonder with her phenytoin level being so high how much this was complicating her mental status Critical care time: 32   01/11/2019, 8:43 AM     01/11/2019, 8:43 AM

## 2019-01-11 NOTE — Progress Notes (Signed)
MEDICATION RELATED CONSULT NOTE - INITIAL   Pharmacy Consult for phenytoin Indication: seizure-like activity  Allergies  Allergen Reactions  . Ace Inhibitors Other (See Comments)    Acute renal failure    Patient Measurements: Height: 5' (152.4 cm) Weight: 123 lb 7.3 oz (56 kg) IBW/kg (Calculated) : 45.5  Vital Signs: Temp: 97.8 F (36.6 C) (03/14 1200) Temp Source: Axillary (03/14 1200) BP: 148/73 (03/14 1200) Pulse Rate: 89 (03/14 1200) Intake/Output from previous day: 03/13 0701 - 03/14 0700 In: 1396.6 [I.V.:476.6; NG/GT:920] Out: 1850 [Urine:1850] Intake/Output from this shift: No intake/output data recorded.  Labs: Recent Labs    01/09/19 1139 01/10/19 0443 01/11/19 0425  WBC  --  9.2 6.3  HGB  --  10.5* 10.3*  HCT  --  34.3* 34.3*  PLT  --  264 255  CREATININE 0.69 0.81 0.78  ALBUMIN  --  2.0* 2.2*  PROT  --  6.1* 6.3*  AST  --  50* 78*  ALT  --  47* 77*  ALKPHOS  --  64 65  BILITOT  --  0.4 0.4   Estimated Creatinine Clearance: 55 mL/min (by C-G formula based on SCr of 0.78 mg/dL).   Infusions:  . sodium chloride 20 mL/hr at 01/11/19 0700    Assessment: 67 yoF admitted 3/5 unresponsive with AMS. EEG (preliminary report) was positive for seizure activity so patient initiated on fosphenytoin load followed by phenytoin maintenance dosing. Patient now s/p burst suppression.  Has been on phenytoin 100 mg q8h - was changed over to per tube 3/12, d/c'd on 3/13  Phenytoin level this am 22.2; corrects to 31.5 based on albumin of 2.2  Plan:  DC phenytoin Recheck level in am  Jeanella Cara, PharmD, [facility] Clinical Pharmacist Please see AMION for all Pharmacists' Contact Phone Numbers 01/11/2019, 1:52 PM

## 2019-01-12 LAB — COMPREHENSIVE METABOLIC PANEL
ALT: 58 U/L — AB (ref 0–44)
AST: 37 U/L (ref 15–41)
Albumin: 2.3 g/dL — ABNORMAL LOW (ref 3.5–5.0)
Alkaline Phosphatase: 69 U/L (ref 38–126)
Anion gap: 7 (ref 5–15)
BUN: 26 mg/dL — ABNORMAL HIGH (ref 8–23)
CO2: 30 mmol/L (ref 22–32)
CREATININE: 0.63 mg/dL (ref 0.44–1.00)
Calcium: 9 mg/dL (ref 8.9–10.3)
Chloride: 106 mmol/L (ref 98–111)
GFR calc Af Amer: >60 mL/min (ref >60)
GFR calc non Af Amer: >60 mL/min (ref >60)
Glucose, Bld: 114 mg/dL — ABNORMAL HIGH (ref 70–99)
Potassium: 3.4 mmol/L — ABNORMAL LOW (ref 3.5–5.1)
Sodium: 143 mmol/L (ref 135–145)
Total Bilirubin: 0.4 mg/dL (ref 0.3–1.2)
Total Protein: 6.5 g/dL (ref 6.5–8.1)

## 2019-01-12 LAB — GLUCOSE, CAPILLARY
Glucose-Capillary: 117 mg/dL — ABNORMAL HIGH (ref 70–99)
Glucose-Capillary: 123 mg/dL — ABNORMAL HIGH (ref 70–99)
Glucose-Capillary: 128 mg/dL — ABNORMAL HIGH (ref 70–99)
Glucose-Capillary: 134 mg/dL — ABNORMAL HIGH (ref 70–99)
Glucose-Capillary: 146 mg/dL — ABNORMAL HIGH (ref 70–99)
Glucose-Capillary: 75 mg/dL (ref 70–99)

## 2019-01-12 LAB — PHENYTOIN LEVEL, TOTAL: Phenytoin Lvl: 19.2 ug/mL (ref 10.0–20.0)

## 2019-01-12 MED ORDER — POTASSIUM CHLORIDE 20 MEQ/15ML (10%) PO SOLN
40.0000 meq | Freq: Once | ORAL | Status: AC
  Administered 2019-01-12: 40 meq
  Filled 2019-01-12: qty 30

## 2019-01-12 MED ORDER — DILTIAZEM 12 MG/ML ORAL SUSPENSION
60.0000 mg | Freq: Four times a day (QID) | ORAL | Status: DC
  Administered 2019-01-12 – 2019-01-31 (×71): 60 mg
  Filled 2019-01-12 (×82): qty 6

## 2019-01-12 NOTE — Progress Notes (Signed)
NAMEKande Wang, MRN:  [id number], DOB:  05/29/1951, LOS: 10 ADMISSION DATE:  01/02/2019, CONSULTATION DATE:  3/5 REFERRING MD:  Dr. Jerral Ralph, CHIEF COMPLAINT:  Status epilepticus   Brief History   68 year old female admitted with AMS found to have status epilepticus refractory to the AEDs and required transfer to ICU for intubation and continuous infusion.    Past Medical History   has a past medical history of High cholesterol, Hypertension, and Stroke (HCC).   Significant Hospital Events   3/5 admit to floor, then ICU transfer.  3/6 intubated and on burst suppression per Neurology 3/8 versed stopped. Blood cultures cleared.  3/9 propofol stopped.  No further seizures 3/12 now off sedation X 72 hours no sig improvement  3/13: Withdrawing to pain, otherwise no significant changes 3/14: Following commands intermittently 3/15: Much more awake, following commands still.  Opening eyes to request.  Starting pressure support trials Consults:  Neurology  Procedures:  ETT 3/6>> RUE PICC by IR 3/11>>> Significant Diagnostic Tests:  CT head 3/5 > small vessel disease. No acute cortically based infarct or hemorrhage.  CT angio and perfusion 3/5 > negative large vessel occlusion. No core infarct or penumbra. Moderate to severe ICA stenosis. RUL pneumonia partially visible.  CT C-spine 3/5 > No cervical spine fracture or subluxation. Mild multilevel degenerative changes. EEG 3/5 >Continuous epileptiform discharge involving the right temporal region meeting the electrographic criteria for for status epilepticus CXR 01/04/19 > Patchy airspace disease on right side in RUL and RML, ETT appropriate position, interval placement of L IJ CVC MRI 01/07/19 > Chronic microvascular disease and atrophy but nil acute (personally reviewed)  Micro Data:  Blood 3/5 > staph epidermis  Blood 3/6 >neg Tracheal aspirate  3/6>NF  Antimicrobials:  Ceftriaxone 3/5  Flagyl 3/5  Unasyn 3/5 >>>6 days rx    Interim history/subjective:  She continues to improve  Objective   Blood pressure 121/66, pulse 99, temperature 98.6 F (37 C), temperature source Axillary, resp. rate 13, height 5' (1.524 m), weight 55.1 kg, SpO2 98 %.    Vent Mode: CPAP;PSV FiO2 (%):  [30 %] 30 % Set Rate:  [15 bmp] 15 bmp Vt Set:  [360 mL] 360 mL PEEP:  [5 cmH20] 5 cmH20 Pressure Support:  [10 cmH20] 10 cmH20 Plateau Pressure:  [12 cmH20-15 cmH20] 13 cmH20   Intake/Output Summary (Last 24 hours) at 01/12/2019 1007 Last data filed at 01/12/2019 0900 Gross per 24 hour  Intake 1207.59 ml  Output 1450 ml  Net -242.41 ml   Filed Weights   01/10/19 0442 01/11/19 0500 01/12/19 0500  Weight: 65 kg 56 kg 55.1 kg   Physical Exam:  General: Continues to improve.  Much more responsive today. HEENT normocephalic atraumatic orally intubated Pulmonary: Clear to auscultation no accessory use Cardiac: Regular rate and rhythm Abdomen: Soft not tender Extremities: Warm and dry GU: Clear yellow Neuro: More awake, opens eyes to request, no focal deficits, continues to improve daily.  Following commands today   Resolved Hospital Problem list   Hypokalemia Aspiration PNA Staph epidermis bacteremia   Assessment & Plan:   Status epilepticus - no recurrent seizures and EEG still shows burst suppression.  Given accumulation of sedatives, make take several days for sensorium to clear. MRI shows nil acute,  Following commands intermittently as of 3/14 Plan. Continuing AEDs Supportive care Serial neuro checks  Acute hypoxemic respiratory insufficiency due to inability to protect airway Plan: Pressure support ventilation as tolerated PAD protocol RASS  goal 0 Daily assessment for extubation  H/o Hypertension & Hyperlipidemia Plan We will resume home Diltiazem (but q 6 dosing)   Fluid and electrolyte imbalance: Hypernatremia, this is improved.  Hypokalemia Plan Continue current free water replacement potassium   Inadequate oral intake Plan Continue tube feeds  Skin breakdown.  Worsening sacral ulceration due to frequent stools Plan Continue routine wound care    Best practice:  Diet: TF at goal Pain/Anxiety/Delirium protocol (if indicated): Propofol and Versed now off. VAP protocol (if indicated): Yes  DVT prophylaxis: enoxaparin  GI prophylaxis: N/a  Glucose control: N/a Mobility: BR Code Status: FULL Family Communication: No family at bedside Disposition: She is still critically ill due to her need for mechanical ventilation however she continues to improve from mental status.  Her Dilantin levels are continuing to decline.  Wonder how much of this was Dilantin toxicity as well as she spiked to 24.9 and this does not account for her low albumin levels.  We will continue the current therapy and supportive measures.  I am hopeful we will be able to extubate her in the next 48 hours or so   Critical care time: 33   01/12/2019, 10:07 AM    Simonne Martinet ACNP-BC [facility] Pulmonary/Critical Care Pager # [phone] OR # [phone] if no answer  01/12/2019, 10:07 AM

## 2019-01-12 NOTE — Progress Notes (Signed)
MEDICATION RELATED CONSULT NOTE -  Pharmacy Consult for phenytoin Indication: seizure-like activity  Allergies  Allergen Reactions  . Ace Inhibitors Other (See Comments)    Acute renal failure    Patient Measurements: Height: 5' (152.4 cm) Weight: 121 lb 7.6 oz (55.1 kg) IBW/kg (Calculated) : 45.5  Vital Signs: Temp: 99.9 F (37.7 C) (03/15 1200) Temp Source: Axillary (03/15 1200) BP: 123/72 (03/15 1300) Pulse Rate: 106 (03/15 1300) Intake/Output from previous day: 03/14 0701 - 03/15 0700 In: 1049.9 [I.V.:419.9; NG/GT:630] Out: 1250 [Urine:1250] Intake/Output from this shift: Total I/O In: 357.6 [I.V.:177.6; NG/GT:180] Out: 300 [Urine:300]  Labs: Recent Labs    01/10/19 0443 01/11/19 0425 01/12/19 0539  WBC 9.2 6.3  --   HGB 10.5* 10.3*  --   HCT 34.3* 34.3*  --   PLT 264 255  --   CREATININE 0.81 0.78 0.63  ALBUMIN 2.0* 2.2* 2.3*  PROT 6.1* 6.3* 6.5  AST 50* 78* 37  ALT 47* 77* 58*  ALKPHOS 64 65 69  BILITOT 0.4 0.4 0.4   Estimated Creatinine Clearance: 54.6 mL/min (by C-G formula based on SCr of 0.63 mg/dL).   Infusions:  . sodium chloride 20 mL/hr at 01/12/19 1300    Assessment: 67 yoF admitted 3/5 unresponsive with AMS. EEG (preliminary report) was positive for seizure activity so patient initiated on fosphenytoin load followed by phenytoin maintenance dosing. Patient now s/p burst suppression.  Has been on phenytoin 100 mg q8h - was changed over to per tube 3/12, d/c'd on 3/13  Phenytoin level this am 19.2; corrects to 26.2 based on albumin of 2.3  Plan:  DC phenytoin Recheck level in am  Jeanella Cara, PharmD, [facility] Clinical Pharmacist Please see AMION for all Pharmacists' Contact Phone Numbers 01/12/2019, 3:05 PM

## 2019-01-13 DIAGNOSIS — J690 Pneumonitis due to inhalation of food and vomit: Secondary | ICD-10-CM

## 2019-01-13 LAB — GLUCOSE, CAPILLARY
Glucose-Capillary: 116 mg/dL — ABNORMAL HIGH (ref 70–99)
Glucose-Capillary: 128 mg/dL — ABNORMAL HIGH (ref 70–99)
Glucose-Capillary: 129 mg/dL — ABNORMAL HIGH (ref 70–99)
Glucose-Capillary: 134 mg/dL — ABNORMAL HIGH (ref 70–99)
Glucose-Capillary: 80 mg/dL (ref 70–99)
Glucose-Capillary: 81 mg/dL (ref 70–99)
Glucose-Capillary: 98 mg/dL (ref 70–99)
Glucose-Capillary: 99 mg/dL (ref 70–99)

## 2019-01-13 LAB — BASIC METABOLIC PANEL
Anion gap: 9 (ref 5–15)
BUN: 21 mg/dL (ref 8–23)
CALCIUM: 9 mg/dL (ref 8.9–10.3)
CO2: 28 mmol/L (ref 22–32)
Chloride: 103 mmol/L (ref 98–111)
Creatinine, Ser: 0.63 mg/dL (ref 0.44–1.00)
GFR calc Af Amer: >60 mL/min (ref >60)
Glucose, Bld: 108 mg/dL — ABNORMAL HIGH (ref 70–99)
Potassium: 3.6 mmol/L (ref 3.5–5.1)
Sodium: 140 mmol/L (ref 135–145)

## 2019-01-13 LAB — PHENYTOIN LEVEL, TOTAL: Phenytoin Lvl: 14.1 ug/mL (ref 10.0–20.0)

## 2019-01-13 NOTE — Progress Notes (Signed)
Subjective: Continues to make some progress  Exam: Vitals:   01/13/19 0811 01/13/19 0900  BP:  125/72  Pulse:  88  Resp:  13  Temp:    SpO2: 98% 99%   Gen: In bed, NAD Resp: non-labored breathing, no acute distress Abd: soft, nt  Neuro: MS: Awakens to mild stimuli, she does follow commands to show a thumbs up and squeeze hands, but will not wiggle toes to command. ML:YYTKP, has right gaze preference.  Motor: withdraws L to nox stim, moves right side well.  Sensory:intact to LT  Pertinent Labs: Dilantin 11.6  Impression: 68 yo F with h/o stroke who presented with refractory status epilepticus secondary to previous stroke.  She was in burst suppression, and took quite some time to clear medications but now appears to be gradually improving.  She continues to have a right gaze preference and left hemiparesis, and I suspect this is due to her previous stroke.  It is possible she has had some degree of recurrent injury due to her status epilepticus.  The fact that she has beginning to follow commands is reassuring.  Recommendations: 1) repeat EEG 2) restart Dilantin at 75 mg 3 times daily, daily levels 3) continue Vimpat 200 twice daily 4) continue Keppra 1500 twice daily 5) neurology will continue to follow  Ritta Slot, MD [facility] [phone]  If 7pm- 7am, please page neurology on call as listed in AMION.

## 2019-01-13 NOTE — Progress Notes (Signed)
MEDICATION RELATED CONSULT NOTE -  Pharmacy Consult for phenytoin Indication: seizure-like activity  Allergies  Allergen Reactions  . Ace Inhibitors Other (See Comments)    Acute renal failure    Patient Measurements: Height: 5' (152.4 cm) Weight: 121 lb 7.6 oz (55.1 kg) IBW/kg (Calculated) : 45.5  Vital Signs: Temp: 98 F (36.7 C) (03/16 0400) Temp Source: Axillary (03/16 0400) BP: 125/72 (03/16 0900) Pulse Rate: 88 (03/16 0900) Intake/Output from previous day: 03/15 0701 - 03/16 0700 In: 2801.5 [I.V.:511.5; NG/GT:2290] Out: 1000 [Urine:1000] Intake/Output from this shift: Total I/O In: 90 [I.V.:30; NG/GT:60] Out: -   Labs: Recent Labs    01/11/19 0425 01/12/19 0539 01/13/19 0404  WBC 6.3  --   --   HGB 10.3*  --   --   HCT 34.3*  --   --   PLT 255  --   --   CREATININE 0.78 0.63 0.63  ALBUMIN 2.2* 2.3*  --   PROT 6.3* 6.5  --   AST 78* 37  --   ALT 77* 58*  --   ALKPHOS 65 69  --   BILITOT 0.4 0.4  --    Estimated Creatinine Clearance: 54.6 mL/min (by C-G formula based on SCr of 0.63 mg/dL).   Infusions:  . sodium chloride 10 mL/hr at 01/13/19 0800    Assessment: 67 yoF admitted 3/5 unresponsive with AMS. EEG (preliminary report) was positive for seizure activity so patient initiated on fosphenytoin load followed by phenytoin maintenance dosing. Patient now s/p burst suppression.  Has been on phenytoin 100 mg q8h - was changed over to per tube 3/12, d/c'd on 3/13  Phenytoin level this am 14.1; corrects to 19.2 based on albumin of 2.3. Patient remains deeply sedated today despite no active sedative medication.   Plan:  Continue to hold phenytoin today  Recheck level in am Anticipate will be able to resume lower dose phenytoin tomorrow if level trends down further. Will discuss with neurology prior to re-initiating therapy   Vinnie Level, PharmD., BCPS Clinical Pharmacist Clinical phone for 01/13/19 until 3:30pm: [phone] If after 3:30pm,  please refer to [facility] for unit-specific pharmacist

## 2019-01-13 NOTE — Progress Notes (Signed)
NAMEMarjori Wang, MRN:  [id number], DOB:  [birth year], LOS: 11 ADMISSION DATE:  01/02/2019, CONSULTATION DATE:  3/5 REFERRING MD:  Dr. Jerral Ralph, CHIEF COMPLAINT:  Status epilepticus   Brief History   68 year old female admitted with AMS found to have status epilepticus refractory to the AEDs and required transfer to ICU for intubation and continuous infusion.    Past Medical History   has a past medical history of High cholesterol, Hypertension, and Stroke (HCC).   Significant Hospital Events   3/5 admit to floor, then ICU transfer.  3/6 intubated and on burst suppression per Neurology 3/8 versed stopped. Blood cultures cleared.  3/9 propofol stopped.  No further seizures 3/12 now off sedation X 72 hours no sig improvement  3/13: Withdrawing to pain, otherwise no significant changes 3/14: Following commands intermittently 3/15: Much more awake, following commands still.  Opening eyes to request.  Starting pressure support trials  Consults:  Neurology  Procedures:  ETT 3/6>> RUE PICC by IR 3/11>>>  Significant Diagnostic Tests:  CT head 3/5 > small vessel disease. No acute cortically based infarct or hemorrhage.  CT angio and perfusion 3/5 > negative large vessel occlusion. No core infarct or penumbra. Moderate to severe ICA stenosis. RUL pneumonia partially visible.  CT C-spine 3/5 > No cervical spine fracture or subluxation. Mild multilevel degenerative changes. EEG 3/5 >Continuous epileptiform discharge involving the right temporal region meeting the electrographic criteria for for status epilepticus CXR 01/04/19 > Patchy airspace disease on right side in RUL and RML, ETT appropriate position, interval placement of L IJ CVC MRI 01/07/19 > Chronic microvascular disease and atrophy but nil acute (personally reviewed)  Micro Data:  Blood 3/5 > staph epidermis  Blood 3/6 >neg Tracheal aspirate  3/6>NF  Antimicrobials:  Ceftriaxone 3/5  Flagyl 3/5  Unasyn 3/5 >>>6 days rx   Interim history/subjective:  Per RN, improved further this AM.  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  She was apparently opening eyes spontaneously earlier this morning.  Objective   Blood pressure 125/72, pulse 88, temperature 98 F (36.7 C), temperature source Axillary, resp. rate 13, height 5' (1.524 m), weight 55.1 kg, SpO2 99 %.    Vent Mode: CPAP;PSV FiO2 (%):  [30 %] 30 % Set Rate:  [15 bmp] 15 bmp Vt Set:  [360 mL] 360 mL PEEP:  [5 cmH20] 5 cmH20 Pressure Support:  [10 cmH20] 10 cmH20 Plateau Pressure:  [12 cmH20-14 cmH20] 13 cmH20   Intake/Output Summary (Last 24 hours) at 01/13/2019 1104 Last data filed at 01/13/2019 0800 Gross per 24 hour  Intake 2633.86 ml  Output 700 ml  Net 1933.86 ml   Filed Weights   01/10/19 0442 01/11/19 0500 01/12/19 0500  Weight: 65 kg 56 kg 55.1 kg   Physical Exam: General: Elderly appearing female, difficult to arouse though per RN had spontaneous eye opening earlier this AM HEENT normocephalic atraumatic orally intubated Pulmonary: Clear to auscultation no accessory use Cardiac: Regular rate and rhythm Abdomen: Soft not tender Extremities: Warm and dry GU: Clear yellow Neuro: Difficult to arouse, moves right arm to pain, left sided hemiparesis   Resolved Hospital Problem list   Hypokalemia Aspiration PNA Staph epidermis bacteremia   Assessment & Plan:   Status epilepticus - no recurrent seizures and EEG still shows burst suppression.  Given accumulation of sedatives, make  take several days for sensorium to clear. MRI shows nil acute,  Following commands intermittently as of 3/14 Plan. Continuing AEDs Supportive care Serial neuro checks  Acute hypoxemic respiratory insufficiency due to inability to protect airway Plan: Pressure support ventilation as tolerated PAD protocol RASS goal 0 Daily  assessment for extubation as mental status improves  H/o Hypertension & Hyperlipidemia Plan Continue home Diltiazem (but q 6 dosing)  Fluid and electrolyte imbalance: Hypernatremia, this is improved.  Hypokalemia Plan Continue current free water replacement  Inadequate oral intake Plan Continue tube feeds  Skin breakdown.  Worsening sacral ulceration due to frequent stools Plan Continue routine wound care  Best practice:  Diet: TF at goal Pain/Anxiety/Delirium protocol (if indicated): None VAP protocol (if indicated): Yes  DVT prophylaxis: enoxaparin  GI prophylaxis: PPI Glucose control: N/a Mobility: BR Code Status: Full Family Communication: No family at bedside Disposition: She is still critically ill due to her need for mechanical ventilation however she continues to improve from mental status, though gradually.  Her Dilantin levels are continuing to decline.  Wonder how much of this was Dilantin toxicity as well as she spiked to 24.9 and this does not account for her low albumin levels.  We will continue the current therapy and supportive measures.  I am hopeful we will be able to extubate her in the next 48 hours or so   Critical care time: 30 min    Dailey Alberson Celine Mans, PA - C [place] Pulmonary & Critical Care Medicine Pager: [phone].  If no answer, [phone] 01/13/2019, 11:11 AM

## 2019-01-14 ENCOUNTER — Inpatient Hospital Stay (HOSPITAL_COMMUNITY): Payer: Medicare HMO

## 2019-01-14 LAB — GLUCOSE, CAPILLARY
Glucose-Capillary: 114 mg/dL — ABNORMAL HIGH (ref 70–99)
Glucose-Capillary: 131 mg/dL — ABNORMAL HIGH (ref 70–99)
Glucose-Capillary: 109 mg/dL — ABNORMAL HIGH (ref 70–99)
Glucose-Capillary: 123 mg/dL — ABNORMAL HIGH (ref 70–99)
Glucose-Capillary: 119 mg/dL — ABNORMAL HIGH (ref 70–99)

## 2019-01-14 LAB — CBC
HCT: 32.1 % — ABNORMAL LOW (ref 36.0–46.0)
Hemoglobin: 9.9 g/dL — ABNORMAL LOW (ref 12.0–15.0)
MCH: 29.7 pg (ref 26.0–34.0)
MCHC: 30.8 g/dL (ref 30.0–36.0)
MCV: 96.4 fL (ref 80.0–100.0)
Platelets: 280 10*3/uL (ref 150–400)
RBC: 3.33 MIL/uL — ABNORMAL LOW (ref 3.87–5.11)
RDW: 15 % (ref 11.5–15.5)
WBC: 8.2 10*3/uL (ref 4.0–10.5)
nRBC: 0 % (ref 0.0–0.2)

## 2019-01-14 LAB — COMPREHENSIVE METABOLIC PANEL
ALT: 37 U/L (ref 0–44)
AST: 26 U/L (ref 15–41)
Albumin: 2.2 g/dL — ABNORMAL LOW (ref 3.5–5.0)
Alkaline Phosphatase: 73 U/L (ref 38–126)
Anion gap: 9 (ref 5–15)
BILIRUBIN TOTAL: 0.4 mg/dL (ref 0.3–1.2)
BUN: 22 mg/dL (ref 8–23)
CO2: 28 mmol/L (ref 22–32)
Calcium: 8.9 mg/dL (ref 8.9–10.3)
Chloride: 100 mmol/L (ref 98–111)
Creatinine, Ser: 0.7 mg/dL (ref 0.44–1.00)
GFR calc Af Amer: >60 mL/min (ref >60)
GFR calc non Af Amer: >60 mL/min (ref >60)
Glucose, Bld: 120 mg/dL — ABNORMAL HIGH (ref 70–99)
Potassium: 3.6 mmol/L (ref 3.5–5.1)
Sodium: 137 mmol/L (ref 135–145)
Total Protein: 6.4 g/dL — ABNORMAL LOW (ref 6.5–8.1)

## 2019-01-14 LAB — PHENYTOIN LEVEL, TOTAL: Phenytoin Lvl: 11.6 ug/mL (ref 10.0–20.0)

## 2019-01-14 LAB — MAGNESIUM: Magnesium: 1.9 mg/dL (ref 1.7–2.4)

## 2019-01-14 LAB — PHOSPHORUS: PHOSPHORUS: 2.7 mg/dL (ref 2.5–4.6)

## 2019-01-14 MED ORDER — GERHARDT'S BUTT CREAM
TOPICAL_CREAM | Freq: Three times a day (TID) | CUTANEOUS | Status: AC
  Administered 2019-01-14: 1 via TOPICAL
  Administered 2019-01-14: 17:00:00 via TOPICAL
  Administered 2019-01-15 (×2): 1 via TOPICAL
  Administered 2019-01-15 – 2019-01-16 (×4): via TOPICAL
  Administered 2019-01-17: 1 via TOPICAL
  Administered 2019-01-17 – 2019-01-19 (×6): via TOPICAL
  Administered 2019-01-19: 1 via TOPICAL
  Administered 2019-01-19 – 2019-01-23 (×13): via TOPICAL
  Filled 2019-01-14: qty 1

## 2019-01-14 MED ORDER — PHENYTOIN 125 MG/5ML PO SUSP
75.0000 mg | Freq: Three times a day (TID) | ORAL | Status: DC
  Administered 2019-01-14 (×3): 75 mg via ORAL
  Filled 2019-01-14 (×4): qty 4

## 2019-01-14 NOTE — Consult Note (Addendum)
WOC Nurse wound consult note Reason for Consult:New partial thickness tissue loss in the perineal area secondary to external female urinary incontinence device at right side near labia, inguinal crease Wound type: Pressure Pressure Injury POA: No Measurement: Two partial thickness areas in a 3.5cm x 3cm area.  Depth is 0.1cm  Wound bed: Pink, moist Drainage (amount, consistency, odor) scant serous Periwound:intact, macerated. Dressing procedure/placement/frequency: Will implement Gerhart's Butt Cream, a 1:1:1 compounded hydrocortisone, zinc oxide, lotrimin product. Patient is currently retaining urine and has had in and out catheterization x 2 today.  POC is to replace an indwelling urinary catheter after the next bladder scan if needed.   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 POA. Has evolved into Stage 3 PI. Measurement: 2cm x 8cm with depth of 0.1cm. Wound bed: Evolution has occurred and blistering and sloughing skin has been removed revealing a mostly red (60%), moist wound bed with 40% thin yellow slough. Drainage (amount, consistency, odor) small to moderate amount of yellow thin exudate Periwound: Intact, dry Dressing procedure/placement/frequency: I will add a strip of silver hydrofiber to the wound care, and continue to top with silicone foam dressing.  WOC nursing team will continue to follow, and will remain available to this patient, the nursing and medical teams.   Ladona Mow, MSN, RN, GNP, [place] [facility]  Pager# [phone]

## 2019-01-14 NOTE — Progress Notes (Signed)
NAMEAliscia Wang, MRN:  [id number], DOB:  Feb 13, 1951, LOS: 12 ADMISSION DATE:  01/02/2019, CONSULTATION DATE:  3/5 REFERRING MD:  Dr. Jerral Ralph, CHIEF COMPLAINT:  Status epilepticus   Brief History   68 year old female admitted with AMS found to have status epilepticus refractory to the AEDs and required transfer to ICU for intubation and continuous infusion.    Past Medical History   has a past medical history of High cholesterol, Hypertension, and Stroke (HCC).  Significant Hospital Events   3/5 admit to floor, then ICU transfer.  3/6 intubated and on burst suppression per Neurology 3/8 versed stopped. Blood cultures cleared.  3/9 propofol stopped.  No further seizures 3/12 now off sedation X 72 hours no sig improvement  3/13: Withdrawing to pain, otherwise no significant changes 3/14: Following commands intermittently 3/15: Much more awake, following commands still.  Opening eyes to request.  Starting pressure support trials  Consults:  Neurology  Procedures:  ETT 3/6>> RUE PICC by IR 3/11>>>  Significant Diagnostic Tests:  CT head 3/5 > small vessel disease. No acute cortically based infarct or hemorrhage.  CT angio and perfusion 3/5 > negative large vessel occlusion. No core infarct or penumbra. Moderate to severe ICA stenosis. RUL pneumonia partially visible.  CT C-spine 3/5 > No cervical spine fracture or subluxation. Mild multilevel degenerative changes. EEG 3/5 >Continuous epileptiform discharge involving the right temporal region meeting the electrographic criteria for for status epilepticus CXR 01/04/19 > Patchy airspace disease on right side in RUL and RML, ETT appropriate position, interval placement of L IJ CVC MRI 01/07/19 > Chronic microvascular disease and atrophy but nil acute (personally reviewed)  Micro Data:  Blood 3/5 > staph epidermis  Blood 3/6 >neg Tracheal aspirate  3/6>NF  Antimicrobials:  Ceftriaxone 3/5  Flagyl 3/5  Unasyn 3/5 >>>6 days rx    Interim history/subjective:  Tracked appropriately this AM for neurology and moved right arm.  Barely grimaced for me during my exam. In and out cathed 3 times thus far.  Objective   Blood pressure 122/73, pulse 89, temperature 98.5 F (36.9 C), temperature source Axillary, resp. rate 16, height 5' (1.524 m), weight 54.2 kg, SpO2 99 %.    Vent Mode: PRVC FiO2 (%):  [30 %] 30 % Set Rate:  [15 bmp] 15 bmp Vt Set:  [360 mL] 360 mL PEEP:  [5 cmH20] 5 cmH20 Pressure Support:  [10 cmH20] 10 cmH20 Plateau Pressure:  [13 cmH20-14 cmH20] 14 cmH20   Intake/Output Summary (Last 24 hours) at 01/14/2019 0957 Last data filed at 01/14/2019 0800 Gross per 24 hour  Intake 1498.59 ml  Output 1550 ml  Net -51.41 ml   Filed Weights   01/11/19 0500 01/12/19 0500 01/14/19 0500  Weight: 56 kg 55.1 kg 54.2 kg   Physical Exam: General: Elderly appearing female, difficult to arouse HEENT normocephalic atraumatic orally intubated Pulmonary: Clear to auscultation no accessory use Cardiac: Regular rate and rhythm Abdomen: Soft not tender Extremities: Warm and dry Neuro: Difficult to arouse, moves right arm to pain, left sided hemiparesis   Resolved Hospital Problem list   Hypokalemia Aspiration PNA Staph epidermis bacteremia   Assessment & Plan:   Status epilepticus - no recurrent seizures and EEG still shows burst suppression.  Acute encephalopathy - felt to be phenytoin toxicity per neuro.  Plan: Continuing AEDs per neuro Supportive care Serial neuro checks   Acute hypoxemic respiratory insufficiency due to inability to protect airway. Plan: Continue pressure support ventilation as tolerated  PAD protocol RASS goal 0 Daily assessment for extubation as mental status continues to improve  H/o Hypertension & Hyperlipidemia. Plan: Continue home Diltiazem (but q 6 dosing)  Fluid and electrolyte imbalance: Hypernatremia, this is improved.  Hypokalemia Plan: Continue current free water  replacement  Inadequate oral intake Plan: Continue tube feeds  Skin breakdown.  Worsening sacral ulceration due to frequent stools Plan: Continue routine wound care  Urinary retention - s/p in and out cath x 3. Plan: Insert foley  Best practice:  Diet: TF at goal Pain/Anxiety/Delirium protocol (if indicated): None VAP protocol (if indicated): Yes  DVT prophylaxis: enoxaparin  GI prophylaxis: PPI Glucose control: N/a Mobility: BR Code Status: Full Family Communication: No family at bedside Disposition: She is still critically ill due to her need for mechanical ventilation however she continues to improve from mental status, though gradually.  Per neuro, likely scenario is dilantin toxicity.  We will continue the current therapy and supportive measures and hope to have her sensorium clear and move towards extubation by the end of the week.  Critical care time: 30 min    Rutherford Guys, PA - C  Pulmonary & Critical Care Medicine Pager: [phone].  If no answer, [phone] - [phone] 01/14/2019, 9:57 AM

## 2019-01-14 NOTE — Procedures (Signed)
History: 68 year old female status post bur suppression for status epilepticus  Sedation: None  Technique: This is a 21 channel routine scalp EEG performed at the bedside with bipolar and monopolar montages arranged in accordance to the international 10/20 system of electrode placement. One channel was dedicated to EKG recording.    Background: There is a posterior dominant rhythm which is seen bilaterally and achieves a maximal frequency of 8 Hz.  In addition, there are frequent right temporal sharp waves maximal at F8, T8 this sometimes seen with a wider field including P8, P4. there is also generalized irregular slow activity in this region with attenuation of faster frequencies in the temporoparietal region.Marland Kitchen  Photic stimulation: Physiologic driving is not performed  EEG Abnormalities: 1) right temporal sharp waves 2) right temporoparietal dysfunction  Clinical Interpretation: This EEG reveals evidence of cortical irritability and potential seizure focus in the right temporoparietal region.   There was no seizure recorded on this study.   Ritta Slot, MD [facility] [phone]  If 7pm- 7am, please page neurology on call as listed in AMION.

## 2019-01-14 NOTE — Progress Notes (Signed)
eLink Physician-Brief Progress Note Patient Name: Erika Wang DOB: [birth year] MRN: [id number]   Date of Service  01/14/2019  HPI/Events of Note  Oliguria - Bladder scan with 549 mL residual.   eICU Interventions  Will order: 1. I/O Cath PRN.      Intervention Category Intermediate Interventions: Oliguria - evaluation and management  Karan Ramnauth [place] 01/14/2019, 2:56 AM

## 2019-01-14 NOTE — Progress Notes (Signed)
MEDICATION RELATED CONSULT NOTE -  Pharmacy Consult for phenytoin Indication: seizure-like activity  Allergies  Allergen Reactions  . Ace Inhibitors Other (See Comments)    Acute renal failure    Patient Measurements: Height: 5' (152.4 cm) Weight: 119 lb 7.8 oz (54.2 kg) IBW/kg (Calculated) : 45.5  Vital Signs: Temp: 98 F (36.7 C) (03/17 0400) Temp Source: Axillary (03/17 0400) BP: 132/57 (03/17 0800) Pulse Rate: 93 (03/17 0800) Intake/Output from previous day: 03/16 0701 - 03/17 0700 In: 1279.9 [I.V.:199.9; NG/GT:1080] Out: 1550 [Urine:1550] Intake/Output from this shift: Total I/O In: 308.7 [I.V.:68.7; NG/GT:240] Out: -   Labs: Recent Labs    01/12/19 0539 01/13/19 0404 01/14/19 0429  WBC  --   --  8.2  HGB  --   --  9.9*  HCT  --   --  32.1*  PLT  --   --  280  CREATININE 0.63 0.63 0.70  MG  --   --  1.9  PHOS  --   --  2.7  ALBUMIN 2.3*  --  2.2*  PROT 6.5  --  6.4*  AST 37  --  26  ALT 58*  --  37  ALKPHOS 69  --  73  BILITOT 0.4  --  0.4   Estimated Creatinine Clearance: 50.4 mL/min (by C-G formula based on SCr of 0.7 mg/dL).   Infusions:  . sodium chloride 10 mL/hr at 01/14/19 0800    Assessment: 67 yoF admitted 3/5 unresponsive with AMS. EEG (preliminary report) was positive for seizure activity so patient initiated on fosphenytoin load followed by phenytoin maintenance dosing. Patient now s/p burst suppression.  Has been on phenytoin 100 mg q8h - was changed over to per tube 3/12, d/c'd on 3/13  Phenytoin level this am 11.6; corrects to ~16 based on albumin of 2.2. Her mental status has improved today. Following some commands with upper extremities   Plan:  -Discussed with neurology. Will resume phenytoin at lower dose of 75 mg TID and continue to check daily levels.  -Low threshold to decrease dose if levels start rising quickly   Vinnie Level, PharmD., BCPS Clinical Pharmacist Clinical phone for 01/14/19 until 3:30pm: [phone] If  after 3:30pm, please refer to [facility] for unit-specific pharmacist

## 2019-01-14 NOTE — Progress Notes (Signed)
EEG complete - results pending 

## 2019-01-15 LAB — BASIC METABOLIC PANEL
Anion gap: 9 (ref 5–15)
BUN: 23 mg/dL (ref 8–23)
CALCIUM: 9.3 mg/dL (ref 8.9–10.3)
CO2: 30 mmol/L (ref 22–32)
Chloride: 100 mmol/L (ref 98–111)
Creatinine, Ser: 0.6 mg/dL (ref 0.44–1.00)
GFR calc Af Amer: >60 mL/min (ref >60)
GFR calc non Af Amer: >60 mL/min (ref >60)
Glucose, Bld: 97 mg/dL (ref 70–99)
Potassium: 3.9 mmol/L (ref 3.5–5.1)
Sodium: 139 mmol/L (ref 135–145)

## 2019-01-15 LAB — CBC
HCT: 29.3 % — ABNORMAL LOW (ref 36.0–46.0)
Hemoglobin: 9.4 g/dL — ABNORMAL LOW (ref 12.0–15.0)
MCH: 30.8 pg (ref 26.0–34.0)
MCHC: 32.1 g/dL (ref 30.0–36.0)
MCV: 96.1 fL (ref 80.0–100.0)
Platelets: 270 10*3/uL (ref 150–400)
RBC: 3.05 MIL/uL — ABNORMAL LOW (ref 3.87–5.11)
RDW: 15 % (ref 11.5–15.5)
WBC: 6.8 10*3/uL (ref 4.0–10.5)
nRBC: 0 % (ref 0.0–0.2)

## 2019-01-15 LAB — GLUCOSE, CAPILLARY
GLUCOSE-CAPILLARY: 138 mg/dL — AB (ref 70–99)
Glucose-Capillary: 102 mg/dL — ABNORMAL HIGH (ref 70–99)
Glucose-Capillary: 115 mg/dL — ABNORMAL HIGH (ref 70–99)
Glucose-Capillary: 137 mg/dL — ABNORMAL HIGH (ref 70–99)
Glucose-Capillary: 143 mg/dL — ABNORMAL HIGH (ref 70–99)
Glucose-Capillary: 173 mg/dL — ABNORMAL HIGH (ref 70–99)
Glucose-Capillary: 76 mg/dL (ref 70–99)

## 2019-01-15 LAB — PHENYTOIN LEVEL, TOTAL: Phenytoin Lvl: 9.9 ug/mL — ABNORMAL LOW (ref 10.0–20.0)

## 2019-01-15 LAB — MAGNESIUM: Magnesium: 1.9 mg/dL (ref 1.7–2.4)

## 2019-01-15 LAB — PHOSPHORUS: Phosphorus: 2.6 mg/dL (ref 2.5–4.6)

## 2019-01-15 MED ORDER — PHENYTOIN 125 MG/5ML PO SUSP
75.0000 mg | Freq: Two times a day (BID) | ORAL | Status: DC
  Administered 2019-01-15 – 2019-01-25 (×21): 75 mg
  Filled 2019-01-15 (×22): qty 4

## 2019-01-15 MED ORDER — BETHANECHOL CHLORIDE 10 MG PO TABS
5.0000 mg | ORAL_TABLET | Freq: Three times a day (TID) | ORAL | Status: DC
  Administered 2019-01-15 – 2019-01-21 (×18): 5 mg
  Filled 2019-01-15 (×18): qty 1

## 2019-01-15 MED ORDER — PHENYTOIN 125 MG/5ML PO SUSP
100.0000 mg | Freq: Every day | ORAL | Status: DC
  Administered 2019-01-15 – 2019-01-25 (×11): 100 mg
  Filled 2019-01-15 (×12): qty 4

## 2019-01-15 MED ORDER — BETHANECHOL CHLORIDE 10 MG PO TABS: 5.0000 mg | ORAL_TABLET | Freq: Three times a day (TID) | ORAL | Status: DC

## 2019-01-15 NOTE — Progress Notes (Addendum)
MEDICATION RELATED CONSULT NOTE -  Pharmacy Consult for phenytoin Indication: seizure-like activity   Assessment: 68 year old female admitted 3/5 unresponsive with AMS. EEG (preliminary report) was positive for seizure activity so patient initiated on fosphenytoin load followed by phenytoin maintenance dosing. Patient now s/p burst suppression.  Phenytoin level this am 9.9; corrects to ~14.3 (therapeutic) based on albumin of 2.2.  Plan:  - Change phenytoin to 100 mg qAM, 75 mg in the afternoon, 75 mg in the evening - Daily phenytoin level   [name] 01/15/2019 8:32 AM

## 2019-01-15 NOTE — Progress Notes (Signed)
NAMEOple Ishibashi, MRN:  [id number], DOB:  Jun 01, 1951, LOS: 13 ADMISSION DATE:  01/02/2019, CONSULTATION DATE:  3/5 REFERRING MD:  Dr. Jerral Ralph, CHIEF COMPLAINT:  Status epilepticus   Brief History   68 year old female admitted with AMS found to have status epilepticus refractory to the AEDs and required transfer to ICU for intubation and continuous infusion.    Past Medical History   has a past medical history of High cholesterol, Hypertension, and Stroke (HCC).  Significant Hospital Events   3/5 admit to floor, then ICU transfer.  3/6 intubated and on burst suppression per Neurology 3/8 versed stopped. Blood cultures cleared.  3/9 propofol stopped.  No further seizures 3/12 now off sedation X 72 hours no sig improvement  3/13: Withdrawing to pain, otherwise no significant changes 3/14: Following commands intermittently 3/15: Much more awake, following commands still.  Opening eyes to request.  Starting pressure support trials 3/18: Still sedated, Following commands. Poor effort on SBT  Consults:  Neurology  Procedures:  ETT 3/6>> RUE PICC by IR 3/11>>>  Significant Diagnostic Tests:  CT head 3/5 > small vessel disease. No acute cortically based infarct or hemorrhage.  CT angio and perfusion 3/5 > negative large vessel occlusion. No core infarct or penumbra. Moderate to severe ICA stenosis. RUL pneumonia partially visible.  CT C-spine 3/5 > No cervical spine fracture or subluxation. Mild multilevel degenerative changes. EEG 3/5 >Continuous epileptiform discharge involving the right temporal region meeting the electrographic criteria for for status epilepticus CXR 01/04/19 > Patchy airspace disease on right side in RUL and RML, ETT appropriate position, interval placement of L IJ CVC MRI 01/07/19 > Chronic microvascular disease and atrophy but nil acute (personally reviewed) EEG 3/17 > R temporal sharp waves. T temporoparietal dysfunction   Micro Data:  Blood 3/5 > staph  epidermis Blood 3/6 >neg Tracheal aspirate 3/6 > NF  Antimicrobials:  Ceftriaxone 3/5  Flagyl 3/5  Unasyn 3/5 > 3/11   Interim history/subjective:  No acute events overnight.   Objective   Blood pressure 125/80, pulse 90, temperature 98.9 F (37.2 C), temperature source Axillary, resp. rate 15, height 5' (1.524 m), weight 54.2 kg, SpO2 100 %.    Vent Mode: PRVC FiO2 (%):  [30 %] 30 % Set Rate:  [15 bmp] 15 bmp Vt Set:  [360 mL] 360 mL PEEP:  [5 cmH20] 5 cmH20 Pressure Support:  [10 cmH20] 10 cmH20 Plateau Pressure:  [13 cmH20-14 cmH20] 14 cmH20   Intake/Output Summary (Last 24 hours) at 01/15/2019 0836 Last data filed at 01/15/2019 0800 Gross per 24 hour  Intake 1979.91 ml  Output 1525 ml  Net 454.91 ml   Filed Weights   01/11/19 0500 01/12/19 0500 01/14/19 0500  Weight: 56 kg 55.1 kg 54.2 kg   Physical Exam:  General: Elderly appearing female, difficult to arouse HEENT [place]/AT, PERRL, ETT in place.  Pulmonary: Clear to auscultation no accessory use Cardiac: RRR, no MRG Abdomen: Soft, non-distended. Normoactive BS.  Extremities: Warm and dry Neuro: Able to follow some commands.    Resolved Hospital Problem list   Hypokalemia Aspiration PNA Staph epidermis bacteremia  Assessment & Plan:   Status epilepticus Acute encephalopathy Plan: Continuing AEDs per neuro (Vimpat, Keppra, phenytoin)  Supportive care Serial neuro checks  Need to discuss trach/ped with family. Neurology believes this to be an appropriate intervention should her family agree. Will be a months long recovery and likely not return to baseline.   Acute hypoxemic respiratory insufficiency due to  inability to protect airway. Plan: Continue pressure support ventilation as tolerated (periods of apnea and poor patient effort) PAD protocol RASS goal 0 Daily assessment for extubation as mental status continues to improve  H/o Hypertension & Hyperlipidemia Plan: Continue home Diltiazem (but q 6  dosing)  Fluid and electrolyte imbalance: Hypernatremia, this is improved.  Hypokalemia Plan: Continue current free water replacement  Inadequate oral intake Plan: Continue tube feeds  Skin breakdown.  Worsening sacral ulceration due to frequent stools Plan: Continue routine wound care  Urinary retention - s/p in and out cath x 3. Plan: Maintain foley Start urecholine   Best practice:  Diet: TF at goal Pain/Anxiety/Delirium protocol (if indicated): None VAP protocol (if indicated): Yes  DVT prophylaxis: enoxaparin  GI prophylaxis: PPI Glucose control: N/a Mobility: BR Code Status: Full Family Communication: No family at bedside Disposition: Continue ICU. Need to determine trach/PEG. RN contacting family to set meeting.   Critical care time: 30 mins     Joneen Roach, AGACNP-BC [facility] Pulmonary/Critical Care Pager [phone] or [phone]  01/15/2019 8:41 AM

## 2019-01-15 NOTE — Progress Notes (Signed)
Subjective: Continues to make some progress  Exam: Vitals:   01/15/19 1541 01/15/19 1600  BP: 113/68 114/66  Pulse: 98 96  Resp: 15 18  Temp:  99.7 F (37.6 C)  SpO2: 100% 98%   Gen: In bed, NAD Resp: non-labored breathing, no acute distress Abd: soft, nt  Neuro: MS: Awakens to mild stimuli, she engages with me an dfollows physical prompting to squeeze hand but does not clearly follow verbally today.  SW:FUXNA, has right gaze preference.  Motor: withdraws L to nox stim, moves right side well.  Sensory:intact to LT  Pertinent Labs: Dilantin 9.9  Impression: 68 yo F with h/o stroke who presented with refractory status epilepticus secondary to previous stroke.  She was in burst suppression, and took quite some time to clear medications but now appears to be gradually improving.  She continues to have a right gaze preference and left hemiparesis, and I suspect this is due to her previous stroke.  It is possible she has had some degree of recurrent injury due to her status epilepticus.  The fact that she has beginning to follow commands is reassuring.  Recommendations: 1) increase dilantin to 100-75-75 2) continue Vimpat 200 twice daily 3) continue Keppra 1500 twice daily 4) neurology will continue to follow  Ritta Slot, MD [facility] [phone]  If 7pm- 7am, please page neurology on call as listed in AMION.

## 2019-01-15 NOTE — Progress Notes (Signed)
Nutrition Follow-up  DOCUMENTATION CODES:   Not applicable  INTERVENTION:   Continue tube feeding:  -Vital AF 1.2 @ 30 ml/hr via OGT (720 ml) -30 ml Prostat TID -Free water flushes 200 ml QID per MD  Provides: 1164 kcals, 99 grams protein, 584 ml free water (1384 ml with flushes).   NUTRITION DIAGNOSIS:   Inadequate oral intake related to inability to eat(pt sedated and ventilated ) as evidenced by NPO status.  Ongoing  GOAL:   Patient will meet greater than or equal to 90% of their needs  Met with TF  MONITOR:   Vent status, TF tolerance, Weight trends, Labs, Skin  REASON FOR ASSESSMENT:   Ventilator    ASSESSMENT:   68 y.o. 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 Pt found to have PNA with sepsis. Pt now intubated s/p seizure.     Pt discussed during ICU rounds and with RN. Pt off sedation, occassionally follows commands. Unable to tolerate SBT at this time. Discussion for trach/PEG with family soon.   Pt tolerating Vital AF 1.2 @ 30 ml/hr with Prostat TID. Continue with regimen.  Weight noted to decrease from 153 lb on 3/10 to 119 lb today. Will continue to monitor trends.   Patient is currently intubated on ventilator support MV: 6.8 L/min Temp (24hrs), Avg:98.5 F (36.9 C), Min:98.2 F (36.8 C), Max:98.9 F (37.2 C) BP: 120/74 MAP: 87  I/O: +12,035 ml since admit UOP: 1525 ml x 24 hrs   Medications reviewed and include: SS novolog, liquid MVI Labs reviewed: CBG 76-137  Diet Order:   Diet Order            Diet NPO time specified  Diet effective now              EDUCATION NEEDS:   No education needs have been identified at this time  Skin:  Skin Assessment: Skin Integrity Issues: Skin Integrity Issues:: Other (Comment) Other: pressure injury sacrum  Last BM:  3/17  Height:    Ht Readings from Last 1 Encounters:  01/02/19 5' (1.524 m)    Weight:   Wt Readings from Last 1 Encounters:  01/14/19 54.2 kg    Ideal Body Weight:  45.4 kg  BMI:  Body mass index is 23.34 kg/m.  Estimated Nutritional Needs:   Kcal:  1129 kcal  Protein:  80-105 grams  Fluid:  >/= 1.5 L/day   Mariana Single RD, LDN Clinical Nutrition Pager # - [phone]

## 2019-01-16 ENCOUNTER — Inpatient Hospital Stay (HOSPITAL_COMMUNITY): Payer: Medicare HMO

## 2019-01-16 LAB — BASIC METABOLIC PANEL
Anion gap: 9 (ref 5–15)
BUN: 20 mg/dL (ref 8–23)
CHLORIDE: 101 mmol/L (ref 98–111)
CO2: 28 mmol/L (ref 22–32)
Calcium: 9.1 mg/dL (ref 8.9–10.3)
Creatinine, Ser: 0.55 mg/dL (ref 0.44–1.00)
GFR calc Af Amer: >60 mL/min (ref >60)
GFR calc non Af Amer: >60 mL/min (ref >60)
Glucose, Bld: 127 mg/dL — ABNORMAL HIGH (ref 70–99)
Potassium: 3.6 mmol/L (ref 3.5–5.1)
Sodium: 138 mmol/L (ref 135–145)

## 2019-01-16 LAB — GLUCOSE, CAPILLARY
GLUCOSE-CAPILLARY: 118 mg/dL — AB (ref 70–99)
GLUCOSE-CAPILLARY: 143 mg/dL — AB (ref 70–99)
Glucose-Capillary: 88 mg/dL (ref 70–99)
Glucose-Capillary: 122 mg/dL — ABNORMAL HIGH (ref 70–99)
Glucose-Capillary: 91 mg/dL (ref 70–99)
Glucose-Capillary: 132 mg/dL — ABNORMAL HIGH (ref 70–99)

## 2019-01-16 LAB — CBC
HCT: 30.9 % — ABNORMAL LOW (ref 36.0–46.0)
HEMOGLOBIN: 9.7 g/dL — AB (ref 12.0–15.0)
MCH: 30 pg (ref 26.0–34.0)
MCHC: 31.4 g/dL (ref 30.0–36.0)
MCV: 95.7 fL (ref 80.0–100.0)
Platelets: 320 10*3/uL (ref 150–400)
RBC: 3.23 MIL/uL — ABNORMAL LOW (ref 3.87–5.11)
RDW: 15.2 % (ref 11.5–15.5)
WBC: 7.6 10*3/uL (ref 4.0–10.5)
nRBC: 0 % (ref 0.0–0.2)

## 2019-01-16 LAB — PHENYTOIN LEVEL, TOTAL: Phenytoin Lvl: 11.4 ug/mL (ref 10.0–20.0)

## 2019-01-16 MED ORDER — AMANTADINE HCL 100 MG PO CAPS
100.0000 mg | ORAL_CAPSULE | Freq: Every day | ORAL | Status: DC
  Filled 2019-01-16: qty 1

## 2019-01-16 NOTE — Progress Notes (Addendum)
NAMEShirita Wang, MRN:  [id number], DOB:  [birth year], LOS: 14 ADMISSION DATE:  01/02/2019, CONSULTATION DATE:  3/5 REFERRING MD:  Dr. Jerral Ralph, CHIEF COMPLAINT:  Status epilepticus   Brief History   68 year old female admitted with AMS found to have status epilepticus refractory to the AEDs and required transfer to ICU for intubation and continuous infusion.   Past Medical History   has a past medical history of High cholesterol, Hypertension, and Stroke (HCC).  Significant Hospital Events   3/5 admit to floor, then ICU transfer.  3/6 intubated and on burst suppression per Neurology 3/8 versed stopped. Blood cultures cleared.  3/9 propofol stopped.  No further seizures 3/12 now off sedation X 72 hours no sig improvement  3/13: Withdrawing to pain, otherwise no significant changes 3/14: Following commands intermittently 3/15: Much more awake, following commands still.  Opening eyes to request.  Starting pressure support trials 3/18: Still sedated, Following commands. Poor effort on SBT  Consults:  Neurology  Procedures:  ETT 3/6>> RUE PICC by IR 3/11>>>  Significant Diagnostic Tests:  CT head 3/5 > small vessel disease. No acute cortically based infarct or hemorrhage.  CT angio and perfusion 3/5 > negative large vessel occlusion. No core infarct or penumbra. Moderate to severe ICA stenosis. RUL pneumonia partially visible.  CT C-spine 3/5 > No cervical spine fracture or subluxation. Mild multilevel degenerative changes. EEG 3/5 >Continuous epileptiform discharge involving the right temporal region meeting the electrographic criteria for for status epilepticus CXR 01/04/19 > Patchy airspace disease on right side in RUL and RML, ETT appropriate position, interval placement of L IJ CVC MRI 01/07/19 > Chronic microvascular disease and atrophy but nil acute (personally reviewed) EEG 3/17 > R temporal sharp waves. T temporoparietal dysfunction   Micro Data:  Blood 3/5 > staph  epidermis Blood 3/6 >neg Tracheal aspirate 3/6 > NF  Antimicrobials:  Ceftriaxone 3/5  Flagyl 3/5  Unasyn 3/5 > 3/11   Interim history/subjective:  Weaning well on 10/5 this morning. Not following commands for me today.   Objective   Blood pressure 132/68, pulse 87, temperature 98.5 F (36.9 C), temperature source Axillary, resp. rate (!) 21, height 5' (1.524 m), weight 54.2 kg, SpO2 100 %.    Vent Mode: PRVC FiO2 (%):  [30 %] 30 % Set Rate:  [15 bmp] 15 bmp Vt Set:  [360 mL-380 mL] 380 mL PEEP:  [5 cmH20] 5 cmH20 Pressure Support:  [5 cmH20] 5 cmH20 Plateau Pressure:  [14 cmH20-16 cmH20] 14 cmH20   Intake/Output Summary (Last 24 hours) at 01/16/2019 0847 Last data filed at 01/16/2019 0700 Gross per 24 hour  Intake 1556.93 ml  Output 1200 ml  Net 356.93 ml   Filed Weights   01/11/19 0500 01/12/19 0500 01/14/19 0500  Weight: 56 kg 55.1 kg 54.2 kg   Physical Exam:   General: Elderly appearing female, difficult to arouse HEENT [place]/AT, PERRL, no appreciable JVD Pulmonary: Clear, good effort on SBT Cardiac: RRR, no MRG Abdomen: Soft, non-distended. Normoactive BS.  Extremities: Warm and dry Neuro: no following commands today.    Resolved Hospital Problem list   Hypokalemia Aspiration PNA Staph epidermis bacteremia  Assessment & Plan:   Status epilepticus  Acute encephalopathy Plan: Continuing AEDs per neuro (Vimpat, Keppra, phenytoin)  Supportive care Serial neuro checks  Need to discuss trach/peg with family. Neurology believes this to be an appropriate intervention should her family agree. Will be a months long recovery and likely not return to baseline.  Have left her a message to call in 3/19.  Acute hypoxemic respiratory insufficiency due to inability to protect airway. Plan: Continue pressure support ventilation as tolerated. Currently tolerating 10/5. In past has had periods of apnea causing backup ventilation to kick in.  PAD protocol RASS goal 0 Daily  assessment for extubation as mental status continues to improve  H/o Hypertension & Hyperlipidemia Plan: Continue home Diltiazem (but q 6 dosing)  Fluid and electrolyte imbalance: Hypernatremia, this is improved.  Hypokalemia Plan: Continue current free water replacement  Inadequate oral intake Plan: Continue tube feeds  Skin breakdown.  Worsening sacral ulceration due to frequent stools Plan: Continue routine wound care  Urinary retention - s/p in and out cath x 3. Plan: Maintain foley. DC 3/20 after 2 days urecholine  Urecholine   Best practice:  Diet: TF at goal Pain/Anxiety/Delirium protocol (if indicated): None VAP protocol (if indicated): Yes  DVT prophylaxis: enoxaparin  GI prophylaxis: PPI Glucose control: N/a Mobility: BR Code Status: Full Family Communication: Family meeting planned for today at 2pm.  Disposition: Continue ICU. Need to determine trach/PEG.   Critical care time: 30 mins    Joneen Roach, AGACNP-BC [facility] Pulmonary/Critical Care Pager [phone] or [phone]  01/16/2019 8:47 AM

## 2019-01-16 NOTE — Progress Notes (Signed)
LTM started; Dr Dimitriu notified. Family educated on event button.

## 2019-01-16 NOTE — Progress Notes (Signed)
Subjective: Continues to wax/wane  Exam: Vitals:   01/16/19 0900 01/16/19 1000  BP: 127/71 99/61  Pulse: 94 96  Resp: 19 (!) 21  Temp:    SpO2: 99% 98%   Gen: In bed, NAD Resp: non-labored breathing, no acute distress Abd: soft, nt  Neuro: MS: Awakens to mild stimuli, she engages with me an dfollows physical prompting to squeeze hand but does not clearly follow verbally today.  XE:NMMHW, has right gaze preference.  Motor: withdraws L to nox stim, moves right side well.  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  She was in burst suppression, and took quite some time to clear medications but now appears to be gradually improving.  She continues to have a right gaze preference and left hemiparesis, and I suspect this is due to her previous stroke.  It is possible she has had some degree of recurrent injury due to her status epilepticus.  She followe dcommands a single time for me, but has been waxing/waning.   Recommendations: 1) increase dilantin to 100-75-75 2) continue Vimpat 200 twice daily 3) continue Keppra 1500 twice daily 4) repeat overnight EEG  This patient is critically ill and at significant risk of neurological worsening, death and care requires constant monitoring of vital signs, hemodynamics,respiratory and cardiac monitoring, neurological assessment, discussion with family, other specialists and medical decision making of high complexity. I spent 35 minutes of neurocritical care time  in the care of  this patient. This was time spent independent of any time provided by nurse practitioner or PA.  Ritta Slot, MD [facility] [phone]  If 7pm- 7am, please page neurology on call as listed in AMION. 01/16/2019  11:40 AM

## 2019-01-17 LAB — CBC
HCT: 27.9 % — ABNORMAL LOW (ref 36.0–46.0)
HEMOGLOBIN: 8.8 g/dL — AB (ref 12.0–15.0)
MCH: 30.3 pg (ref 26.0–34.0)
MCHC: 31.5 g/dL (ref 30.0–36.0)
MCV: 96.2 fL (ref 80.0–100.0)
Platelets: 298 10*3/uL (ref 150–400)
RBC: 2.9 MIL/uL — ABNORMAL LOW (ref 3.87–5.11)
RDW: 15.3 % (ref 11.5–15.5)
WBC: 5.7 10*3/uL (ref 4.0–10.5)
nRBC: 0 % (ref 0.0–0.2)

## 2019-01-17 LAB — BASIC METABOLIC PANEL
Anion gap: 8 (ref 5–15)
BUN: 18 mg/dL (ref 8–23)
CO2: 28 mmol/L (ref 22–32)
Calcium: 8.6 mg/dL — ABNORMAL LOW (ref 8.9–10.3)
Chloride: 102 mmol/L (ref 98–111)
Creatinine, Ser: 0.51 mg/dL (ref 0.44–1.00)
GFR calc Af Amer: >60 mL/min (ref >60)
GFR calc non Af Amer: >60 mL/min (ref >60)
Glucose, Bld: 105 mg/dL — ABNORMAL HIGH (ref 70–99)
Potassium: 3.3 mmol/L — ABNORMAL LOW (ref 3.5–5.1)
Sodium: 138 mmol/L (ref 135–145)

## 2019-01-17 LAB — GLUCOSE, CAPILLARY
Glucose-Capillary: 91 mg/dL (ref 70–99)
Glucose-Capillary: 122 mg/dL — ABNORMAL HIGH (ref 70–99)
Glucose-Capillary: 137 mg/dL — ABNORMAL HIGH (ref 70–99)
Glucose-Capillary: 79 mg/dL (ref 70–99)
Glucose-Capillary: 102 mg/dL — ABNORMAL HIGH (ref 70–99)

## 2019-01-17 LAB — PHENYTOIN LEVEL, TOTAL: Phenytoin Lvl: 11 ug/mL (ref 10.0–20.0)

## 2019-01-17 MED ORDER — ETOMIDATE 2 MG/ML IV SOLN: 40.0000 mg | Freq: Once | INTRAVENOUS | Status: DC

## 2019-01-17 MED ORDER — MIDAZOLAM HCL 2 MG/2ML IJ SOLN: 5.0000 mg | Freq: Once | INTRAMUSCULAR | Status: DC

## 2019-01-17 MED ORDER — VECURONIUM BROMIDE 10 MG IV SOLR: 10.0000 mg | Freq: Once | INTRAVENOUS | Status: DC

## 2019-01-17 MED ORDER — POTASSIUM CHLORIDE 20 MEQ/15ML (10%) PO SOLN
40.0000 meq | Freq: Once | ORAL | Status: AC
  Administered 2019-01-17: 40 meq
  Filled 2019-01-17: qty 30

## 2019-01-17 MED ORDER — AMANTADINE HCL 50 MG/5ML PO SYRP
100.0000 mg | ORAL_SOLUTION | Freq: Every day | ORAL | Status: DC
  Administered 2019-01-17 – 2019-01-31 (×15): 100 mg
  Filled 2019-01-17 (×15): qty 10

## 2019-01-17 MED ORDER — PROPOFOL 10 MG/ML IV BOLUS: 500.0000 mg | Freq: Once | INTRAVENOUS | Status: DC

## 2019-01-17 MED ORDER — FENTANYL CITRATE (PF) 100 MCG/2ML IJ SOLN: 200.0000 ug | Freq: Once | INTRAMUSCULAR | Status: DC

## 2019-01-17 NOTE — Progress Notes (Signed)
Subjective: No seizures but she does have PLEDs  She was started on amantadine yesterday and today is the best that I have seen her thus far.  Exam: Vitals:   01/17/19 1300 01/17/19 1505  BP: 109/67 104/62  Pulse: 88 87  Resp: 15 15  Temp:  98.3 F (36.8 C)  SpO2: 98% 99%   Gen: In bed, NAD Resp: non-labored breathing, no acute distress Abd: soft, nt  Neuro: MS: Awakens to mild stimuli, she engages with me and follows command to squeeze fingers and [place] as well as wiggle toes bilaterally. NA:TFTDD, she crosses midline to the left, but still has a right gaze preference Motor: withdraws L to nox stim, moves right side well.  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  She was in burst suppression, and took quite some time to clear medications but now appears to be gradually improving.  She continues to have a right gaze preference and left hemiparesis, and I suspect this is due to her previous stroke.  It is possible she has had some degree of recurrent injury due to her status epilepticus.  She followe dcommands a single time for me, but has been waxing/waning.   Recommendations: 1) continue Dilantin 100-75-75 2) continue Vimpat 200 twice daily 3) continue Keppra 1500 twice daily 4) can discontinue EEG 5) continue amantadine  This patient is critically ill and at significant risk of neurological worsening, death and care requires constant monitoring of vital signs, hemodynamics,respiratory and cardiac monitoring, neurological assessment, discussion with family, other specialists and medical decision making of high complexity. I spent 40 minutes of neurocritical care time  in the care of  this patient. This was time spent independent of any time provided by nurse practitioner or PA.  Ritta Slot, MD [facility] [phone]  If 7pm-  7am, please page neurology on call as listed in AMION. 01/17/2019  4:52 PM

## 2019-01-17 NOTE — Procedures (Signed)
  CPT/Type of Study: [id number]; 12-26hr continuous EEG with video Recording dates: 01/16/19 @13 :33 to 01/17/19 @07 :00 Recording epoch: 1 Requesting provider: Amada Jupiter Interpreting physician: Delbert Phenix Dhiren Azimi, MD Indications for Procedure: Status epilepticus Primary neurological diagnosis: Epilepsy, stroke  History: This is a 68 year old patient, undergoing continuous EEG to evaluate for seizures.   EEG Details: Continuous video EEG was performed using standard setting per the guidelines of American Clinical Neurophysiology Society (ACNS). A minimum of 21 electrodes were placed on scalp according to the International 10-20 or 10-10 system. Supplemental electrodes were placed as needed. Single EKG electrode was also used to detect cardiac arrhythmia. Recording was performed at a sampling rate of at least 256 Hz. Patient's behavior was continuously recorded on video simultaneously with EEG. A minimum of 18 channels were used for data display. Each epoch of study was reviewed manually daily and as needed using standard digital review software allowing for montage reformatting, gain and filter changes on a display system of sufficient resolution to prevent aliasing. Computerized quantitative EEG analysis (such as compressed spectral array analysis, dipole analysis, trending, automated spike & seizure detection) was used as indicated.  Description of EEG features: State of patient: Awake and drowsy  Background Activity Overall Amplitude: very low amplitude (<20 V), symmetric  Predominant Frequency: A posterior dominant rhythm of 7-8 Hz is observed with arousals, which is slower and less well formed on the right Superimposed Frequencies: continuous medium amplitude (20-50 V) polymorphic delta>theta rhythms, lateralized to the right hemisphere Reactivity to stimulation: present Asymmetry: No  Breach rhythm: No  Sleep: No sleep is recorded.  Background abnormalities: No Periodic or rhythmic  abnormalities: Yes 1. Spiky LPDs, right hemisphere This pattern is frequent (~30% of the recording), long (5-59 minutes) in duration, with a frequency of about 1 / 2 seconds and has waveforms that are biphasic, with spiky morphology and medium-high amplitude (50-100 V).  Epileptiform discharges: Yes 1. Spikes, right frontal-temporal Maximum: F8>T4 Amount: from 1 / minute to 1 / 2 seconds as above Paroxysmal events or seizures: No Push button events: No  EKG: 80 bpm and regular  Impression: This continuous EEG is indicative of: 1. 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   2. A broader area of structural abnormality and/or cortical dysfunction in the right hemisphere  3. A mild to moderate diffuse encephalopathy, but non-specific as to etiology.   No seizures are seen.  Recommendations: Clinical correlation is required. Continue VEEG if needed to guide management.

## 2019-01-17 NOTE — Progress Notes (Signed)
NAMESyenna Wang, MRN:  [id number], DOB:  1951-03-22, LOS: 15 ADMISSION DATE:  01/02/2019, CONSULTATION DATE:  3/5 REFERRING MD:  Dr. Jerral Ralph, CHIEF COMPLAINT:  Status epilepticus   Brief History   68 year old female admitted with AMS found to have status epilepticus refractory to the AEDs and required transfer to ICU for intubation and continuous infusion.   Past Medical History   has a past medical history of High cholesterol, Hypertension, and Stroke (HCC).  Significant Hospital Events   3/5 admit to floor, then ICU transfer.  3/6 intubated and on burst suppression per Neurology 3/8 versed stopped. Blood cultures cleared.  3/9 propofol stopped.  No further seizures 3/12 now off sedation X 72 hours no sig improvement  3/13: Withdrawing to pain, otherwise no significant changes 3/14: Following commands intermittently 3/15: Much more awake, following commands still.  Opening eyes to request.  Starting pressure support trials 3/18: Still sedated, Following commands. Poor effort on SBT  Consults:  Neurology  Procedures:  ETT 3/6>> RUE PICC by IR 3/11>>>  Significant Diagnostic Tests:  CT head 3/5 > small vessel disease. No acute cortically based infarct or hemorrhage.  CT angio and perfusion 3/5 > negative large vessel occlusion. No core infarct or penumbra. Moderate to severe ICA stenosis. RUL pneumonia partially visible.  CT C-spine 3/5 > No cervical spine fracture or subluxation. Mild multilevel degenerative changes. EEG 3/5 >Continuous epileptiform discharge involving the right temporal region meeting the electrographic criteria for for status epilepticus CXR 01/04/19 > Patchy airspace disease on right side in RUL and RML, ETT appropriate position, interval placement of L IJ CVC MRI 01/07/19 > Chronic microvascular disease and atrophy but nil acute (personally reviewed) EEG 3/17 > R temporal sharp waves. T temporoparietal dysfunction   Micro Data:  Blood 3/5 > staph  epidermis Blood 3/6 >neg Tracheal aspirate 3/6 > NF  Antimicrobials:  Ceftriaxone 3/5  Flagyl 3/5  Unasyn 3/5 > 3/11   Interim history/subjective:  Episodes of apnea this morning on SBT.  Improved effort with stimulation.  Mental status continues to wax and wane but was able to follow commands and noted properly questions for neurology.  Objective   Blood pressure 109/67, pulse 86, temperature (!) 97.2 F (36.2 C), temperature source Axillary, resp. rate 15, height 5' (1.524 m), weight 57.6 kg, SpO2 100 %.    Vent Mode: PRVC FiO2 (%):  [30 %] 30 % Set Rate:  [15 bmp] 15 bmp Vt Set:  [360 mL] 360 mL PEEP:  [5 cmH20] 5 cmH20 Pressure Support:  [10 cmH20] 10 cmH20 Plateau Pressure:  [12 cmH20-14 cmH20] 13 cmH20   Intake/Output Summary (Last 24 hours) at 01/17/2019 1029 Last data filed at 01/17/2019 0700 Gross per 24 hour  Intake 1829.87 ml  Output 1180 ml  Net 649.87 ml   Filed Weights   01/12/19 0500 01/14/19 0500 01/17/19 0500  Weight: 55.1 kg 54.2 kg 57.6 kg   Physical Exam:   General: Elderly appearing female, difficult to arouse HEENT [place]/AT, PERRL, no appreciable JVD.  Very hard of hearing Pulmonary: Clear, good effort on SBT Cardiac: RRR, no MRG Abdomen: Soft, non-distended. Normoactive BS.  Extremities: Warm and dry Neuro: Tries to open eyes.  Follow commands weakly moves purposefully.   Resolved Hospital Problem list   Hypokalemia Aspiration PNA Staph epidermis bacteremia  Assessment & Plan:   Status epilepticus  Acute encephalopathy Plan: Continuing AEDs per neuro (Vimpat, Keppra, phenytoin)  Supportive care Serial neuro checks  Family has  agreed to trach tracheostomy  Acute hypoxemic respiratory insufficiency due to inability to protect airway. Plan: Continue pressure support ventilation as tolerated. Currently tolerating 10/5. In past has had periods of apnea causing backup ventilation to kick in.  PAD protocol RASS goal 0 Daily assessment for  extubation as mental status continues to improve  H/o Hypertension & Hyperlipidemia Plan: Continue home Diltiazem (but q 6 dosing)  Fluid and electrolyte imbalance: Hypernatremia, this is improved.  Hypokalemia Plan: Continue current free water replacement  Inadequate oral intake Plan: Continue tube feeds  Skin breakdown.  Worsening sacral ulceration due to frequent stools Plan: Continue routine wound care  Urinary retention - s/p in and out cath x 3. Plan: Maintain foley. DC 3/20 after 2 days urecholine  Urecholine   Best practice:  Diet: TF at goal Pain/Anxiety/Delirium protocol (if indicated): None VAP protocol (if indicated): Yes  DVT prophylaxis: enoxaparin  GI prophylaxis: PPI Glucose control: N/a Mobility: BR Code Status: Full Family Communication: Family meeting planned for today at 2pm.  Disposition: Continue ICU. Need to determine trach/PEG.   Critical care time: 32 min including chart data review, examination of patient, multidisciplinary rounds, and frequent assessment and modification of ventilator settings, coordination of care.Erika Catalan, MD [facility] ICU Physician [facility]  Pager: [phone] Mobile: [phone] After hours: [phone].  01/17/2019, 10:30 AM       01/17/2019 10:29 AM

## 2019-01-17 NOTE — Progress Notes (Signed)
MEDICATION RELATED CONSULT NOTE -  Pharmacy Consult for phenytoin Indication: seizure-like activity   Assessment: 68 year old female admitted 3/5 unresponsive with AMS. EEG (preliminary report) was positive for seizure activity so patient initiated on fosphenytoin load followed by phenytoin maintenance dosing. Patient now s/p burst suppression.  Phenytoin level this am 11; corrects to ~15.6 (therapeutic) based on albumin of 2.2.  Plan:  - Continue phenytoin to 100 mg qAM, 75 mg in the afternoon, 75 mg in the evening - Daily phenytoin level   [name] 01/17/2019 1:28 PM

## 2019-01-17 NOTE — Progress Notes (Signed)
LTM EEG discontinued. No skin breakdown at unhook. 

## 2019-01-18 LAB — GLUCOSE, CAPILLARY
GLUCOSE-CAPILLARY: 111 mg/dL — AB (ref 70–99)
Glucose-Capillary: 113 mg/dL — ABNORMAL HIGH (ref 70–99)
Glucose-Capillary: 114 mg/dL — ABNORMAL HIGH (ref 70–99)
Glucose-Capillary: 115 mg/dL — ABNORMAL HIGH (ref 70–99)
Glucose-Capillary: 117 mg/dL — ABNORMAL HIGH (ref 70–99)
Glucose-Capillary: 121 mg/dL — ABNORMAL HIGH (ref 70–99)
Glucose-Capillary: 124 mg/dL — ABNORMAL HIGH (ref 70–99)

## 2019-01-18 LAB — PHENYTOIN LEVEL, TOTAL: Phenytoin Lvl: 10.9 ug/mL (ref 10.0–20.0)

## 2019-01-18 MED ORDER — POTASSIUM CHLORIDE 20 MEQ/15ML (10%) PO SOLN
40.0000 meq | Freq: Once | ORAL | Status: AC
  Administered 2019-01-18: 40 meq
  Filled 2019-01-18: qty 30

## 2019-01-18 NOTE — Progress Notes (Signed)
PULMONARY / CRITICAL CARE MEDICINE   NAME:  Stephenie Larabee, MRN:  [id number], DOB:  12-26-1950, LOS: 16 ADMISSION DATE:  01/02/2019, CONSULTATION DATE:  01/02/2019 REFERRING MD:  Jerral Ralph, CHIEF COMPLAINT:  Status epilepticus  BRIEF HISTORY:    This is a 68 year old female admitted with AMS found to be in status epilepticus refractory to the AEDs and required transfer to ICU for intubation and continuous infusion. She was intubated on 3/6 and burst suppression. Weaning trials have been with PSV but the patient has had occasional apnea episodes, which have precluded progression to extubation. 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  3/6 intubated and on burst suppression per Neurology 3/8 versed stopped. Blood cultures cleared.  3/9 propofol stopped.  No further seizures 3/12 now off sedation X 72 hours no sig improvement  3/13: Withdrawing to pain, otherwise no significant changes 3/14: Following commands intermittently 3/15: Much more awake, following commands still.  Opening eyes to request.  Starting pressure support trials 3/18: Still sedated, Following commands. Poor effort on SBT STUDIES:   CT head 3/5 > small vessel disease. No acute cortically based infarct or hemorrhage.  CT angio and perfusion 3/5 > negative large vessel occlusion. No core infarct or penumbra. Moderate to severe ICA stenosis. RUL pneumonia partially visible.  CT C-spine 3/5 > No cervical spine fracture or subluxation. Mild multilevel degenerative changes. EEG 3/5 >Continuous epileptiform discharge involving the right temporal region meeting theelectrographic criteria for for status epilepticus CXR 01/04/19 > Patchy airspace disease on right side in RUL and RML, ETT appropriate position, interval placement of L IJ CVC MRI 01/07/19 > Chronic microvascular disease and atrophy but nil acute  (personally reviewed) EEG 3/17 > R temporal sharp waves. T temporoparietal dysfunction  CULTURES:  Blood 3/5 > staph epidermis Blood 3/6 >neg Tracheal aspirate 3/6 > NF  ANTIBIOTICS:  Ceftriaxone 3/5  Flagyl 3/5  Unasyn 3/5 > 3/11   LINES/TUBES:   ETT 3/6>> RUE PICC by IR 3/11>>> CONSULTANTS:  Neurol SUBJECTIVE:  Sedated  CONSTITUTIONAL: BP 114/70   Pulse 87   Temp 98.8 F (37.1 C) (Axillary)   Resp 18   Ht 5' (1.524 m)   Wt 57.6 kg   SpO2 100%   BMI 24.80 kg/m   I/O last 3 completed shifts: In: 1160.2 [I.V.:370.2; NG/GT:790] Out: 1975 [Urine:1975]     Vent Mode: PRVC FiO2 (%):  [30 %] 30 % Set Rate:  [15 bmp] 15 bmp Vt Set:  [360 mL] 360 mL PEEP:  [5 cmH20] 5 cmH20 Plateau Pressure:  [12 cmH20-16 cmH20] 14 cmH20  PHYSICAL EXAM: General:  WD/WD NARD on vent Neuro:  Resists eye opening. Withdraws to noxious stimuli HEENT:  /AT; PRRL; ETT/OGT in place Cardiovascular:  RRR, nom/r/g Lungs:  Clear bilaterally Abdomen:  Supple, no guarding, +BS Musculoskeletal:  LEs in SCDs and boots. No active joints in UEs Skin:  No rashes  RESOLVED PROBLEM LIST  Hypokalemia Aspiration PNA Staph epidermis bacteremia ASSESSMENT AND PLAN   1. Status epilepticus. Management per Neurol  2. Acute hypoxemic respiratory insufficiency. Continue PSV as tol.   PAD protocol RASS goal 0 Daily assessment for extubation as mental status continues to improve  3. Hypokalemia. K+ today 3.3. Will give dose KCL per OGT.  4. GI/nutrition. Continue Vital TF and Protonix while on vent.  SUMMARY OF TODAY'S PLAN:  As above  Best Practice /  Goals of Care / Disposition.   DVT PROPHYLAXIS:Lovenox/SCD  NUTRITION:Vital TF MOBILITY:BR DISPOSITION ICU  LABS  Glucose Recent Labs  Lab 01/17/19 1557 01/17/19 1957 01/18/19 0012 01/18/19 0321 01/18/19 0818 01/18/19 1151  GLUCAP 79 102* 115* 113* 114* 121*    BMET Recent Labs  Lab 01/15/19 0608 01/16/19 0611 01/17/19 0515   NA 139 138 138  K 3.9 3.6 3.3*  CL 100 101 102  CO2 30 28 28   BUN 23 20 18   CREATININE 0.60 0.55 0.51  GLUCOSE 97 127* 105*    Liver Enzymes Recent Labs  Lab 01/12/19 0539 01/14/19 0429  AST 37 26  ALT 58* 37  ALKPHOS 69 73  BILITOT 0.4 0.4  ALBUMIN 2.3* 2.2*    Electrolytes Recent Labs  Lab 01/14/19 0429 01/15/19 0608 01/16/19 0611 01/17/19 0515  CALCIUM 8.9 9.3 9.1 8.6*  MG 1.9 1.9  --   --   PHOS 2.7 2.6  --   --     CBC Recent Labs  Lab 01/15/19 0608 01/16/19 0611 01/17/19 0515  WBC 6.8 7.6 5.7  HGB 9.4* 9.7* 8.8*  HCT 29.3* 30.9* 27.9*  PLT 270 320 298    ABG No results for input(s): PHART, PCO2ART, PO2ART in the last 168 hours.  Coag's No results for input(s): APTT, INR in the last 168 hours.  Sepsis Markers No results for input(s): LATICACIDVEN, PROCALCITON, O2SATVEN in the last 168 hours.  Cardiac Enzymes No results for input(s): TROPONINI, PROBNP in the last 168 hours.  PAST MEDICAL HISTORY :   She  has a past medical history of High cholesterol, Hypertension, and Stroke (HCC).  PAST SURGICAL HISTORY:  She  has a past surgical history that includes Cesarean section.  Allergies  Allergen Reactions  . Ace Inhibitors Other (See Comments)    Acute renal failure    No current facility-administered medications on file prior to encounter.    Current Outpatient Medications on File Prior to Encounter  Medication Sig  . aspirin EC 81 MG tablet Take 81 mg by mouth 2 (two) times daily.   Marland Kitchen atorvastatin (LIPITOR) 10 MG tablet Take 10 mg by mouth daily.  Marland Kitchen diltiazem (CARDIZEM CD) 180 MG 24 hr capsule Take 180 mg by mouth 2 (two) times daily.    FAMILY HISTORY:   Her family history includes Hypertension in her father and mother.  SOCIAL HISTORY:  She  reports that she quit smoking about 5 years ago. She has never used smokeless tobacco. She reports that she does not drink alcohol or use drugs.   Critical care time 35 min

## 2019-01-19 DIAGNOSIS — L8990 Pressure ulcer of unspecified site, unspecified stage: Secondary | ICD-10-CM

## 2019-01-19 LAB — GLUCOSE, CAPILLARY
GLUCOSE-CAPILLARY: 145 mg/dL — AB (ref 70–99)
GLUCOSE-CAPILLARY: 144 mg/dL — AB (ref 70–99)
Glucose-Capillary: 185 mg/dL — ABNORMAL HIGH (ref 70–99)
Glucose-Capillary: 131 mg/dL — ABNORMAL HIGH (ref 70–99)
Glucose-Capillary: 104 mg/dL — ABNORMAL HIGH (ref 70–99)
Glucose-Capillary: 119 mg/dL — ABNORMAL HIGH (ref 70–99)

## 2019-01-19 LAB — PHENYTOIN LEVEL, TOTAL: Phenytoin Lvl: 11.9 ug/mL (ref 10.0–20.0)

## 2019-01-19 MED ORDER — MIDAZOLAM HCL 2 MG/2ML IJ SOLN: 5.0000 mg | Freq: Once | INTRAMUSCULAR | Status: DC

## 2019-01-19 MED ORDER — PROPOFOL 10 MG/ML IV BOLUS: 500.0000 mg | Freq: Once | INTRAVENOUS | Status: DC

## 2019-01-19 MED ORDER — ETOMIDATE 2 MG/ML IV SOLN
40.0000 mg | Freq: Once | INTRAVENOUS | Status: AC
  Administered 2019-01-20: 40 mg via INTRAVENOUS
  Filled 2019-01-19: qty 20

## 2019-01-19 MED ORDER — MIDAZOLAM HCL 2 MG/2ML IJ SOLN
5.0000 mg | Freq: Once | INTRAMUSCULAR | Status: AC
  Administered 2019-01-20: 4 mg via INTRAVENOUS
  Filled 2019-01-19: qty 6

## 2019-01-19 MED ORDER — ETOMIDATE 2 MG/ML IV SOLN: 40.0000 mg | Freq: Once | INTRAVENOUS | Status: DC

## 2019-01-19 MED ORDER — VECURONIUM BROMIDE 10 MG IV SOLR
10.0000 mg | Freq: Once | INTRAVENOUS | Status: AC
  Administered 2019-01-20: 10 mg via INTRAVENOUS
  Filled 2019-01-19: qty 10

## 2019-01-19 MED ORDER — VECURONIUM BROMIDE 10 MG IV SOLR: 10.0000 mg | Freq: Once | INTRAVENOUS | Status: DC

## 2019-01-19 MED ORDER — FENTANYL CITRATE (PF) 100 MCG/2ML IJ SOLN
200.0000 ug | Freq: Once | INTRAMUSCULAR | Status: AC
  Administered 2019-01-20: 200 ug via INTRAVENOUS
  Filled 2019-01-19: qty 4

## 2019-01-19 NOTE — Progress Notes (Addendum)
PULMONARY / CRITICAL CARE MEDICINE   NAME:  Erika Wang, MRN:  [id number], DOB:  [birth year], LOS: 17 ADMISSION DATE:  01/02/2019, CONSULTATION DATE:  01/17/2019 REFERRING MD:  Jerral Ralph, CHIEF COMPLAINT:  Status epilepticus  BRIEF HISTORY:    This is a 68 year old female admitted with AMS found to be in status epilepticus refractory to the AEDs and required transfer to ICU for intubation and continuous infusion. She was intubated on 3/6 and burst suppression. Weaning trials have been with PSV but the patient has had occasional apnea episodes, which have precluded progression to extubation.  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  3/6 intubated and on burst suppression per Neurology 3/8 versed stopped. Blood cultures cleared.  3/9 propofol stopped. No further seizures 3/12 now off sedation X 72 hours no sig improvement  3/13: Withdrawing to pain, otherwise no significant changes 3/14: Following commands intermittently 3/15: Much more awake, following commands still. Opening eyes to request. Starting pressure support trials 3/18: Still sedated, Following commands. Poor effort on SBT STUDIES:   CT head 3/5>small vessel disease. No acute cortically based infarct or hemorrhage.  CT angio and perfusion 3/5>negative large vessel occlusion. No core infarct or penumbra. Moderate to severe ICA stenosis. RUL pneumonia partially visible.  CT C-spine 3/5 > No cervical spine fracture or subluxation. Mild multilevel degenerative changes. EEG 3/5 >Continuous epileptiform discharge involving the right temporal region meeting theelectrographic criteria for for status epilepticus CXR 01/04/19>Patchy airspace disease on right side in RUL and RML, ETT appropriate position, interval placement of L IJ CVC MRI 01/07/19>Chronic microvascular disease and atrophy but nil  acute (personally reviewed) EEG 3/17>R temporal sharp waves. T temporoparietal dysfunction  CULTURES:  Blood 3/5 > staph epidermis Blood 3/6 >neg Tracheal aspirate 3/6 > NG  ANTIBIOTICS:  Ceftriaxone 3/5  Flagyl 3/5  Unasyn 3/5 >3/11   LINES/TUBES:   ETT 3/6>> RUE PICC by IR 3/11>>> CONSULTANTS:  Neurol SUBJECTIVE:  Unresponsive to sternal rub but resists forced eye opening  CONSTITUTIONAL: BP 118/66   Pulse (!) 104   Temp (!) 100.5 F (38.1 C) (Axillary)   Resp 12   Ht 5' (1.524 m)   Wt 56.9 kg   SpO2 98%   BMI 24.50 kg/m   I/O last 3 completed shifts: In: 390.2 [I.V.:390.2] Out: 2500 [Urine:2500]     Vent Mode: CPAP;PSV FiO2 (%):  [30 %] 30 % Set Rate:  [15 bmp] 15 bmp Vt Set:  [360 mL] 360 mL PEEP:  [5 cmH20] 5 cmH20 Pressure Support:  [15 cmH20] 15 cmH20 Plateau Pressure:  [15 cmH20] 15 cmH20  PHYSICAL EXAM: General:  WD/WN in NARD on PSV 10 Neuro:  Resists eye opening. Does not withdraw to noxious stimuli HEENT:  [place]/AT PRRL ETT/OGT Cardiovascular:  RRR, no m/r/g Lungs:  Clear bilaterally Abdomen:  Supple, no guarding Musculoskeletal:  LEs in SCDs and boots. No active joints in UEs Skin:  No rashes  RESOLVED PROBLEM LIST  Hypokalemia Aspiration PNA Staph epidermis bacteremia ASSESSMENT AND PLAN   1. Status epilepticus. Management per Neurol  2. Acute hypoxemic respiratory insufficiency. Continue PSV as tol.   PAD protocol RASS goal 0 With continued depressed mental status likely will need a trach  3. Hypokalemia. Check BMP in AM  4. GI/nutrition. Continue Vital TF and Protonix while on vent.  SUMMARY OF TODAY'S PLAN:  As above  Best Practice /  Goals of Care / Disposition.   DVT PROPHYLAXIS:Lovenox/[place] NUTRITION:Vital TF MOBILITY:BR  LABS  Glucose Recent Labs  Lab 01/18/19 1600 01/18/19 1953 01/18/19 2326 01/19/19 0330 01/19/19 0802 01/19/19 1129  GLUCAP 111* 117* 124* 145* 144* 185*    BMET Recent Labs  Lab  01/15/19 0608 01/16/19 0611 01/17/19 0515  NA 139 138 138  K 3.9 3.6 3.3*  CL 100 101 102  CO2 30 28 28   BUN 23 20 18   CREATININE 0.60 0.55 0.51  GLUCOSE 97 127* 105*    Liver Enzymes Recent Labs  Lab 01/14/19 0429  AST 26  ALT 37  ALKPHOS 73  BILITOT 0.4  ALBUMIN 2.2*    Electrolytes Recent Labs  Lab 01/14/19 0429 01/15/19 0608 01/16/19 0611 01/17/19 0515  CALCIUM 8.9 9.3 9.1 8.6*  MG 1.9 1.9  --   --   PHOS 2.7 2.6  --   --     CBC Recent Labs  Lab 01/15/19 0608 01/16/19 0611 01/17/19 0515  WBC 6.8 7.6 5.7  HGB 9.4* 9.7* 8.8*  HCT 29.3* 30.9* 27.9*  PLT 270 320 298    ABG No results for input(s): PHART, PCO2ART, PO2ART in the last 168 hours.  Coag's No results for input(s): APTT, INR in the last 168 hours.  Sepsis Markers No results for input(s): LATICACIDVEN, PROCALCITON, O2SATVEN in the last 168 hours.  Cardiac Enzymes No results for input(s): TROPONINI, PROBNP in the last 168 hours.  PAST MEDICAL HISTORY :   She  has a past medical history of High cholesterol, Hypertension, and Stroke (HCC).  PAST SURGICAL HISTORY:  She  has a past surgical history that includes Cesarean section.  Allergies  Allergen Reactions  . Ace Inhibitors Other (See Comments)    Acute renal failure    No current facility-administered medications on file prior to encounter.    Current Outpatient Medications on File Prior to Encounter  Medication Sig  . aspirin EC 81 MG tablet Take 81 mg by mouth 2 (two) times daily.   Marland Kitchen atorvastatin (LIPITOR) 10 MG tablet Take 10 mg by mouth daily.  Marland Kitchen diltiazem (CARDIZEM CD) 180 MG 24 hr capsule Take 180 mg by mouth 2 (two) times daily.    FAMILY HISTORY:   Her family history includes Hypertension in her father and mother.  SOCIAL HISTORY:  She  reports that she quit smoking about 5 years ago. She has never used smokeless tobacco. She reports that she does not drink alcohol or use drugs.   Critical care time: 30 min

## 2019-01-19 NOTE — Progress Notes (Signed)
Subjective: She is tearful on my exam today, does not cooperate.  Exam: Vitals:   01/19/19 1500 01/19/19 1600  BP:    Pulse:    Resp:    Temp:  98.9 F (37.2 C)  SpO2: 100%    Gen: In bed, NAD Resp: non-labored breathing, no acute distress Abd: soft, nt  Neuro: MS: Awakens to mild stimuli, she engages with me and follows command to squeeze fingers and Lico as well as wiggle toes bilaterally. OI:BBCWU, she crosses midline to the left, but still has a right gaze preference Motor: withdraws L to nox stim, moves right side well.  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  She was in burst suppression, and took quite some time to clear medications but now appears to be gradually improving.  She continues to have a right gaze preference and left hemiparesis, and I suspect this is due to her previous stroke.  At this point, I suspect that she has had significant injury due to her status epilepticus, but she may have gradual improvement as after her stroke.  Treatment from this point forward is mainly supportive, and I would expect gradual recovery as after a stroke over the next few months if it is going to occur.  Recommendations: 1) continue Dilantin 100-75-75 2) continue Vimpat 200 twice daily 3) continue Keppra 1500 twice daily 4) continue amantadine 5) can back off on daily Dilantin levels given that they have been fairly consistent, and I will defer to pharmacy for dosing this moving forward. 6) at this point, since care is essentially supportive from this point forward, neurology will be available on an as-needed basis.  If there are further questions or concerns, please do not hesitate to call.  Ritta Slot, MD [facility] [phone]  If 7pm- 7am, please page neurology on call as listed in AMION. 01/19/2019  6:20 PM

## 2019-01-20 ENCOUNTER — Inpatient Hospital Stay (HOSPITAL_COMMUNITY): Payer: Medicare HMO

## 2019-01-20 LAB — BASIC METABOLIC PANEL
ANION GAP: 7 (ref 5–15)
BUN: 22 mg/dL (ref 8–23)
CALCIUM: 8.6 mg/dL — AB (ref 8.9–10.3)
CO2: 28 mmol/L (ref 22–32)
Chloride: 102 mmol/L (ref 98–111)
Creatinine, Ser: 0.63 mg/dL (ref 0.44–1.00)
GFR calc Af Amer: >60 mL/min (ref >60)
GFR calc non Af Amer: >60 mL/min (ref >60)
Glucose, Bld: 107 mg/dL — ABNORMAL HIGH (ref 70–99)
Potassium: 3.7 mmol/L (ref 3.5–5.1)
Sodium: 137 mmol/L (ref 135–145)

## 2019-01-20 LAB — PROTIME-INR
INR: 1.1 (ref 0.8–1.2)
Prothrombin Time: 13.9 seconds (ref 11.4–15.2)

## 2019-01-20 LAB — GLUCOSE, CAPILLARY
Glucose-Capillary: 101 mg/dL — ABNORMAL HIGH (ref 70–99)
Glucose-Capillary: 103 mg/dL — ABNORMAL HIGH (ref 70–99)
Glucose-Capillary: 104 mg/dL — ABNORMAL HIGH (ref 70–99)
Glucose-Capillary: 106 mg/dL — ABNORMAL HIGH (ref 70–99)
Glucose-Capillary: 128 mg/dL — ABNORMAL HIGH (ref 70–99)
Glucose-Capillary: 130 mg/dL — ABNORMAL HIGH (ref 70–99)

## 2019-01-20 LAB — PHENYTOIN LEVEL, TOTAL: Phenytoin Lvl: 10.7 ug/mL (ref 10.0–20.0)

## 2019-01-20 LAB — CBC
HCT: 28.5 % — ABNORMAL LOW (ref 36.0–46.0)
Hemoglobin: 8.7 g/dL — ABNORMAL LOW (ref 12.0–15.0)
MCH: 29.2 pg (ref 26.0–34.0)
MCHC: 30.5 g/dL (ref 30.0–36.0)
MCV: 95.6 fL (ref 80.0–100.0)
Platelets: 306 10*3/uL (ref 150–400)
RBC: 2.98 MIL/uL — ABNORMAL LOW (ref 3.87–5.11)
RDW: 15.1 % (ref 11.5–15.5)
WBC: 6.5 10*3/uL (ref 4.0–10.5)
nRBC: 0 % (ref 0.0–0.2)

## 2019-01-20 LAB — APTT: aPTT: 36 seconds (ref 24–36)

## 2019-01-20 NOTE — Procedures (Signed)
Bedside Tracheostomy Insertion Procedure Note   Patient Details:   Name: Erika Wang DOB: 12-24-1950 MRN: [id number]  Procedure: Tracheostomy  Pre Procedure Assessment: ET Tube Size: 7.5 ET Tube secured at lip (cm): 21 Bite block in place: No Breath Sounds: Clear  Post Procedure Assessment: BP (!) 156/94   Pulse 85   Temp 98.4 F (36.9 C) (Axillary)   Resp 15   Ht 5' (1.524 m)   Wt 59.5 kg   SpO2 99%   BMI 25.62 kg/m  O2 sats: stable throughout Complications: No apparent complications Patient did tolerate procedure well Tracheostomy Brand:Shiley Tracheostomy Style:Cuffed Tracheostomy Size: 6 Tracheostomy Secured [id number], velcro Tracheostomy Placement Confirmation:Trach cuff visualized and in place and Chest X ray ordered for placement    Jacqulynn Cadet 01/20/2019, 4:07 PM

## 2019-01-20 NOTE — Consult Note (Signed)
WOC Nurse wound consult note Reason for Consult: re-consulted for same area between the anus and vagina.  Reassessed the sacral wound while I was looking at area of concern. 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 Cover with moist dressing. On Progressa bed while in the ICU, turning and repositioning as scheduled.    WOC Nurse team will follow with you and see patient within 10 days for wound assessments.  Please notify WOC nurses of any acute changes in the wounds or any new areas of concern Jamell Opfer [facility] MSN, RN,CWOCN, CNS, CWON-AP [phone]

## 2019-01-20 NOTE — Care Management (Signed)
Noted tracheostomy performed today.  MD: please consider CM consult for  LTAC referral.    Quintella Baton, RN, BSN  Trauma/Neuro ICU Case Manager [phone]

## 2019-01-20 NOTE — Procedures (Signed)
Percutaneous Tracheostomy Placement  Consent from family.  Patient sedated, paralyzed and position.  Placed on 100% FiO2 and RR matched.  Area cleaned and draped.  Lidocaine/epi injected.  Skin incision done followed by blunt dissection.  Trachea palpated then punctured, catheter passed and visualized bronchoscopically.  Wire placed and visualized.  Catheter removed.  Airway then entered and dilated.  Size 6 cuffed shiley trach placed and visualized bronchoscopically well above carina.  Good volume returns.  Patient tolerated the procedure well without complications.  Minimal blood loss.  CXR ordered and pending.  Lajuan Godbee G. Luie Laneve, M.D. [facility]. Pager: [phone]. After hours pager: [phone].  

## 2019-01-20 NOTE — Progress Notes (Signed)
PULMONARY / CRITICAL CARE MEDICINE   NAME:  Erika Wang, MRN:  [id number], DOB:  [birth year], LOS: 18 ADMISSION DATE:  01/02/2019, CONSULTATION DATE:  01/17/2019 REFERRING MD:  Jerral Ralph, CHIEF COMPLAINT:  Status epilepticus  BRIEF HISTORY:    This is a 68 year old female admitted with AMS found to be in status epilepticus refractory to the AEDs and required transfer to ICU for intubation and continuous infusion. She was intubated on 3/6 and burst suppression. Weaning trials have been with PSV but the patient has had occasional apnea episodes, which have precluded progression to extubation.  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  3/6 intubated and on burst suppression per Neurology 3/8 versed stopped. Blood cultures cleared.  3/9 propofol stopped. No further seizures 3/12 now off sedation X 72 hours no sig improvement  3/13: Withdrawing to pain, otherwise no significant changes 3/14: Following commands intermittently 3/15: Much more awake, following commands still. Opening eyes to request. Starting pressure support trials 3/18: Still sedated, Following commands. Poor effort on SBT 3/22:  Tolerated weaning throughout day  STUDIES:   CT head 3/5>small vessel disease. No acute cortically based infarct or hemorrhage.  CT angio and perfusion 3/5>negative large vessel occlusion. No core infarct or penumbra. Moderate to severe ICA stenosis. RUL pneumonia partially visible.  CT C-spine 3/5 > No cervical spine fracture or subluxation. Mild multilevel degenerative changes. EEG 3/5 >Continuous epileptiform discharge involving the right temporal region meeting theelectrographic criteria for for status epilepticus CXR 01/04/19>Patchy airspace disease on right side in RUL and RML, ETT appropriate position, interval placement of L IJ CVC MRI 01/07/19>Chronic  microvascular disease and atrophy but nil acute (personally reviewed) EEG 3/17>R temporal sharp waves. T temporoparietal dysfunction  CULTURES:  Blood 3/5 > staph epidermis Blood 3/6 >neg Tracheal aspirate 3/6 > NG  ANTIBIOTICS:  Ceftriaxone 3/5  Flagyl 3/5  Unasyn 3/5 >3/11   LINES/TUBES:   ETT 3/6>> RUE PICC by IR 3/11>>> CONSULTANTS:  Neurol SUBJECTIVE:  Hard of hearing. Exam is unchanged.   CONSTITUTIONAL: BP (!) 98/59   Pulse 86   Temp 98.7 F (37.1 C) (Axillary)   Resp 15   Ht 5' (1.524 m)   Wt 59.5 kg   SpO2 100%   BMI 25.62 kg/m   I/O last 3 completed shifts: In: 1249 [I.V.:384; NG/GT:865] Out: 1550 [Urine:1550]     Vent Mode: CPAP;PSV FiO2 (%):  [30 %] 30 % Set Rate:  [15 bmp] 15 bmp Vt Set:  [0.36 mL-360 mL] 0.36 mL PEEP:  [5 cmH20] 5 cmH20 Pressure Support:  [10 cmH20-15 cmH20] 10 cmH20 Plateau Pressure:  [13 cmH20-14 cmH20] 13 cmH20  PHYSICAL EXAM: General:  WD/WN in NARD on PSV 10 Neuro:  Opens eyes to command. Not moving limbs to command today.  HEENT:  [place]/AT PRRL ETT/OGT Cardiovascular:  RRR, no m/r/g Lungs:  Clear bilaterally Abdomen:  Supple, no guarding Musculoskeletal:  LEs in SCDs and boots. No active joints in UEs Skin:  Sacral pressure wound. Tears along labia and posterior fourchette along track of Purewick.  RESOLVED PROBLEM LIST  Hypokalemia Aspiration PNA Staph epidermis bacteremia ASSESSMENT AND PLAN    Critically ill due to respiratory failure requiring mechanical ventilation. Has tolerated prolonged PSV, thus should be able to proceed to trach collar following tracheostomy. Prolonged encephalopathy following status epilepticus - residual effects of sedative infusions should have cleared.  Prior CVA  may be affecting mental status. No clear structural injury from status on MRI. Limited response to amantadine. AED toxicity may be contributing. No clear seizures on repeat EEG.  Plan:  Tracheostomy today Trach collar  trial Cortrak Will discuss reduction in AED burden with Neurology.  SUMMARY OF TODAY'S PLAN:  As above  Best Practice / Goals of Care / Disposition.    Diet: PepUp protocol. Pain/Anxiety/Delirium protocol (if indicated): no sedation. VAP protocol (if indicated): bundle in place. DVT prophylaxis: Lovenox GI prophylaxis: Pantoprazole Urinary catheter: required due to skin breakdown. Glucose control: Euglycemic with on insulin requirements. Mobility: tolerating bedrest only  Code Status: full code. Family Communication: Last updated daughter on 3/19 Disposition: ICU. Once tolerating trach collar >48h, can transfer to PCU.   LABS  Glucose Recent Labs  Lab 01/19/19 1129 01/19/19 1554 01/19/19 1938 01/19/19 2312 01/20/19 0327 01/20/19 0753  GLUCAP 185* 131* 104* 119* 106* 101*    BMET Recent Labs  Lab 01/16/19 0611 01/17/19 0515 01/20/19 0600  NA 138 138 137  K 3.6 3.3* 3.7  CL 101 102 102  CO2 28 28 28   BUN 20 18 22   CREATININE 0.55 0.51 0.63  GLUCOSE 127* 105* 107*    Liver Enzymes Recent Labs  Lab 01/14/19 0429  AST 26  ALT 37  ALKPHOS 73  BILITOT 0.4  ALBUMIN 2.2*    Electrolytes Recent Labs  Lab 01/14/19 0429 01/15/19 0608 01/16/19 0611 01/17/19 0515 01/20/19 0600  CALCIUM 8.9 9.3 9.1 8.6* 8.6*  MG 1.9 1.9  --   --   --   PHOS 2.7 2.6  --   --   --     CBC Recent Labs  Lab 01/16/19 0611 01/17/19 0515 01/20/19 0600  WBC 7.6 5.7 6.5  HGB 9.7* 8.8* 8.7*  HCT 30.9* 27.9* 28.5*  PLT 320 298 306    ABG No results for input(s): PHART, PCO2ART, PO2ART in the last 168 hours.  Coag's Recent Labs  Lab 01/20/19 0600  APTT 36  INR 1.1    Sepsis Markers No results for input(s): LATICACIDVEN, PROCALCITON, O2SATVEN in the last 168 hours.  Cardiac Enzymes No results for input(s): TROPONINI, PROBNP in the last 168 hours.  PAST MEDICAL HISTORY :   She  has a past medical history of High cholesterol, Hypertension, and Stroke  (HCC).  PAST SURGICAL HISTORY:  She  has a past surgical history that includes Cesarean section.  Allergies  Allergen Reactions  . Ace Inhibitors Other (See Comments)    Acute renal failure    No current facility-administered medications on file prior to encounter.    Current Outpatient Medications on File Prior to Encounter  Medication Sig  . aspirin EC 81 MG tablet Take 81 mg by mouth 2 (two) times daily.   Marland Kitchen atorvastatin (LIPITOR) 10 MG tablet Take 10 mg by mouth daily.  Marland Kitchen diltiazem (CARDIZEM CD) 180 MG 24 hr capsule Take 180 mg by mouth 2 (two) times daily.   Critical Care time: 35 min including chart data review, examination of patient, multidisciplinary rounds, and frequent assessment and modification of ventilator settings and coordination of care.  Lynnell Catalan, MD [facility] ICU Physician [facility]  Pager: [phone] Mobile: [phone] After hours: [phone].  01/20/2019, 9:37 AM

## 2019-01-20 NOTE — Progress Notes (Signed)
MEDICATION RELATED CONSULT NOTE -  Pharmacy Consult for phenytoin Indication: seizure-like activity   Assessment: 68 year old female admitted 3/5 unresponsive with AMS. EEG (preliminary report) was positive for seizure activity so patient initiated on fosphenytoin load followed by phenytoin maintenance dosing. Patient now s/p burst suppression.  Phenytoin level this am 10.7; corrects to ~15.7 (therapeutic) based on albumin of 2.2.  Plan:  - Continue phenytoin to 100 mg qAM, 75 mg in the afternoon, 75 mg in the evening - Monitor levels as needed - Monitor for side effects   Fayne Norrie 01/20/2019 10:15 AM

## 2019-01-20 NOTE — Progress Notes (Signed)
Nutrition Follow-up  DOCUMENTATION CODES:   Not applicable  INTERVENTION:   Continue tube feeding:  -Vital AF 1.2 @ 30 ml/hr via OGT (720 ml) -30 ml Prostat TID -Free water flushes 200 ml QID per MD  Provides: 1164 kcals, 99 grams protein, 584 ml free water (1384 ml with flushes).   NUTRITION DIAGNOSIS:   Inadequate oral intake related to inability to eat(pt sedated and ventilated ) as evidenced by NPO status.  Ongoing  GOAL:   Patient will meet greater than or equal to 90% of their needs  Met with TF  MONITOR:   Vent status, TF tolerance, Weight trends, Labs, Skin  REASON FOR ASSESSMENT:   Ventilator    ASSESSMENT:   67 y.o. 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 Pt found to have PNA with sepsis. Pt now intubated s/p seizure.     Pt discussed during ICU rounds and with RN.   3/23 Cortrak placed (tip in stomach), plan for trach this afternoon  Patient is currently intubated on ventilator support MV: 6.8 L/min Temp (24hrs), Avg:98.4 F (36.9 C), Min:98 F (36.7 C), Max:98.9 F (37.2 C)  I/O: -3,027 ml since admit UOP: 900 ml x 24 hrs   Medications reviewed and include: SS novolog, liquid MVI 200 ml free water every 6 hours Labs reviewed  Diet Order:   Diet Order            Diet NPO time specified  Diet effective midnight              EDUCATION NEEDS:   No education needs have been identified at this time  Skin:  Skin Assessment: (MASD: groin/perineum) Skin Integrity Issues:: Stage II Stage II: vagina - device related PI  Other: pressure injury sacrum  Last BM:  3/22  Height:   Ht Readings from Last 1 Encounters:  01/02/19 5' (1.524 m)    Weight:   Wt Readings from Last 1 Encounters:  01/20/19 59.5 kg    Ideal Body Weight:  45.4 kg  BMI:  Body mass index is 25.62  kg/m.  Estimated Nutritional Needs:   Kcal:  1129 kcal  Protein:  80-105 grams  Fluid:  >/= 1.5 L/day  Maylon Peppers RD, LDN, CNSC [phone] Pager [phone] After Hours Pager

## 2019-01-20 NOTE — Procedures (Signed)
Bronchoscopy Procedure Note Maudine Mcaskill [id number] Jul 10, 1951  Procedure: Bronchoscopy Indications: Tracheostomy placement  Procedure Details Consent: Risks of procedure as well as the alternatives and risks of each were explained to the (patient/caregiver).  Consent for procedure obtained. Time Out: Verified patient identification, verified procedure, site/side was marked, verified correct patient position, special equipment/implants available, medications/allergies/relevent history reviewed, required imaging and test results available.  Performed  In preparation for procedure, patient was given 100% FiO2 and bronchoscope lubricated. Sedation: Benzodiazepines, Muscle relaxants and Etomidate  Airway entered and the following bronchi were examined: Bronchi.   Procedures performed: Brushings performed Bronchoscope removed.  , Patient placed back on 100% FiO2 at conclusion of procedure.    Evaluation Hemodynamic Status: Transient hypertension requiring treatment with sedating medications; O2 sats: stable throughout Patient's Current Condition: stable Specimens:  None Complications: No apparent complications Patient did tolerate procedure well.   Lanier Clam 01/20/2019

## 2019-01-20 NOTE — Procedures (Signed)
Cortrak  Person Inserting Tube:  Helane Rima E, RD Tube Type:  Cortrak - 43 inches Tube Location:  Right nare Initial Placement:  Stomach Secured by: Clip Technique Used to Measure Tube Placement:  Documented cm marking at nare/ corner of mouth Cortrak Secured At:  73 cm    Cortrak Tube Team Note:  Consult received to place a Cortrak feeding tube.   No x-ray is required. RN may begin using tube.   If the tube becomes dislodged please keep the tube and contact the Cortrak team at [email or web address] (password [email or web address]) for replacement.  If after hours and replacement cannot be delayed, place a NG tube and confirm placement with an abdominal x-ray.   Helane Rima, MS, RD, LDN Office: [phone] Pager: [phone] After Hours/Weekend Pager: [phone]

## 2019-01-21 LAB — GLUCOSE, CAPILLARY
Glucose-Capillary: 110 mg/dL — ABNORMAL HIGH (ref 70–99)
Glucose-Capillary: 112 mg/dL — ABNORMAL HIGH (ref 70–99)
Glucose-Capillary: 136 mg/dL — ABNORMAL HIGH (ref 70–99)
Glucose-Capillary: 114 mg/dL — ABNORMAL HIGH (ref 70–99)
Glucose-Capillary: 122 mg/dL — ABNORMAL HIGH (ref 70–99)

## 2019-01-21 LAB — PHENYTOIN LEVEL, TOTAL: PHENYTOIN LVL: 9.3 ug/mL — AB (ref 10.0–20.0)

## 2019-01-21 MED ORDER — TOPIRAMATE 25 MG PO TABS
50.0000 mg | ORAL_TABLET | Freq: Two times a day (BID) | ORAL | Status: DC
  Administered 2019-01-22 – 2019-01-25 (×7): 50 mg via ORAL
  Filled 2019-01-21 (×7): qty 2

## 2019-01-21 MED ORDER — TOPIRAMATE 100 MG PO TABS: 100.0000 mg | ORAL_TABLET | Freq: Two times a day (BID) | ORAL | Status: DC

## 2019-01-21 MED ORDER — TOPIRAMATE 25 MG PO TABS: 75.0000 mg | ORAL_TABLET | Freq: Two times a day (BID) | ORAL | Status: DC

## 2019-01-21 NOTE — Progress Notes (Signed)
SLP Cancellation Note  Patient Details Name: Vaanya Alvord MRN: [id number] DOB: 08-07-1951   Cancelled treatment:       Reason Eval/Treat Not Completed: Medical issues which prohibited therapy. Orders received for PMV. Trach placed yesterday, apneic during attempts to wean. Per MD note, sat on edge bed with PT, followed commands. FiO2 30, PEEP 5. Appears appropriate to initiate inline PMV. ST will initiate as able.    Royce Macadamia 01/21/2019, 10:03 AM  Breck Coons Lonell Face.Ed Nurse, children's [phone] Office [phone]

## 2019-01-21 NOTE — Care Management (Signed)
Spoke with pt's daughter, [name], to discuss LTAC; offered choice for [facility] hospital, and she prefers [facility].  Notified [facility] admissions liaison, Lia Hopping of new referral; will begin insurance authorization, which may take 3-5 days.  Will provide updates as available.    Of note, daughter states pt is HOH, especially on left side.    Quintella Baton, RN, BSN  Trauma/Neuro ICU Case Manager [phone]

## 2019-01-21 NOTE — Evaluation (Signed)
Physical Therapy Evaluation Patient Details Name: Erika Wang MRN: [id number] DOB: [birth year] Today's Date: 01/21/2019   History of Present Illness   Erika Wang is a 68 y.o. 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 EEG found patient to have seizure like activity. Pt remains unresponsive, intubated on 3/6 and now has a tracheostomy on 3/23.  Clinical Impression  Pt admitted with above. Pt with no eye opening and minimal active participation. Pt did follow "give me a thumbs up" consistently with the L hand but not the R. Pt actively moving the R hand by not the L. Pt remains on the vent with the tracheostomy. Pt dependent for all mobility at this time. Acute PT to cont to follow to progress mobility as able.    Follow Up Recommendations LTACH    Equipment Recommendations  None recommended by PT    Recommendations for Other Services       Precautions / Restrictions Precautions Precautions: Fall;Other (comment) Precaution Comments: pt with trach and on vent Restrictions Weight Bearing Restrictions: No      Mobility  Bed Mobility Overal bed mobility: Needs Assistance Bed Mobility: Supine to Sit;Sit to Supine     Supine to sit: Total assist;+2 for physical assistance;HOB elevated Sit to supine: Total assist;+2 for physical assistance   General bed mobility comments: pt wit no initiation of task, completely dependent for mobility  Transfers                 General transfer comment: not tested due to lethargy  Ambulation/Gait                Stairs            Wheelchair Mobility    Modified Rankin (Stroke Patients Only) Modified Rankin (Stroke Patients Only) Pre-Morbid Rankin Score: No symptoms Modified Rankin: Severe disability     Balance Overall balance assessment: Needs  assistance Sitting-balance support: Feet supported;No upper extremity supported Sitting balance-Leahy Scale: Zero Sitting balance - Comments: pt dependent on physical assist, Pt with L lateral lean when not given support and no protective reflex to pull self back to midline                                     Pertinent Vitals/Pain Pain Assessment: Faces Faces Pain Scale: No hurt    Home Living Family/patient expects to be discharged to:: Other (Comment)                 Additional Comments: pt was at home indep. now will most likely go to [facility]    Prior Function Level of Independence: Independent         Comments: PLOF unclear, pulled form chart at pt is unresponsive and non-verbal     Hand Dominance   Dominant Hand: Right    Extremity/Trunk Assessment   Upper Extremity Assessment Upper Extremity Assessment: LUE deficits/detail;RUE deficits/detail RUE Deficits / Details: pt using R UE to reach for NG tube but not moving it to command LUE Deficits / Details: pt with no voluntary movement with exception of giving a thumbs up, PROM [facility]    Lower Extremity Assessment Lower Extremity Assessment: RLE deficits/detail;LLE deficits/detail RLE Deficits / Details: pt with no voluntary movement, PROM WFL LLE Deficits / Details: pt with no  voluntary mvmt, patellar reflex intact, PROM WFL    Cervical / Trunk Assessment Cervical / Trunk Assessment: Other exceptions Cervical / Trunk Exceptions: tracheostomy  Communication   Communication: Expressive difficulties;HOH  Cognition Arousal/Alertness: Lethargic Behavior During Therapy: Flat affect Overall Cognitive Status: Impaired/Different from baseline Area of Impairment: Following commands                       Following Commands: Follows one step commands with increased time;Follows one step commands inconsistently(only followed "give me a thumbs up" consistently)       General Comments: pt with  no eye opening, no initiation of task, pt with grimace and movement of R UE to noxious stimuli x4 extremities      General Comments General comments (skin integrity, edema, etc.): VSS, RN present to watch/address trach/vent    Exercises     Assessment/Plan    PT Assessment Patient needs continued PT services  PT Problem List Decreased strength;Decreased range of motion;Decreased activity tolerance;Decreased balance;Decreased mobility;Decreased coordination;Decreased cognition;Decreased knowledge of use of DME;Decreased safety awareness;Decreased knowledge of precautions       PT Treatment Interventions Functional mobility training;Therapeutic activities;Therapeutic exercise;Balance training;Neuromuscular re-education;Cognitive remediation;Patient/family education;Gait training;DME instruction;Stair training    PT Goals (Current goals can be found in the Care Plan section)  Acute Rehab PT Goals Patient Stated Goal: didn't state PT Goal Formulation: Patient unable to participate in goal setting Time For Goal Achievement: 02/04/19 Potential to Achieve Goals: Good    Frequency Min 2X/week   Barriers to discharge Decreased caregiver support i believe patient lived alone    Co-evaluation               AM-PAC PT "6 Clicks" Mobility  Outcome Measure Help needed turning from your back to your side while in a flat bed without using bedrails?: Total Help needed moving from lying on your back to sitting on the side of a flat bed without using bedrails?: Total Help needed moving to and from a bed to a chair (including a wheelchair)?: Total Help needed standing up from a chair using your arms (e.g., wheelchair or bedside chair)?: Total Help needed to walk in hospital room?: Total Help needed climbing 3-5 steps with a railing? : Total 6 Click Score: 6    End of Session Equipment Utilized During Treatment: Oxygen Activity Tolerance: Patient tolerated treatment well Patient left:  in bed;with call bell/phone within reach;with nursing/sitter in room(in chair position) Nurse Communication: Mobility status PT Visit Diagnosis: Muscle weakness (generalized) (M62.81);Difficulty in walking, not elsewhere classified (R26.2)    Time: 1537-9432 PT Time Calculation (min) (ACUTE ONLY): 16 min   Charges:   PT Evaluation $PT Eval Moderate Complexity: 1 Mod          Lewis Shock, PT, DPT Acute Rehabilitation Services Pager #: [phone] Office #: [phone]   Iona Hansen 01/21/2019, 10:42 AM

## 2019-01-21 NOTE — Progress Notes (Signed)
Subjective: She is slightly better today  Exam: Vitals:   01/21/19 1600 01/21/19 1602  BP: 121/76   Pulse: 92   Resp: 15   Temp:    SpO2: 100% 100%   Gen: In bed, NAD Resp: non-labored breathing, no acute distress Abd: soft, nt  Neuro: MS: Awakens to mild stimuli, she engages with me and follows command to squeeze fingers  as well as wiggle toes bilaterally. SV:XBLTJ, she sometimes will slightly cross midlien to theleft, but still has a right gaze preference Motor: withdraws L to nox stim, moves right side well.  Sensory:intact to LT  Impression: 68 yo F with h/o stroke who presented with refractory status epilepticus secondary to previous stroke.  She was in burst suppression, and took quite some time to clear medications but now appears to be gradually improving.  She continues to have a right gaze preference and left hemiparesis, and I suspect this is due to her previous stroke.  At this point, I suspect that she has had significant injury due to her status epilepticus, but she may have gradual improvement as after her stroke.  Treatment from this point forward is mainly supportive, and I would expect gradual recovery as after a stroke over the next few months if it is going to occur.  One consideration would bethat different AEDs may be better tolerated. Also, her EEG, though no seizures,wsa still fairly active. I would favor adding topiramate slowly and once on a theraputic dose, weaning keppra.    Recommendations: 1) continue Dilantin 100-75-75 2) continue Vimpat 200 twice daily 3) continue Keppra 1500 twice daily 4) continue amantadine 5) start topiramate 50mg  BID for one week, then 75mg  BID for one oweek, then 100mg  BID x1 week. Then topirmate level and begin weaning down on keppra by 500mg /dose every3 - 5 days to off.  6) with interaction with dilantin, will need weekly dilantin levels while titrating up on topiramate.  7) if topiramate level is still low on 04/10, will  need this titrated up.  8) Please call with any questions.   Ritta Slot, MD [facility] [phone]  If 7pm- 7am, please page neurology on call as listed in AMION. 01/21/2019  4:21 PM

## 2019-01-21 NOTE — Progress Notes (Signed)
Erika Wang, MRN:  [id number], DOB:  1950/12/21, LOS: 19 ADMISSION DATE:  01/02/2019, CONSULTATION DATE:  01/02/2019 REFERRING MD:  TRH, CHIEF COMPLAINT:  Status epilepticus.   HPI/course in hospital  68 year old woman with AMS due to status epilepticus. Required high dose sedative infusions to gain control. Prolonged encephalopathy since then, slow to awake and follow commands.  Started on amantadine with limited improvement. Percutaneous tracheostomy placed 3/23.  Past Medical History   Past Medical History:  Diagnosis Date  . High cholesterol   . Hypertension   . Stroke [facility])      Past Surgical History:  Procedure Laterality Date  . CESAREAN SECTION       Interim history/subjective:  Failed to wean due to apnea today. Did follow commands for RN, sat on edge of bed with PT.  Objective   Blood pressure 118/71, pulse 88, temperature 98.7 F (37.1 C), temperature source Axillary, resp. rate 17, height 5' (1.524 m), weight 53.7 kg, SpO2 99 %.    Vent Mode: PRVC FiO2 (%):  [30 %] 30 % Set Rate:  [15 bmp] 15 bmp Vt Set:  [360 mL] 360 mL PEEP:  [5 cmH20] 5 cmH20 Plateau Pressure:  [14 cmH20-16 cmH20] 14 cmH20   Intake/Output Summary (Last 24 hours) at 01/21/2019 0919 Last data filed at 01/21/2019 0700 Gross per 24 hour  Intake 1486.77 ml  Output 500 ml  Net 986.77 ml   Filed Weights   01/19/19 0500 01/20/19 0500 01/21/19 0500  Weight: 56.9 kg 59.5 kg 53.7 kg    Examination: Physical Exam Constitutional:      Appearance: She is ill-appearing.  HENT:     Head: Normocephalic and atraumatic.     Nose:     Comments: Cortrak tube in place with no ulceration.    Mouth/Throat:     Mouth: Mucous membranes are moist.  Neck:     Comments: Tracheostomy in place. Cardiovascular:     Pulses: Normal pulses.     Heart sounds: Normal heart sounds.  Pulmonary:     Breath sounds: Normal breath sounds.     Comments: No asynchrony. Not breathing over ventilator, apneic  spells on PSV Abdominal:     Palpations: Abdomen is soft.  Genitourinary:    Comments: Foley in place due to labial ulceration from Purewick Skin:    General: Skin is warm and dry.  Neurological:     Mental Status: She is lethargic.     GCS: GCS eye subscore is 4. GCS verbal subscore is 1. GCS motor subscore is 6.     Cranial Nerves: Facial asymmetry present.     Motor: Weakness (weak on left side) present.     Comments: Delayed motor responses.      Ancillary tests (personally reviewed)  CBC: Recent Labs  Lab 01/15/19 0608 01/16/19 0611 01/17/19 0515 01/20/19 0600  WBC 6.8 7.6 5.7 6.5  HGB 9.4* 9.7* 8.8* 8.7*  HCT 29.3* 30.9* 27.9* 28.5*  MCV 96.1 95.7 96.2 95.6  PLT 270 320 298 306    Basic Metabolic Panel: Recent Labs  Lab 01/15/19 0608 01/16/19 0611 01/17/19 0515 01/20/19 0600  NA 139 138 138 137  K 3.9 3.6 3.3* 3.7  CL 100 101 102 102  CO2 30 28 28 28   GLUCOSE 97 127* 105* 107*  BUN 23 20 18 22   CREATININE 0.60 0.55 0.51 0.63  CALCIUM 9.3 9.1 8.6* 8.6*  MG 1.9  --   --   --  PHOS 2.6  --   --   --    GFR: Estimated Creatinine Clearance: 49.7 mL/min (by C-G formula based on SCr of 0.63 mg/dL). Recent Labs  Lab 01/15/19 0608 01/16/19 0611 01/17/19 0515 01/20/19 0600  WBC 6.8 7.6 5.7 6.5    Liver Function Tests: No results for input(s): AST, ALT, ALKPHOS, BILITOT, PROT, ALBUMIN in the last 168 hours. No results for input(s): LIPASE, AMYLASE in the last 168 hours. No results for input(s): AMMONIA in the last 168 hours.  ABG    Component Value Date/Time   PHART 7.380 01/03/2019 0051   PCO2ART 44.8 01/03/2019 0051   PO2ART 432.0 (H) 01/03/2019 0051   HCO3 26.6 01/03/2019 0051   TCO2 28 01/03/2019 0051   O2SAT 100.0 01/03/2019 0051     Coagulation Profile: Recent Labs  Lab 01/20/19 0600  INR 1.1    Cardiac Enzymes: No results for input(s): CKTOTAL, CKMB, CKMBINDEX, TROPONINI in the last 168 hours.  HbA1C: Hgb A1c MFr Bld   Date/Time Value Ref Range Status  01/03/2019 02:39 AM 5.9 (H) 4.8 - 5.6 % Final    Comment:    (NOTE) Pre diabetes:          5.7%-6.4% Diabetes:              >6.4% Glycemic control for   <7.0% adults with diabetes   09/29/2013 04:15 AM 5.9 (H) <5.7 % Final    Comment:    (NOTE)                                                                       According to the ADA Clinical Practice Recommendations for 2011, when HbA1c is used as a screening test:  >=6.5%   Diagnostic of Diabetes Mellitus           (if abnormal result is confirmed) 5.7-6.4%   Increased risk of developing Diabetes Mellitus References:Diagnosis and Classification of Diabetes Mellitus,Diabetes Care,2011,34(Suppl 1):S62-S69 and Standards of Medical Care in         Diabetes - 2011,Diabetes Care,2011,34 (Suppl 1):S11-S61.    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 Recovery will take weeks to months. Seizure disorder on anticonvulsants - Neurology to review in light of ongoing encephalopathy.  Prior CVA with left-sided weakness.  PLAN:  Continue daily trach collar trials Apnea likely indicates some degree of brain injury.  AED's per Neurology.  Best practice:  Diet: PepUp Pain/Anxiety/Delirium protocol (if indicated): none required VAP protocol (if indicated): bundle in place. DVT prophylaxis: Lovenox GI prophylaxis: Pantoprazole Urinary catheter: required for skin breakdown Glucose control: Phase 1.  Mobility: Bedrest Code Status: Full Family Communication: Daughter last updated prior to tracheostomy. Disposition: Ready for LTAC placement   Critical  care time: 35 min including chart data review, examination of patient, multidisciplinary rounds, and frequent assessment and modification of ventilator settings.   Lynnell Catalanavi Tolbert Matheson, MD [facility] ICU Physician [facility]  Pager:  [phone] Mobile: [phone] After hours: [phone].  01/21/2019, 9:19 AM

## 2019-01-22 LAB — GLUCOSE, CAPILLARY
GLUCOSE-CAPILLARY: 88 mg/dL (ref 70–99)
Glucose-Capillary: 102 mg/dL — ABNORMAL HIGH (ref 70–99)
Glucose-Capillary: 107 mg/dL — ABNORMAL HIGH (ref 70–99)
Glucose-Capillary: 111 mg/dL — ABNORMAL HIGH (ref 70–99)
Glucose-Capillary: 121 mg/dL — ABNORMAL HIGH (ref 70–99)
Glucose-Capillary: 123 mg/dL — ABNORMAL HIGH (ref 70–99)
Glucose-Capillary: 158 mg/dL — ABNORMAL HIGH (ref 70–99)

## 2019-01-22 LAB — BASIC METABOLIC PANEL
Anion gap: 9 (ref 5–15)
BUN: 13 mg/dL (ref 8–23)
CHLORIDE: 101 mmol/L (ref 98–111)
CO2: 27 mmol/L (ref 22–32)
Calcium: 8.5 mg/dL — ABNORMAL LOW (ref 8.9–10.3)
Creatinine, Ser: 0.55 mg/dL (ref 0.44–1.00)
GFR calc Af Amer: >60 mL/min (ref >60)
GFR calc non Af Amer: >60 mL/min (ref >60)
Glucose, Bld: 123 mg/dL — ABNORMAL HIGH (ref 70–99)
Potassium: 3.4 mmol/L — ABNORMAL LOW (ref 3.5–5.1)
Sodium: 137 mmol/L (ref 135–145)

## 2019-01-22 LAB — CBC
HEMATOCRIT: 27.3 % — AB (ref 36.0–46.0)
HEMOGLOBIN: 8.4 g/dL — AB (ref 12.0–15.0)
MCH: 29 pg (ref 26.0–34.0)
MCHC: 30.8 g/dL (ref 30.0–36.0)
MCV: 94.1 fL (ref 80.0–100.0)
Platelets: 343 10*3/uL (ref 150–400)
RBC: 2.9 MIL/uL — ABNORMAL LOW (ref 3.87–5.11)
RDW: 15.2 % (ref 11.5–15.5)
WBC: 5.2 10*3/uL (ref 4.0–10.5)
nRBC: 0 % (ref 0.0–0.2)

## 2019-01-22 NOTE — Evaluation (Signed)
Passy-Muir Speaking Valve - Evaluation Patient Details  Name: France Powe MRN: [id number] Date of Birth: 05-01-1951  Today's Date: 01/22/2019 Time: 1520-1550 SLP Time Calculation (min) (ACUTE ONLY): 30 min  Past Medical History:  Past Medical History:  Diagnosis Date  . High cholesterol   . Hypertension   . Stroke [facility])    Past Surgical History:  Past Surgical History:  Procedure Laterality Date  . CESAREAN SECTION     HPI:  Patient is a 68 y.o. female with PMH: high cholesterol, HTN, CVA in 2014 with residual mild lef-sided weakness, dyslipidemia, who was brought to ED after being found unresponsive on the floor. EEG found seizure like activity. Patient was intubated on 3/6 and currently has tracheostomy (placed 3/23) with #6 cuffed Shiley.    Assessment / Plan / Recommendation Clinical Impression  Patient seen for PMV readiness secondary to tracheostomy. Cuff had already been deflated of majority of air, however SLP removed remaining air in cuff and patient then started to cough, (not audible) and expectorate secretions at level of trach only. When silently coughing, patient's respiratory rate increased to 35 and as high as 44. RN assisted with tracheal suctioning without reduction in coughing or change in respirations. During finger occlusion of trach, patient's respiratory rate would decline but observed work of breathing increased. Patient did not attempt to phonate. She did follow commands to open eyes and open mouth, but did not move tongue when asked (yelling loudly into right ear secondary to hard of hearing and virtually no hearing in left ear). Although patient's respiratory rate did stabalize in range of 28-32, SLP did not attempt to place PMV secondary to respiratory rate increasing frequently when patient coughing and took several minutes to return to more stable range each time. Recommendation is for PMV use with SLP only at this time.  SLP Visit Diagnosis: Aphonia (R49.1)     SLP Assessment  Patient needs continued Speech Lanaguage Pathology Services    Follow Up Recommendations  Other (comment)(TBD)    Frequency and Duration min 3x week  2 weeks    PMSV Trial PMSV was placed for: not placed, finger occlusion attempted only   Tracheostomy Tube  Additional Tracheostomy Tube Assessment Level of Secretion Expectoration: Tracheal    Vent Dependency  FiO2 (%): 28 %    Cuff Deflation Trial  GO Tolerated Cuff Deflation: Yes        Angela Nevin, MA, CCC-SLP 01/22/19 4:25 PM

## 2019-01-22 NOTE — Progress Notes (Signed)
NAMESumie Wang, MRN:  [id number], DOB:  05-31-1951, LOS: 20 ADMISSION DATE:  01/02/2019, CONSULTATION DATE:  01/02/2019 REFERRING MD:  TRH, CHIEF COMPLAINT:  Status epilepticus.   HPI/course in hospital  68 year old woman with AMS due to status epilepticus. Required high dose sedative infusions to gain control. Prolonged encephalopathy since then, slow to awake and follow commands.  Started on amantadine with limited improvement. Percutaneous tracheostomy placed 3/23.  Past Medical History   Past Medical History:  Diagnosis Date  . High cholesterol   . Hypertension   . Stroke [facility])      Past Surgical History:  Procedure Laterality Date  . CESAREAN SECTION       Interim history/subjective:  Remains only partially responsive. Was able to follow commands on the right side for RN  Objective   Blood pressure 125/66, pulse 86, temperature 98.5 F (36.9 C), temperature source Axillary, resp. rate 15, height 5' (1.524 m), weight 57.9 kg, SpO2 100 %.    Vent Mode: PRVC FiO2 (%):  [30 %] 30 % Set Rate:  [15 bmp] 15 bmp Vt Set:  [360 mL-630 mL] 630 mL PEEP:  [5 cmH20] 5 cmH20 Pressure Support:  [10 cmH20] 10 cmH20 Plateau Pressure:  [14 cmH20-18 cmH20] 15 cmH20   Intake/Output Summary (Last 24 hours) at 01/22/2019 1108 Last data filed at 01/22/2019 0800 Gross per 24 hour  Intake 1920.71 ml  Output 930 ml  Net 990.71 ml   Filed Weights   01/20/19 0500 01/21/19 0500 01/22/19 0500  Weight: 59.5 kg 53.7 kg 57.9 kg    Examination: Physical Exam Constitutional:      Appearance: She is ill-appearing.  HENT:     Head: Normocephalic and atraumatic.     Nose:     Comments: Cortrak tube in place with no ulceration.    Mouth/Throat:     Mouth: Mucous membranes are moist.  Neck:     Comments: Tracheostomy in place. Cardiovascular:     Pulses: Normal pulses.     Heart sounds: Normal heart sounds.  Pulmonary:     Breath sounds: Normal breath sounds.     Comments: No  asynchrony. No apneic spells on PSV. Abdominal:     Palpations: Abdomen is soft.  Genitourinary:    Comments: Foley in place due to labial ulceration from Purewick Skin:    General: Skin is warm and dry.  Neurological:     Mental Status: She is lethargic.     GCS: GCS eye subscore is 4. GCS verbal subscore is 1. GCS motor subscore is 6.     Cranial Nerves: Facial asymmetry present.     Motor: Weakness (weak on left side) present.     Comments: Delayed motor responses.  Did not follow commands for me but made appropriate facial grimaces and appeared to mouth words with noxious stimulation      Ancillary tests (personally reviewed)  CBC: Recent Labs  Lab 01/16/19 0611 01/17/19 0515 01/20/19 0600  WBC 7.6 5.7 6.5  HGB 9.7* 8.8* 8.7*  HCT 30.9* 27.9* 28.5*  MCV 95.7 96.2 95.6  PLT 320 298 306    Basic Metabolic Panel: Recent Labs  Lab 01/16/19 0611 01/17/19 0515 01/20/19 0600  NA 138 138 137  K 3.6 3.3* 3.7  CL 101 102 102  CO2 28 28 28   GLUCOSE 127* 105* 107*  BUN 20 18 22   CREATININE 0.55 0.51 0.63  CALCIUM 9.1 8.6* 8.6*   GFR: Estimated Creatinine Clearance: 55.1  mL/min (by C-G formula based on SCr of 0.63 mg/dL). Recent Labs  Lab 01/16/19 0611 01/17/19 0515 01/20/19 0600  WBC 7.6 5.7 6.5    Liver Function Tests: No results for input(s): AST, ALT, ALKPHOS, BILITOT, PROT, ALBUMIN in the last 168 hours. No results for input(s): LIPASE, AMYLASE in the last 168 hours. No results for input(s): AMMONIA in the last 168 hours.  ABG    Component Value Date/Time   PHART 7.380 01/03/2019 0051   PCO2ART 44.8 01/03/2019 0051   PO2ART 432.0 (H) 01/03/2019 0051   HCO3 26.6 01/03/2019 0051   TCO2 28 01/03/2019 0051   O2SAT 100.0 01/03/2019 0051     Coagulation Profile: Recent Labs  Lab 01/20/19 0600  INR 1.1    Cardiac Enzymes: No results for input(s): CKTOTAL, CKMB, CKMBINDEX, TROPONINI in the last 168 hours.  HbA1C: Hgb A1c MFr Bld  Date/Time Value  Ref Range Status  01/03/2019 02:39 AM 5.9 (H) 4.8 - 5.6 % Final    Comment:    (NOTE) Pre diabetes:          5.7%-6.4% Diabetes:              >6.4% Glycemic control for   <7.0% adults with diabetes   09/29/2013 04:15 AM 5.9 (H) <5.7 % Final    Comment:    (NOTE)                                                                       According to the ADA Clinical Practice Recommendations for 2011, when HbA1c is used as a screening test:  >=6.5%   Diagnostic of Diabetes Mellitus           (if abnormal result is confirmed) 5.7-6.4%   Increased risk of developing Diabetes Mellitus References:Diagnosis and Classification of Diabetes Mellitus,Diabetes Care,2011,34(Suppl 1):S62-S69 and Standards of Medical Care in         Diabetes - 2011,Diabetes Care,2011,34 (Suppl 1):S11-S61.    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 Recovery will take weeks to months. Seizure disorder on anticonvulsants - Neurology to review in light of ongoing encephalopathy.  Prior CVA with left-sided weakness.  PLAN:  Continue PSV daily and trach collar trials AED's per Neurology.  Best practice:  Diet: PepUp Pain/Anxiety/Delirium protocol (if indicated): none required VAP protocol (if indicated): bundle in place. DVT prophylaxis: Lovenox GI prophylaxis: Pantoprazole Urinary catheter: required for skin breakdown Glucose control: Phase 1.  Mobility: Bedrest Code Status: Full Family Communication: Daughter last updated prior to tracheostomy. Disposition: Ready for LTAC placement   Critical care time: 35 min including chart data review, examination of  patient, multidisciplinary rounds, and frequent assessment and modification of ventilator settings.   Lynnell Catalan, MD [facility] ICU Physician [facility] Critical Care  Pager: [phone] Mobile: [phone] After hours: [phone].  01/22/2019, 11:08 AM

## 2019-01-22 NOTE — Progress Notes (Deleted)
NAMEKaleisha Wang, MRN:  [id number], DOB:  05/22/1951, LOS: 20 ADMISSION DATE:  01/02/2019, CONSULTATION DATE:  01/02/2019 REFERRING MD:  TRH, CHIEF COMPLAINT:  Status epilepticus.   HPI/course in hospital  68 year old woman with AMS due to status epilepticus. Required high dose sedative infusions to gain control. Prolonged encephalopathy since then, slow to awake and follow commands.  Started on amantadine with limited improvement. Percutaneous tracheostomy placed 3/23.  Past Medical History   Past Medical History:  Diagnosis Date  . High cholesterol   . Hypertension   . Stroke [facility])      Past Surgical History:  Procedure Laterality Date  . CESAREAN SECTION       Interim history/subjective:  Remains only partially responsive. Was able to follow commands on the right side for RN  Objective   Blood pressure 125/66, pulse 86, temperature 98.5 F (36.9 C), temperature source Axillary, resp. rate 15, height 5' (1.524 m), weight 57.9 kg, SpO2 100 %.    Vent Mode: PRVC FiO2 (%):  [30 %] 30 % Set Rate:  [15 bmp] 15 bmp Vt Set:  [360 mL-630 mL] 630 mL PEEP:  [5 cmH20] 5 cmH20 Pressure Support:  [10 cmH20] 10 cmH20 Plateau Pressure:  [14 cmH20-18 cmH20] 15 cmH20   Intake/Output Summary (Last 24 hours) at 01/22/2019 1102 Last data filed at 01/22/2019 0800 Gross per 24 hour  Intake 1920.71 ml  Output 930 ml  Net 990.71 ml   Filed Weights   01/20/19 0500 01/21/19 0500 01/22/19 0500  Weight: 59.5 kg 53.7 kg 57.9 kg    Examination: Physical Exam Constitutional:      Appearance: She is ill-appearing.  HENT:     Head: Normocephalic and atraumatic.     Nose:     Comments: Cortrak tube in place with no ulceration.    Mouth/Throat:     Mouth: Mucous membranes are moist.  Neck:     Comments: Tracheostomy in place. Cardiovascular:     Pulses: Normal pulses.     Heart sounds: Normal heart sounds.  Pulmonary:     Breath sounds: Normal breath sounds.     Comments: No  asynchrony. No apneic spells on PSV. Abdominal:     Palpations: Abdomen is soft.  Genitourinary:    Comments: Foley in place due to labial ulceration from Purewick Skin:    General: Skin is warm and dry.  Neurological:     Mental Status: She is lethargic.     GCS: GCS eye subscore is 4. GCS verbal subscore is 1. GCS motor subscore is 6.     Cranial Nerves: Facial asymmetry present.     Motor: Weakness (weak on left side) present.     Comments: Delayed motor responses.  Did not follow commands for me but made appropriate facial grimaces and appeared to mouth words with noxious stimulation      Ancillary tests (personally reviewed)  CBC: Recent Labs  Lab 01/16/19 0611 01/17/19 0515 01/20/19 0600  WBC 7.6 5.7 6.5  HGB 9.7* 8.8* 8.7*  HCT 30.9* 27.9* 28.5*  MCV 95.7 96.2 95.6  PLT 320 298 306    Basic Metabolic Panel: Recent Labs  Lab 01/16/19 0611 01/17/19 0515 01/20/19 0600  NA 138 138 137  K 3.6 3.3* 3.7  CL 101 102 102  CO2 28 28 28   GLUCOSE 127* 105* 107*  BUN 20 18 22   CREATININE 0.55 0.51 0.63  CALCIUM 9.1 8.6* 8.6*   GFR: Estimated Creatinine Clearance: 55.1  mL/min (by C-G formula based on SCr of 0.63 mg/dL). Recent Labs  Lab 01/16/19 0611 01/17/19 0515 01/20/19 0600  WBC 7.6 5.7 6.5    Liver Function Tests: No results for input(s): AST, ALT, ALKPHOS, BILITOT, PROT, ALBUMIN in the last 168 hours. No results for input(s): LIPASE, AMYLASE in the last 168 hours. No results for input(s): AMMONIA in the last 168 hours.  ABG    Component Value Date/Time   PHART 7.380 01/03/2019 0051   PCO2ART 44.8 01/03/2019 0051   PO2ART 432.0 (H) 01/03/2019 0051   HCO3 26.6 01/03/2019 0051   TCO2 28 01/03/2019 0051   O2SAT 100.0 01/03/2019 0051     Coagulation Profile: Recent Labs  Lab 01/20/19 0600  INR 1.1    Cardiac Enzymes: No results for input(s): CKTOTAL, CKMB, CKMBINDEX, TROPONINI in the last 168 hours.  HbA1C: Hgb A1c MFr Bld  Date/Time Value  Ref Range Status  01/03/2019 02:39 AM 5.9 (H) 4.8 - 5.6 % Final    Comment:    (NOTE) Pre diabetes:          5.7%-6.4% Diabetes:              >6.4% Glycemic control for   <7.0% adults with diabetes   09/29/2013 04:15 AM 5.9 (H) <5.7 % Final    Comment:    (NOTE)                                                                       According to the ADA Clinical Practice Recommendations for 2011, when HbA1c is used as a screening test:  >=6.5%   Diagnostic of Diabetes Mellitus           (if abnormal result is confirmed) 5.7-6.4%   Increased risk of developing Diabetes Mellitus References:Diagnosis and Classification of Diabetes Mellitus,Diabetes Care,2011,34(Suppl 1):S62-S69 and Standards of Medical Care in         Diabetes - 2011,Diabetes Care,2011,34 (Suppl 1):S11-S61.    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 Recovery will take weeks to months. Seizure disorder on anticonvulsants - Neurology to review in light of ongoing encephalopathy.  Prior CVA with left-sided weakness.  PLAN:  Continue daily trach collar trials Apnea likely indicates some degree of brain injury.  AED's per Neurology.  Best practice:  Diet: PepUp Pain/Anxiety/Delirium protocol (if indicated): none required VAP protocol (if indicated): bundle in place. DVT prophylaxis: Lovenox GI prophylaxis: Pantoprazole Urinary catheter: required for skin breakdown Glucose control: Phase 1.  Mobility: Bedrest Code Status: Full Family Communication: Daughter last updated prior to tracheostomy. Disposition: Ready for LTAC placement   Critical care time: 35  min including chart data review, examination of patient, multidisciplinary rounds, and frequent assessment and modification of ventilator settings.   Lynnell Catalan, MD [facility] ICU Physician [facility] [place] Critical Care  Pager: [phone] Mobile: [phone] After hours: [phone].  01/22/2019, 11:02 AM

## 2019-01-22 NOTE — Progress Notes (Signed)
RT NOTE:  Pt was placed on trach collar 28%/5 L. Pt's vitals are stable at this time. RT will continue to monitor.

## 2019-01-23 LAB — GLUCOSE, CAPILLARY
GLUCOSE-CAPILLARY: 109 mg/dL — AB (ref 70–99)
GLUCOSE-CAPILLARY: 124 mg/dL — AB (ref 70–99)
Glucose-Capillary: 112 mg/dL — ABNORMAL HIGH (ref 70–99)
Glucose-Capillary: 156 mg/dL — ABNORMAL HIGH (ref 70–99)
Glucose-Capillary: 91 mg/dL (ref 70–99)
Glucose-Capillary: 109 mg/dL — ABNORMAL HIGH (ref 70–99)

## 2019-01-23 LAB — CREATININE, SERUM
Creatinine, Ser: 0.62 mg/dL (ref 0.44–1.00)
GFR calc non Af Amer: >60 mL/min (ref >60)

## 2019-01-23 NOTE — Progress Notes (Addendum)
NAMEVernadine Wang, MRN:  [id number], DOB:  1951/04/23, LOS: 21 ADMISSION DATE:  01/02/2019, CONSULTATION DATE:  01/02/2019 REFERRING MD:  TRH, CHIEF COMPLAINT:  Status epilepticus.   HPI/course in hospital  68 year old woman with AMS due to status epilepticus. Required high dose sedative infusions to gain control. Prolonged encephalopathy since then, slow to awake and follow commands.  Started on amantadine with limited improvement. Percutaneous tracheostomy placed 3/23.  Past Medical History   Past Medical History:  Diagnosis Date  . High cholesterol   . Hypertension   . Stroke [facility])      Past Surgical History:  Procedure Laterality Date  . CESAREAN SECTION       Interim history/subjective:  Remains only partially responsive. Was able to follow commands on the right side for RN and smiled for her. Has been on trach collar for >24h  Objective   Blood pressure 122/75, pulse 87, temperature 98.3 F (36.8 C), temperature source Axillary, resp. rate (!) 26, height 5' (1.524 m), weight 58.6 kg, SpO2 99 %.    Vent Mode: PSV;CPAP FiO2 (%):  [28 %-30 %] 28 % PEEP:  [5 cmH20] 5 cmH20 Pressure Support:  [10 cmH20] 10 cmH20 Plateau Pressure:  [14 cmH20] 14 cmH20   Intake/Output Summary (Last 24 hours) at 01/23/2019 1005 Last data filed at 01/23/2019 0700 Gross per 24 hour  Intake 1198.95 ml  Output 3120 ml  Net -1921.05 ml   Filed Weights   01/21/19 0500 01/22/19 0500 01/23/19 0500  Weight: 53.7 kg 57.9 kg 58.6 kg    Examination: Physical Exam HENT:     Head: Normocephalic and atraumatic.     Nose:     Comments: Cortrak tube in place with no ulceration.    Mouth/Throat:     Mouth: Mucous membranes are moist.  Neck:     Comments: Tracheostomy in place. Cardiovascular:     Pulses: Normal pulses.     Heart sounds: Normal heart sounds.  Pulmonary:     Breath sounds: Normal breath sounds.     Comments: No asynchrony. No apneic spells on PSV. Abdominal:     Palpations:  Abdomen is soft.  Genitourinary:    Comments: Foley in place due to labial ulceration from [place] Skin:    General: Skin is warm and dry.  Neurological:     Mental Status: She is lethargic.     GCS: GCS eye subscore is 4. GCS verbal subscore is 1. GCS motor subscore is 6.     Cranial Nerves: Facial asymmetry present.     Motor: Weakness (weak on left side) present.     Comments: Delayed motor responses.  Did not follow commands for me but made appropriate facial grimaces and appeared to mouth words with noxious stimulation, Opens eyes to voice.      Ancillary tests (personally reviewed)  CBC: Recent Labs  Lab 01/17/19 0515 01/20/19 0600 01/22/19 1030  WBC 5.7 6.5 5.2  HGB 8.8* 8.7* 8.4*  HCT 27.9* 28.5* 27.3*  MCV 96.2 95.6 94.1  PLT 298 306 343    Basic Metabolic Panel: Recent Labs  Lab 01/17/19 0515 01/20/19 0600 01/22/19 1030 01/23/19 0504  NA 138 137 137  --   K 3.3* 3.7 3.4*  --   CL 102 102 101  --   CO2 28 28 27   --   GLUCOSE 105* 107* 123*  --   BUN 18 22 13   --   CREATININE 0.51 0.63 0.55 0.62  CALCIUM 8.6* 8.6* 8.5*  --    GFR: Estimated Creatinine Clearance: 55.4 mL/min (by C-G formula based on SCr of 0.62 mg/dL). Recent Labs  Lab 01/17/19 0515 01/20/19 0600 01/22/19 1030  WBC 5.7 6.5 5.2    Liver Function Tests: No results for input(s): AST, ALT, ALKPHOS, BILITOT, PROT, ALBUMIN in the last 168 hours. No results for input(s): LIPASE, AMYLASE in the last 168 hours. No results for input(s): AMMONIA in the last 168 hours.  ABG    Component Value Date/Time   PHART 7.380 01/03/2019 0051   PCO2ART 44.8 01/03/2019 0051   PO2ART 432.0 (H) 01/03/2019 0051   HCO3 26.6 01/03/2019 0051   TCO2 28 01/03/2019 0051   O2SAT 100.0 01/03/2019 0051     Coagulation Profile: Recent Labs  Lab 01/20/19 0600  INR 1.1    Cardiac Enzymes: No results for input(s): CKTOTAL, CKMB, CKMBINDEX, TROPONINI in the last 168 hours.  HbA1C: Hgb A1c MFr Bld   Date/Time Value Ref Range Status  01/03/2019 02:39 AM 5.9 (H) 4.8 - 5.6 % Final    Comment:    (NOTE) Pre diabetes:          5.7%-6.4% Diabetes:              >6.4% Glycemic control for   <7.0% adults with diabetes   09/29/2013 04:15 AM 5.9 (H) <5.7 % Final    Comment:    (NOTE)                                                                       According to the ADA Clinical Practice Recommendations for 2011, when HbA1c is used as a screening test:  >=6.5%   Diagnostic of Diabetes Mellitus           (if abnormal result is confirmed) 5.7-6.4%   Increased risk of developing Diabetes Mellitus References:Diagnosis and Classification of Diabetes Mellitus,Diabetes Care,2011,34(Suppl 1):S62-S69 and Standards of Medical Care in         Diabetes - 2011,Diabetes Care,2011,34 (Suppl 1):S11-S61.    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 Recovery will take weeks to months. Seizure disorder on anticonvulsants - Neurology to review in light of ongoing encephalopathy.  Prior CVA with left-sided weakness.  PLAN:  Transfer to PCU once able to tolerate trach collar for >48h. SLP to assess for PMV. Progress rehabilitation as conditions improve AED's per Neurology.  Best practice:  Diet: PepUp Pain/Anxiety/Delirium protocol (if indicated): none required VAP protocol (if indicated): bundle in place. DVT prophylaxis: Lovenox GI prophylaxis: Pantoprazole Urinary catheter: required for skin breakdown Glucose control: Phase 1.  Mobility: Bedrest Code Status: Full Family Communication: Daughter updated by phone 3/25. Disposition: Ready for LTAC placement   Critical care time:  N/A   Lynnell Catalan, MD [facility] ICU Physician [facility]  Pager: [phone] Mobile: [phone] After hours: [phone].  01/23/2019, 10:05 AM

## 2019-01-23 NOTE — Progress Notes (Signed)
Physical Therapy Treatment Patient Details Name: Erika Wang MRN: [id number] DOB: 05/02/1951 Today's Date: 01/23/2019    History of Present Illness  Erika Wang is a 68 y.o. 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 EEG found patient to have seizure like activity. Pt remains unresponsive, intubated on 3/6 and now has a tracheostomy on 3/23.    PT Comments    No response noted throughout.  Emphasized trying to get responses, sitting EOB and transfer to the chair.    Follow Up Recommendations  LTACH     Equipment Recommendations  None recommended by PT    Recommendations for Other Services       Precautions / Restrictions      Mobility  Bed Mobility Overal bed mobility: Needs Assistance Bed Mobility: Supine to Sit;Sit to Supine     Supine to sit: Total assist;+2 for physical assistance;HOB elevated     General bed mobility comments: pt wit no initiation of task, completely dependent for mobility  Transfers Overall transfer level: Needs assistance   Transfers: Squat Pivot Transfers     Squat pivot transfers: Total assist;+2 physical assistance;+2 safety/equipment     General transfer comment: no response or assist throughout the transfer.  Ambulation/Gait                 Stairs             Wheelchair Mobility    Modified Rankin (Stroke Patients Only) Modified Rankin (Stroke Patients Only) Pre-Morbid Rankin Score: No symptoms Modified Rankin: Severe disability     Balance Overall balance assessment: Needs assistance Sitting-balance support: Feet supported;Feet unsupported;Single extremity supported Sitting balance-Leahy Scale: Zero Sitting balance - Comments: sat EOB x 8-10 min trying for truncal or any response.  Used startle and other stimuli to try to ellicit a response.                                     Cognition Arousal/Alertness: Lethargic Behavior During Therapy: Flat affect Overall Cognitive Status: Impaired/Different from baseline                                 General Comments: pt did not respond throughout treatment      Exercises      General Comments        Pertinent Vitals/Pain Pain Assessment: Faces Faces Pain Scale: No hurt    Home Living                      Prior Function            PT Goals (current goals can now be found in the care plan section) Acute Rehab PT Goals Patient Stated Goal: didn't state PT Goal Formulation: Patient unable to participate in goal setting Time For Goal Achievement: 02/04/19 Potential to Achieve Goals: Fair Progress towards PT goals: Not progressing toward goals - comment    Frequency    Min 2X/week      PT Plan Current plan remains appropriate    Co-evaluation              AM-PAC PT "6 Clicks" Mobility   Outcome Measure  Help needed turning from your back to your  side while in a flat bed without using bedrails?: Total Help needed moving from lying on your back to sitting on the side of a flat bed without using bedrails?: Total Help needed moving to and from a bed to a chair (including a wheelchair)?: Total Help needed standing up from a chair using your arms (e.g., wheelchair or bedside chair)?: Total Help needed to walk in hospital room?: Total Help needed climbing 3-5 steps with a railing? : Total 6 Click Score: 6    End of Session Equipment Utilized During Treatment: Oxygen Activity Tolerance: Patient tolerated treatment well Patient left: in chair;with call bell/phone within reach;Other (comment)(maxi sky pad.) Nurse Communication: Mobility status PT Visit Diagnosis: Muscle weakness (generalized) (M62.81);Other symptoms and signs involving the nervous system (R29.898);Difficulty in walking, not elsewhere classified (R26.2)     Time:  1440-1506 PT Time Calculation (min) (ACUTE ONLY): 26 min  Charges:  $Therapeutic Activity: 23-37 mins                     01/23/2019  Pinellas Bing, PT Acute Rehabilitation Services [phone]  (pager) [phone]  (office)   Eliseo Gum Granger Chui 01/23/2019, 3:46 PM

## 2019-01-23 NOTE — Progress Notes (Signed)
  Speech Language Pathology Treatment: Erika Wang Speaking valve  Patient Details Name: Erika Wang MRN: [id number] DOB: 03/16/1951 Today's Date: 01/23/2019 Time: 7902-4097 SLP Time Calculation (min) (ACUTE ONLY): 15 min  Assessment / Plan / Recommendation Clinical Impression  Pt was lethargic throughout the session. She did not open her eyes, verbalize, vocalize at the onset of the session  despite encouragement. Her respiratory rate was high 20s to low 30s at the onset of the session but increased to the high 30s with coughing. O2 saturation remained stable throughout the session. Pt's respiratory rate reduced to 16 with PMSV placement. She tolerated placement for 45 seconds but it was ultimately removed due to a noted increase in work of breathing and coughing. Passive vocalization was noted following placement but no verbalization despite mod-max cues. SLP will continue to follow pt for treatment and it is recommended that PMSV only be placed with SLP at this time.    HPI HPI: Patient is a 67 y.o. 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 EEG found seizure like activity. Patient was intubated on 3/6 and tracheostomy on 01/20/19 with #6 cuffed Shiley.       SLP Plan  Continue with current plan of care       Recommendations         Patient may use Passy-Muir Speech Valve: with SLP only PMSV Supervision: Full         Follow up Recommendations: Other (comment)(TBD) SLP Visit Diagnosis: Aphonia (R49.1) Plan: Continue with current plan of care       Erika Chancy I. Vear Clock, MS, CCC-SLP Acute Rehabilitation Services Office number [phone] Pager [phone]                Erika Wang 01/23/2019, 2:04 PM

## 2019-01-24 LAB — GLUCOSE, CAPILLARY
GLUCOSE-CAPILLARY: 112 mg/dL — AB (ref 70–99)
Glucose-Capillary: 106 mg/dL — ABNORMAL HIGH (ref 70–99)
Glucose-Capillary: 108 mg/dL — ABNORMAL HIGH (ref 70–99)
Glucose-Capillary: 123 mg/dL — ABNORMAL HIGH (ref 70–99)
Glucose-Capillary: 130 mg/dL — ABNORMAL HIGH (ref 70–99)
Glucose-Capillary: 99 mg/dL (ref 70–99)

## 2019-01-24 NOTE — Progress Notes (Signed)
Physical Therapy Treatment Patient Details Name: Erika Wang MRN: [id number] DOB: Jun 09, 1951 Today's Date: 01/24/2019    History of Present Illness  Erika Wang is a 68 y.o. 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 EEG found patient to have seizure like activity. Pt remains unresponsive, intubated on 3/6 and now has a tracheostomy on 3/23.    PT Comments    Pt remains unresponsive to all but painful stimuli. Performed PROM of UE and LE. Pt noted to have clonus in L LE with quick stretch of achilles tendon. PT will follow back for functional mobility in next session.   Follow Up Recommendations  LTACH     Equipment Recommendations  None recommended by PT       Precautions / Restrictions Restrictions Weight Bearing Restrictions: No    Mobility  Bed Mobility               General bed mobility comments: began to work on rolling, but pt with BM and RN reports she is going to place rectal pouch so unable today                  Modified Rankin (Stroke Patients Only) Modified Rankin (Stroke Patients Only) Pre-Morbid Rankin Score: No symptoms Modified Rankin: Severe disability        Cognition Arousal/Alertness: Lethargic Behavior During Therapy: Flat affect Overall Cognitive Status: Impaired/Different from baseline                                 General Comments: pt did not respond throughout treatment      Exercises Hand Exercises Forearm Supination: PROM;Both;5 reps;Supine Forearm Pronation: PROM;Both;5 reps;Supine Wrist Flexion: PROM;Both;5 reps;Supine Composite Extension: PROM;Both;5 reps;Supine Low Level/ICU Exercises Ankle Circles/Pumps: PROM;Both;5 reps;Supine Quad Sets: PROM;Both;5 reps;Supine Hip Extension: PROM;Both;5 reps;Supine Hip ABduction/ADduction: PROM;Both;5  reps;Supine Heel Slides: PROM;Both;Supine Shoulder Flexion: PROM;Both;5 reps;Sidelying Elbow Flexion: PROM;Both;5 reps;Supine    General Comments General comments (skin integrity, edema, etc.): VSS, RN came in for deep suction when pt began coughing that was not clearing secretions      Pertinent Vitals/Pain Pain Assessment: Faces Faces Pain Scale: Hurts a little bit Pain Location: shoulders with flexion PROM  Pain Descriptors / Indicators: Grimacing Pain Intervention(s): Limited activity within patient's tolerance;Monitored during session           PT Goals (current goals can now be found in the care plan section) Acute Rehab PT Goals Patient Stated Goal: didn't state PT Goal Formulation: Patient unable to participate in goal setting Time For Goal Achievement: 02/04/19 Potential to Achieve Goals: Fair    Frequency    Min 2X/week      PT Plan Current plan remains appropriate       AM-PAC PT "6 Clicks" Mobility   Outcome Measure  Help needed turning from your back to your side while in a flat bed without using bedrails?: Total Help needed moving from lying on your back to sitting on the side of a flat bed without using bedrails?: Total Help needed moving to and from a bed to a chair (including a wheelchair)?: Total Help needed standing up from a chair using your arms (e.g., wheelchair or bedside chair)?: Total Help needed to walk in hospital room?: Total Help needed climbing 3-5 steps with a railing? : Total 6 Click  Score: 6    End of Session Equipment Utilized During Treatment: Oxygen Activity Tolerance: Patient tolerated treatment well Patient left: in chair;with call bell/phone within reach;Other (comment)(maxi sky pad.) Nurse Communication: Mobility status PT Visit Diagnosis: Muscle weakness (generalized) (M62.81);Other symptoms and signs involving the nervous system (R29.898);Difficulty in walking, not elsewhere classified (R26.2)     Time: 7425-9563 PT  Time Calculation (min) (ACUTE ONLY): 16 min  Charges:  $Therapeutic Exercise: 8-22 mins                     Gedalia Mcmillon B. Beverely Risen PT, DPT Acute Rehabilitation Services Pager [phone] Office [phone]    Elon Alas Fleet 01/24/2019, 3:10 PM

## 2019-01-24 NOTE — Care Management (Signed)
Pt has been denied for LTAC by insurance, stating that she is tolerating trach collar for over 24hrs.    MD: please consider peer to peer appeal with [facility] Director.  Phone:  [phone], ext. [phone].    Appeal must be done ASAP.    Thank you,   Quintella Baton, RN, BSN  Trauma/Neuro ICU Case Manager [phone]

## 2019-01-24 NOTE — Progress Notes (Signed)
Pt transferred to 4NP01 after report given.  Daughter informed of pts new location.

## 2019-01-24 NOTE — Progress Notes (Signed)
NAMEAlianna Wang, MRN:  [id number], DOB:  Feb 27, 1951, LOS: 22 ADMISSION DATE:  01/02/2019, CONSULTATION DATE:  01/02/2019 REFERRING MD:  TRH, CHIEF COMPLAINT:  Status epilepticus.   HPI/course in hospital  68 year old woman with AMS due to status epilepticus. Required high dose sedative infusions to gain control. Prolonged encephalopathy since then, slow to awake and follow commands.  Started on amantadine with limited improvement. Percutaneous tracheostomy placed 3/23.  Past Medical History   Past Medical History:  Diagnosis Date  . High cholesterol   . Hypertension   . Stroke [facility])      Past Surgical History:  Procedure Laterality Date  . CESAREAN SECTION       Interim history/subjective:  Remains only partially responsive. Was able to follow commands on the right side for RN  Objective   Blood pressure 112/72, pulse 90, temperature 98.3 F (36.8 C), temperature source Axillary, resp. rate (!) 26, height 5' (1.524 m), weight 58.4 kg, SpO2 100 %.    FiO2 (%):  [28 %] 28 %   Intake/Output Summary (Last 24 hours) at 01/24/2019 0947 Last data filed at 01/24/2019 0900 Gross per 24 hour  Intake 1295.05 ml  Output 1030 ml  Net 265.05 ml   Filed Weights   01/22/19 0500 01/23/19 0500 01/24/19 0454  Weight: 57.9 kg 58.6 kg 58.4 kg    Examination: Physical Exam  General: Poorly responsive female on trach collar x24 hours HEENT: Trach collar in place Neuro: Poorly responsive CV: s1s2 rrr, no m/r/g PULM: even/non-labored, lungs bilaterally decreased air movement bilaterally IF:OYDX, non-tender, bsx4 active tube feedings in place Extremities: warm/dry, 1+ edema  Skin: no rashes or lesions  Ancillary tests (personally reviewed)  CBC: Recent Labs  Lab 01/20/19 0600 01/22/19 1030  WBC 6.5 5.2  HGB 8.7* 8.4*  HCT 28.5* 27.3*  MCV 95.6 94.1  PLT 306 343    Basic Metabolic Panel: Recent Labs  Lab 01/20/19 0600 01/22/19 1030 01/23/19 0504  NA 137 137  --   K  3.7 3.4*  --   CL 102 101  --   CO2 28 27  --   GLUCOSE 107* 123*  --   BUN 22 13  --   CREATININE 0.63 0.55 0.62  CALCIUM 8.6* 8.5*  --    GFR: Estimated Creatinine Clearance: 55.4 mL/min (by C-G formula based on SCr of 0.62 mg/dL). Recent Labs  Lab 01/20/19 0600 01/22/19 1030  WBC 6.5 5.2    Liver Function Tests: No results for input(s): AST, ALT, ALKPHOS, BILITOT, PROT, ALBUMIN in the last 168 hours. No results for input(s): LIPASE, AMYLASE in the last 168 hours. No results for input(s): AMMONIA in the last 168 hours.  ABG    Component Value Date/Time   PHART 7.380 01/03/2019 0051   PCO2ART 44.8 01/03/2019 0051   PO2ART 432.0 (H) 01/03/2019 0051   HCO3 26.6 01/03/2019 0051   TCO2 28 01/03/2019 0051   O2SAT 100.0 01/03/2019 0051     Coagulation Profile: Recent Labs  Lab 01/20/19 0600  INR 1.1    Cardiac Enzymes: No results for input(s): CKTOTAL, CKMB, CKMBINDEX, TROPONINI in the last 168 hours.  HbA1C: Hgb A1c MFr Bld  Date/Time Value Ref Range Status  01/03/2019 02:39 AM 5.9 (H) 4.8 - 5.6 % Final    Comment:    (NOTE) Pre diabetes:          5.7%-6.4% Diabetes:              >  6.4% Glycemic control for   <7.0% adults with diabetes   09/29/2013 04:15 AM 5.9 (H) <5.7 % Final    Comment:    (NOTE)                                                                       According to the ADA Clinical Practice Recommendations for 2011, when HbA1c is used as a screening test:  >=6.5%   Diagnostic of Diabetes Mellitus           (if abnormal result is confirmed) 5.7-6.4%   Increased risk of developing Diabetes Mellitus References:Diagnosis and Classification of Diabetes Mellitus,Diabetes Care,2011,34(Suppl 1):S62-S69 and Standards of Medical Care in         Diabetes - 2011,Diabetes Care,2011,34 (Suppl 1):S11-S61.    CBG: Recent Labs  Lab 01/23/19 1548 01/23/19 1943 01/23/19 2332 01/24/19 0323 01/24/19 0805  GLUCAP 91 109* 124* 99 108*     Assessment  & Plan:  Critically ill due acute respiratory failure requiring mechanical ventilation  P: 01/24/1999 2048 hrs. off mechanical ventilatory support Can remove ventilator from room. Continue pulmonary toilet.   Unable to protect airway due to prolonged encephalopathy following superrefractory status epilepticus.  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  PLAN: Neurologist following Continue antiseizure medication as ordered Questionable amount of neurological recovery is possible and how long will take to achieve. Consider transferring to progressive care unit with ultimate goal LTAC placement  Best practice:  Diet: PepUp Pain/Anxiety/Delirium protocol (if indicated): none required VAP protocol (if indicated): bundle in place. DVT prophylaxis: Lovenox GI prophylaxis: Pantoprazole Urinary catheter: required for skin breakdown Glucose control: Phase 1.  Mobility: Mobilize Code Status: Full Family Communication: Daughter last updated prior to tracheostomy. Disposition: Ready for LTAC placement, LTAC has been denied per insurance on 01/24/2019   [facility] Erika Wang ACNP Erika Wang PCCM Pager [phone] till 1 pm If no answer page [phone] 01/24/2019, 9:48 AM

## 2019-01-25 ENCOUNTER — Inpatient Hospital Stay (HOSPITAL_COMMUNITY): Payer: Medicare HMO

## 2019-01-25 LAB — BASIC METABOLIC PANEL
ANION GAP: 9 (ref 5–15)
ANION GAP: 5 (ref 5–15)
BUN: 19 mg/dL (ref 8–23)
BUN: 20 mg/dL (ref 8–23)
CALCIUM: 8.7 mg/dL — AB (ref 8.9–10.3)
CO2: 23 mmol/L (ref 22–32)
CO2: 20 mmol/L — ABNORMAL LOW (ref 22–32)
Calcium: 7.9 mg/dL — ABNORMAL LOW (ref 8.9–10.3)
Chloride: 104 mmol/L (ref 98–111)
Chloride: 108 mmol/L (ref 98–111)
Creatinine, Ser: 0.56 mg/dL (ref 0.44–1.00)
Creatinine, Ser: 0.58 mg/dL (ref 0.44–1.00)
GFR calc Af Amer: >60 mL/min (ref >60)
GFR calc Af Amer: >60 mL/min (ref >60)
GFR calc non Af Amer: >60 mL/min (ref >60)
GFR calc non Af Amer: >60 mL/min (ref >60)
GLUCOSE: 126 mg/dL — AB (ref 70–99)
GLUCOSE: 138 mg/dL — AB (ref 70–99)
Potassium: 3.5 mmol/L (ref 3.5–5.1)
Potassium: 3.3 mmol/L — ABNORMAL LOW (ref 3.5–5.1)
Sodium: 136 mmol/L (ref 135–145)
Sodium: 133 mmol/L — ABNORMAL LOW (ref 135–145)

## 2019-01-25 LAB — GLUCOSE, CAPILLARY
GLUCOSE-CAPILLARY: 132 mg/dL — AB (ref 70–99)
Glucose-Capillary: 103 mg/dL — ABNORMAL HIGH (ref 70–99)
Glucose-Capillary: 114 mg/dL — ABNORMAL HIGH (ref 70–99)
Glucose-Capillary: 109 mg/dL — ABNORMAL HIGH (ref 70–99)
Glucose-Capillary: 114 mg/dL — ABNORMAL HIGH (ref 70–99)

## 2019-01-25 LAB — CBC WITH DIFFERENTIAL/PLATELET
Abs Immature Granulocytes: 0.07 10*3/uL (ref 0.00–0.07)
Basophils Absolute: 0 10*3/uL (ref 0.0–0.1)
Basophils Relative: 0 %
Eosinophils Absolute: 0.1 10*3/uL (ref 0.0–0.5)
Eosinophils Relative: 2 %
HCT: 32.3 % — ABNORMAL LOW (ref 36.0–46.0)
Hemoglobin: 10.3 g/dL — ABNORMAL LOW (ref 12.0–15.0)
Immature Granulocytes: 1 %
Lymphocytes Relative: 14 %
Lymphs Abs: 0.9 10*3/uL (ref 0.7–4.0)
MCH: 29.9 pg (ref 26.0–34.0)
MCHC: 31.9 g/dL (ref 30.0–36.0)
MCV: 93.6 fL (ref 80.0–100.0)
Monocytes Absolute: 0.9 10*3/uL (ref 0.1–1.0)
Monocytes Relative: 15 %
NEUTROS PCT: 68 %
Neutro Abs: 4.1 10*3/uL (ref 1.7–7.7)
Platelets: 328 10*3/uL (ref 150–400)
RBC: 3.45 MIL/uL — ABNORMAL LOW (ref 3.87–5.11)
RDW: 15.3 % (ref 11.5–15.5)
WBC: 6.1 10*3/uL (ref 4.0–10.5)
nRBC: 0 % (ref 0.0–0.2)

## 2019-01-25 LAB — CBC
HCT: 29.5 % — ABNORMAL LOW (ref 36.0–46.0)
Hemoglobin: 9.5 g/dL — ABNORMAL LOW (ref 12.0–15.0)
MCH: 29.8 pg (ref 26.0–34.0)
MCHC: 32.2 g/dL (ref 30.0–36.0)
MCV: 92.5 fL (ref 80.0–100.0)
Platelets: 376 10*3/uL (ref 150–400)
RBC: 3.19 MIL/uL — ABNORMAL LOW (ref 3.87–5.11)
RDW: 15.3 % (ref 11.5–15.5)
WBC: 5.7 10*3/uL (ref 4.0–10.5)
nRBC: 0 % (ref 0.0–0.2)

## 2019-01-25 LAB — MAGNESIUM: Magnesium: 1.7 mg/dL (ref 1.7–2.4)

## 2019-01-25 LAB — PHOSPHORUS: Phosphorus: 2.8 mg/dL (ref 2.5–4.6)

## 2019-01-25 MED ORDER — ACETAMINOPHEN 160 MG/5ML PO SOLN
650.0000 mg | Freq: Four times a day (QID) | ORAL | Status: DC | PRN
  Administered 2019-01-27: 650 mg
  Filled 2019-01-25 (×2): qty 20.3

## 2019-01-25 MED ORDER — ORAL CARE MOUTH RINSE
15.0000 mL | Freq: Two times a day (BID) | OROMUCOSAL | Status: DC
  Administered 2019-01-25 – 2019-01-27 (×6): 15 mL via OROMUCOSAL

## 2019-01-25 MED ORDER — GERHARDT'S BUTT CREAM
TOPICAL_CREAM | Freq: Two times a day (BID) | CUTANEOUS | Status: DC
  Administered 2019-01-25 – 2019-01-31 (×13): via TOPICAL
  Filled 2019-01-25: qty 1

## 2019-01-25 MED ORDER — TOPIRAMATE 25 MG PO TABS
50.0000 mg | ORAL_TABLET | Freq: Two times a day (BID) | ORAL | Status: AC
  Administered 2019-01-25 – 2019-01-28 (×7): 50 mg
  Filled 2019-01-25 (×7): qty 2

## 2019-01-25 MED ORDER — TOPIRAMATE 100 MG PO TABS: 100.0000 mg | ORAL_TABLET | Freq: Two times a day (BID) | ORAL | Status: DC

## 2019-01-25 MED ORDER — SENNA 8.6 MG PO TABS: 1.0000 | ORAL_TABLET | Freq: Every day | ORAL | Status: DC | PRN

## 2019-01-25 MED ORDER — TOPIRAMATE 25 MG PO TABS
75.0000 mg | ORAL_TABLET | Freq: Two times a day (BID) | ORAL | Status: DC
  Administered 2019-01-29 – 2019-01-31 (×5): 75 mg
  Filled 2019-01-25 (×5): qty 3

## 2019-01-25 MED ORDER — CHLORHEXIDINE GLUCONATE 0.12 % MT SOLN
15.0000 mL | Freq: Two times a day (BID) | OROMUCOSAL | Status: DC
  Administered 2019-01-25 – 2019-01-31 (×8): 15 mL via OROMUCOSAL
  Filled 2019-01-25 (×8): qty 15

## 2019-01-25 NOTE — Progress Notes (Signed)
[place] TEAM 1 - Stepdown/ICU TEAM  Erika Wang  QBV:[id number] DOB: 1951-04-10 DOA: 01/02/2019 PCP: Patient, No Pcp Per    Brief Narrative:  67yo admitted w/ AMS due to status epilepticus. She required high dose sedative infusions to gain control, which has lead to prolonged encephalopathy.    Subjective: Unresponsive. Some minimal movement in bed, but not purposeful during my visit. Does not look uncomfortable. No evident resp distress or uncontrolled pain.   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 - med titration as per Neurology w/ current plan of adding topiramate slowly and once on a theraputic dose weaning keppra as spelled out in their last note   Hypoxic ischemic encephalopathy Cont supportive care - expected to require significantly more time to see improvement   Aspiration PNA Completed a 5 day course of Unasyn   Mild Hypokalemia  Supplement and follow   Mild Hypomagnesemia Supplement to goal of 2.0  Normocytic anemia  Likely simply due to critical illness - check anemia panel   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   Staph epi + blood cx 2 of 2 blood cx + on 3/5 - WBC normal, but did have a slight temp elevation 3/27 - follow - if recurrent fever will need to recheck blood cxs to assure this is not a persisting issue - repeat cultures from 3/6 w/o growth   DVT prophylaxis: lovenox  Code Status: FULL CODE Family Communication: no family present at time of exam  Disposition Plan: LTACH denied by her insurance - not sure LTACH is still appropriate as pt no longer requiring vent - appears SNF rehab stay may be best option   Consultants:  PCCM Neurology   Antimicrobials:  None presently   Objective: Blood pressure 126/79, pulse  90, temperature 97.7 F (36.5 C), temperature source Axillary, resp. rate (!) 28, height 5' (1.524 m), weight 55.6 kg, SpO2 98 %.  Intake/Output Summary (Last 24 hours) at 01/25/2019 1428 Last data filed at 01/25/2019 1200 Gross per 24 hour  Intake 1200.09 ml  Output 1675 ml  Net -474.91 ml   Filed Weights   01/23/19 0500 01/24/19 0454 01/25/19 0500  Weight: 58.6 kg 58.4 kg 55.6 kg    Examination: General: No acute respiratory distress evident - non-communicative  Lungs: Clear to auscultation bilaterally without wheezes or crackles Cardiovascular: Regular rate and rhythm without murmur  Abdomen: Nontender, nondistended, soft, bowel sounds positive, no rebound, no ascites, no appreciable mass Extremities: No significant cyanosis, clubbing, or edema bilateral lower extremities  CBC: Recent Labs  Lab 01/22/19 1030 01/25/19 0555 01/25/19 1155  WBC 5.2 6.1 5.7  NEUTROABS  --  4.1  --   HGB 8.4* 10.3* 9.5*  HCT 27.3* 32.3* 29.5*  MCV 94.1 93.6 92.5  PLT 343 328 376   Basic Metabolic Panel: Recent Labs  Lab 01/22/19 1030 01/23/19 0504 01/25/19 0555 01/25/19 1155  NA 137  --  136 133*  K 3.4*  --  3.5 3.3*  CL 101  --  104 108  CO2 27  --  23 20*  GLUCOSE 123*  --  126* 138*  BUN 13  --  19 20  CREATININE 0.55 0.62 0.56 0.58  CALCIUM 8.5*  --  8.7* 7.9*  MG  --   --  1.7  --   PHOS  --   --  2.8  --    GFR: Estimated Creatinine Clearance: 54.1 mL/min (by C-G formula based on SCr of 0.58 mg/dL).  Liver Function Tests: No results for input(s): AST, ALT, ALKPHOS, BILITOT, PROT, ALBUMIN in the last 168 hours. No results for input(s): LIPASE, AMYLASE in the last 168 hours. No results for input(s): AMMONIA in the last 168 hours.  Coagulation Profile: Recent Labs  Lab 01/20/19 0600  INR 1.1    HbA1C: Hgb A1c MFr Bld  Date/Time Value Ref Range Status  01/03/2019 02:39 AM 5.9 (H) 4.8 - 5.6 % Final    Comment:    (NOTE) Pre diabetes:          5.7%-6.4% Diabetes:               >6.4% Glycemic control for   <7.0% adults with diabetes   09/29/2013 04:15 AM 5.9 (H) <5.7 % Final    Comment:    (NOTE)                                                                       According to the ADA Clinical Practice Recommendations for 2011, when HbA1c is used as a screening test:  >=6.5%   Diagnostic of Diabetes Mellitus           (if abnormal result is confirmed) 5.7-6.4%   Increased risk of developing Diabetes Mellitus References:Diagnosis and Classification of Diabetes Mellitus,Diabetes Care,2011,34(Suppl 1):S62-S69 and Standards of Medical Care in         Diabetes - 2011,Diabetes Care,2011,34 (Suppl 1):S11-S61.    CBG: Recent Labs  Lab 01/24/19 1944 01/24/19 2356 01/25/19 0340 01/25/19 0738 01/25/19 1236  GLUCAP 123* 106* 103* 114* 132*     Scheduled Meds: . amantadine  100 mg Per Tube Daily  . chlorhexidine  15 mL Mouth Rinse BID  . diltiazem  60 mg Per Tube Q6H  . enoxaparin (LOVENOX) injection  40 mg Subcutaneous Q24H  . feeding supplement (PRO-STAT SUGAR FREE 64)  30 mL Per Tube TID  . feeding supplement (VITAL AF 1.2 CAL)  1,000 mL Per Tube Q24H  . free water  200 mL Per Tube Q6H  . Gerhardt's butt cream   Topical BID  . insulin aspart  0-15 Units Subcutaneous Q4H  . lacosamide  200 mg Per Tube BID  . levETIRAcetam  1,500 mg Per Tube BID  . mouth rinse  15 mL Mouth Rinse q12n4p  . multivitamin  15 mL Per Tube Daily  . pantoprazole sodium  40 mg Per Tube QHS  . phenytoin  100 mg Per Tube Daily  . phenytoin  75 mg Per Tube BID  . [START ON 02/05/2019] topiramate  100 mg Oral BID  . topiramate  50 mg Oral BID  . [START ON 01/29/2019] topiramate  75 mg Oral BID     LOS: 23 days   Lonia Blood, MD [facility] Office  [phone] Pager - Text Page per Amion  If 7PM-7AM, please contact night-coverage per Amion 01/25/2019, 2:28 PM

## 2019-01-26 LAB — IRON AND TIBC
Iron: 22 ug/dL — ABNORMAL LOW (ref 28–170)
Saturation Ratios: 12 % (ref 10.4–31.8)
TIBC: 189 ug/dL — ABNORMAL LOW (ref 250–450)
UIBC: 167 ug/dL

## 2019-01-26 LAB — CBC
HCT: 33.1 % — ABNORMAL LOW (ref 36.0–46.0)
Hemoglobin: 10.5 g/dL — ABNORMAL LOW (ref 12.0–15.0)
MCH: 29.8 pg (ref 26.0–34.0)
MCHC: 31.7 g/dL (ref 30.0–36.0)
MCV: 94 fL (ref 80.0–100.0)
PLATELETS: 334 10*3/uL (ref 150–400)
RBC: 3.52 MIL/uL — ABNORMAL LOW (ref 3.87–5.11)
RDW: 15.6 % — ABNORMAL HIGH (ref 11.5–15.5)
WBC: 5.3 10*3/uL (ref 4.0–10.5)
nRBC: 0 % (ref 0.0–0.2)

## 2019-01-26 LAB — GLUCOSE, CAPILLARY
Glucose-Capillary: 106 mg/dL — ABNORMAL HIGH (ref 70–99)
Glucose-Capillary: 107 mg/dL — ABNORMAL HIGH (ref 70–99)
Glucose-Capillary: 120 mg/dL — ABNORMAL HIGH (ref 70–99)
Glucose-Capillary: 126 mg/dL — ABNORMAL HIGH (ref 70–99)
Glucose-Capillary: 155 mg/dL — ABNORMAL HIGH (ref 70–99)

## 2019-01-26 LAB — COMPREHENSIVE METABOLIC PANEL
ALT: 83 U/L — ABNORMAL HIGH (ref 0–44)
AST: 49 U/L — ABNORMAL HIGH (ref 15–41)
Albumin: 2 g/dL — ABNORMAL LOW (ref 3.5–5.0)
Alkaline Phosphatase: 99 U/L (ref 38–126)
Anion gap: 12 (ref 5–15)
BUN: 24 mg/dL — ABNORMAL HIGH (ref 8–23)
CHLORIDE: 100 mmol/L (ref 98–111)
CO2: 22 mmol/L (ref 22–32)
Calcium: 9.1 mg/dL (ref 8.9–10.3)
Creatinine, Ser: 0.57 mg/dL (ref 0.44–1.00)
GFR calc Af Amer: >60 mL/min (ref >60)
GFR calc non Af Amer: >60 mL/min (ref >60)
Glucose, Bld: 114 mg/dL — ABNORMAL HIGH (ref 70–99)
Potassium: 3.8 mmol/L (ref 3.5–5.1)
Sodium: 134 mmol/L — ABNORMAL LOW (ref 135–145)
Total Bilirubin: 0.3 mg/dL (ref 0.3–1.2)
Total Protein: 6.7 g/dL (ref 6.5–8.1)

## 2019-01-26 LAB — FERRITIN: Ferritin: 147 ng/mL (ref 11–307)

## 2019-01-26 LAB — AMMONIA: Ammonia: 34 umol/L (ref 9–35)

## 2019-01-26 LAB — RETICULOCYTES
Immature Retic Fract: 21.7 % — ABNORMAL HIGH (ref 2.3–15.9)
RBC.: 3.52 MIL/uL — ABNORMAL LOW (ref 3.87–5.11)
Retic Count, Absolute: 60.2 10*3/uL (ref 19.0–186.0)
Retic Ct Pct: 1.7 % (ref 0.4–3.1)

## 2019-01-26 LAB — VITAMIN B12: Vitamin B-12: 1065 pg/mL — ABNORMAL HIGH (ref 180–914)

## 2019-01-26 LAB — MAGNESIUM: Magnesium: 1.7 mg/dL (ref 1.7–2.4)

## 2019-01-26 LAB — FOLATE: FOLATE: 7.8 ng/mL (ref >5.9)

## 2019-01-26 LAB — PHENYTOIN LEVEL, TOTAL: Phenytoin Lvl: 10.9 ug/mL (ref 10.0–20.0)

## 2019-01-26 MED ORDER — PHENYTOIN 125 MG/5ML PO SUSP
75.0000 mg | Freq: Three times a day (TID) | ORAL | Status: DC
  Administered 2019-01-26 – 2019-01-27 (×4): 75 mg
  Filled 2019-01-26 (×5): qty 4

## 2019-01-26 MED ORDER — MAGNESIUM SULFATE 2 GM/50ML IV SOLN
2.0000 g | Freq: Once | INTRAVENOUS | Status: AC
  Administered 2019-01-26: 2 g via INTRAVENOUS
  Filled 2019-01-26: qty 50

## 2019-01-26 NOTE — Progress Notes (Addendum)
MEDICATION RELATED CONSULT NOTE  Pharmacy Consult for phenytoin Indication: seizure-like activity   Assessment: 68 year old female admitted 3/5 unresponsive with AMS. EEG (preliminary report) was positive for seizure activity so patient initiated on fosphenytoin load followed by phenytoin maintenance dosing. Patient now s/p burst suppression.  In addition to phenytoin, patient has was also started on topiramate on 3/25. There is a noted drug interaction between the two - phenytoin can decrease serum concentrations of topiramate and topiramate can increase serum concentrations of phenytoin.  Phenytoin level this AM 10.9; corrects to ~21.8 (supratherapeutic) based on albumin of 2. Since corrected phenytoin level is above goal of 10-20 and topiramate is being up-titrated, will decrease phenytoin dose.  Plan:  - Decrease phenytoin to 75 mg per tube TID - Check phenytoin level 4/3  - Monitor for side effects   Arvilla Market, PharmD PGY1 Pharmacy Resident Phone [phone] 01/26/2019     8:51 AM

## 2019-01-26 NOTE — Progress Notes (Signed)
[place] TEAM 1 - Stepdown/ICU TEAM  Erika Wang  [id number]:[id number] DOB: 09-30-1951 DOA: 01/02/2019 PCP: Patient, No Pcp Per    Brief Narrative:  67yo admitted w/ AMS due to status epilepticus. She required high dose sedative infusions to gain control, which has lead to prolonged encephalopathy.    Subjective: Remains non-communicative. Does not appear to be in pain or resp distress.   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 - med titration as per Neurology w/ current plan of adding topiramate slowly and once on a theraputic dose weaning keppra as spelled out in their last note   Hypoxic ischemic encephalopathy Cont supportive care - expected to require significantly more time to see improvement, if at all - will clearly require long term supportive facility for ongoing medical care (SNF)  Aspiration PNA Completed a 5 day course of Unasyn - WBC normal w/ no fever   Nutrition  Cortrak dependent presently - will need conversion to PEG this week as she will not recover swallow soon enough to utilize only the Cortrak   Mild Hypokalemia  Corrected   Mild Hypomagnesemia Supplement to goal of 2.0  Normocytic anemia  Likely simply due to critical illness - anemia panel c/w this - Hgb stable   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   Staph epi + blood cx 2 of 2 blood cx + on 3/5 - WBC normal, but did have a slight temp elevation 3/27 - follow - if recurrent fever will need to recheck blood cxs to assure this is not a persisting issue - repeat cultures from 3/6 w/o growth   DVT prophylaxis: lovenox  Code Status: FULL CODE Family Communication: no family present at time of exam  Disposition Plan:  appears SNF rehab may be best option   Consultants:  PCCM Neurology   Antimicrobials:  None presently   Objective: Blood pressure 123/72, pulse 93, temperature 98.9 F (37.2 C), temperature source Oral, resp. rate (!) 29, height 5' (1.524 m), weight 55 kg, SpO2 100 %.  Intake/Output Summary (Last 24 hours) at 01/26/2019 1354 Last data filed at 01/26/2019 1200 Gross per 24 hour  Intake 958.47 ml  Output 1650 ml  Net -691.53 ml   Filed Weights   01/24/19 0454 01/25/19 0500 01/26/19 0500  Weight: 58.4 kg 55.6 kg 55 kg    Examination: General: No acute respiratory distress - non-communicative  Lungs: Clear to auscultation bilaterally  Cardiovascular: Regular rate and rhythm Abdomen: NT/ND, soft, bs+ Extremities: trace B LE edema   CBC: Recent Labs  Lab 01/25/19 0555 01/25/19 1155 01/26/19 0422  WBC 6.1 5.7 5.3  NEUTROABS 4.1  --   --   HGB 10.3* 9.5* 10.5*  HCT 32.3* 29.5* 33.1*  MCV 93.6 92.5 94.0  PLT 328 376 334   Basic Metabolic Panel: Recent Labs  Lab 01/25/19 0555 01/25/19 1155 01/26/19 0422  NA 136 133* 134*  K 3.5 3.3* 3.8  CL 104 108 100  CO2 23 20* 22  GLUCOSE 126* 138* 114*  BUN 19 20 24*  CREATININE 0.56 0.58 0.57  CALCIUM 8.7* 7.9* 9.1  MG 1.7  --  1.7  PHOS  2.8  --   --    GFR: Estimated Creatinine Clearance: 53.8 mL/min (by C-G formula based on SCr of 0.57 mg/dL).  Liver Function Tests: Recent Labs  Lab 01/26/19 0422  AST 49*  ALT 83*  ALKPHOS 99  BILITOT 0.3  PROT 6.7  ALBUMIN 2.0*    Recent Labs  Lab 01/26/19 0422  AMMONIA 34    Coagulation Profile: Recent Labs  Lab 01/20/19 0600  INR 1.1    HbA1C: Hgb A1c MFr Bld  Date/Time Value Ref Range Status  01/03/2019 02:39 AM 5.9 (H) 4.8 - 5.6 % Final    Comment:    (NOTE) Pre diabetes:          5.7%-6.4% Diabetes:              >6.4% Glycemic control for   <7.0% adults with diabetes   09/29/2013 04:15 AM 5.9 (H) <5.7 % Final    Comment:    (NOTE)                                                                        According to the ADA Clinical Practice Recommendations for 2011, when HbA1c is used as a screening test:  >=6.5%   Diagnostic of Diabetes Mellitus           (if abnormal result is confirmed) 5.7-6.4%   Increased risk of developing Diabetes Mellitus References:Diagnosis and Classification of Diabetes Mellitus,Diabetes Care,2011,34(Suppl 1):S62-S69 and Standards of Medical Care in         Diabetes - 2011,Diabetes Care,2011,34 (Suppl 1):S11-S61.    CBG: Recent Labs  Lab 01/25/19 1610 01/25/19 2025 01/26/19 0002 01/26/19 0414 01/26/19 1216  GLUCAP 109* 114* 120* 106* 126*     Scheduled Meds: . amantadine  100 mg Per Tube Daily  . chlorhexidine  15 mL Mouth Rinse BID  . diltiazem  60 mg Per Tube Q6H  . enoxaparin (LOVENOX) injection  40 mg Subcutaneous Q24H  . feeding supplement (PRO-STAT SUGAR FREE 64)  30 mL Per Tube TID  . feeding supplement (VITAL AF 1.2 CAL)  1,000 mL Per Tube Q24H  . Gerhardt's butt cream   Topical BID  . insulin aspart  0-15 Units Subcutaneous Q4H  . lacosamide  200 mg Per Tube BID  . levETIRAcetam  1,500 mg Per Tube BID  . mouth rinse  15 mL Mouth Rinse q12n4p  . multivitamin  15 mL Per Tube Daily  . pantoprazole sodium  40 mg Per Tube QHS  . phenytoin  75 mg Per Tube TID  . [START ON 02/05/2019] topiramate  100 mg Per Tube BID  . topiramate  50 mg Per Tube BID  . [START ON 01/29/2019] topiramate  75 mg Per Tube BID     LOS: 24 days   Lonia Blood, MD [facility] Office  [phone] Pager - Text Page per Amion  If 7PM-7AM, please contact night-coverage per Amion 01/26/2019, 1:54 PM

## 2019-01-27 ENCOUNTER — Inpatient Hospital Stay (HOSPITAL_COMMUNITY): Payer: Medicare HMO

## 2019-01-27 LAB — GLUCOSE, CAPILLARY
GLUCOSE-CAPILLARY: 146 mg/dL — AB (ref 70–99)
GLUCOSE-CAPILLARY: 146 mg/dL — AB (ref 70–99)
Glucose-Capillary: 113 mg/dL — ABNORMAL HIGH (ref 70–99)
Glucose-Capillary: 120 mg/dL — ABNORMAL HIGH (ref 70–99)
Glucose-Capillary: 133 mg/dL — ABNORMAL HIGH (ref 70–99)
Glucose-Capillary: 134 mg/dL — ABNORMAL HIGH (ref 70–99)
Glucose-Capillary: 152 mg/dL — ABNORMAL HIGH (ref 70–99)

## 2019-01-27 LAB — CBC
HCT: 30.7 % — ABNORMAL LOW (ref 36.0–46.0)
Hemoglobin: 9.6 g/dL — ABNORMAL LOW (ref 12.0–15.0)
MCH: 29.3 pg (ref 26.0–34.0)
MCHC: 31.3 g/dL (ref 30.0–36.0)
MCV: 93.6 fL (ref 80.0–100.0)
Platelets: 305 10*3/uL (ref 150–400)
RBC: 3.28 MIL/uL — ABNORMAL LOW (ref 3.87–5.11)
RDW: 15.7 % — AB (ref 11.5–15.5)
WBC: 4.6 10*3/uL (ref 4.0–10.5)
nRBC: 0 % (ref 0.0–0.2)

## 2019-01-27 LAB — COMPREHENSIVE METABOLIC PANEL
ALT: 80 U/L — ABNORMAL HIGH (ref 0–44)
AST: 47 U/L — ABNORMAL HIGH (ref 15–41)
Albumin: 1.9 g/dL — ABNORMAL LOW (ref 3.5–5.0)
Alkaline Phosphatase: 89 U/L (ref 38–126)
Anion gap: 11 (ref 5–15)
BILIRUBIN TOTAL: 0.3 mg/dL (ref 0.3–1.2)
BUN: 28 mg/dL — ABNORMAL HIGH (ref 8–23)
CO2: 21 mmol/L — ABNORMAL LOW (ref 22–32)
Calcium: 8.1 mg/dL — ABNORMAL LOW (ref 8.9–10.3)
Chloride: 103 mmol/L (ref 98–111)
Creatinine, Ser: 0.71 mg/dL (ref 0.44–1.00)
GFR calc Af Amer: >60 mL/min (ref >60)
GFR calc non Af Amer: >60 mL/min (ref >60)
Glucose, Bld: 157 mg/dL — ABNORMAL HIGH (ref 70–99)
Potassium: 3.3 mmol/L — ABNORMAL LOW (ref 3.5–5.1)
Sodium: 135 mmol/L (ref 135–145)
Total Protein: 6 g/dL — ABNORMAL LOW (ref 6.5–8.1)

## 2019-01-27 LAB — PROTIME-INR
INR: 1.2 (ref 0.8–1.2)
Prothrombin Time: 15.3 seconds — ABNORMAL HIGH (ref 11.4–15.2)

## 2019-01-27 LAB — APTT: aPTT: 40 seconds — ABNORMAL HIGH (ref 24–36)

## 2019-01-27 MED ORDER — LEVETIRACETAM IN NACL 1500 MG/100ML IV SOLN
1500.0000 mg | Freq: Two times a day (BID) | INTRAVENOUS | Status: DC
  Administered 2019-01-27 – 2019-01-28 (×2): 1500 mg via INTRAVENOUS
  Filled 2019-01-27 (×2): qty 100

## 2019-01-27 MED ORDER — PIPERACILLIN-TAZOBACTAM 3.375 G IVPB
3.3750 g | Freq: Three times a day (TID) | INTRAVENOUS | Status: AC
  Administered 2019-01-28 – 2019-01-31 (×12): 3.375 g via INTRAVENOUS
  Filled 2019-01-27 (×12): qty 50

## 2019-01-27 MED ORDER — POTASSIUM CHLORIDE 20 MEQ/15ML (10%) PO SOLN
20.0000 meq | Freq: Two times a day (BID) | ORAL | Status: DC
  Filled 2019-01-27: qty 15

## 2019-01-27 MED ORDER — PIPERACILLIN-TAZOBACTAM 3.375 G IVPB 30 MIN
3.3750 g | Freq: Once | INTRAVENOUS | Status: AC
  Administered 2019-01-27: 3.375 g via INTRAVENOUS
  Filled 2019-01-27: qty 50

## 2019-01-27 MED ORDER — JEVITY 1.5 CAL/FIBER PO LIQD
1000.0000 mL | ORAL | Status: DC
  Filled 2019-01-27: qty 1000

## 2019-01-27 MED ORDER — PHENYTOIN SODIUM 50 MG/ML IJ SOLN
75.0000 mg | Freq: Three times a day (TID) | INTRAMUSCULAR | Status: DC
  Administered 2019-01-27 – 2019-01-28 (×3): 75 mg via INTRAVENOUS
  Filled 2019-01-27 (×4): qty 1.5

## 2019-01-27 MED ORDER — CHLORHEXIDINE GLUCONATE 0.12% ORAL RINSE (MEDLINE KIT)
15.0000 mL | Freq: Two times a day (BID) | OROMUCOSAL | Status: DC
  Administered 2019-01-27 – 2019-01-31 (×8): 15 mL via OROMUCOSAL

## 2019-01-27 MED ORDER — POTASSIUM CHLORIDE 10 MEQ/100ML IV SOLN
10.0000 meq | INTRAVENOUS | Status: AC
  Administered 2019-01-27 (×3): 10 meq via INTRAVENOUS
  Filled 2019-01-27 (×3): qty 100

## 2019-01-27 MED ORDER — PRO-STAT SUGAR FREE PO LIQD
30.0000 mL | Freq: Every day | ORAL | Status: DC
  Administered 2019-01-28 – 2019-01-31 (×4): 30 mL
  Filled 2019-01-27 (×4): qty 30

## 2019-01-27 MED ORDER — MAGNESIUM SULFATE 2 GM/50ML IV SOLN
2.0000 g | Freq: Once | INTRAVENOUS | Status: AC
  Administered 2019-01-27: 2 g via INTRAVENOUS
  Filled 2019-01-27: qty 50

## 2019-01-27 MED ORDER — ORAL CARE MOUTH RINSE
15.0000 mL | OROMUCOSAL | Status: DC
  Administered 2019-01-27 – 2019-01-30 (×30): 15 mL via OROMUCOSAL

## 2019-01-27 MED ORDER — SODIUM CHLORIDE 0.9 % IV SOLN
200.0000 mg | Freq: Two times a day (BID) | INTRAVENOUS | Status: DC
  Administered 2019-01-27 – 2019-01-28 (×2): 200 mg via INTRAVENOUS
  Filled 2019-01-27 (×2): qty 20

## 2019-01-27 MED ORDER — FREE WATER
200.0000 mL | Freq: Four times a day (QID) | Status: DC
  Administered 2019-01-28 – 2019-01-31 (×14): 200 mL

## 2019-01-27 MED ORDER — PANTOPRAZOLE SODIUM 40 MG IV SOLR
40.0000 mg | INTRAVENOUS | Status: DC
  Administered 2019-01-27: 40 mg via INTRAVENOUS
  Filled 2019-01-27: qty 40

## 2019-01-27 NOTE — Progress Notes (Signed)
MEDICATION RELATED CONSULT NOTE  Pharmacy Consult for phenytoin Indication: seizure-like activity   Assessment: 68 year old female admitted 3/5 unresponsive with AMS. EEG (preliminary report) was positive for seizure activity so patient initiated on fosphenytoin load followed by phenytoin maintenance dosing. Patient now s/p burst suppression.  In addition to phenytoin, patient has was also started on topiramate on 3/25. There is a noted drug interaction between the two - phenytoin can decrease serum concentrations of topiramate and topiramate can increase serum concentrations of phenytoin.  Phenytoin level this AM 10.9; corrects to ~21.8 (supratherapeutic) based on albumin of 2. Since corrected phenytoin level is above goal of 10-20 and topiramate is being up-titrated, will decrease phenytoin dose.   Cortrak tube has become mispositioned. Rx has been asked to change per tube seizure meds to IV. We will change phenytoin, lacosamide, and keppra to IV at equivalent doses. Unfortunately, topiramate is not available as IV. Since she is on Protonix also so we will change to IV, too.   Plan:   Change Dilantin to 75mg  IV q8 Change Keppra to 1500mg  IV q12 Change Lacosamide to 200mg  IV q12 Change Protonix to 40mg  IV q24  Ulyses Southward, PharmD, [place], AAHIVP, CPP Infectious Disease Pharmacist 01/27/2019 3:14 PM

## 2019-01-27 NOTE — Care Management (Signed)
We have received a second denial from insurance company for [facility] hospital admission, after peer to peer review.  Spoke with pt's daughter, [name], who is in agreement with Skilled Nursing Facility search in [place] area.  Will initiate FL2 and provide bed offers when available.    Quintella Baton, RN, BSN  Trauma/Neuro ICU Case Manager [phone]

## 2019-01-27 NOTE — Progress Notes (Addendum)
Nutrition Follow-up   RD working remotely.  DOCUMENTATION CODES:   Not applicable  INTERVENTION:  Once Cortrak NGT replaced and ready for use, Initiate Jevity 1.5 formula at rate of 25 ml/hr and increase by 10 ml every 4 hours to goal rate of 45 ml/hr.  Provide 30 ml Prostat once daily per tube.  Provide free water flushes of 200 ml every 6 hours.   Tube feeding regimen to provide 1720 kcal (100% of needs), 84 grams of protein, and 1621 ml free water.   NUTRITION DIAGNOSIS:   Inadequate oral intake related to inability to eat(pt sedated and ventilated ) as evidenced by NPO status; ongoing  GOAL:   Patient will meet greater than or equal to 90% of their needs; progressing  MONITOR:   TF tolerance, Labs, Skin, Weight trends, I & O's  REASON FOR ASSESSMENT:   Ventilator    ASSESSMENT:   68 y.o. 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 Pt found to have PNA with sepsis. Pt now intubated s/p seizure.   Trach placed 3/23. Cortrak Placed 3/23. Off vent 3/27 currently on trach collar with 5 L/min O2.   Per MD, likely need conversion to PEG for continued enteral nutrition. Tube feeding discontinued this AM as pt with coughing/gagging up mucous/tube feeding through trach. Abdominal imaging done. NGT mispositioned. Plans to replace Cortrak NGT today. Once Cortrak tube ready for use, resume enteral nutrition. New tube feeding orders put in place to meet new estimated nutrition needs as pt now extubated.  Labs and medications reviewed.   Diet Order:   Diet Order            Diet NPO time specified  Diet effective now              EDUCATION NEEDS:   No education needs have been identified at this time  Skin:  Skin Assessment: Skin Integrity Issues: Skin Integrity Issues:: Stage II, Other (Comment) Stage II:  vagina- device related PI Other: pressure injury sacrum  Last BM:  3/30  Height:   Ht Readings from Last 1 Encounters:  01/02/19 5' (1.524 m)    Weight:   Wt Readings from Last 1 Encounters:  01/27/19 56.2 kg    Ideal Body Weight:  45.4 kg  BMI:  Body mass index is 24.2 kg/m.  Estimated Nutritional Needs:   Kcal:  1600-1800  Protein:  80-105 grams  Fluid:  1.6 - 1.8 L/day    Roslyn Smiling, MS, RD, LDN Pager # [phone] After hours/ weekend pager # [phone]

## 2019-01-27 NOTE — Consult Note (Signed)
WOC Nurse wound consult note Reason for Consult: re-consulted for same area between the anus and vagina once again.  Vaginal wound is from external urinary management device and is resolving with topical barrier cream, Gerhardts.   Reassessed the sacral wound today as well.  Wound type:Pressure and moisture.  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  Increased depth but no tunneling noted. ; 95% yellow  Perineum: 1cm x 1cm x 0.1cm' 90% yellow  Wound bed: see above Drainage (amount, consistency, odor) not able to assess due to the presence of barrier cream  Periwound: intact  Dressing procedure/placement/frequency: FOley catheter in place. Device related injury healing to labiaContinue Gerhardt's butt cream to the perineal wound Will implement calcium alginate to sacral wound.  Cover with moist NS gauze and secure with dry dressing daily. On Progressa bed while in the ICU, turning and repositioning as scheduled.  Will not follow at this time.  Please re-consult if needed.  Maple Hudson MSN, RN, FNP-BC CWON Wound, Ostomy, Continence Nurse Pager [phone]

## 2019-01-27 NOTE — Progress Notes (Signed)
  Speech Language Pathology Treatment: [name] Speaking valve  Patient Details Name: Erika Wang MRN: [id number] DOB: Jul 27, 1951 Today's Date: 01/27/2019 Time: 1330-1350 SLP Time Calculation (min) (ACUTE ONLY): 20 min  Assessment / Plan / Recommendation Clinical Impression  Patient seen to address toleration of PMV. Per patient's RN, patient was regurgitating and expectorating (tracheal expectoration) what appeared to be tube feeds and so she stopped tube feedings until cortrak position can be confirmed by fluroscopy. Patient exhibited CO2 trapping as evidenced by her forcefully blowing PMV valve off of trach when coughing. Patient exhibited s/s of increased WOB when PMV in place and was not able to tolerate valve for more than a few seconds. Patient would start coughing when repositioned, when trach collar was moved, when PMV was donned or doffed and exhibited tracheal expectoration of secretions. As compared to initial evaluation, patient is still unable to tolerate PMV and progress has been poor overall. Patient remains extremely lethargic, no attempts to speak/phonate and only will open eyes briefly when cued.     HPI HPI: Patient is a 68 y.o. female with PMH: high cholesterol, HTN, CVA in 2014 with residual mild lef-sided weakness, dyslipidemia, who was brought to ED after being found unresponsive on the floor and presented with refractory status epilepticus secondary to previous stroke. EEG found seizure like activity. Patient was intubated on 3/6 and tracheostomy on 01/20/19 with #6 cuffed Shiley.       SLP Plan  Continue with current plan of care;Other (Comment)(reduced frequency to 2x/week secondary to poor toleration and progress)       Recommendations  Diet recommendations: NPO Medication Administration: Via alternative means      Patient may use Passy-Muir Speech Valve: with SLP only PMSV Supervision: Full         Follow up Recommendations: Skilled Nursing  facility;LTACH SLP Visit Diagnosis: Aphonia (R49.1) Plan: Continue with current plan of care;Other (Comment)(reduced frequency to 2x/week secondary to poor toleration and progress)       GO                Angela Nevin, MA, CCC-SLP Speech Therapy [facility] Acute Rehab Pager: [phone]

## 2019-01-27 NOTE — Care Management Important Message (Signed)
Important Message  Patient Details  Name: Erika Wang MRN: [id number] Date of Birth: Dec 25, 1950   Medicare Important Message Given:  Yes    Karynn Deblasi 01/27/2019, 1:46 PM

## 2019-01-27 NOTE — Progress Notes (Signed)
[place] TEAM 1 - Stepdown/ICU TEAM  Veatrice Kells  WFU:[id number] DOB: [birth year] DOA: 01/02/2019 PCP: Patient, No Pcp Per    Brief Narrative:  67yo admitted w/ AMS due to status epilepticus. She required high dose sedative infusions to gain control, which has lead to prolonged encephalopathy.    Subjective: No significant appreciable change in clinical condition. Remains non-communicative. Does not interact w the examiner. Does not follow simple commands. Intermittent spontaneous movement of head and eyelids noted, but no other movement.   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 - med titration as per Neurology w/ current plan of adding topiramate slowly and once on a theraputic dose weaning keppra as spelled out in their last note   Hypoxic ischemic encephalopathy Cont supportive care - expected to require significantly more time to see improvement, if at all - will clearly require long term supportive facility for ongoing medical care (SNF)  Aspiration PNA Completed a 5 day course of Unasyn - WBC normal w/ no fever   Nutrition  Cortrak dependent presently - will need eventual conversion to PEG as she will not recover swallow soon enough to utilize only the Cortrak   Mild Hypokalemia  Continue to supplement to goal of 4.0  Mild Hypomagnesemia Supplement to goal of 2.0 - recheck in AM   Normocytic anemia  Likely simply due to critical illness - anemia panel c/w this - check Hgb intermittently   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   Staph epi + blood cx 2 of 2 blood cx + on 3/5 - WBC normal, but did have a slight temp elevation 3/27 - follow - if recurrent fever will need to recheck  blood cxs to assure this is not a persisting issue - repeat cultures from 3/6 w/o growth   DVT prophylaxis: lovenox  Code Status: FULL CODE Family Communication: no family present at time of exam  Disposition Plan: appears SNF rehab may be best option   Consultants:  PCCM Neurology   Antimicrobials:  None presently   Objective: Blood pressure 124/73, pulse 82, temperature 98.4 F (36.9 C), temperature source Oral, resp. rate (!) 22, height 5' (1.524 m), weight 56.2 kg, SpO2 99 %.  Intake/Output Summary (Last 24 hours) at 01/27/2019 1117 Last data filed at 01/27/2019 0900 Gross per 24 hour  Intake 535.17 ml  Output 1551 ml  Net -1015.83 ml   Filed Weights   01/25/19 0500 01/26/19 0500 01/27/19 0500  Weight: 55.6 kg 55 kg 56.2 kg    Examination: General: NAD - non-communicative  Lungs: Clear to auscultation bilaterally w/o wheezing  Cardiovascular: RRR - no M appreciated  Abdomen: NT/ND, soft, bs+ Extremities: trace B LE edema   CBC: Recent Labs  Lab 01/25/19 0555 01/25/19 1155 01/26/19 0422 01/27/19 0516  WBC 6.1 5.7 5.3 4.6  NEUTROABS 4.1  --   --   --   HGB 10.3* 9.5* 10.5* 9.6*  HCT 32.3* 29.5* 33.1* 30.7*  MCV 93.6 92.5 94.0 93.6  PLT 328 376 334 305   Basic Metabolic Panel: Recent Labs  Lab 01/25/19 0555 01/25/19 1155 01/26/19 0422 01/27/19 0516  NA 136 133* 134* 135  K 3.5 3.3* 3.8 3.3*  CL 104 108 100 103  CO2 23 20* 22 21*  GLUCOSE 126* 138* 114* 157*  BUN 19 20 24* 28*  CREATININE 0.56 0.58 0.57 0.71  CALCIUM 8.7* 7.9* 9.1 8.1*  MG 1.7  --  1.7  --   PHOS 2.8  --   --   --    GFR: Estimated Creatinine Clearance: 54.4 mL/min (by C-G formula based on SCr of 0.71 mg/dL).  Liver Function Tests: Recent Labs  Lab 01/26/19 0422 01/27/19 0516  AST 49* 47*  ALT 83* 80*  ALKPHOS 99 89  BILITOT 0.3 0.3  PROT 6.7 6.0*  ALBUMIN 2.0* 1.9*    Recent Labs  Lab 01/26/19 0422  AMMONIA 34    Coagulation Profile: Recent Labs  Lab 01/27/19  0516  INR 1.2    HbA1C: Hgb A1c MFr Bld  Date/Time Value Ref Range Status  01/03/2019 02:39 AM 5.9 (H) 4.8 - 5.6 % Final    Comment:    (NOTE) Pre diabetes:          5.7%-6.4% Diabetes:              >6.4% Glycemic control for   <7.0% adults with diabetes   09/29/2013 04:15 AM 5.9 (H) <5.7 % Final    Comment:    (NOTE)                                                                       According to the ADA Clinical Practice Recommendations for 2011, when HbA1c is used as a screening test:  >=6.5%   Diagnostic of Diabetes Mellitus           (if abnormal result is confirmed) 5.7-6.4%   Increased risk of developing Diabetes Mellitus References:Diagnosis and Classification of Diabetes Mellitus,Diabetes Care,2011,34(Suppl 1):S62-S69 and Standards of Medical Care in         Diabetes - 2011,Diabetes Care,2011,34 (Suppl 1):S11-S61.    CBG: Recent Labs  Lab 01/26/19 1635 01/26/19 2012 01/27/19 0026 01/27/19 0423 01/27/19 0714  GLUCAP 155* 107* 133* 146* 120*     Scheduled Meds: . amantadine  100 mg Per Tube Daily  . chlorhexidine  15 mL Mouth Rinse BID  . diltiazem  60 mg Per Tube Q6H  . enoxaparin (LOVENOX) injection  40 mg Subcutaneous Q24H  . feeding supplement (PRO-STAT SUGAR FREE 64)  30 mL Per Tube TID  . feeding supplement (VITAL AF 1.2 CAL)  1,000 mL Per Tube Q24H  . Gerhardt's butt cream   Topical BID  . insulin aspart  0-15 Units Subcutaneous Q4H  . lacosamide  200 mg Per Tube BID  . levETIRAcetam  1,500 mg Per Tube BID  . mouth rinse  15 mL Mouth Rinse q12n4p  . multivitamin  15 mL Per Tube Daily  . pantoprazole sodium  40 mg Per Tube QHS  . phenytoin  75 mg Per Tube TID  . [START ON 02/05/2019] topiramate  100 mg Per Tube BID  . topiramate  50 mg Per Tube BID  . [START ON 01/29/2019] topiramate  75 mg Per Tube BID     LOS: 25 days   Lonia Blood, MD [facility] Office  [phone] Pager - Text  Page per Loretha Stapler  If 7PM-7AM, please  contact night-coverage per Amion 01/27/2019, 11:17 AM

## 2019-01-27 NOTE — TOC Initial Note (Signed)
Transition of Care [facility]) - Initial/Assessment Note    Patient Details  Name: Erika Wang MRN: [id number] Date of Birth: 14-Mar-1951  [facility]) CM/SW Contact:    Erika Mac, RN Phone Number: 01/27/2019, 1:36 PM  Clinical Narrative:    Erika Wang is a 68 y.o. 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 EEG found patient to have seizure like activity.  PTA, pt independent of ADLS; has supportive daughter, Erika Wang.  Pt evaluated for LTAC admission, but insurance has denied x 2.  Will plan SNF search, with daughter's permission.               Expected Discharge Plan: Skilled Nursing Facility     Patient Goals and CMS Choice     Choice offered to / list presented to : Adult Children  Expected Discharge Plan and Services Expected Discharge Plan: Skilled Nursing Facility     Post Acute Care Choice: Skilled Nursing Facility                                         Need for Family Participation in Patient Care: Yes (Comment) Care giver support system in place?: Yes (comment)   Criminal Activity/Legal Involvement Pertinent to Current Situation/Hospitalization: No - Comment as needed        Alcohol / Substance Use: Not Applicable Psych Involvement: No (comment)  Admission diagnosis:  Trauma [T14.90XA] Disorientation [R41.0] Seizure-like activity (HCC) [R56.9] Community acquired pneumonia of right upper lobe of lung (HCC) [J18.1] Patient Active Problem List   Diagnosis Date Noted  . Pressure injury of skin 01/19/2019  . Acute respiratory failure with hypoxia (HCC)   . Status epilepticus (HCC)   . Community acquired pneumonia of right upper lobe of lung (HCC)   . Encephalopathy acute 01/02/2019  . Aspiration pneumonia (HCC) 01/02/2019  . Acute renal failure (HCC) 10/07/2013  . CVA (cerebral  infarction) 10/03/2013  . Infection of urinary tract 10/02/2013  . Cigar smoker motivated to quit 10/02/2013  . 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 Left hemiparesis (HCC) 10/02/2013  . Encounter for intubation 10/02/2013  . Stroke (HCC) 09/28/2013   PCP:  Patient, No Pcp Per Pharmacy:   [facility] [phone] - [place], [place] - [phone] AT [facility] OF [address] & [address] [place] [address] Phone: [phone] Fax: [phone]        Readmission Risk Interventions No flowsheet data found.   Quintella Baton, RN, BSN  Trauma/Neuro ICU Case Manager [phone]

## 2019-01-27 NOTE — Procedures (Signed)
Cortrak  Person Inserting Tube:  Camrin Gearheart C, RD Tube Type:  Cortrak - 43 inches Tube Location:  Left nare Initial Placement:  Stomach Secured by: Bridle Technique Used to Measure Tube Placement:  Documented cm marking at nare/ corner of mouth Cortrak Secured At:  66 cm    Cortrak Tube Team Note:  Consult received to place a Cortrak feeding tube.   No x-ray is required. RN may begin using tube.   If the tube becomes dislodged please keep the tube and contact the Cortrak team at [email or web address] (password [email or web address]) for replacement.  If after hours and replacement cannot be delayed, place a NG tube and confirm placement with an abdominal x-ray.    Merdis Snodgrass RD, LDN, CNSC [phone] Pager [phone] After Hours Pager   

## 2019-01-27 NOTE — Progress Notes (Signed)
Physical Therapy Treatment Patient Details Name: Erika Wang MRN: [id number] DOB: 10/02/1951 Today's Date: 01/27/2019    History of Present Illness  Erika Wang is a 68 y.o. 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 EEG found patient to have seizure like activity. Pt remains unresponsive, intubated on 3/6 and now has a tracheostomy on 3/23.    PT Comments    Pt with no purposeful responses during the session.  Emphasized time at EOB attempting to elicit truncal responses and simultaneously completing a thorough PROM of both bil LE and concentrating on bil UE's.   Follow Up Recommendations  LTACH     Equipment Recommendations  None recommended by PT    Recommendations for Other Services       Precautions / Restrictions Precautions Precaution Comments: pt with trach and on vent    Mobility  Bed Mobility Overal bed mobility: Needs Assistance Bed Mobility: Supine to Sit;Sit to Supine     Supine to sit: Total assist;+2 for physical assistance;HOB elevated Sit to supine: Total assist;+2 for physical assistance   General bed mobility comments: pivoted with bed pads, up to EOB without pt assist.  Transfers                    Ambulation/Gait                 Stairs             Wheelchair Mobility    Modified Rankin (Stroke Patients Only) Modified Rankin (Stroke Patients Only) Modified Rankin: Severe disability     Balance   Sitting-balance support: Feet supported;Feet unsupported;Single extremity supported Sitting balance-Leahy Scale: Poor Sitting balance - Comments: sat EOB x10 min trying for truncal or any response.   Completed PROM to all 4's while in sitting at EOB.  Did not notice any purposeful responses                                      Cognition Arousal/Alertness:  Lethargic Behavior During Therapy: Flat affect Overall Cognitive Status: Impaired/Different from baseline                                 General Comments: pt did not respond purposefully throughout treatment      Exercises      General Comments General comments (skin integrity, edema, etc.): vss throughout      Pertinent Vitals/Pain Pain Assessment: Faces Faces Pain Scale: No hurt Pain Intervention(s): Monitored during session    Home Living                      Prior Function            PT Goals (current goals can now be found in the care plan section) Acute Rehab PT Goals Patient Stated Goal: didn't state PT Goal Formulation: Patient unable to participate in goal setting Time For Goal Achievement: 02/04/19 Potential to Achieve Goals: Fair Progress towards PT goals: Not progressing toward goals - comment(not responding purposefully yet.)    Frequency    Min 2X/week      PT Plan Current plan remains appropriate    Co-evaluation  AM-PAC PT "6 Clicks" Mobility   Outcome Measure  Help needed turning from your back to your side while in a flat bed without using bedrails?: Total Help needed moving from lying on your back to sitting on the side of a flat bed without using bedrails?: Total Help needed moving to and from a bed to a chair (including a wheelchair)?: Total Help needed standing up from a chair using your arms (e.g., wheelchair or bedside chair)?: Total Help needed to walk in hospital room?: Total Help needed climbing 3-5 steps with a railing? : Total 6 Click Score: 6    End of Session Equipment Utilized During Treatment: Oxygen Activity Tolerance: Patient tolerated treatment well Patient left: in bed;with call bell/phone within reach Nurse Communication: Mobility status PT Visit Diagnosis: Muscle weakness (generalized) (M62.81);Other symptoms and signs involving the nervous system (R29.898);Difficulty in  walking, not elsewhere classified (R26.2)     Time: 4097-3532 PT Time Calculation (min) (ACUTE ONLY): 32 min  Charges:  $Therapeutic Exercise: 8-22 mins $Therapeutic Activity: 8-22 mins                     01/27/2019  Earling Bing, PT Acute Rehabilitation Services [phone]  (pager) [phone]  (office)   Eliseo Gum Shiniqua Groseclose 01/27/2019, 3:06 PM

## 2019-01-27 NOTE — Progress Notes (Addendum)
NAMEZayanna Wang, MRN:  [id number], DOB:  [birth year], LOS: 55 ADMISSION DATE:  01/02/2019, CONSULTATION DATE:  01/02/2019 REFERRING MD:  TRH, CHIEF COMPLAINT:  Status epilepticus.   HPI/course in hospital  68 year old woman with AMS due to status epilepticus. Required high dose sedative infusions to gain control. Prolonged encephalopathy since then, slow to awake and follow commands.  Started on amantadine with limited improvement. Percutaneous tracheostomy placed 3/23. Currently on ATM  Past Medical History   Past Medical History:  Diagnosis Date  . High cholesterol   . Hypertension   . Stroke [facility])      Past Surgical History:  Procedure Laterality Date  . CESAREAN SECTION       Interim history/subjective:  Remains only partially responsive. Not following commands  Objective   Blood pressure 124/73, pulse 90, temperature 98.4 F (36.9 C), temperature source Oral, resp. rate 18, height 5' (1.524 m), weight 56.2 kg, SpO2 98 %.    FiO2 (%):  [28 %] 28 %   Intake/Output Summary (Last 24 hours) at 01/27/2019 0801 Last data filed at 01/27/2019 0500 Gross per 24 hour  Intake 435.2 ml  Output 1201 ml  Net -765.8 ml   Filed Weights   01/25/19 0500 01/26/19 0500 01/27/19 0500  Weight: 55.6 kg 55 kg 56.2 kg   Examination: Physical Exam Vitals signs reviewed.  Constitutional:      General: She is not in acute distress. HENT:     Head: Normocephalic and atraumatic.     Nose: No congestion.     Mouth/Throat:     Mouth: Mucous membranes are moist.  Cardiovascular:     Rate and Rhythm: Normal rate and regular rhythm.     Heart sounds: No murmur.  Pulmonary:     Effort: Pulmonary effort is normal.     Breath sounds: Normal breath sounds.  Abdominal:     General: Abdomen is flat. Bowel sounds are normal. There is no distension.  Skin:    General: Skin is warm and dry.     Ancillary tests (personally reviewed)  CBC: Recent Labs  Lab 01/22/19 1030 01/25/19 0555  01/25/19 1155 01/26/19 0422 01/27/19 0516  WBC 5.2 6.1 5.7 5.3 4.6  NEUTROABS  --  4.1  --   --   --   HGB 8.4* 10.3* 9.5* 10.5* 9.6*  HCT 27.3* 32.3* 29.5* 33.1* 30.7*  MCV 94.1 93.6 92.5 94.0 93.6  PLT 343 328 376 334 546    Basic Metabolic Panel: Recent Labs  Lab 01/22/19 1030 01/23/19 0504 01/25/19 0555 01/25/19 1155 01/26/19 0422 01/27/19 0516  NA 137  --  136 133* 134* 135  K 3.4*  --  3.5 3.3* 3.8 3.3*  CL 101  --  104 108 100 103  CO2 27  --  23 20* 22 21*  GLUCOSE 123*  --  126* 138* 114* 157*  BUN 13  --  19 20 24* 28*  CREATININE 0.55 0.62 0.56 0.58 0.57 0.71  CALCIUM 8.5*  --  8.7* 7.9* 9.1 8.1*  MG  --   --  1.7  --  1.7  --   PHOS  --   --  2.8  --   --   --    GFR: Estimated Creatinine Clearance: 54.4 mL/min (by C-G formula based on SCr of 0.71 mg/dL). Recent Labs  Lab 01/25/19 0555 01/25/19 1155 01/26/19 0422 01/27/19 0516  WBC 6.1 5.7 5.3 4.6    Liver Function Tests:  Recent Labs  Lab 01/26/19 0422 01/27/19 0516  AST 49* 47*  ALT 83* 80*  ALKPHOS 99 89  BILITOT 0.3 0.3  PROT 6.7 6.0*  ALBUMIN 2.0* 1.9*   No results for input(s): LIPASE, AMYLASE in the last 168 hours. Recent Labs  Lab 01/26/19 0422  AMMONIA 34    ABG    Component Value Date/Time   PHART 7.380 01/03/2019 0051   PCO2ART 44.8 01/03/2019 0051   PO2ART 432.0 (H) 01/03/2019 0051   HCO3 26.6 01/03/2019 0051   TCO2 28 01/03/2019 0051   O2SAT 100.0 01/03/2019 0051     Coagulation Profile: Recent Labs  Lab 01/27/19 0516  INR 1.2    Cardiac Enzymes: No results for input(s): CKTOTAL, CKMB, CKMBINDEX, TROPONINI in the last 168 hours.   CBG: Recent Labs  Lab 01/26/19 1635 01/26/19 2012 01/27/19 0026 01/27/19 0423 01/27/19 0714  GLUCAP 155* 107* 133* 146* 120*     Assessment & Plan:   Respiratory failure, required mechanical ventilation 01/24/2019-has been off the ventilator, on ATM Continue pulmonary toilet  History of prolonged encephalopathy, was  unable to protect airway secondary to super refractory status epilepticus Tracheostomy placed 01/21/2019 Continue trach collar Will require LTAC placement   Seizure disorder On anticonvulsants Neurology following Continue antiseizure meds Questionable neurological recovery LTAC placement will be appropriate   Subtle degree of hypoxic ischemic encephalopathy likely Seizure disorder on anticonvulsants - Prior CVA with left-sided weakness.  PLAN: Neurology following Continue antiseizure medication as ordered Unclear if neurological recovery is possible Consider transferring to progressive care unit with ultimate goal LTAC placement  Best practice:  Diet: On tube feeds VAP protocol (if indicated): bundle in place. DVT prophylaxis: Lovenox GI prophylaxis: Pantoprazole Urinary catheter: required for skin breakdown Glucose control: Phase 1.  Code Status: Full Family Communication: Daughter last updated prior to tracheostomy. Disposition: Ready for LTAC placement, LTAC has been denied per insurance on 01/24/2019   Sherrilyn Rist, MD 01/27/2019, 8:01 AM

## 2019-01-27 NOTE — Progress Notes (Signed)
Patient having coughing/gagging episode after meds pushed through cortrak.  Patient coughing up mucous through trach that appears to contain tube feed.  Dr. Sharon Seller contacted, orders received to D/C tube feeds and obtain abd xray to verify placement.  Will continue to monitor closely.

## 2019-01-27 NOTE — Progress Notes (Signed)
Pharmacy Antibiotic Note  Erika Wang is a 68 y.o. female admitted on 01/02/2019 with aspiration pneumonia. Pt has been here since 3/5. She is now post vent support. She previous received a 6d course of unasyn for asp PNA. Zosyn has been ordered to asp PNA again. She had a tm 100.1 yesterday. CrCl normal at 55 ml/min.   Plan: Zosyn 3.375 IV x1 then q8 extended infusion  Height: 5' (152.4 cm) Weight: 123 lb 14.4 oz (56.2 kg) IBW/kg (Calculated) : 45.5  Temp (24hrs), Avg:98.9 F (37.2 C), Min:98 F (36.7 C), Max:100.1 F (37.8 C)  Recent Labs  Lab 01/22/19 1030 01/23/19 0504 01/25/19 0555 01/25/19 1155 01/26/19 0422 01/27/19 0516  WBC 5.2  --  6.1 5.7 5.3 4.6  CREATININE 0.55 0.62 0.56 0.58 0.57 0.71    Estimated Creatinine Clearance: 54.4 mL/min (by C-G formula based on SCr of 0.71 mg/dL).    Allergies  Allergen Reactions  . Ace Inhibitors Other (See Comments)    Acute renal failure    Antimicrobials this admission: Unasyn 3/5 >> 3/11 Ceftriaxone 3/5 x 1  Metronidazole 3/5 x 1  Dose adjustments this admission: N/A  Microbiology results: 3/5 BCx: Staph epi pan sens except for tetracycline 3/6 bcx>>neg 3/30 TA>>gram stain showed GVR, GPC   Ulyses Southward, PharmD, BCIDP, AAHIVP, CPP Infectious Disease Pharmacist 01/27/2019 5:21 PM

## 2019-01-28 ENCOUNTER — Inpatient Hospital Stay (HOSPITAL_COMMUNITY): Payer: Medicare HMO

## 2019-01-28 LAB — GLUCOSE, CAPILLARY
Glucose-Capillary: 103 mg/dL — ABNORMAL HIGH (ref 70–99)
Glucose-Capillary: 108 mg/dL — ABNORMAL HIGH (ref 70–99)
Glucose-Capillary: 126 mg/dL — ABNORMAL HIGH (ref 70–99)
Glucose-Capillary: 128 mg/dL — ABNORMAL HIGH (ref 70–99)
Glucose-Capillary: 139 mg/dL — ABNORMAL HIGH (ref 70–99)
Glucose-Capillary: 146 mg/dL — ABNORMAL HIGH (ref 70–99)
Glucose-Capillary: 99 mg/dL (ref 70–99)

## 2019-01-28 LAB — BASIC METABOLIC PANEL
Anion gap: 10 (ref 5–15)
BUN: 28 mg/dL — AB (ref 8–23)
CO2: 21 mmol/L — ABNORMAL LOW (ref 22–32)
Calcium: 9 mg/dL (ref 8.9–10.3)
Chloride: 102 mmol/L (ref 98–111)
Creatinine, Ser: 0.95 mg/dL (ref 0.44–1.00)
GFR calc Af Amer: >60 mL/min (ref >60)
GFR calc non Af Amer: >60 mL/min (ref >60)
Glucose, Bld: 115 mg/dL — ABNORMAL HIGH (ref 70–99)
POTASSIUM: 4.3 mmol/L (ref 3.5–5.1)
Sodium: 133 mmol/L — ABNORMAL LOW (ref 135–145)

## 2019-01-28 LAB — MAGNESIUM: Magnesium: 2.6 mg/dL — ABNORMAL HIGH (ref 1.7–2.4)

## 2019-01-28 MED ORDER — PHENYTOIN 125 MG/5ML PO SUSP
75.0000 mg | Freq: Three times a day (TID) | ORAL | Status: DC
  Administered 2019-01-28 – 2019-01-31 (×9): 75 mg
  Filled 2019-01-28 (×10): qty 4

## 2019-01-28 MED ORDER — JEVITY 1.5 CAL/FIBER PO LIQD
1000.0000 mL | ORAL | Status: DC
  Administered 2019-01-28 – 2019-01-29 (×2): 1000 mL
  Filled 2019-01-28 (×4): qty 1000

## 2019-01-28 MED ORDER — LACOSAMIDE 50 MG PO TABS
200.0000 mg | ORAL_TABLET | Freq: Two times a day (BID) | ORAL | Status: DC
  Administered 2019-01-28 – 2019-01-31 (×6): 200 mg
  Filled 2019-01-28 (×6): qty 4

## 2019-01-28 MED ORDER — LEVETIRACETAM 100 MG/ML PO SOLN
1500.0000 mg | Freq: Two times a day (BID) | ORAL | Status: DC
  Administered 2019-01-28 – 2019-01-31 (×6): 1500 mg
  Filled 2019-01-28 (×6): qty 15

## 2019-01-28 MED ORDER — FREE WATER: 200.0000 mL | Freq: Four times a day (QID) | Status: DC

## 2019-01-28 MED ORDER — PANTOPRAZOLE SODIUM 40 MG PO PACK
40.0000 mg | PACK | Freq: Every day | ORAL | Status: DC
  Administered 2019-01-28 – 2019-01-30 (×3): 40 mg
  Filled 2019-01-28 (×3): qty 20

## 2019-01-28 NOTE — Progress Notes (Signed)
[facility] TEAM 1 - Stepdown/ICU TEAM  Omelia Blackwater  BOF:[id number] DOB: 06-24-1951 DOA: 01/02/2019 PCP: Patient, No Pcp Per    Brief Narrative:  68yo admitted w/ AMS due to status epilepticus. She required high dose sedative infusions to gain control, which has lead to prolonged encephalopathy.    Subjective: Suffered an apparent malposition of her Cortrak 3/30, w/ resp distress due to suspected aspiration event. Required return to vent support. Prior Cortrak removed and new tube placed/position confirmed. Stabilizing on vent at this time. CXR notes new LLL infiltrate.   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 - med titration as per Neurology w/ current plan of adding topiramate slowly and once on a theraputic dose weaning keppra as spelled out in their last note   Hypoxic ischemic encephalopathy Cont supportive care - expected to require significantly more time to see improvement, if at all - will clearly require long term supportive facility for ongoing medical care (SNF)  Aspiration PNA - recurrent 3/30 Completed a 5 day course of Unasyn w/ initial event - now on Zosyn - plan for short course of only 3-5 days if she continues to improve clinically   Nutrition  Cortrak dependent presently - will need eventual conversion to PEG as she will not recover swallow soon enough to utilize only the Cortrak - resume TF per Cortrak today and follow for tolerance to help determine if PEG sufficient or if pt will need more distal feeding tube   Mild Hypokalemia  Corrected - follow intermittently   Mild Hypomagnesemia Corrected - follow intermittently   Normocytic anemia  Likely simply due to critical illness - anemia panel c/w this - check Hgb  intermittently   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 rate 17, height 5' (1.524 m), weight 54.5 kg, SpO2 100 %.  Intake/Output Summary (Last 24 hours) at 01/28/2019 1010 Last data filed at 01/28/2019 0800 Gross per 24 hour  Intake 1553.58 ml  Output 1276 ml  Net 277.58 ml   Filed Weights   01/26/19 0500 01/27/19 0500 01/28/19 0500  Weight: 55 kg 56.2 kg 54.5 kg    Examination: General: non-communicative - appears comfortable on vent  Lungs: bibasilar crackles - no wheezing   Cardiovascular: RRR w/o M or rub   Abdomen: NT/ND, soft, bs+ Extremities: trace B LE edema w/o change   CBC: Recent Labs  Lab 01/25/19 0555 01/25/19 1155 01/26/19 0422 01/27/19 0516  WBC 6.1 5.7 5.3 4.6  NEUTROABS 4.1  --   --   --   HGB 10.3* 9.5* 10.5* 9.6*  HCT 32.3* 29.5* 33.1* 30.7*  MCV 93.6 92.5 94.0 93.6  PLT 328 376 334 027   Basic Metabolic Panel: Recent Labs  Lab 01/25/19 0555  01/26/19 0422 01/27/19 0516 01/28/19 0510  NA 136   < > 134* 135 133*  K 3.5   < > 3.8 3.3* 4.3  CL 104   < > 100 103 102  CO2 23   < > 22 21* 21*  GLUCOSE 126*   < > 114* 157* 115*  BUN 19   < > 24* 28* 28*  CREATININE 0.56   < > 0.57 0.71 0.95  CALCIUM 8.7*   < > 9.1 8.1* 9.0  MG 1.7  --  1.7  --  2.6*  PHOS 2.8  --   --   --   --    < > = values in this interval not displayed.   GFR: Estimated Creatinine Clearance: 41.8 mL/min (by  C-G formula based on SCr of 0.95 mg/dL).  Liver Function Tests: Recent Labs  Lab 01/26/19 0422 01/27/19 0516  AST 49* 47*  ALT 83* 80*  ALKPHOS 99 89  BILITOT 0.3 0.3  PROT 6.7 6.0*  ALBUMIN 2.0* 1.9*    Recent Labs  Lab 01/26/19 0422  AMMONIA 34    Coagulation Profile: Recent Labs  Lab 01/27/19 0516  INR 1.2    HbA1C: Hgb A1c MFr Bld  Date/Time Value Ref Range Status  01/03/2019 02:39 AM 5.9 (H) 4.8 - 5.6 % Final    Comment:    (NOTE) Pre diabetes:          5.7%-6.4% Diabetes:              >6.4% Glycemic control for   <7.0% adults with diabetes   09/29/2013 04:15 AM 5.9 (H) <5.7 % Final    Comment:    (NOTE)                                                                       According to the ADA Clinical Practice Recommendations for 2011, when HbA1c is used as a screening test:  >=6.5%   Diagnostic of Diabetes Mellitus           (if abnormal result is confirmed) 5.7-6.4%   Increased risk of developing Diabetes Mellitus References:Diagnosis and Classification of Diabetes Mellitus,Diabetes OZDG,6440,34(VQQVZ 1):S62-S69 and Standards of Medical Care in         Diabetes - 2011,Diabetes Care,2011,34 (Suppl 1):S11-S61.    CBG: Recent Labs  Lab 01/27/19 1626 01/27/19 1941 01/27/19 2358 01/28/19 0441 01/28/19 0714  GLUCAP 146* 152* 128* 103* 108*     Scheduled Meds: . amantadine  100 mg Per Tube Daily  . chlorhexidine  15 mL Mouth Rinse BID  . chlorhexidine gluconate (MEDLINE KIT)  15 mL Mouth Rinse BID  . diltiazem  60 mg Per Tube Q6H  . enoxaparin (LOVENOX) injection  40 mg Subcutaneous Q24H  . feeding supplement (PRO-STAT SUGAR FREE 64)  30 mL Per Tube Daily  . free water  200 mL Per Tube Q6H  . Gerhardt's butt cream   Topical BID  . insulin aspart  0-15 Units Subcutaneous Q4H  . mouth rinse  15 mL Mouth Rinse q12n4p  . mouth rinse  15 mL Mouth Rinse  10 times per day  . multivitamin  15 mL Per Tube Daily  . pantoprazole (PROTONIX) IV  40 mg  Intravenous Q24H  . phenytoin (DILANTIN) IV  75 mg Intravenous Q8H  . [START ON 02/05/2019] topiramate  100 mg Per Tube BID  . topiramate  50 mg Per Tube BID  . [START ON 01/29/2019] topiramate  75 mg Per Tube BID     LOS: 26 days   Cherene Altes, MD [facility] Office  [phone] Pager - Text Page per Amion  If 7PM-7AM, please contact night-coverage per Amion 01/28/2019, 10:10 AM

## 2019-01-28 NOTE — Progress Notes (Signed)
NAMEMoesha Wang, MRN:  [id number], DOB:  1950/12/15, LOS: 26 ADMISSION DATE:  01/02/2019, CONSULTATION DATE:  01/02/2019 REFERRING MD:  TRH, CHIEF COMPLAINT:  Status epilepticus.   HPI/course in hospital  68 year old woman with AMS due to status epilepticus. Required high dose sedative infusions to gain control. Prolonged encephalopathy since then, slow to awake and follow commands.  Started on amantadine with limited improvement. Percutaneous tracheostomy placed 3/23.  Hospital course:  3/5 admit to floor, then ICU transfer.  3/6 intubated and on burst suppression per Neurology 3/8 versed stopped. Blood cultures cleared.  3/9 propofol stopped.  No further seizures 3/12 now off sedation X 72 hours no sig improvement  3/13: Withdrawing to pain, otherwise no significant changes 3/14: Following commands intermittently 3/15: Much more awake, following commands still.  Opening eyes to request.  Starting pressure support trials 3/18: Still sedated, Following commands. Poor effort on SBT 3/23: Trach placed 3/28: Picked up by internal medicine 3/30: Had been on aerosol trach collar, mental status still with limited exam.  Had episode of possible aspiration resulting in tachypnea, increased respiratory effort and ultimately being placed back on ventilator Past Medical History  Hypertension, history of stroke, history of hyperlipidemia.    Interim history/subjective:    Objective   Blood pressure 126/75, pulse 87, temperature 98.5 F (36.9 C), resp. rate 17, height 5' (1.524 m), weight 54.5 kg, SpO2 100 %.    Vent Mode: PRVC FiO2 (%):  [28 %-50 %] 40 % Set Rate:  [14 bmp] 14 bmp Vt Set:  [360 mL] 360 mL PEEP:  [5 cmH20] 5 cmH20 Plateau Pressure:  [15 cmH20] 15 cmH20   Intake/Output Summary (Last 24 hours) at 01/28/2019 0818 Last data filed at 01/28/2019 0600 Gross per 24 hour  Intake 1553.58 ml  Output 1475 ml  Net 78.58 ml   Filed Weights   01/26/19 0500 01/27/19 0500 01/28/19  0500  Weight: 55 kg 56.2 kg 54.5 kg   Examination: Physical Exam Vitals signs and nursing note reviewed.  Constitutional:      General: She is not in acute distress.    Appearance: She is not ill-appearing, toxic-appearing or diaphoretic.  HENT:     Head: Normocephalic.     Nose: Nose normal. No congestion.     Mouth/Throat:     Mouth: Mucous membranes are moist.  Eyes:     Pupils: Pupils are equal, round, and reactive to light.  Neck:     Musculoskeletal: Normal range of motion.     Comments: Tracheostomy sites unremarkable Cardiovascular:     Rate and Rhythm: Normal rate and regular rhythm.     Pulses: Normal pulses.     Heart sounds: No murmur. No gallop.   Pulmonary:     Effort: Pulmonary effort is normal. No respiratory distress.     Breath sounds: Normal breath sounds. No wheezing or rales.  Abdominal:     General: Abdomen is flat. Bowel sounds are normal.     Palpations: Abdomen is soft.  Musculoskeletal: Normal range of motion.  Skin:    General: Skin is warm and dry.     Capillary Refill: Capillary refill takes less than 2 seconds.      Assessment & Plan:   Respiratory failure, required mechanical ventilation Tracheostomy dependence  possible aspiration event 3/30 Portable chest x-ray personally reviewed: This demonstrates the tracheostomy in satisfactory position as is the PICC line.  Comparing films there is new left basilar volume loss/atelectasis/airspace disease there  is also persistent right basilar atelectasis -Fever spike yesterday on 3/30  plan Resume pressure support ventilation today Reassess for trach collar trials in a.m. Day #2 Zosyn Follow-up sputum culture   History of prolonged encephalopathy, secondary to super refractory status epilepticus Plan To new current anticonvulsants Will need LTAC placement  Severe deconditioning Plan Continuing passive range of motion  Dysphasia Plan Continue tube feeds Will likely need PEG  Best  practice:  Diet: On tube feeds VAP protocol (if indicated): bundle in place. DVT prophylaxis: Lovenox GI prophylaxis: Pantoprazole Urinary catheter: required for skin breakdown Glucose control: Phase 1.  Code Status: Full Disposition: Awaiting LTAC  Simonne Martinet ACNP-BC [facility] Pulmonary/Critical Care Pager # [phone] OR # [phone] if no answer

## 2019-01-28 NOTE — Progress Notes (Signed)
Physical Therapy Treatment Patient Details Name: Erika Wang MRN: [id number] DOB: 1951/09/07 Today's Date: 01/28/2019    History of Present Illness  Erika Wang is a 69 y.o. 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 EEG found patient to have seizure like activity. Pt remains unresponsive, intubated on 3/6 and now has a tracheostomy on 3/23.    PT Comments    No purposeful responses from pt again today with noxious or other stimuli.   Follow Up Recommendations  LTACH     Equipment Recommendations  None recommended by PT    Recommendations for Other Services       Precautions / Restrictions Precautions Precaution Comments: pt with trach and on vent    Mobility  Bed Mobility Overal bed mobility: Needs Assistance Bed Mobility: Supine to Sit;Sit to Supine     Supine to sit: Total assist;+2 for physical assistance;HOB elevated Sit to supine: Total assist;+2 for physical assistance   General bed mobility comments: pivoted with bed pads, up to EOB without pt assist.  Transfers                    Ambulation/Gait                 Stairs             Wheelchair Mobility    Modified Rankin (Stroke Patients Only) Modified Rankin (Stroke Patients Only) Pre-Morbid Rankin Score: No symptoms Modified Rankin: Severe disability     Balance Overall balance assessment: Needs assistance Sitting-balance support: Feet supported;Feet unsupported;Single extremity supported Sitting balance-Leahy Scale: Poor Sitting balance - Comments: sat EOB x10 min trying for truncal or any response.   Completed PROM to all 4's while in sitting at EOB.  Did not notice any purposeful responses                                      Cognition Arousal/Alertness: Lethargic Behavior During Therapy: Flat  affect Overall Cognitive Status: Impaired/Different from baseline Area of Impairment: Following commands                               General Comments: pt did not respond purposefully throughout treatment      Exercises Other Exercises Other Exercises: PROM to bil LE's    General Comments        Pertinent Vitals/Pain Pain Assessment: Faces Faces Pain Scale: No hurt    Home Living                      Prior Function            PT Goals (current goals can now be found in the care plan section) Acute Rehab PT Goals Patient Stated Goal: didn't state PT Goal Formulation: Patient unable to participate in goal setting Time For Goal Achievement: 02/04/19 Potential to Achieve Goals: Fair Progress towards PT goals: Not progressing toward goals - comment    Frequency    Min 2X/week      PT Plan Current plan remains appropriate    Co-evaluation              AM-PAC PT "6 Clicks" Mobility   Outcome Measure  Help needed  turning from your back to your side while in a flat bed without using bedrails?: Total Help needed moving from lying on your back to sitting on the side of a flat bed without using bedrails?: Total Help needed moving to and from a bed to a chair (including a wheelchair)?: Total Help needed standing up from a chair using your arms (e.g., wheelchair or bedside chair)?: Total Help needed to walk in hospital room?: Total Help needed climbing 3-5 steps with a railing? : Total 6 Click Score: 6    End of Session Equipment Utilized During Treatment: Oxygen Activity Tolerance: Patient tolerated treatment well Patient left: in bed;with call bell/phone within reach Nurse Communication: Mobility status PT Visit Diagnosis: Muscle weakness (generalized) (M62.81);Other symptoms and signs involving the nervous system (R29.898);Difficulty in walking, not elsewhere classified (R26.2)     Time: 7824-2353 PT Time Calculation (min) (ACUTE  ONLY): 20 min  Charges:  $Therapeutic Activity: 8-22 mins                     01/28/2019  Erika Wang, PT Acute Rehabilitation Services [phone]  (pager) [phone]  (office)   Erika Wang Erika Wang 01/28/2019, 6:29 PM

## 2019-01-28 NOTE — Progress Notes (Signed)
SLP Cancellation Note  Patient Details Name: Erika Wang MRN: [id number] DOB: 13-Mar-1951   Cancelled treatment:       Reason Eval/Treat Not Completed: Medical issues which prohibited therapy. Patient put back on vent on 3/30 due to emesis episode and suspected aspiration, likely due to cortrak found to be out of position. CXR revealed new LLL infiltrate. Cortrak has been replaced and expectation is for short term vent support. Will follow for readiness.   Angela Nevin, MA, CCC-SLP Speech Therapy [facility] Acute Rehab Pager: [phone]

## 2019-01-28 NOTE — Care Management (Signed)
Noted pt placed back on ventilator last pm due to aspiration event.  Reached out to [facility] admission coordinator; facility plans to initiate an appeal letter to insurance company as a last effort for possible LTAC admission.  Clinical update faxed to admissions liaison; will follow with updates as available.    Quintella Baton, RN, BSN  Trauma/Neuro ICU Case Manager [phone]

## 2019-01-29 DIAGNOSIS — G8194 Hemiplegia, unspecified affecting left nondominant side: Secondary | ICD-10-CM

## 2019-01-29 LAB — COMPREHENSIVE METABOLIC PANEL
ALT: 88 U/L — ABNORMAL HIGH (ref 0–44)
AST: 56 U/L — ABNORMAL HIGH (ref 15–41)
Albumin: 1.9 g/dL — ABNORMAL LOW (ref 3.5–5.0)
Alkaline Phosphatase: 98 U/L (ref 38–126)
Anion gap: 12 (ref 5–15)
BUN: 17 mg/dL (ref 8–23)
CO2: 23 mmol/L (ref 22–32)
Calcium: 9.3 mg/dL (ref 8.9–10.3)
Chloride: 103 mmol/L (ref 98–111)
Creatinine, Ser: 0.9 mg/dL (ref 0.44–1.00)
GFR calc Af Amer: >60 mL/min (ref >60)
GFR calc non Af Amer: >60 mL/min (ref >60)
Glucose, Bld: 125 mg/dL — ABNORMAL HIGH (ref 70–99)
POTASSIUM: 3.5 mmol/L (ref 3.5–5.1)
Sodium: 138 mmol/L (ref 135–145)
Total Bilirubin: 0.3 mg/dL (ref 0.3–1.2)
Total Protein: 6.7 g/dL (ref 6.5–8.1)

## 2019-01-29 LAB — CBC
HCT: 30.7 % — ABNORMAL LOW (ref 36.0–46.0)
Hemoglobin: 9.7 g/dL — ABNORMAL LOW (ref 12.0–15.0)
MCH: 29.8 pg (ref 26.0–34.0)
MCHC: 31.6 g/dL (ref 30.0–36.0)
MCV: 94.2 fL (ref 80.0–100.0)
Platelets: 304 10*3/uL (ref 150–400)
RBC: 3.26 MIL/uL — ABNORMAL LOW (ref 3.87–5.11)
RDW: 15.9 % — AB (ref 11.5–15.5)
WBC: 6.6 10*3/uL (ref 4.0–10.5)
nRBC: 0 % (ref 0.0–0.2)

## 2019-01-29 LAB — CULTURE, RESPIRATORY W GRAM STAIN: Culture: NORMAL

## 2019-01-29 LAB — GLUCOSE, CAPILLARY
Glucose-Capillary: 118 mg/dL — ABNORMAL HIGH (ref 70–99)
Glucose-Capillary: 134 mg/dL — ABNORMAL HIGH (ref 70–99)
Glucose-Capillary: 140 mg/dL — ABNORMAL HIGH (ref 70–99)
Glucose-Capillary: 138 mg/dL — ABNORMAL HIGH (ref 70–99)
Glucose-Capillary: 147 mg/dL — ABNORMAL HIGH (ref 70–99)

## 2019-01-29 MED ORDER — POTASSIUM CHLORIDE 20 MEQ PO PACK
50.0000 meq | PACK | Freq: Once | ORAL | Status: AC
  Administered 2019-01-29: 10:00:00 50 meq
  Filled 2019-01-29: qty 3

## 2019-01-29 NOTE — Progress Notes (Signed)
NAMEAngenette Schrand, MRN:  [id number], DOB:  Mar 26, 1951, LOS: 27 ADMISSION DATE:  01/02/2019, CONSULTATION DATE:  01/02/2019 REFERRING MD:  TRH, CHIEF COMPLAINT:  Status epilepticus.   HPI/course in hospital  68 year old woman with AMS due to status epilepticus. Required high dose sedative infusions to gain control. Prolonged encephalopathy since then, slow to awake and follow commands.  Started on amantadine with limited improvement. Percutaneous tracheostomy placed 3/23.  Hospital course:  3/5 admit to floor, then ICU transfer.  3/6 intubated and on burst suppression per Neurology 3/8 versed stopped. Blood cultures cleared.  3/9 propofol stopped.  No further seizures 3/12 now off sedation X 72 hours no sig improvement  3/13: Withdrawing to pain, otherwise no significant changes 3/14: Following commands intermittently 3/15: Much more awake, following commands still.  Opening eyes to request.  Starting pressure support trials 3/18: Still sedated, Following commands. Poor effort on SBT 3/23: Trach placed 3/28: Picked up by internal medicine 3/30: Had been on aerosol trach collar, mental status still with limited exam.  Had episode of possible aspiration resulting in tachypnea, increased respiratory effort and ultimately being placed back on ventilator Past Medical History  Hypertension, history of stroke, history of hyperlipidemia.    Interim history/subjective:  No issues over night   Objective   Blood pressure (Abnormal) 144/88, pulse 83, temperature 99.2 F (37.3 C), temperature source Axillary, resp. rate 20, height 5' (1.524 m), weight 56.7 kg, SpO2 100 %.    Vent Mode: PRVC FiO2 (%):  [40 %] 40 % Set Rate:  [14 bmp] 14 bmp Vt Set:  [360 mL] 360 mL PEEP:  [5 cmH20] 5 cmH20 Plateau Pressure:  [15 cmH20-16 cmH20] 16 cmH20   Intake/Output Summary (Last 24 hours) at 01/29/2019 3785 Last data filed at 01/29/2019 0510 Gross per 24 hour  Intake 421.56 ml  Output 1125 ml  Net  -703.44 ml   Filed Weights   01/27/19 0500 01/28/19 0500 01/29/19 0500  Weight: 56.2 kg 54.5 kg 56.7 kg   Examination: Physical Exam Constitutional:      General: She is not in acute distress.    Appearance: She is not ill-appearing, toxic-appearing or diaphoretic.  HENT:     Head: Normocephalic and atraumatic.     Nose: Nose normal.     Mouth/Throat:     Mouth: Mucous membranes are moist.  Eyes:     Conjunctiva/sclera: Conjunctivae normal.     Pupils: Pupils are equal, round, and reactive to light.  Neck:     Musculoskeletal: Normal range of motion.     Comments: Janina Mayo is unremarkable  Cardiovascular:     Rate and Rhythm: Normal rate and regular rhythm.     Heart sounds: No murmur. No gallop.   Pulmonary:     Effort: Pulmonary effort is normal.     Breath sounds: Rhonchi present.  Abdominal:     General: Abdomen is flat. Bowel sounds are normal. There is no distension.  Musculoskeletal:        General: Swelling present.  Skin:    General: Skin is warm and dry.     Capillary Refill: Capillary refill takes less than 2 seconds.  Neurological:     Motor: Weakness present.     Comments: Withdraws and localizes. Does not f/c      Assessment & Plan:   Respiratory failure, required mechanical ventilation Tracheostomy dependence  possible aspiration event 3/30 Sputum still pending  plan Resume weaning and pressure support trial as tolerated  aiming towards aerosol trach collar  Team member 3 Zosyn follow-up culture  X-ray in a.m.    History of prolonged encephalopathy, secondary to super refractory status epilepticus Plan No change in anticonvulsants Needs LTAC  Severe deconditioning Plan Continue passive range of motion  Dysphasia Plan Continue tube feeds  Best practice:  Diet: On tube feeds VAP protocol (if indicated): bundle in place. DVT prophylaxis: Lovenox GI prophylaxis: Pantoprazole Urinary catheter: required for skin breakdown Glucose control:  Phase 1.  Code Status: Full Disposition: No change resume weaning efforts  Simonne Martinet ACNP-BC [facility] Pulmonary/Critical Care Pager # [phone] OR # [phone] if no answer

## 2019-01-29 NOTE — Progress Notes (Signed)
PROGRESS NOTE    Erika Wang  OHF:[id number] DOB: [birth year] DOA: 01/02/2019 PCP: Patient, No Pcp Per   Brief Narrative:  67yo BF PMHx CVA,with residual mild left-sided weakness, HTN, HLD,   Brought to the ED earlier today-after she was found by family on the floor.  She was last seen normal approximately 2 days back by her daughter-Per daughter-she was apparently slightly off balance but wise was in her usual state of health.  The daughter does not remember patient complaining about fever, headache, nausea, vomiting, abdominal pain or diarrhea.  Patient was not answering the daughter's phone calls, and apparently was also not come to the when the physical therapist went to see her-the daughter went to the patient's apartment manager and had the door open-patient was found lying on the floor covered with cat litter-with right gaze and unresponsive.  Patient was brought to the emergency room, and was found to have left-sided weakness, patient remained unresponsive-although had stable vital signs.  Neurology consultation was called-EEG was obtained-initial CT scans including of the head did not show any acute abnormalities.    EEG positive status epilepticus. She required high dose sedative infusions to gain control, which has lead to prolonged encephalopathy.      Subjective: 4/1 patient unresponsive except for withdrawal to painful stimuli around her neck and face.   Assessment & Plan:   Principal Problem:   Encephalopathy acute Active Problems:   Left hemiparesis ([place])   Encounter for intubation   Aspiration pneumonia ([place])   Acute respiratory failure with hypoxia ([place])   Status epilepticus ([place])   Community acquired pneumonia of right upper lobe of lung ([place])   Pressure injury of skin   Acute hypoxic respiratory failure - Require prolonged vent>> trach - Patient returned to vent support on 3/30 after aspiration episode - Currently on trach collar 28% FiO2  Hypoxic ischemic  encephalopathy - Supportive care - Expected to require significant time for improvement if at all - 4/1 consult for palliative care placed discuss change of CODE STATUS to DNR, hospice  Aspiration pneumonia recurrent 3/30 - Completed 5-day course of Unasyn with initial event - Now on Zosyn plan for short course 3 to 5 days   Super refractory status epilepticus -Due to his prior CVA  - Vimpat 200 mg BID - Keppra 1500 mg BID -Ativan as needed - Phenytoin 75 mg TID - Topamax 175 mg BID -Continue care per neurology  Nutrition - Core track x2 days -Continue tube feeds through core track - Social work consult: Patient will require PEG tube placement and evaluation for SNF placement   Mild Hypokalemia  -Potassium goal> 4 - Potassium 50 mEq per tube   Hypomagnesemia -Continue to monitor   Normocytic anemia  -Most likely secondary to critical illness, anemia panel consistent with this -Follow closely  Remote CVA -Right gaze preference and left hemiparesis reported, although patient unresponsive except to painful stimuli around face and neck.      DVT prophylaxis: Lovenox  code Status: Full Family Communication: None Disposition Plan: TBD   Consultants:  Neurology  Procedures/Significant Events:     I have personally reviewed and interpreted all radiology studies and my findings are as above.  VENTILATOR SETTINGS: Trach collar FiO2 28% Flow rate 5L/min   Cultures 3/5 blood positive STAPHYLOCOCCUS EPIDERMIDIS 2/2 cultures 3/6 influenza A/B negative 3/6 blood negative 3/6 tracheal aspirate consistent with normal flora 3/30 tracheal aspirate consistent normal flora    Antimicrobials: Anti-infectives (From admission, onward)   Start  Stop   01/28/19 0000  piperacillin-tazobactam (ZOSYN) IVPB 3.375 g         01/27/19 1800  piperacillin-tazobactam (ZOSYN) IVPB 3.375 g     01/27/19 1844   01/06/19 1800  Ampicillin-Sulbactam (UNASYN) 3 g in sodium chloride  0.9 % 100 mL IVPB     01/08/19 0425   01/03/19 1400  ceFAZolin (ANCEF) IVPB 2g/100 mL premix  Status:  Discontinued     01/03/19 0951   01/03/19 1000  Ampicillin-Sulbactam (UNASYN) 3 g in sodium chloride 0.9 % 100 mL IVPB  Status:  Discontinued     01/06/19 1033   01/02/19 1715  Ampicillin-Sulbactam (UNASYN) 3 g in sodium chloride 0.9 % 100 mL IVPB  Status:  Discontinued     01/03/19 0946   01/02/19 1545  metroNIDAZOLE (FLAGYL) IVPB 500 mg     01/03/19 0000   01/02/19 1515  cefTRIAXone (ROCEPHIN) 1 g in sodium chloride 0.9 % 100 mL IVPB     01/02/19 1722   01/02/19 1515  azithromycin (ZITHROMAX) 500 mg in sodium chloride 0.9 % 250 mL IVPB  Status:  Discontinued     01/02/19 1534       Devices 6 mm cuffed trach >>>   LINES / TUBES:  PIC double-lumen>> Rectal tube 3/31>> NG tube 3/30>>>    Continuous Infusions: . sodium chloride 250 mL (01/29/19 0751)  . feeding supplement (JEVITY 1.5 CAL/FIBER) 1,000 mL (01/28/19 1425)  . piperacillin-tazobactam (ZOSYN)  IV 3.375 g (01/29/19 0998)     Objective: Vitals:   01/29/19 0442 01/29/19 0500 01/29/19 0700 01/29/19 0825  BP:    128/72  Pulse:    82  Resp:    (!) 29  Temp:   99.2 F (37.3 C)   TempSrc:   Axillary   SpO2: 100%   97%  Weight:  56.7 kg    Height:        Intake/Output Summary (Last 24 hours) at 01/29/2019 0913 Last data filed at 01/29/2019 0510 Gross per 24 hour  Intake 421.56 ml  Output 1125 ml  Net -703.44 ml   Filed Weights   01/27/19 0500 01/28/19 0500 01/29/19 0500  Weight: 56.2 kg 54.5 kg 56.7 kg    Examination:  General: No acute respiratory distress, cachectic Eyes: negative scleral hemorrhage, negative anisocoria, negative icterus ENT: Negative Runny nose, negative gingival bleeding, Neck:  Negative scars, masses, torticollis, lymphadenopathy, JVD Lungs: Clear to auscultation bilaterally without wheezes or crackles Cardiovascular: Regular rate and rhythm without murmur gallop or rub normal S1  and S2 Abdomen: negative abdominal pain, nondistended, positive soft, bowel sounds, no rebound, no ascites, no appreciable mass Extremities: No significant cyanosis, clubbing, or edema bilateral lower extremities Skin: Negative rashes, lesions, ulcers Psychiatric: Unable to evaluate secondary to patient's altered mental status  Central nervous system: Unresponsive except for painful stimuli around the face and neck  .     Data Reviewed: Care during the described time interval was provided by me .  I have reviewed this patient's available data, including medical history, events of note, physical examination, and all test results as part of my evaluation.   CBC: Recent Labs  Lab 01/25/19 0555 01/25/19 1155 01/26/19 0422 01/27/19 0516 01/29/19 0500  WBC 6.1 5.7 5.3 4.6 6.6  NEUTROABS 4.1  --   --   --   --   HGB 10.3* 9.5* 10.5* 9.6* 9.7*  HCT 32.3* 29.5* 33.1* 30.7* 30.7*  MCV 93.6 92.5 94.0 93.6 94.2  PLT 328 376  334 305 423   Basic Metabolic Panel: Recent Labs  Lab 01/25/19 0555 01/25/19 1155 01/26/19 0422 01/27/19 0516 01/28/19 0510 01/29/19 0500  NA 136 133* 134* 135 133* 138  K 3.5 3.3* 3.8 3.3* 4.3 3.5  CL 104 108 100 103 102 103  CO2 23 20* 22 21* 21* 23  GLUCOSE 126* 138* 114* 157* 115* 125*  BUN 19 20 24* 28* 28* 17  CREATININE 0.56 0.58 0.57 0.71 0.95 0.90  CALCIUM 8.7* 7.9* 9.1 8.1* 9.0 9.3  MG 1.7  --  1.7  --  2.6*  --   PHOS 2.8  --   --   --   --   --    GFR: Estimated Creatinine Clearance: 48.5 mL/min (by C-G formula based on SCr of 0.9 mg/dL). Liver Function Tests: Recent Labs  Lab 01/26/19 0422 01/27/19 0516 01/29/19 0500  AST 49* 47* 56*  ALT 83* 80* 88*  ALKPHOS 99 89 98  BILITOT 0.3 0.3 0.3  PROT 6.7 6.0* 6.7  ALBUMIN 2.0* 1.9* 1.9*   No results for input(s): LIPASE, AMYLASE in the last 168 hours. Recent Labs  Lab 01/26/19 0422  AMMONIA 34   Coagulation Profile: Recent Labs  Lab 01/27/19 0516  INR 1.2   Cardiac Enzymes: No  results for input(s): CKTOTAL, CKMB, CKMBINDEX, TROPONINI in the last 168 hours. BNP (last 3 results) No results for input(s): PROBNP in the last 8760 hours. HbA1C: No results for input(s): HGBA1C in the last 72 hours. CBG: Recent Labs  Lab 01/28/19 1527 01/28/19 1943 01/28/19 2351 01/29/19 0442 01/29/19 0735  GLUCAP 139* 126* 99 118* 134*   Lipid Profile: No results for input(s): CHOL, HDL, LDLCALC, TRIG, CHOLHDL, LDLDIRECT in the last 72 hours. Thyroid Function Tests: No results for input(s): TSH, T4TOTAL, FREET4, T3FREE, THYROIDAB in the last 72 hours. Anemia Panel: No results for input(s): VITAMINB12, FOLATE, FERRITIN, TIBC, IRON, RETICCTPCT in the last 72 hours. Urine analysis:    Component Value Date/Time   COLORURINE YELLOW 01/02/2019 1127   APPEARANCEUR CLEAR 01/02/2019 1127   LABSPEC 1.021 01/02/2019 1127   PHURINE 5.0 01/02/2019 1127   GLUCOSEU NEGATIVE 01/02/2019 1127   HGBUR SMALL (A) 01/02/2019 1127   BILIRUBINUR NEGATIVE 01/02/2019 1127   KETONESUR 5 (A) 01/02/2019 1127   PROTEINUR 100 (A) 01/02/2019 1127   UROBILINOGEN 1.0 10/01/2013 0227   NITRITE NEGATIVE 01/02/2019 1127   LEUKOCYTESUR NEGATIVE 01/02/2019 1127   Sepsis Labs: _0 (procalcitonin:4,lacticidven:4)  ) Recent Results (from the past 240 hour(s))  Culture, respiratory (non-expectorated)     Status: None   Collection Time: 01/27/19  4:14 PM  Result Value Ref Range Status   Specimen Description TRACHEAL ASPIRATE  Final   Special Requests NONE  Final   Gram Stain   Final    ABUNDANT WBC PRESENT,BOTH PMN AND MONONUCLEAR FEW GRAM VARIABLE ROD RARE GRAM POSITIVE COCCI    Culture   Final    Consistent with normal respiratory flora. Performed at [facility], [place] [address] [place], [place] [address]    Report Status 01/29/2019 FINAL  Final         Radiology Studies: Portable Chest X-ray  Result Date: 01/28/2019 CLINICAL DATA:  Pneumonia EXAM: PORTABLE CHEST 1 VIEW  COMPARISON:  01/27/2019 FINDINGS: Tracheostomy, right PICC line remain in place, unchanged. Feeding tube is in place, projecting off the lower aspect of the film. The tip is not visualized. Increasing bibasilar atelectasis or infiltrates, left greater than right. Heart is borderline in  size. Suspect small left effusion. No acute bony abnormality. IMPRESSION: Bibasilar atelectasis or infiltrates, left greater than right, increasing since prior study. Suspect small left effusion. Electronically Signed   By: Rolm Baptise M.D.   On: 01/28/2019 07:20   Dg Chest Port 1 View  Result Date: 01/27/2019 CLINICAL DATA:  Aspiration.  History of stroke and hypertension. EXAM: PORTABLE CHEST 1 VIEW COMPARISON:  01/25/2019 and prior exams FINDINGS: Left base opacity is unchanged from the prior study. Mild left perihilar opacity appears new or increased, but this apparent change is most likely positional only. Mild streaky opacity at the right lung base consistent with atelectasis is stable. Remainder of the lungs is clear. Tracheostomy tube and right PICC are stable. IMPRESSION: 1. No significant interval change from the most recent prior study. 2. Left lung base opacity consistent with atelectasis or pneumonia/pneumonitis. Mild persistent right lung base atelectasis. Electronically Signed   By: Lajean Manes M.D.   On: 01/27/2019 15:42   Dg Abd Portable 1v  Result Date: 01/27/2019 CLINICAL DATA:  NG tube placement EXAM: PORTABLE ABDOMEN - 1 VIEW COMPARISON:  None. FINDINGS: There is no nasogastric tube identified. There is no bowel dilatation to suggest obstruction. There is no evidence of pneumoperitoneum, portal venous gas or pneumatosis. There are no pathologic calcifications along the expected course of the ureters. The osseous structures are unremarkable. IMPRESSION: No nasogastric tube identified. Electronically Signed   By: Kathreen Devoid   On: 01/27/2019 14:14        Scheduled Meds: . amantadine  100 mg Per  Tube Daily  . chlorhexidine  15 mL Mouth Rinse BID  . chlorhexidine gluconate (MEDLINE KIT)  15 mL Mouth Rinse BID  . diltiazem  60 mg Per Tube Q6H  . enoxaparin (LOVENOX) injection  40 mg Subcutaneous Q24H  . feeding supplement (PRO-STAT SUGAR FREE 64)  30 mL Per Tube Daily  . free water  200 mL Per Tube Q6H  . Gerhardt's butt cream   Topical BID  . insulin aspart  0-15 Units Subcutaneous Q4H  . lacosamide  200 mg Per Tube BID  . levETIRAcetam  1,500 mg Per Tube BID  . mouth rinse  15 mL Mouth Rinse q12n4p  . mouth rinse  15 mL Mouth Rinse 10 times per day  . multivitamin  15 mL Per Tube Daily  . pantoprazole sodium  40 mg Per Tube Daily  . phenytoin  75 mg Per Tube Q8H  . [START ON 02/05/2019] topiramate  100 mg Per Tube BID  . topiramate  75 mg Per Tube BID   Continuous Infusions: . sodium chloride 250 mL (01/29/19 0751)  . feeding supplement (JEVITY 1.5 CAL/FIBER) 1,000 mL (01/28/19 1425)  . piperacillin-tazobactam (ZOSYN)  IV 3.375 g (01/29/19 0814)     LOS: 27 days    Time spent: 40 minutes    , Geraldo Docker, MD [facility] Pager [phone]   If 7PM-7AM, please contact night-coverage [email or web address] Password [facility] 01/29/2019, 9:13 AM

## 2019-01-30 DIAGNOSIS — T17908A Unspecified foreign body in respiratory tract, part unspecified causing other injury, initial encounter: Secondary | ICD-10-CM

## 2019-01-30 DIAGNOSIS — Z7189 Other specified counseling: Secondary | ICD-10-CM

## 2019-01-30 DIAGNOSIS — B59 Pneumocystosis: Secondary | ICD-10-CM

## 2019-01-30 DIAGNOSIS — Z515 Encounter for palliative care: Secondary | ICD-10-CM

## 2019-01-30 LAB — CBC
HCT: 33.7 % — ABNORMAL LOW (ref 36.0–46.0)
Hemoglobin: 10.7 g/dL — ABNORMAL LOW (ref 12.0–15.0)
MCH: 29.2 pg (ref 26.0–34.0)
MCHC: 31.8 g/dL (ref 30.0–36.0)
MCV: 91.8 fL (ref 80.0–100.0)
Platelets: 300 10*3/uL (ref 150–400)
RBC: 3.67 MIL/uL — ABNORMAL LOW (ref 3.87–5.11)
RDW: 15.5 % (ref 11.5–15.5)
WBC: 4.5 10*3/uL (ref 4.0–10.5)
nRBC: 0 % (ref 0.0–0.2)

## 2019-01-30 LAB — BASIC METABOLIC PANEL WITH GFR
Anion gap: 9 (ref 5–15)
BUN: 13 mg/dL (ref 8–23)
CO2: 26 mmol/L (ref 22–32)
Calcium: 9.6 mg/dL (ref 8.9–10.3)
Chloride: 102 mmol/L (ref 98–111)
Creatinine, Ser: 0.69 mg/dL (ref 0.44–1.00)
Glucose, Bld: 147 mg/dL — ABNORMAL HIGH (ref 70–99)
Potassium: 4 mmol/L (ref 3.5–5.1)
Sodium: 137 mmol/L (ref 135–145)

## 2019-01-30 LAB — GLUCOSE, CAPILLARY
Glucose-Capillary: 114 mg/dL — ABNORMAL HIGH (ref 70–99)
Glucose-Capillary: 133 mg/dL — ABNORMAL HIGH (ref 70–99)
Glucose-Capillary: 134 mg/dL — ABNORMAL HIGH (ref 70–99)
Glucose-Capillary: 139 mg/dL — ABNORMAL HIGH (ref 70–99)
Glucose-Capillary: 139 mg/dL — ABNORMAL HIGH (ref 70–99)
Glucose-Capillary: 83 mg/dL (ref 70–99)
Glucose-Capillary: 94 mg/dL (ref 70–99)

## 2019-01-30 LAB — MAGNESIUM: Magnesium: 1.9 mg/dL (ref 1.7–2.4)

## 2019-01-30 MED ORDER — ORAL CARE MOUTH RINSE
15.0000 mL | Freq: Every day | OROMUCOSAL | Status: DC
  Administered 2019-01-31 (×5): 15 mL via OROMUCOSAL

## 2019-01-30 NOTE — Progress Notes (Signed)
Physical Therapy Treatment Patient Details Name: Erika Wang MRN: [id number] DOB: 1951/03/06 Today's Date: 01/30/2019    History of Present Illness  Erika Wang is a 68 y.o. 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 EEG found patient to have seizure like activity. Pt remains unresponsive, intubated on 3/6 and now has a tracheostomy on 3/23.    PT Comments    Still trying to elicit responses from pt.  Potentially 1 to 2 purposeful responses to command--opening eyes to command.  Emphasis on keeping pt with normal ROM, and bed mobility, sitting EOB working on truncal responses.    Follow Up Recommendations  LTACH     Equipment Recommendations  None recommended by PT    Recommendations for Other Services       Precautions / Restrictions Precautions Precautions: Fall Precaution Comments: pt now on 28% TC Restrictions Weight Bearing Restrictions: No    Mobility  Bed Mobility Overal bed mobility: Needs Assistance Bed Mobility: Supine to Sit;Rolling;Sit to Supine Rolling: Total assist   Supine to sit: Total assist Sit to supine: Total assist      Transfers                    Ambulation/Gait                 Stairs             Wheelchair Mobility    Modified Rankin (Stroke Patients Only) Modified Rankin (Stroke Patients Only) Pre-Morbid Rankin Score: No symptoms Modified Rankin: Severe disability     Balance Overall balance assessment: Needs assistance Sitting-balance support: Feet supported Sitting balance-Leahy Scale: Poor Sitting balance - Comments: sat 5 min working on eliciting truncal responses                                    Cognition Arousal/Alertness: Lethargic Behavior During Therapy: Flat affect Overall Cognitive Status: Impaired/Different from baseline(NT  formally, pt with 1-2 potential purposeful responses)                                 General Comments: pt opened eyes potentially to command on 1-2 occasions.      Exercises Other Exercises Other Exercises: PROM at Bil ankles/heel cords, knees, hips, hams, trunk rotation, hands/wrist/fingers, elbows, forearm sup/pron, shd's/scapula and neck.    General Comments General comments (skin integrity, edema, etc.): VSS on 28% TC      Pertinent Vitals/Pain Pain Assessment: Faces Faces Pain Scale: Hurts a little bit Pain Location: vague UE ROM pain Pain Descriptors / Indicators: Grimacing Pain Intervention(s): Monitored during session    Home Living                      Prior Function            PT Goals (current goals can now be found in the care plan section) Acute Rehab PT Goals Patient Stated Goal: didn't state PT Goal Formulation: Patient unable to participate in goal setting Time For Goal Achievement: 02/04/19 Potential to Achieve Goals: Fair Progress towards PT goals: Progressing toward goals(small progression.  ?starting to arouse alittle)    Frequency    Min 2X/week  PT Plan Current plan remains appropriate    Co-evaluation              AM-PAC PT "6 Clicks" Mobility   Outcome Measure  Help needed turning from your back to your side while in a flat bed without using bedrails?: Total Help needed moving from lying on your back to sitting on the side of a flat bed without using bedrails?: Total Help needed moving to and from a bed to a chair (including a wheelchair)?: Total Help needed standing up from a chair using your arms (e.g., wheelchair or bedside chair)?: Total Help needed to walk in hospital room?: Total Help needed climbing 3-5 steps with a railing? : Total 6 Click Score: 6    End of Session Equipment Utilized During Treatment: Oxygen Activity Tolerance: Patient tolerated treatment well Patient left: in bed;with call  bell/phone within reach Nurse Communication: Mobility status PT Visit Diagnosis: Muscle weakness (generalized) (M62.81);Other symptoms and signs involving the nervous system (R29.898);Hemiplegia and hemiparesis Hemiplegia - Right/Left: Left Hemiplegia - caused by: Cerebral infarction     Time: 8251-8984 PT Time Calculation (min) (ACUTE ONLY): 27 min  Charges:  $Therapeutic Exercise: 8-22 mins $Therapeutic Activity: 8-22 mins                     01/30/2019  [place] Bing, PT Acute Rehabilitation Services [phone]  (pager) [phone]  (office)   Eliseo Gum Vicy Medico 01/30/2019, 11:42 AM

## 2019-01-30 NOTE — Care Management Important Message (Signed)
Important Message  Patient Details  Name: Erika Wang MRN: [id number] Date of Birth: [birth year]   Medicare Important Message Given:  Yes    [name] 01/30/2019, 3:20 PM

## 2019-01-30 NOTE — Progress Notes (Signed)
  Speech Language Pathology Treatment: Erika Wang Speaking valve  Patient Details Name: Erika Wang MRN: [id number] DOB: 1951-03-16 Today's Date: 01/30/2019 Time: 4132-4401 SLP Time Calculation (min) (ACUTE ONLY): 8 min  Assessment / Plan / Recommendation Clinical Impression  Pt seen for PMV on trach collar. Per chart review it is suspected she aspirated tube feedings 3/30, required mechanical ventilation and has had poor responsiveness since. Currently she is tolerating trach collar. Despite max verbal and tactile stimulation pt was unable to open eyes or interact. PMV placed after located deflated cuff and finger occlusion indicated exhaled air although removed after 30 seconds with evidence of air trapping. Sensate to change in air flow with dry audible, non productive cough during session. Valve not functional at this time likely due to combination of possible deflated cuff not allowing sufficient air around trach and lethargy. Will continue to follow for use of valve to communicate needs verbally.    HPI HPI: Patient is a 68 y.o. female with PMH: high cholesterol, HTN, CVA in 2014 with residual mild lef-sided weakness, dyslipidemia, who was brought to ED after being found unresponsive on the floor and presented with refractory status epilepticus secondary to previous stroke. EEG found seizure like activity. Patient was intubated on 3/6 and tracheostomy on 01/20/19 with #6 cuffed [place].       SLP Plan  Continue with current plan of care       Recommendations         Patient may use Passy-Muir Speech Valve: with SLP only PMSV Supervision: Full         Oral Care Recommendations: Oral care QID Follow up Recommendations: Skilled Nursing facility;LTACH SLP Visit Diagnosis: Aphonia (R49.1) Plan: Continue with current plan of care                       Royce Macadamia 01/30/2019, 9:51 AM  Breck Coons Lonell Face.Ed Nurse, children's  [phone] Office [phone]

## 2019-01-30 NOTE — Consult Note (Signed)
Consultation Note Date: 01/30/2019   Patient Name: Erika Wang  DOB: [birth year]  MRN: [id number]  Age / Sex: 68 y.o., female  PCP: Patient, No Pcp Per Referring Physician: Allie Bossier, MD  Reason for Consultation: Establishing goals of care  HPI/Patient Profile: 68 y.o. female  with past medical history of CVA with residual left hemiparesis, HTN, HLD who was admitted on 01/02/2019 after being found down.  Initial work up revealed bacteremia and status epilepticus.  The status was felt to be "super refractory" and was finally controlled after several days with significant sedation.  Per Epic the patient remained lethargic and vent dependent.  She received a trach on 3/23.  At times she has been able to follow some commands.  On 3/30 she suffered an aspiration event due to a malpositioned cor trak and has declined.  She was responsive to only painful stimuli.  PMT consultation was recommended before PEG was ordered.  PEG will be necessary if patient is to go to SNF.  Clinical Assessment and Goals of Care:  I have reviewed medical records including EPIC notes, labs and imaging, received report from the care team, assessed the patient and then attempted to call her daughter Charlena Cross on the phone (at both numbers listed on face sheet)  to discuss diagnosis prognosis, GOC, EOL wishes, disposition and options.  Unfortunately I was only able to leave a voice mail for Wilder on her mobile number.  Her home number was not working at all.  On my exam the patient flinches when I touch her arm, keeps her eyes closed, and chews while I speak to her.  Primary Decision Maker:  NEXT OF KIN     SUMMARY OF RECOMMENDATIONS     Continue current care.  Left detailed message for daughter on voice mail requesting a call back.  Need to discuss Goals and rule out/in PEG tube with surrogate decision maker Hurman Horn) .   Confirmed Ebony's number with Case Management (they have reached her on [phone])  Code Status/Advance Care Planning:  FULL  Symptom Management:   Per primary team.  Additional Recommendations (Limitations, Scope, Preferences):  Full Scope Treatment  Palliative Prophylaxis:   Delirium Protocol  Psycho-social/Spiritual:   Desire for further Chaplaincy support:  Not yet discussed.  Prognosis: at full code/ full scope it is still highly likely that Mrs. Denne's prognosis is less than 6 months.  Given refractory status and aspiration pneumonia she is at high risk for an acute event that could end her life or at a minimum force re-hospitalization.    Discharge Planning: To Be Determined   Likely SNF with Palliative to follow.      Primary Diagnoses: Present on Admission: . Encephalopathy acute . Left hemiparesis ([place])   I have reviewed the medical record, interviewed the patient and family, and examined the patient. The following aspects are pertinent.  Past Medical History:  Diagnosis Date  . High cholesterol   . Hypertension   . Stroke [facility])    Social History  Socioeconomic History  . Marital status: Single    Spouse name: Not on file  . Number of children: Not on file  . Years of education: Not on file  . Highest education level: Not on file  Occupational History  . Not on file  Social Needs  . Financial resource strain: Not on file  . Food insecurity:    Worry: Not on file    Inability: Not on file  . Transportation needs:    Medical: Not on file    Non-medical: Not on file  Tobacco Use  . Smoking status: Former Smoker    Last attempt to quit: 09/28/2013    Years since quitting: 5.3  . Smokeless tobacco: Never Used  Substance and Sexual Activity  . Alcohol use: Never    Frequency: Never  . Drug use: Never  . Sexual activity: Never  Lifestyle  . Physical activity:    Days per week: Not on file    Minutes per session: Not on file  .  Stress: Not on file  Relationships  . Social connections:    Talks on phone: Not on file    Gets together: Not on file    Attends religious service: Not on file    Active member of club or organization: Not on file    Attends meetings of clubs or organizations: Not on file    Relationship status: Not on file  Other Topics Concern  . Not on file  Social History Narrative  . Not on file   Family History  Problem Relation Age of Onset  . Hypertension Mother   . Hypertension Father    Scheduled Meds: . amantadine  100 mg Per Tube Daily  . chlorhexidine  15 mL Mouth Rinse BID  . chlorhexidine gluconate (MEDLINE KIT)  15 mL Mouth Rinse BID  . diltiazem  60 mg Per Tube Q6H  . enoxaparin (LOVENOX) injection  40 mg Subcutaneous Q24H  . feeding supplement (PRO-STAT SUGAR FREE 64)  30 mL Per Tube Daily  . free water  200 mL Per Tube Q6H  . Gerhardt's butt cream   Topical BID  . insulin aspart  0-15 Units Subcutaneous Q4H  . lacosamide  200 mg Per Tube BID  . levETIRAcetam  1,500 mg Per Tube BID  . mouth rinse  15 mL Mouth Rinse 10 times per day  . multivitamin  15 mL Per Tube Daily  . pantoprazole sodium  40 mg Per Tube Daily  . phenytoin  75 mg Per Tube Q8H  . [START ON 02/05/2019] topiramate  100 mg Per Tube BID  . topiramate  75 mg Per Tube BID   Continuous Infusions: . sodium chloride 250 mL (01/29/19 0751)  . feeding supplement (JEVITY 1.5 CAL/FIBER) 1,000 mL (01/29/19 1530)  . piperacillin-tazobactam (ZOSYN)  IV 3.375 g (01/30/19 1016)   PRN Meds:.acetaminophen (TYLENOL) oral liquid 160 mg/5 mL, albuterol, docusate, hydrALAZINE, lidocaine, LORazepam, [DISCONTINUED] ondansetron **OR** ondansetron (ZOFRAN) IV, senna Allergies  Allergen Reactions  . Ace Inhibitors Other (See Comments)    Acute renal failure   Review of Systems non verbal  Physical Exam  Chronically ill appearing female, on trach collar, eyes closed, lethargic, chewing.  Cor trak in place CV rrr resp  bilateral rhonchi Abdomen soft, nt, nd  Vital Signs: BP 140/83   Pulse 87   Temp (!) 97.5 F (36.4 C) (Oral)   Resp (!) 21   Ht 5' (1.524 m)   Wt 56.7 kg  SpO2 100%   BMI 24.41 kg/m  Pain Scale: CPOT   Pain Score: 0-No pain   SpO2: SpO2: 100 % O2 Device:SpO2: 100 % O2 Flow Rate: .O2 Flow Rate (L/min): 5 L/min  IO: Intake/output summary:   Intake/Output Summary (Last 24 hours) at 01/30/2019 1507 Last data filed at 01/30/2019 0600 Gross per 24 hour  Intake 765.41 ml  Output 1450 ml  Net -684.59 ml    LBM: Last BM Date: (flexi seal and tube feeds) Baseline Weight: Weight: 59 kg Most recent weight: Weight: 56.7 kg     Palliative Assessment/Data: 10%     Time In: 3:00 Time Out: 3:50  Time Total: 50 min. Greater than 50%  of this time was spent counseling and coordinating care related to the above assessment and plan.  Signed by: Florentina Jenny, PA-C Palliative Medicine Pager: [phone]  Please contact Palliative Medicine Team phone at [phone] for questions and concerns.  For individual provider: See Shea Evans

## 2019-01-30 NOTE — Progress Notes (Signed)
NAMELatrease Wang, MRN:  [id number], DOB:  07-17-1951, LOS: 28 ADMISSION DATE:  01/02/2019, CONSULTATION DATE:  01/02/2019 REFERRING MD:  TRH, CHIEF COMPLAINT:  Status epilepticus.   HPI/course in hospital  68 year old woman with AMS due to status epilepticus. Required high dose sedative infusions to gain control. Prolonged encephalopathy since then, slow to awake and follow commands.  Started on amantadine with limited improvement. Percutaneous tracheostomy placed 3/23.  Hospital course:  3/5 admit to floor, then ICU transfer.  3/6 intubated and on burst suppression per Neurology 3/8 versed stopped. Blood cultures cleared.  3/9 propofol stopped.  No further seizures 3/12 now off sedation X 72 hours no sig improvement  3/13: Withdrawing to pain, otherwise no significant changes 3/14: Following commands intermittently 3/15: Much more awake, following commands still.  Opening eyes to request.  Starting pressure support trials 3/18: Still sedated, Following commands. Poor effort on SBT 3/23: Trach placed 3/28: Picked up by internal medicine 3/30: Had been on aerosol trach collar, mental status still with limited exam.  Had episode of possible aspiration resulting in tachypnea, increased respiratory effort and ultimately being placed back on ventilator Past Medical History  Hypertension, history of stroke, history of hyperlipidemia.    Interim history/subjective:  No issues over night   Objective   Blood pressure (Abnormal) 150/87, pulse 89, temperature 98.3 F (36.8 C), temperature source Oral, resp. rate (Abnormal) 27, height 5' (1.524 m), weight 56.7 kg, SpO2 98 %.    FiO2 (%):  [28 %] 28 %   Intake/Output Summary (Last 24 hours) at 01/30/2019 0936 Last data filed at 01/30/2019 0600 Gross per 24 hour  Intake 915.37 ml  Output 1450 ml  Net -534.63 ml   Filed Weights   01/27/19 0500 01/28/19 0500 01/29/19 0500  Weight: 56.2 kg 54.5 kg 56.7 kg   Examination: Physical Exam  Constitutional:      General: She is not in acute distress.    Appearance: She is not ill-appearing or toxic-appearing.  HENT:     Head: Normocephalic and atraumatic.     Nose: Nose normal.     Mouth/Throat:     Mouth: Mucous membranes are moist.  Eyes:     Pupils: Pupils are equal, round, and reactive to light.  Neck:     Musculoskeletal: Normal range of motion.     Comments: The tracheostomy tube is unremarkable Cardiovascular:     Rate and Rhythm: Normal rate.     Heart sounds: No murmur. No friction rub. No gallop.   Pulmonary:     Effort: Pulmonary effort is normal.     Breath sounds: Rhonchi present. No rales.  Abdominal:     General: Abdomen is flat. Bowel sounds are normal.     Palpations: Abdomen is soft.  Skin:    General: Skin is warm and dry.     Capillary Refill: Capillary refill takes less than 2 seconds.  Neurological:     Mental Status: She is alert.     Comments: Eyes open, moves extremities.  Does not follow commands      Assessment & Plan:   Respiratory failure, required mechanical ventilation Tracheostomy dependence  possible aspiration event 3/30 Consistent with normal flora  plan Continue trach collar as tolerated  Vent on standby for 24 more hours  Day #4 of 5 Zosyn   History of prolonged encephalopathy, secondary to super refractory status epilepticus Plan Anticonvulsants Needs LTAC  Severe deconditioning Plan Needs LTAC  Dysphasia Plan Tube  feeds Best practice:  Diet: On tube feeds VAP protocol (if indicated): bundle in place. DVT prophylaxis: Lovenox GI prophylaxis: Pantoprazole Urinary catheter: required for skin breakdown Glucose control: Phase 1.  Code Status: Full Disposition: Continue trach collar as tolerated.  Continue rehabilitation efforts.  Her mental status will keep her tracheostomy dependent.  Would complete 5 days of antibiotics, keep ventilator at bedside for 24 more hours but if no issues can discontinue.  We  will see twice weekly from here on out but be available as needed  Simonne Martinet ACNP-BC [facility] Pulmonary/Critical Care Pager # [phone] OR # [phone] if no answer

## 2019-01-30 NOTE — TOC Progression Note (Addendum)
Transition of Care [facility]) - Progression Note    Patient Details  Name: Erika Wang MRN: [id number] Date of Birth: 18-Feb-1951  Transition of Care [facility]) CM/SW Contact  Doy Hutching, [place] Phone Number: 01/30/2019, 11:19 AM  Clinical Narrative:    Acknowledging consult, pt appeal letter was sent by [employer] to [facility].  If pt continues to be denied for LTAC will need SNF. Pt currently with coretrak and per consult/SNF guidelines will need PEG. CSW feels PMT/Hospitalist GOC conversation would be helpful given pt's current care needs. Pt also will need to tolerate cuffless trach for SNF placement.   CSW will continue to follow, please sign fl2 for PASRR.   Expected Discharge Plan: Skilled Nursing Facility    Expected Discharge Plan and Services Expected Discharge Plan: Skilled Nursing Facility     Post Acute Care Choice: Skilled Nursing Facility                    Social Determinants of Health (SDOH) Interventions    Readmission Risk Interventions No flowsheet data found.

## 2019-01-30 NOTE — Progress Notes (Signed)
PROGRESS NOTE    Erika Wang  OEU:[id number] DOB: [birth year] DOA: 01/02/2019 PCP: Patient, No Pcp Per   Brief Narrative:  67yo BF PMHx CVA,with residual mild left-sided weakness, HTN, HLD,   Brought to the ED earlier today-after she was found by family on the floor.  She was last seen normal approximately 2 days back by her daughter-Per daughter-she was apparently slightly off balance but wise was in her usual state of health.  The daughter does not remember patient complaining about fever, headache, nausea, vomiting, abdominal pain or diarrhea.  Patient was not answering the daughter's phone calls, and apparently was also not come to the when the physical therapist went to see her-the daughter went to the patient's apartment manager and had the door open-patient was found lying on the floor covered with cat litter-with right gaze and unresponsive.  Patient was brought to the emergency room, and was found to have left-sided weakness, patient remained unresponsive-although had stable vital signs.  Neurology consultation was called-EEG was obtained-initial CT scans including of the head did not show any acute abnormalities.    EEG positive status epilepticus. She required high dose sedative infusions to gain control, which has lead to prolonged encephalopathy.      Subjective: 4/2 patient unresponsive except for withdrawal to painful stimuli around neck and face, mild withdrawal left hand    Assessment & Plan:   Principal Problem:   Encephalopathy acute Active Problems:   Left hemiparesis ([place])   Encounter for intubation   Aspiration pneumonia ([place])   Acute respiratory failure with hypoxia ([place])   Status epilepticus ([place])   Community acquired pneumonia of right upper lobe of lung ([place])   Pressure injury of skin   Acute hypoxic respiratory failure - Require prolonged vent>> trach - Patient returned to vent support on 3/30 after aspiration episode - Currently on trach collar 28%  FiO2  Hypoxic ischemic encephalopathy - Supportive care - Expect you to require significant time for improvement if at all. - 4/2 awaiting palliative care discussion with family concerning CODE STATUS change to DNR  vs hospice   Aspiration pneumonia recurrent 3/30 - Completed 5-day course of Unasyn with initial event - Complete 5-day course of Zosyn   Super refractory status epilepticus -Due to his prior CVA  - Vimpat 200 mg BID - Keppra 1500 mg BID -Ativan as needed - Phenytoin 75 mg TID - Topamax 175 mg BID -Continue care per neurology  Nutrition - Core track x2 days -Continue tube feeds through core track - Social work consult: Awaiting from social work/palliative care on family's wish to place PEG tube.  Patient will not be a SNF placement candidate until tube placed.    Mild Hypokalemia  -Potassium goal> 4   Hypomagnesemia - Magnesium goal> 2   Normocytic anemia  -Most likely secondary to critical illness, anemia panel consistent with this -Follow closely  Remote CVA -Right gaze preference and left hemiparesis reported, although patient unresponsive except to painful stimuli around face and neck.      DVT prophylaxis: Lovenox  code Status: Full Family Communication: None Disposition Plan: TBD   Consultants:  Neurology  Procedures/Significant Events:     I have personally reviewed and interpreted all radiology studies and my findings are as above.  VENTILATOR SETTINGS: Trach collar FiO2 28% Flow rate 5L/min   Cultures 3/5 blood positive STAPHYLOCOCCUS EPIDERMIDIS 2/2 cultures 3/6 influenza A/B negative 3/6 blood negative 3/6 tracheal aspirate consistent with normal flora 3/30 tracheal aspirate consistent normal flora  Antimicrobials: Anti-infectives (From admission, onward)   Start     Stop   01/28/19 0000  piperacillin-tazobactam (ZOSYN) IVPB 3.375 g         01/27/19 1800  piperacillin-tazobactam (ZOSYN) IVPB 3.375 g     01/27/19 1844    01/06/19 1800  Ampicillin-Sulbactam (UNASYN) 3 g in sodium chloride 0.9 % 100 mL IVPB     01/08/19 0425   01/03/19 1400  ceFAZolin (ANCEF) IVPB 2g/100 mL premix  Status:  Discontinued     01/03/19 0951   01/03/19 1000  Ampicillin-Sulbactam (UNASYN) 3 g in sodium chloride 0.9 % 100 mL IVPB  Status:  Discontinued     01/06/19 1033   01/02/19 1715  Ampicillin-Sulbactam (UNASYN) 3 g in sodium chloride 0.9 % 100 mL IVPB  Status:  Discontinued     01/03/19 0946   01/02/19 1545  metroNIDAZOLE (FLAGYL) IVPB 500 mg     01/03/19 0000   01/02/19 1515  cefTRIAXone (ROCEPHIN) 1 g in sodium chloride 0.9 % 100 mL IVPB     01/02/19 1722   01/02/19 1515  azithromycin (ZITHROMAX) 500 mg in sodium chloride 0.9 % 250 mL IVPB  Status:  Discontinued     01/02/19 1534       Devices 6 mm cuffed trach >>>   LINES / TUBES:  PIC double-lumen>> Rectal tube 3/31>> NG tube 3/30>>>    Continuous Infusions: . sodium chloride 250 mL (01/29/19 0751)  . feeding supplement (JEVITY 1.5 CAL/FIBER) 1,000 mL (01/29/19 1530)  . piperacillin-tazobactam (ZOSYN)  IV 3.375 g (01/30/19 0032)     Objective: Vitals:   01/30/19 0002 01/30/19 0403 01/30/19 0749 01/30/19 0805  BP:   (!) 145/75 (!) 150/87  Pulse: 89 86 82 89  Resp: (!) 21 18 (!) 27 (!) 27  Temp:   98.3 F (36.8 C)   TempSrc:   Oral   SpO2: 98% 99% 99% 98%  Weight:      Height:        Intake/Output Summary (Last 24 hours) at 01/30/2019 0850 Last data filed at 01/30/2019 0600 Gross per 24 hour  Intake 915.37 ml  Output 1450 ml  Net -534.63 ml   Filed Weights   01/27/19 0500 01/28/19 0500 01/29/19 0500  Weight: 56.2 kg 54.5 kg 56.7 kg   General: No acute respiratory distress, cachectic Eyes: negative scleral hemorrhage, negative anisocoria, negative icterus ENT: Negative Runny nose, negative gingival bleeding, Neck:  Negative scars, masses, torticollis, lymphadenopathy, JVD Lungs: Clear to auscultation bilaterally without wheezes or  crackles Cardiovascular: Regular rate and rhythm without murmur gallop or rub normal S1 and S2 Abdomen: negative abdominal pain, nondistended, positive soft, bowel sounds, no rebound, no ascites, no appreciable mass Extremities: No significant cyanosis, clubbing, or edema bilateral lower extremities Skin: Negative rashes, lesions, ulcers Psychiatric: Unable to evaluate secondary to patient's altered mental status.  Central nervous system: Patient withdraws to painful stimuli around neck and face: Slight withdraw to painful stimuli left hand.  Unable to evaluate further secondary to altered mental status. .    Data Reviewed: Care during the described time interval was provided by me .  I have reviewed this patient's available data, including medical history, events of note, physical examination, and all test results as part of my evaluation.   CBC: Recent Labs  Lab 01/25/19 0555 01/25/19 1155 01/26/19 0422 01/27/19 0516 01/29/19 0500  WBC 6.1 5.7 5.3 4.6 6.6  NEUTROABS 4.1  --   --   --   --  HGB 10.3* 9.5* 10.5* 9.6* 9.7*  HCT 32.3* 29.5* 33.1* 30.7* 30.7*  MCV 93.6 92.5 94.0 93.6 94.2  PLT 328 376 334 305 841   Basic Metabolic Panel: Recent Labs  Lab 01/25/19 0555 01/25/19 1155 01/26/19 0422 01/27/19 0516 01/28/19 0510 01/29/19 0500  NA 136 133* 134* 135 133* 138  K 3.5 3.3* 3.8 3.3* 4.3 3.5  CL 104 108 100 103 102 103  CO2 23 20* 22 21* 21* 23  GLUCOSE 126* 138* 114* 157* 115* 125*  BUN 19 20 24* 28* 28* 17  CREATININE 0.56 0.58 0.57 0.71 0.95 0.90  CALCIUM 8.7* 7.9* 9.1 8.1* 9.0 9.3  MG 1.7  --  1.7  --  2.6*  --   PHOS 2.8  --   --   --   --   --    GFR: Estimated Creatinine Clearance: 48.5 mL/min (by C-G formula based on SCr of 0.9 mg/dL). Liver Function Tests: Recent Labs  Lab 01/26/19 0422 01/27/19 0516 01/29/19 0500  AST 49* 47* 56*  ALT 83* 80* 88*  ALKPHOS 99 89 98  BILITOT 0.3 0.3 0.3  PROT 6.7 6.0* 6.7  ALBUMIN 2.0* 1.9* 1.9*   No results for  input(s): LIPASE, AMYLASE in the last 168 hours. Recent Labs  Lab 01/26/19 0422  AMMONIA 34   Coagulation Profile: Recent Labs  Lab 01/27/19 0516  INR 1.2   Cardiac Enzymes: No results for input(s): CKTOTAL, CKMB, CKMBINDEX, TROPONINI in the last 168 hours. BNP (last 3 results) No results for input(s): PROBNP in the last 8760 hours. HbA1C: No results for input(s): HGBA1C in the last 72 hours. CBG: Recent Labs  Lab 01/29/19 1604 01/29/19 2015 01/30/19 0028 01/30/19 0501 01/30/19 0749  GLUCAP 138* 147* 83 133* 114*   Lipid Profile: No results for input(s): CHOL, HDL, LDLCALC, TRIG, CHOLHDL, LDLDIRECT in the last 72 hours. Thyroid Function Tests: No results for input(s): TSH, T4TOTAL, FREET4, T3FREE, THYROIDAB in the last 72 hours. Anemia Panel: No results for input(s): VITAMINB12, FOLATE, FERRITIN, TIBC, IRON, RETICCTPCT in the last 72 hours. Urine analysis:    Component Value Date/Time   COLORURINE YELLOW 01/02/2019 1127   APPEARANCEUR CLEAR 01/02/2019 1127   LABSPEC 1.021 01/02/2019 1127   PHURINE 5.0 01/02/2019 1127   GLUCOSEU NEGATIVE 01/02/2019 1127   HGBUR SMALL (A) 01/02/2019 1127   BILIRUBINUR NEGATIVE 01/02/2019 1127   KETONESUR 5 (A) 01/02/2019 1127   PROTEINUR 100 (A) 01/02/2019 1127   UROBILINOGEN 1.0 10/01/2013 0227   NITRITE NEGATIVE 01/02/2019 1127   LEUKOCYTESUR NEGATIVE 01/02/2019 1127   Sepsis Labs: '@LABRCNTIP'$ (procalcitonin:4,lacticidven:4)  ) Recent Results (from the past 240 hour(s))  Culture, respiratory (non-expectorated)     Status: None   Collection Time: 01/27/19  4:14 PM  Result Value Ref Range Status   Specimen Description TRACHEAL ASPIRATE  Final   Special Requests NONE  Final   Gram Stain   Final    ABUNDANT WBC PRESENT,BOTH PMN AND MONONUCLEAR FEW GRAM VARIABLE ROD RARE GRAM POSITIVE COCCI    Culture   Final    Consistent with normal respiratory flora. Performed at [facility], [place] [address] [place], [place]  [address]    Report Status 01/29/2019 FINAL  Final         Radiology Studies: No results found.      Scheduled Meds: . amantadine  100 mg Per Tube Daily  . chlorhexidine  15 mL Mouth Rinse BID  . chlorhexidine gluconate (MEDLINE KIT)  15  mL Mouth Rinse BID  . diltiazem  60 mg Per Tube Q6H  . enoxaparin (LOVENOX) injection  40 mg Subcutaneous Q24H  . feeding supplement (PRO-STAT SUGAR FREE 64)  30 mL Per Tube Daily  . free water  200 mL Per Tube Q6H  . Gerhardt's butt cream   Topical BID  . insulin aspart  0-15 Units Subcutaneous Q4H  . lacosamide  200 mg Per Tube BID  . levETIRAcetam  1,500 mg Per Tube BID  . mouth rinse  15 mL Mouth Rinse 10 times per day  . multivitamin  15 mL Per Tube Daily  . pantoprazole sodium  40 mg Per Tube Daily  . phenytoin  75 mg Per Tube Q8H  . [START ON 02/05/2019] topiramate  100 mg Per Tube BID  . topiramate  75 mg Per Tube BID   Continuous Infusions: . sodium chloride 250 mL (01/29/19 0751)  . feeding supplement (JEVITY 1.5 CAL/FIBER) 1,000 mL (01/29/19 1530)  . piperacillin-tazobactam (ZOSYN)  IV 3.375 g (01/30/19 0032)     LOS: 28 days    Time spent: 16 minutes    Taliya Mcclard, Geraldo Docker, MD [facility] Pager [phone]   If 7PM-7AM, please contact night-coverage [email or web address] Password [email or web address] 01/30/2019, 8:50 AM

## 2019-01-30 NOTE — Progress Notes (Signed)
Noted after turning patient from back to right side. Left leg twitching approximate 15seconds. At that time twitch no longer noted. Vitals stable, NSR on tele. Will continue to monitor patient.  Erika Wang

## 2019-01-30 NOTE — NC FL2 (Signed)
[place] LEVEL OF CARE SCREENING TOOL     IDENTIFICATION  Patient Name: Jazzie Trampe Birthdate: September 30, 1951 Sex: female Admission Date (Current Location): 01/02/2019  [place] and [place] Number:  Kathleen Argue [id number] [place] and Address:  [facility] [place]. [facility], [place] [address] [place], [place] [address]      Provider Number: [id number]  Attending Physician Name and Address:  Allie Bossier, MD  Relative Name and Phone Number:  Hurman Horn [phone]    Current Level of Care: Hospital Recommended Level of Care: Register Prior Approval Number:    Date Approved/Denied:   PASRR Number: pending  Discharge Plan: SNF    Current Diagnoses: Patient Active Problem List   Diagnosis Date Noted  . Pressure injury of skin 01/19/2019  . Acute respiratory failure with hypoxia ([place])   . Status epilepticus ([place])   . Community acquired pneumonia of right upper lobe of lung ([place])   . Encephalopathy acute 01/02/2019  . Aspiration pneumonia ([place]) 01/02/2019  . Acute renal failure ([place]) 10/07/2013  . CVA (cerebral infarction) 10/03/2013  . Infection of urinary tract 10/02/2013  . Cigar smoker motivated to quit 10/02/2013  . 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 Left hemiparesis ([place]) 10/02/2013  . Encounter for intubation 10/02/2013  . Stroke ([place]) 09/28/2013    Orientation RESPIRATION BLADDER Height & Weight     (disoriented x4)  Tracheostomy(48m shilley 5L 28%) Incontinent, Indwelling catheter Weight: 125 lb (56.7 kg) Height:  5' (152.4 cm)  BEHAVIORAL SYMPTOMS/MOOD NEUROLOGICAL BOWEL NUTRITION STATUS      Incontinent Diet(see discharge summary)  AMBULATORY STATUS COMMUNICATION OF NEEDS Skin   Total Care Verbally PU Stage and Appropriate Care, Other (Comment)(stage 3 on buttocks; stage 2 on vagina; MASD on back, perineum, and legs with barrier cream)                       Personal Care Assistance Level of Assistance   Bathing, Feeding, Dressing, Total care Bathing Assistance: Maximum assistance Feeding assistance: Maximum assistance Dressing Assistance: Maximum assistance Total Care Assistance: Maximum assistance   Functional Limitations Info  Sight, Hearing, Speech Sight Info: Impaired Hearing Info: Impaired Speech Info: Impaired    SPECIAL CARE FACTORS FREQUENCY  PT (By licensed PT), OT (By licensed OT)     PT Frequency: 5x week OT Frequency: 5x week            Contractures Contractures Info: Not present    Additional Factors Info  Code Status, Allergies, Psychotropic, Insulin Sliding Scale Code Status Info: Full Code Allergies Info: ACE INHIBITORS  Psychotropic Info:  lacosamide (VIMPAT) tablet 200 mg 2x day through tube; levETIRAcetam (KEPPRA) 100 MG/ML solution 1,500 mg 2x daily through tube  Insulin Sliding Scale Info: insulin aspart (novoLOG) injection 0-15 Units every 4 hours;       Current Medications (01/30/2019):  This is the current hospital active medication list Current Facility-Administered Medications  Medication Dose Route Frequency Provider Last Rate Last Dose  . 0.9 %  sodium chloride infusion  250 mL Intravenous Continuous Minor, WGrace Bushy NP 10 mL/hr at 01/29/19 0751 250 mL at 01/29/19 0751  . acetaminophen (TYLENOL) solution 650 mg  650 mg Per Tube Q6H PRN MCherene Altes MD   650 mg at 01/27/19 1813  . albuterol (PROVENTIL) (2.5 MG/3ML) 0.083% nebulizer solution 2.5 mg  2.5 mg Nebulization Q2H PRN Minor, WGrace Bushy NP      . amantadine (SYMMETREL) 50 MG/5ML solution  100 mg  100 mg Per Tube Daily Minor, Grace Bushy, NP   100 mg at 01/30/19 1019  . chlorhexidine (PERIDEX) 0.12 % solution 15 mL  15 mL Mouth Rinse BID Cherene Altes, MD   15 mL at 01/29/19 2203  . chlorhexidine gluconate (MEDLINE KIT) (PERIDEX) 0.12 % solution 15 mL  15 mL Mouth Rinse BID Cherene Altes, MD   15 mL at 01/30/19 1018  . diltiazem (CARDIZEM) 10 mg/ml oral suspension 60 mg  60 mg  Per Tube Q6H Minor, Grace Bushy, NP   60 mg at 01/30/19 0540  . docusate (COLACE) 50 MG/5ML liquid 100 mg  100 mg Per Tube BID PRN Minor, Grace Bushy, NP   100 mg at 01/14/19 0921  . enoxaparin (LOVENOX) injection 40 mg  40 mg Subcutaneous Q24H Minor, Grace Bushy, NP   40 mg at 01/29/19 2034  . feeding supplement (JEVITY 1.5 CAL/FIBER) liquid 1,000 mL  1,000 mL Per Tube Continuous Cherene Altes, MD 45 mL/hr at 01/29/19 1530 1,000 mL at 01/29/19 1530  . feeding supplement (PRO-STAT SUGAR FREE 64) liquid 30 mL  30 mL Per Tube Daily Cherene Altes, MD   30 mL at 01/30/19 1018  . free water 200 mL  200 mL Per Tube Q6H Cherene Altes, MD   200 mL at 01/30/19 0700  . Gerhardt's butt cream   Topical BID Joette Catching T, MD      . hydrALAZINE (APRESOLINE) injection 10 mg  10 mg Intravenous Q6H PRN Minor, Grace Bushy, NP   10 mg at 01/07/19 2109  . insulin aspart (novoLOG) injection 0-15 Units  0-15 Units Subcutaneous Q4H Minor, Grace Bushy, NP   2 Units at 01/30/19 0503  . lacosamide (VIMPAT) tablet 200 mg  200 mg Per Tube BID Alvira Philips, RPH   200 mg at 01/30/19 1018  . levETIRAcetam (KEPPRA) 100 MG/ML solution 1,500 mg  1,500 mg Per Tube BID Alvira Philips, RPH   1,500 mg at 01/30/19 1019  . lidocaine (XYLOCAINE) 1 % (with pres) injection   Infiltration PRN Sandi Mariscal, MD   10 mL at 01/08/19 1737  . LORazepam (ATIVAN) injection 1 mg  1 mg Intravenous Q6H PRN Minor, Grace Bushy, NP   1 mg at 01/20/19 1020  . MEDLINE mouth rinse  15 mL Mouth Rinse 10 times per day Joette Catching T, MD   15 mL at 01/30/19 1020  . multivitamin liquid 15 mL  15 mL Per Tube Daily Minor, Grace Bushy, NP   15 mL at 01/30/19 1019  . ondansetron (ZOFRAN) injection 4 mg  4 mg Intravenous Q6H PRN Minor, Grace Bushy, NP      . pantoprazole sodium (PROTONIX) 40 mg/20 mL oral suspension 40 mg  40 mg Per Tube Daily Cherene Altes, MD   40 mg at 01/29/19 2157  . phenytoin (DILANTIN) 125 MG/5ML suspension 75 mg  75 mg  Per Tube [address], RPH   75 mg at 01/30/19 0540  . piperacillin-tazobactam (ZOSYN) IVPB 3.375 g  3.375 g Intravenous Q8H Joette Catching T, MD 12.5 mL/hr at 01/30/19 1016 3.375 g at 01/30/19 1016  . senna (SENOKOT) tablet 8.6 mg  1 tablet Per Tube Daily PRN Cherene Altes, MD      . Derrill Memo ON 02/05/2019] topiramate (TOPAMAX) tablet 100 mg  100 mg Per Tube BID Joette Catching T, MD      . topiramate (TOPAMAX) tablet 75 mg  75 mg Per Tube BID Cherene Altes, MD   75 mg at 01/30/19 1018     Discharge Medications: Please see discharge summary for a list of discharge medications.  Relevant Imaging Results:  Relevant Lab Results:   Additional Information [id number] [place], [place]

## 2019-01-30 NOTE — Plan of Care (Signed)
  Problem: Education: Goal: Knowledge of General Education information will improve Description Including pain rating scale, medication(s)/side effects and non-pharmacologic comfort measures Outcome: Not Progressing   Problem: Health Behavior/Discharge Planning: Goal: Ability to manage health-related needs will improve Outcome: Not Progressing   Problem: Clinical Measurements: Goal: Ability to maintain clinical measurements within normal limits will improve Outcome: Not Progressing Goal: Will remain free from infection Outcome: Not Progressing Goal: Diagnostic test results will improve Outcome: Not Progressing Goal: Respiratory complications will improve Outcome: Not Progressing Goal: Cardiovascular complication will be avoided Outcome: Not Progressing   Problem: Activity: Goal: Risk for activity intolerance will decrease Outcome: Not Progressing   Problem: Nutrition: Goal: Adequate nutrition will be maintained Outcome: Not Progressing   Problem: Coping: Goal: Level of anxiety will decrease Outcome: Not Progressing   Problem: Elimination: Goal: Will not experience complications related to bowel motility Outcome: Not Progressing Goal: Will not experience complications related to urinary retention Outcome: Not Progressing   Problem: Pain Managment: Goal: General experience of comfort will improve Outcome: Not Progressing   Problem: Safety: Goal: Ability to remain free from injury will improve Outcome: Not Progressing   Problem: Skin Integrity: Goal: Risk for impaired skin integrity will decrease Outcome: Not Progressing   Problem: Activity: Goal: Ability to tolerate increased activity will improve Outcome: Not Progressing   Problem: Respiratory: Goal: Ability to maintain a clear airway and adequate ventilation will improve Outcome: Not Progressing   Problem: Role Relationship: Goal: Method of communication will improve Outcome: Not Progressing   Problem:  Education: Goal: Expressions of having a comfortable level of knowledge regarding the disease process will increase Outcome: Not Progressing   Problem: Coping: Goal: Ability to adjust to condition or change in health will improve Outcome: Not Progressing Goal: Ability to identify appropriate support needs will improve Outcome: Not Progressing   Problem: Health Behavior/Discharge Planning: Goal: Compliance with prescribed medication regimen will improve Outcome: Not Progressing   Problem: Medication: Goal: Risk for medication side effects will decrease Outcome: Not Progressing   Problem: Clinical Measurements: Goal: Complications related to the disease process, condition or treatment will be avoided or minimized Outcome: Not Progressing Goal: Diagnostic test results will improve Outcome: Not Progressing   Problem: Safety: Goal: Verbalization of understanding the information provided will improve Outcome: Not Progressing   Problem: Self-Concept: Goal: Level of anxiety will decrease Outcome: Not Progressing Goal: Ability to verbalize feelings about condition will improve Outcome: Not Progressing

## 2019-01-31 ENCOUNTER — Other Ambulatory Visit (HOSPITAL_COMMUNITY): Payer: Medicare HMO

## 2019-01-31 ENCOUNTER — Inpatient Hospital Stay
Admit: 2019-01-31 | Discharge: 2019-03-25 | Disposition: A | Discharge status: Skilled Nursing Facility | Payer: Medicare HMO | Attending: Internal Medicine | Admitting: Internal Medicine

## 2019-01-31 DIAGNOSIS — Z930 Tracheostomy status: Secondary | ICD-10-CM

## 2019-01-31 DIAGNOSIS — Z931 Gastrostomy status: Secondary | ICD-10-CM

## 2019-01-31 DIAGNOSIS — R140 Abdominal distension (gaseous): Secondary | ICD-10-CM

## 2019-01-31 DIAGNOSIS — G931 Anoxic brain damage, not elsewhere classified: Secondary | ICD-10-CM | POA: Diagnosis present

## 2019-01-31 DIAGNOSIS — Z431 Encounter for attention to gastrostomy: Secondary | ICD-10-CM

## 2019-01-31 DIAGNOSIS — J9690 Respiratory failure, unspecified, unspecified whether with hypoxia or hypercapnia: Secondary | ICD-10-CM

## 2019-01-31 DIAGNOSIS — K567 Ileus, unspecified: Secondary | ICD-10-CM

## 2019-01-31 DIAGNOSIS — J690 Pneumonitis due to inhalation of food and vomit: Secondary | ICD-10-CM | POA: Diagnosis present

## 2019-01-31 DIAGNOSIS — Z4659 Encounter for fitting and adjustment of other gastrointestinal appliance and device: Secondary | ICD-10-CM

## 2019-01-31 DIAGNOSIS — K9423 Gastrostomy malfunction: Secondary | ICD-10-CM

## 2019-01-31 DIAGNOSIS — G40911 Epilepsy, unspecified, intractable, with status epilepticus: Secondary | ICD-10-CM | POA: Diagnosis present

## 2019-01-31 DIAGNOSIS — R112 Nausea with vomiting, unspecified: Secondary | ICD-10-CM

## 2019-01-31 DIAGNOSIS — J189 Pneumonia, unspecified organism: Secondary | ICD-10-CM

## 2019-01-31 DIAGNOSIS — T17908A Unspecified foreign body in respiratory tract, part unspecified causing other injury, initial encounter: Secondary | ICD-10-CM

## 2019-01-31 DIAGNOSIS — G40301 Generalized idiopathic epilepsy and epileptic syndromes, not intractable, with status epilepticus: Secondary | ICD-10-CM

## 2019-01-31 DIAGNOSIS — Z515 Encounter for palliative care: Secondary | ICD-10-CM

## 2019-01-31 DIAGNOSIS — J9621 Acute and chronic respiratory failure with hypoxia: Secondary | ICD-10-CM | POA: Diagnosis present

## 2019-01-31 DIAGNOSIS — Z7189 Other specified counseling: Secondary | ICD-10-CM

## 2019-01-31 HISTORY — DX: Generalized idiopathic epilepsy and epileptic syndromes, not intractable, with status epilepticus: G40.301

## 2019-01-31 HISTORY — DX: Anoxic brain damage, not elsewhere classified: G93.1

## 2019-01-31 HISTORY — DX: Acute and chronic respiratory failure with hypoxia: J96.21

## 2019-01-31 HISTORY — DX: Epilepsy, unspecified, intractable, with status epilepticus: G40.911

## 2019-01-31 HISTORY — DX: Pneumonitis due to inhalation of food and vomit: J69.0

## 2019-01-31 LAB — GLUCOSE, CAPILLARY
Glucose-Capillary: 145 mg/dL — ABNORMAL HIGH (ref 70–99)
Glucose-Capillary: 132 mg/dL — ABNORMAL HIGH (ref 70–99)
Glucose-Capillary: 120 mg/dL — ABNORMAL HIGH (ref 70–99)
Glucose-Capillary: 108 mg/dL — ABNORMAL HIGH (ref 70–99)
Glucose-Capillary: 118 mg/dL — ABNORMAL HIGH (ref 70–99)

## 2019-01-31 LAB — BASIC METABOLIC PANEL
Anion gap: 12 (ref 5–15)
BUN: 16 mg/dL (ref 8–23)
CO2: 23 mmol/L (ref 22–32)
Calcium: 9.6 mg/dL (ref 8.9–10.3)
Chloride: 103 mmol/L (ref 98–111)
Creatinine, Ser: 0.71 mg/dL (ref 0.44–1.00)
GFR calc Af Amer: >60 mL/min (ref >60)
GFR calc non Af Amer: >60 mL/min (ref >60)
Glucose, Bld: 131 mg/dL — ABNORMAL HIGH (ref 70–99)
Potassium: 4.1 mmol/L (ref 3.5–5.1)
Sodium: 138 mmol/L (ref 135–145)

## 2019-01-31 LAB — ALBUMIN: Albumin: 2.2 g/dL — ABNORMAL LOW (ref 3.5–5.0)

## 2019-01-31 LAB — PHENYTOIN LEVEL, TOTAL: Phenytoin Lvl: 11.1 ug/mL (ref 10.0–20.0)

## 2019-01-31 MED ORDER — LORAZEPAM 2 MG/ML IJ SOLN: 1.0000 mg | Freq: Four times a day (QID) | INTRAMUSCULAR | 0 refills | Status: AC | PRN

## 2019-01-31 MED ORDER — ORAL CARE MOUTH RINSE: 15.0000 mL | Freq: Every day | OROMUCOSAL | 0 refills | Status: AC

## 2019-01-31 MED ORDER — AMANTADINE HCL 50 MG/5ML PO SYRP: 100.0000 mg | ORAL_SOLUTION | Freq: Every day | ORAL | 0 refills | Status: AC

## 2019-01-31 MED ORDER — JEVITY 1.2 CAL PO LIQD: ORAL | 0 refills | Status: AC

## 2019-01-31 MED ORDER — PHENYTOIN 125 MG/5ML PO SUSP
50.0000 mg | Freq: Every day | ORAL | Status: DC
  Administered 2019-01-31: 16:00:00 50 mg
  Filled 2019-01-31: qty 4

## 2019-01-31 MED ORDER — ASPIRIN EC 81 MG PO TBEC: 81.0000 mg | DELAYED_RELEASE_TABLET | Freq: Every day | ORAL | 0 refills | Status: AC

## 2019-01-31 MED ORDER — PHENYTOIN 125 MG/5ML PO SUSP
75.0000 mg | Freq: Two times a day (BID) | ORAL | Status: DC
  Filled 2019-01-31 (×2): qty 4

## 2019-01-31 MED ORDER — PANTOPRAZOLE SODIUM 40 MG PO PACK: 40.0000 mg | PACK | Freq: Every day | ORAL | 0 refills | Status: AC

## 2019-01-31 MED ORDER — LEVETIRACETAM 100 MG/ML PO SOLN: 1500.0000 mg | Freq: Two times a day (BID) | ORAL | 12 refills | Status: AC

## 2019-01-31 MED ORDER — DOCUSATE SODIUM 50 MG/5ML PO LIQD: 100.0000 mg | Freq: Two times a day (BID) | ORAL | 0 refills | Status: AC

## 2019-01-31 MED ORDER — JEVITY 1.5 CAL/FIBER PO LIQD
1000.0000 mL | ORAL | Status: DC
  Filled 2019-01-31: qty 1000

## 2019-01-31 MED ORDER — PRO-STAT SUGAR FREE PO LIQD: 30.0000 mL | Freq: Two times a day (BID) | ORAL | Status: DC

## 2019-01-31 MED ORDER — TOPIRAMATE 25 MG PO TABS: 75.0000 mg | ORAL_TABLET | Freq: Two times a day (BID) | ORAL | 0 refills | Status: AC

## 2019-01-31 MED ORDER — PRO-STAT SUGAR FREE PO LIQD: 30.0000 mL | Freq: Two times a day (BID) | ORAL | 0 refills | Status: AC

## 2019-01-31 MED ORDER — CHLORHEXIDINE GLUCONATE CLOTH 2 % EX PADS
6.0000 | MEDICATED_PAD | Freq: Every day | CUTANEOUS | Status: DC
  Administered 2019-01-31: 6 via TOPICAL

## 2019-01-31 MED ORDER — ENOXAPARIN SODIUM 40 MG/0.4ML ~~LOC~~ SOLN: 40.0000 mg | SUBCUTANEOUS | 0 refills | Status: AC

## 2019-01-31 MED ORDER — ADULT MULTIVITAMIN LIQUID CH: 15.0000 mL | Freq: Every day | ORAL | 0 refills | Status: AC

## 2019-01-31 MED ORDER — PHENYTOIN 125 MG/5ML PO SUSP: 75.0000 mg | Freq: Two times a day (BID) | ORAL | 0 refills | Status: DC

## 2019-01-31 MED ORDER — PHENYTOIN 125 MG/5ML PO SUSP: ORAL | 12 refills | Status: DC

## 2019-01-31 MED ORDER — PHENYTOIN 125 MG/5ML PO SUSP: 50.0000 mg | Freq: Two times a day (BID) | ORAL | 12 refills | Status: DC

## 2019-01-31 MED ORDER — FREE WATER: 200.0000 mL | Freq: Four times a day (QID) | 0 refills | Status: AC

## 2019-01-31 MED ORDER — PHENYTOIN 125 MG/5ML PO SUSP: 75.0000 mg | Freq: Three times a day (TID) | ORAL | 12 refills | Status: AC

## 2019-01-31 MED ORDER — INSULIN ASPART 100 UNIT/ML ~~LOC~~ SOLN: SUBCUTANEOUS | 0 refills | Status: AC

## 2019-01-31 MED ORDER — TOPIRAMATE 100 MG PO TABS: 100.0000 mg | ORAL_TABLET | Freq: Two times a day (BID) | ORAL | 0 refills | Status: AC

## 2019-01-31 MED ORDER — GERHARDT'S BUTT CREAM: 1.0000 "application " | TOPICAL_CREAM | Freq: Two times a day (BID) | CUTANEOUS | 0 refills | Status: AC

## 2019-01-31 MED ORDER — HYDRALAZINE HCL 20 MG/ML IJ SOLN: 10.0000 mg | Freq: Four times a day (QID) | INTRAMUSCULAR | 0 refills | Status: AC | PRN

## 2019-01-31 MED ORDER — ACETAMINOPHEN 160 MG/5ML PO SOLN: 650.0000 mg | Freq: Four times a day (QID) | ORAL | 0 refills | Status: AC | PRN

## 2019-01-31 MED ORDER — LACOSAMIDE 10 MG/ML PO SOLN: 200.0000 mg | Freq: Two times a day (BID) | ORAL | 0 refills | Status: AC

## 2019-01-31 MED ORDER — DILTIAZEM 12 MG/ML ORAL SUSPENSION: 60.0000 mg | Freq: Four times a day (QID) | ORAL | 0 refills | Status: AC

## 2019-01-31 MED ORDER — ALBUTEROL SULFATE (2.5 MG/3ML) 0.083% IN NEBU: 2.5000 mg | INHALATION_SOLUTION | RESPIRATORY_TRACT | 12 refills | Status: AC | PRN

## 2019-01-31 NOTE — Progress Notes (Signed)
   Palliative medicine progress note  Patient seen, chart reviewed.  Staffed with bedside RN.  Patient opened her eyes to voice, light touch.  I was unable to get her to follow commands in terms of opening and closing her eyes.  I was able to reach her daughter Erika Wang at [phone].  Reviewed clinical course to date with Erika Wang.  I reviewed again CODE STATUS; defined full code and DNR.  Daughter reiterated that her mother is a full code.  We also talked about aspiration now in the setting of status atelectasis bacteremia with a history of CVA.  Plan  Confirmed that patient is full code Daughter wants to proceed with a PEG We will need likely LTAC level of care Did begin conversations with daughter Erika Wang, given that her mother was living independently prior to coming to the hospital with recent seizures, does she think that her mother could find quality of life with prolonged facility placement.  Ms. Maisie Fus states that she does not think that she would but still is in the mode of wanting to give her every opportunity to see if she can improve at all  Recommend community based palliative care for ongoing goals of care in facility.  Those services can be accessed through either care connection at [phone] or hospice and palliative care of [place]'s palliative medicine division, at [phone]  Patient likely would meet a prognosis of less than 6 months, thus qualifying for hospice support, but at this point that is not family's goal for patient.  Thank you, Erika Roux, NP Time spent: 25 minutes Greater than 50% of time was spent in counseling and coordination of care

## 2019-01-31 NOTE — Care Management (Signed)
Notified by [facility] admissions coordinator that denial has been overturned by insurance after appeal letter submitted.  Pt approved for admission to [facility] hospital, and bed available today.  Message sent to attending MD with update.  Notified pt's daughter, [name], with this news and she is pleased.    Await attending MD response.  Hopeful for dc to LTAC pending medical stability.   Quintella Baton, RN, BSN  Trauma/Neuro ICU Case Manager [phone]

## 2019-01-31 NOTE — Progress Notes (Signed)
NAMEGianelle Wang, MRN:  [id number], DOB:  1950/12/05, LOS: 29 ADMISSION DATE:  01/02/2019, CONSULTATION DATE:  01/02/2019 REFERRING MD:  TRH, CHIEF COMPLAINT:  Status epilepticus.   HPI/course in hospital  68 year old woman with AMS due to status epilepticus. Required high dose sedative infusions to gain control. Prolonged encephalopathy since then, slow to awake and follow commands.  Started on amantadine with limited improvement. Percutaneous tracheostomy placed 3/23.  Hospital course:  3/5 admit to floor, then ICU transfer.  3/6 intubated and on burst suppression per Neurology 3/8 versed stopped. Blood cultures cleared.  3/9 propofol stopped.  No further seizures 3/12 now off sedation X 72 hours no sig improvement  3/13: Withdrawing to pain, otherwise no significant changes 3/14: Following commands intermittently 3/15: Much more awake, following commands still.  Opening eyes to request.  Starting pressure support trials 3/18: Still sedated, Following commands. Poor effort on SBT 3/23: Trach placed 3/28: Picked up by internal medicine 3/30: Had been on aerosol trach collar, mental status still with limited exam.  Had episode of possible aspiration resulting in tachypnea, increased respiratory effort and ultimately being placed back on ventilator Past Medical History  Hypertension, history of stroke, history of hyperlipidemia.    Interim history/subjective:  Remained off mechanical ventilator overnight, she has been on ATC for the last 48 hours Discussed with respiratory therapy, no issues with secretions, suctioning  Objective   Blood pressure 133/80, pulse 80, temperature 98.6 F (37 C), temperature source Oral, resp. rate (!) 21, height 5' (1.524 m), weight 56.7 kg, SpO2 100 %.    FiO2 (%):  [28 %] 28 %   Intake/Output Summary (Last 24 hours) at 01/31/2019 0833 Last data filed at 01/31/2019 2353 Gross per 24 hour  Intake 604 ml  Output 2000 ml  Net -1396 ml   Filed  Weights   01/27/19 0500 01/28/19 0500 01/29/19 0500  Weight: 56.2 kg 54.5 kg 56.7 kg   Examination: Physical Exam Constitutional:      General: She is not in acute distress.    Appearance: She is not ill-appearing or toxic-appearing.  HENT:     Head: Normocephalic and atraumatic.     Nose: Nose normal.     Mouth/Throat:     Mouth: Mucous membranes are moist.  Eyes:     Pupils: Pupils are equal, round, and reactive to light.  Neck:     Musculoskeletal: Normal range of motion.     Comments: The tracheostomy tube is unremarkable Cardiovascular:     Rate and Rhythm: Normal rate.     Heart sounds: No murmur. No friction rub. No gallop.   Pulmonary:     Effort: Pulmonary effort is normal.     Breath sounds: Rhonchi present. No rales.  Abdominal:     General: Abdomen is flat. Bowel sounds are normal.     Palpations: Abdomen is soft.  Skin:    General: Skin is warm and dry.     Capillary Refill: Capillary refill takes less than 2 seconds.  Neurological:     Mental Status: She is alert.     Comments: Eyes open, moves extremities.  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  We will continue ad lib.  Remove mechanical ventilator from the room Today is day 5 of 5 Zosyn for possible aspiration pneumonia, place stop order  History of prolonged encephalopathy, secondary to super refractory status epilepticus Plan Anticonvulsants >> Dilantin, Vimpat, Keppra, Topamax Ativan available, has not required Will need LTAC care  Severe deconditioning Plan Will need LTAC care  Dysphasia Plan Tube feeds as ordered.  She will need a PEG assuming no changes in her goals for care.  Palliative care  is evaluating  Best practice:  Diet: On tube feeds VAP protocol (if indicated): bundle in place. DVT prophylaxis: Lovenox GI prophylaxis: Pantoprazole >> ??  Discontinue soon if she tolerates TF Urinary catheter: required for skin breakdown Glucose control: Phase 1.  Code Status: Full Disposition: Progressive status.  Continue ATC as tolerated and remove vent from room.  We will see twice weekly.  Agree with palliative care discussions regarding long-term disposition.  If PEG placed, aggressive care than she will require LTAC versus SNF  Levy Pupa, MD, PhD 01/31/2019, 8:42 AM [facility] [phone] or if no answer [phone]

## 2019-01-31 NOTE — Progress Notes (Addendum)
Labs collected per orders and tube color chart via smart set. Sent to lab via tube system with patient label on each blood collection tube. Erika Wang

## 2019-01-31 NOTE — TOC Transition Note (Signed)
Transition of Care [facility]) - CM/SW Discharge Note   Patient Details  Name: Erika Wang MRN: [id number] Date of Birth: 1951/09/25  [facility] CM/SW Contact:  Glennon Mac, RN Phone Number: 01/31/2019, 4:30 PM   Clinical Narrative:   Pt admitted on 01/02/19 after being found unresponsive by family.  She developed seizure-like activity, and was intubated for airway protection.  Pt s/p tracheostomy on 01/20/19.  Pt now stable for discharge to [facility] hospital for continued acute care.  Bed available today and insurance authorization received.    Final next level of care: Long Term Acute Care (LTAC) Barriers to Discharge: Barriers Resolved   Patient Goals and CMS Choice     Choice offered to / list presented to : Adult Children   Readmission Risk Interventions Readmission Risk Prevention Plan 01/31/2019  Transportation Screening Not Complete  Transportation Screening Comment Discharge to LTAC  PCP or Specialist Appt within 5-7 Days Not Complete  Not Complete comments Discharge to [facility] Screening Not Complete  Home Care Screening Not Completed Comments Discharge to LTAC  Medication Review (RN CM) Not Complete  Med Review comments Discharge to LTAC  Some recent data might be hidden   Quintella Baton, RN, BSN  Trauma/Neuro ICU Case Manager [phone]

## 2019-01-31 NOTE — Discharge Summary (Signed)
Physician Discharge Summary  Erika Wang SKA:[id number] DOB: 1950-11-05 DOA: 01/02/2019  PCP: Patient, No Pcp Per  Admit date: 01/02/2019 Discharge date: 01/31/2019  Time spent: 56mnutes  Recommendations for Outpatient Follow-up:  -Discharge to select LTAC.  Patient unlikely to have any useful recovery -Requires PEG tube placement   Discharge Diagnoses:  Principal Problem:   Encephalopathy acute Active Problems:   Left hemiparesis ([place]   Encounter for intubation   [place]iration pneumonia ([place]   Acute respiratory failure with hypoxia ([place]   Status epilepticus ([place]   Community acquired pneumonia of right upper lobe of lung ([facility]   Pressure injury of skin   Aspiration into airway   Goals of care, counseling/discussion   Palliative care by specialist   Discharge Condition: Guarded  Diet recommendation: Tube feeds  Filed Weights   01/27/19 0500 01/28/19 0500 01/29/19 0500  Weight: 56.2 kg 54.5 kg 56.7 kg    History of present illness:  67yo BF PMHx CVA,with residual mild left-sided weakness, HTN, HLD,    Brought to the ED earlier today-after she was found by family on the floor.  She was last seen normal approximately 2 days back by her daughter-Per daughter-she was apparently slightly off balance but wise was in her usual state of health.  The daughter does not remember patient complaining about fever, headache, nausea, vomiting, abdominal pain or diarrhea.  Patient was not answering the daughter's phone calls, and apparently was also not come to the when the physical therapist went to see her-the daughter went to the patient's apartment manager and had the door open-patient was found lying on the floor covered with cat litter-with right gaze and unresponsive.  Patient was brought to the emergency room, and was found to have left-sided weakness, patient remained unresponsive-although had stable vital signs.  Neurology consultation was called-EEG was obtained-initial CT scans  including of the head did not show any acute abnormalities.    EEG positive status epilepticus. She required high dose sedative infusions to gain control, which has lead to prolonged encephalopathy.     Hospital Course:  Acute hypoxic respiratory failure - Require prolonged vent>> trach - Patient returned to vent support on 3/30 after aspiration episode - Currently on trach collar 28% FiO2   Hypoxic ischemic encephalopathy - Supportive care - Expect you to require significant time for improvement if at all. - 4/2 awaiting palliative care discussion with family concerning CODE STATUS change to DNR  vs hospice    Aspiration pneumonia recurrent 3/30 - Completed 5-day course of Unasyn with initial event - Complete 5-day course of Zosyn   Super refractory status epilepticus -Due to his prior CVA  - Vimpat 200 mg BID - Keppra 1500 mg BID -Ativan as needed - Phenytoin 75 mg TID - Topamax 175 mg BID -Continue care per neurology   Nutrition - Core track x2 days -Continue tube feeds through core track - Social work consult: Awaiting from social work/palliative care on family's wish to place PEG tube.  Patient will not be a SNF placement candidate until tube placed.    Mild Hypokalemia  -Potassium goal> 4   Hypomagnesemia - Magnesium goal> 2   Normocytic anemia  -Most likely secondary to critical illness, anemia panel consistent with this -Follow closely   Remote CVA -Right gaze preference and left hemiparesis reported, although patient unresponsive except to painful stimuli around face and neck.     Consultations: Neurology   Ventilator settings Trach collar FiO2 28% Flow rate 5L/min   Cultures  3/5 blood positive STAPHYLOCOCCUS EPIDERMIDIS 2/2 cultures 3/6 influenza A/B negative 3/6 blood negative 3/6 tracheal aspirate consistent with normal flora 3/30 tracheal aspirate consistent normal flora   Antibiotics Anti-infectives (From admission, onward)   Start      Stop   01/28/19 0000  piperacillin-tazobactam (ZOSYN) IVPB 3.375 g     01/31/19 2359   01/27/19 1800  piperacillin-tazobactam (ZOSYN) IVPB 3.375 g     01/27/19 1844   01/06/19 1800  Ampicillin-Sulbactam (UNASYN) 3 g in sodium chloride 0.9 % 100 mL IVPB     01/08/19 0425   01/03/19 1400  ceFAZolin (ANCEF) IVPB 2g/100 mL premix  Status:  Discontinued     01/03/19 0951   01/03/19 1000  Ampicillin-Sulbactam (UNASYN) 3 g in sodium chloride 0.9 % 100 mL IVPB  Status:  Discontinued     01/06/19 1033   01/02/19 1715  Ampicillin-Sulbactam (UNASYN) 3 g in sodium chloride 0.9 % 100 mL IVPB  Status:  Discontinued     01/03/19 0946   01/02/19 1545  metroNIDAZOLE (FLAGYL) IVPB 500 mg     01/03/19 0000   01/02/19 1515  cefTRIAXone (ROCEPHIN) 1 g in sodium chloride 0.9 % 100 mL IVPB     01/02/19 1722   01/02/19 1515  azithromycin (ZITHROMAX) 500 mg in sodium chloride 0.9 % 250 mL IVPB  Status:  Discontinued     01/02/19 1534     Devices 6 mm cuffed trach       >>>   Lines/tubes PIC double-lumen>> Rectal tube 3/31>> NG tube 3/30>>>     Discharge Exam: Vitals:   01/31/19 0312 01/31/19 0338 01/31/19 0759 01/31/19 1103  BP: 133/83  133/80 136/87  Pulse: 86  80 89  Resp: (!) 26 (!) 21 (!) 21 (!) 25  Temp: 97.9 F (36.6 C)  98.6 F (37 C) 98.3 F (36.8 C)  TempSrc: Axillary  Oral Oral  SpO2: 100%  100% 100%  Weight:      Height:        General: No acute respiratory distress, cachectic Eyes: negative scleral hemorrhage, negative anisocoria, negative icterus ENT: Negative Runny nose, negative gingival bleeding, Neck:  Negative scars, masses, torticollis, lymphadenopathy, JVD Lungs: Clear to auscultation bilaterally without wheezes or crackles Cardiovascular: Regular rate and rhythm without murmur gallop or rub normal S1 and S2 Abdomen: negative abdominal pain, nondistended, positive soft, bowel sounds, no rebound, no ascites, no appreciable mass Extremities: No significant cyanosis,  clubbing, or edema bilateral lower extremities Skin: Negative rashes, lesions, ulcers Psychiatric: Unable to evaluate secondary to patient's altered mental status.  Central nervous system: Patient withdraws to painful stimuli around neck and face: Slight withdraw to painful stimuli left hand.  Unable to evaluate further secondary to altered mental status.    Discharge Instructions   Allergies as of 01/31/2019      Reactions   Ace Inhibitors Other (See Comments)   Acute renal failure      Medication List    STOP taking these medications   atorvastatin 10 MG tablet Commonly known as:  LIPITOR   diltiazem 180 MG 24 hr capsule Commonly known as:  CARDIZEM CD     TAKE these medications   acetaminophen 160 MG/5ML solution Commonly known as:  TYLENOL Place 20.3 mLs (650 mg total) into feeding tube every 6 (six) hours as needed for mild pain, headache or fever.   albuterol (2.5 MG/3ML) 0.083% nebulizer solution Commonly known as:  PROVENTIL Take 3 mLs (2.5 mg total) by nebulization every  2 (two) hours as needed for wheezing.   amantadine 50 MG/5ML solution Commonly known as:  SYMMETREL Take 10 mLs (100 mg total) by mouth daily.   aspirin EC 81 MG tablet Take 1 tablet (81 mg total) by mouth daily. What changed:  when to take this   diltiazem 10 mg/ml  oral suspension Commonly known as:  CARDIZEM Take 6 mLs (60 mg total) by mouth 4 (four) times daily.   docusate 50 MG/5ML liquid Commonly known as:  COLACE Take 10 mLs (100 mg total) by mouth 2 (two) times daily.   enoxaparin 40 MG/0.4ML injection Commonly known as:  LOVENOX Inject 0.4 mLs (40 mg total) into the skin daily for 5 days.   feeding supplement (JEVITY 1.2 CAL) Liqd 33m/hr   feeding supplement (PRO-STAT SUGAR FREE 64) Liqd Take 30 mLs by mouth 2 (two) times daily.   free water Soln Place 200 mLs into feeding tube every 6 (six) hours.   Gerhardt's butt cream Crea Apply 1 application topically 2 (two)  times daily.   hydrALAZINE 20 MG/ML injection Commonly known as:  APRESOLINE Inject 0.5 mLs (10 mg total) into the vein every 6 (six) hours as needed (SBP>210).   insulin aspart 100 UNIT/ML injection Commonly known as:  NovoLOG Moderate SSI   lacosamide 10 MG/ML oral solution Commonly known as:  Vimpat Take 20 mLs (200 mg total) by mouth 2 (two) times daily.   levETIRAcetam 100 MG/ML solution Commonly known as:  Keppra Take 15 mLs (1,500 mg total) by mouth 2 (two) times daily.   LORazepam 2 MG/ML injection Commonly known as:  Ativan Inject 0.5 mLs (1 mg total) into the vein every 6 (six) hours as needed for seizure.   mouth rinse Liqd solution 15 mLs by Mouth Rinse route 6 (six) times daily.   multivitamin Liqd Place 15 mLs into feeding tube daily. Start taking on:  February 01, 2019   pantoprazole sodium 40 mg/20 mL Pack Commonly known as:  PROTONIX Place 20 mLs (40 mg total) into feeding tube daily.   phenytoin 125 MG/5ML suspension Commonly known as:  DILANTIN Take 3 mLs (75 mg total) by mouth 3 (three) times daily.   topiramate 100 MG tablet Commonly known as:  Topamax Place 1 tablet (100 mg total) into feeding tube 2 (two) times daily.   topiramate 25 MG tablet Commonly known as:  TOPAMAX Place 3 tablets (75 mg total) into feeding tube 2 (two) times daily.      Allergies  Allergen Reactions  . Ace Inhibitors Other (See Comments)    Acute renal failure      The results of significant diagnostics from this hospitalization (including imaging, microbiology, ancillary and laboratory) are listed below for reference.    Significant Diagnostic Studies: Ct Angio Head W Or Wo Contrast  Result Date: 01/02/2019 CLINICAL DATA:  68year old female found down with right side gaze. EXAM: CT ANGIOGRAPHY HEAD AND NECK CT PERFUSION BRAIN TECHNIQUE: Multidetector CT imaging of the head and neck was performed using the standard protocol during bolus administration of  intravenous contrast. Multiplanar CT image reconstructions and MIPs were obtained to evaluate the vascular anatomy. Carotid stenosis measurements (when applicable) are obtained utilizing NASCET criteria, using the distal internal carotid diameter as the denominator. Multiphase CT imaging of the brain was performed following IV bolus contrast injection. Subsequent parametric perfusion maps were calculated using RAPID software. CONTRAST:  1068mOMNIPAQUE IOHEXOL 350 MG/ML SOLN COMPARISON:  Head CT without contrast 1245 hours today. Cervical  spine CT 01/27/2018. Brain MRI and intracranial MRA 09/28/2013. FINDINGS: CT Brain Perfusion Findings: Intermittently motion degraded. CBF (<30%) Volume: None Perfusion (Tmax>6.0s) volume: None Mismatch Volume: Not applicable Infarction Location:Not applicable CTA NECK Skeleton: Poor dentition. Cervical spine appears stable. No acute osseous abnormality identified. Upper chest: Confluent dependent right upper lobe peribronchial opacity with some consolidation suspected. Visible left lung is clear. Visible trachea is clear. No superior mediastinal lymphadenopathy. Other neck: Negative. Aortic arch: 3 vessel arch configuration. Mild arch and great vessel origin atherosclerosis. Right carotid system: Mild brachiocephalic artery plaque without stenosis. Tortuous proximal right CCA without stenosis. Mild soft and calcified plaque in the proximal right ICA with tortuosity and no stenosis. Left carotid system: Mildly tortuous proximal left CCA without stenosis. Partially retropharyngeal course of the left CCA. Mild soft and calcified plaque at the left ICA origin and bulb without stenosis. Tortuous cervical left ICA. Vertebral arteries: Minor plaque in the proximal right subclavian artery without stenosis. Normal right vertebral artery origin. The right vertebral is dominant and patent to the skull base without stenosis. Mild plaque in the proximal left subclavian artery without stenosis.  Mild calcified plaque at the left vertebral artery origin without stenosis. Non dominant left vertebral artery is patent to the skull base without stenosis. CTA HEAD Posterior circulation: Dominant right vertebral artery with V4 calcified plaque but no stenosis. Tortuous vertebrobasilar junction. Patent right PICA origin. Tortuous non dominant left vertebral artery with normal PICA origin and no stenosis. 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 Ectatic basilar tip with widely patent SCA and PCA origins. Posterior communicating arteries are diminutive or absent. Left PCA branches are within normal limits. The proximal right PCA is appear normal but there is suggestion of superior division right P3 occlusion on series 11, image 14. but this could be chronic, it is unclear whether this was present on the 2014 MRA. Anterior circulation: Both ICA siphons are patent. On the left there is moderate calcified plaque, and moderate to severe supraclinoid left ICA stenosis due to calcified plaque seen on series 6, image 218. Normal left ophthalmic artery origin. On the right there is abundant similar calcified plaque, also with moderate to severe supraclinoid ICA stenosis. See series 8, images 94 and 95. Normal right ophthalmic artery origin. Patent carotid termini, normal MCA and ACA origins. Tortuous proximal ACAs. Anterior communicating artery is diminutive or absent. ACA branches are within normal limits. Left MCA M1 segment and bifurcation are patent without stenosis. Left MCA branches are within normal limits. Right MCA M1 is also tortuous. Right MCA bifurcation is patent without stenosis. Right MCA branches appear stable since the 2014 MRA. Venous sinuses: Early contrast timing but the major dural venous sinuses appear grossly patent. Anatomic variants: Dominant right vertebral artery. Review of the MIP images confirms the above findings IMPRESSION: 1. Negative for large vessel  occlusion, and no core infarct or ischemic penumbra detected by CTP. 2. Questionable distal Right PCA P3 superior division occlusion. This might be chronic. 3. Generalized arterial tortuosity with no stenosis in the neck or the posterior circulation, but Moderate to Severe Bilateral Supraclinoid ICA stenosis due to bulky calcified plaque. 4. Right Upper Lobe Pneumonia partially visible. These results were communicated to Dr. Lorraine Lax at 1:27 pmon 3/5/2020by text page via the [facility] messaging system. Electronically Signed   By: Genevie Ann M.D.   On: 01/02/2019 13:27   Ct Angio Neck W Or Wo Contrast  Result Date: 01/02/2019 CLINICAL DATA:  68 year old female found  down with right side gaze. EXAM: CT ANGIOGRAPHY HEAD AND NECK CT PERFUSION BRAIN TECHNIQUE: Multidetector CT imaging of the head and neck was performed using the standard protocol during bolus administration of intravenous contrast. Multiplanar CT image reconstructions and MIPs were obtained to evaluate the vascular anatomy. Carotid stenosis measurements (when applicable) are obtained utilizing NASCET criteria, using the distal internal carotid diameter as the denominator. Multiphase CT imaging of the brain was performed following IV bolus contrast injection. Subsequent parametric perfusion maps were calculated using RAPID software. CONTRAST:  141m OMNIPAQUE IOHEXOL 350 MG/ML SOLN COMPARISON:  Head CT without contrast 1245 hours today. Cervical spine CT 01/27/2018. Brain MRI and intracranial MRA 09/28/2013. FINDINGS: CT Brain Perfusion Findings: Intermittently motion degraded. CBF (<30%) Volume: None Perfusion (Tmax>6.0s) volume: None Mismatch Volume: Not applicable Infarction Location:Not applicable CTA NECK Skeleton: Poor dentition. Cervical spine appears stable. No acute osseous abnormality identified. Upper chest: Confluent dependent right upper lobe peribronchial opacity with some consolidation suspected. Visible left lung is clear. Visible trachea is  clear. No superior mediastinal lymphadenopathy. Other neck: Negative. Aortic arch: 3 vessel arch configuration. Mild arch and great vessel origin atherosclerosis. Right carotid system: Mild brachiocephalic artery plaque without stenosis. Tortuous proximal right CCA without stenosis. Mild soft and calcified plaque in the proximal right ICA with tortuosity and no stenosis. Left carotid system: Mildly tortuous proximal left CCA without stenosis. Partially retropharyngeal course of the left CCA. Mild soft and calcified plaque at the left ICA origin and bulb without stenosis. Tortuous cervical left ICA. Vertebral arteries: Minor plaque in the proximal right subclavian artery without stenosis. Normal right vertebral artery origin. The right vertebral is dominant and patent to the skull base without stenosis. Mild plaque in the proximal left subclavian artery without stenosis. Mild calcified plaque at the left vertebral artery origin without stenosis. Non dominant left vertebral artery is patent to the skull base without stenosis. CTA HEAD Posterior circulation: Dominant right vertebral artery with V4 calcified plaque but no stenosis. Tortuous vertebrobasilar junction. Patent right PICA origin. Tortuous non dominant left vertebral artery with normal PICA origin and no stenosis. 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 Ectatic basilar tip with widely patent SCA and PCA origins. Posterior communicating arteries are diminutive or absent. Left PCA branches are within normal limits. The proximal right PCA is appear normal but there is suggestion of superior division right P3 occlusion on series 11, image 14. but this could be chronic, it is unclear whether this was present on the 2014 MRA. Anterior circulation: Both ICA siphons are patent. On the left there is moderate calcified plaque, and moderate to severe supraclinoid left ICA stenosis due to calcified plaque seen on series 6, image 218.  Normal left ophthalmic artery origin. On the right there is abundant similar calcified plaque, also with moderate to severe supraclinoid ICA stenosis. See series 8, images 94 and 95. Normal right ophthalmic artery origin. Patent carotid termini, normal MCA and ACA origins. Tortuous proximal ACAs. Anterior communicating artery is diminutive or absent. ACA branches are within normal limits. Left MCA M1 segment and bifurcation are patent without stenosis. Left MCA branches are within normal limits. Right MCA M1 is also tortuous. Right MCA bifurcation is patent without stenosis. Right MCA branches appear stable since the 2014 MRA. Venous sinuses: Early contrast timing but the major dural venous sinuses appear grossly patent. Anatomic variants: Dominant right vertebral artery. Review of the MIP images confirms the above findings IMPRESSION: 1. Negative for large vessel occlusion, and  no core infarct or ischemic penumbra detected by CTP. 2. Questionable distal Right PCA P3 superior division occlusion. This might be chronic. 3. Generalized arterial tortuosity with no stenosis in the neck or the posterior circulation, but Moderate to Severe Bilateral Supraclinoid ICA stenosis due to bulky calcified plaque. 4. Right Upper Lobe Pneumonia partially visible. These results were communicated to Dr. Lorraine Lax at 1:27 pmon 3/5/2020by text page via the [facility] messaging system. Electronically Signed   By: Genevie Ann M.D.   On: 01/02/2019 13:27   Mr Brain Wo Contrast  Result Date: 01/07/2019 CLINICAL DATA:  Encephalopathy. Altered mental status. Possible anoxic brain injury. EXAM: MRI HEAD WITHOUT CONTRAST TECHNIQUE: Multiplanar, multiecho pulse sequences of the brain and surrounding structures were obtained without intravenous contrast. COMPARISON:  CTA head neck and perfusion, 01/02/2019 FINDINGS: BRAIN: There is no acute infarct, acute hemorrhage, hydrocephalus or extra-axial collection. The midline structures are normal. Old small  vessel infarcts of the right basal ganglia. Diffuse confluent hyperintense T2-weighted signal within the periventricular, deep and juxtacortical white matter, most commonly due to chronic ischemic microangiopathy. Advanced atrophy for age. Susceptibility-sensitive sequences show no chronic microhemorrhage or superficial siderosis. VASCULAR: Major intracranial arterial and venous sinus flow voids are normal. SKULL AND UPPER CERVICAL SPINE: Calvarial bone marrow signal is normal. There is no skull base mass. Visualized upper cervical spine and soft tissues are normal. SINUSES/ORBITS: Mastoid effusions. Fluid within the sphenoid sinuses and nasopharynx. Mild maxillary and ethmoid mucosal thickening. The orbits are normal. IMPRESSION: 1. No acute intracranial abnormality. No findings of acute hypoxic ischemic injury. 2. 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 Electronically Signed   By: Ulyses Jarred M.D.   On: 01/07/2019 15:37   Mr Brain Wo Contrast  Result Date: 01/02/2019 Phillips Odor, MD     01/02/2019  7:39 PM Charlotte A. Merlene Laughter, MD     [email or web address]       HISTORY: This is a 68 year old who presents with altered mental status and right gaze deviation.  The semiology suspicious for seizures. MEDICATIONS: Current Facility-Administered Medications: .  [COMPLETED] sodium chloride 0.9 % bolus 1,000 mL, 1,000 mL, Intravenous, Once, Stopped at 01/02/19 1409 **AND** 0.9 %  sodium chloride infusion, , Intravenous, Continuous, Domenic Moras, PA-C .  cefTRIAXone (ROCEPHIN) 1 g in sodium chloride 0.9 % 100 mL IVPB, 1 g, Intravenous, Once, Domenic Moras, PA-C .  [START ON 01/03/2019] levETIRAcetam (KEPPRA) IVPB 500 mg/100 mL premix, 500 mg, Intravenous, Q12H, Marliss Coots, PA-C .  LORazepam (ATIVAN) 2 MG/ML injection, , , , .  metroNIDAZOLE (FLAGYL) IVPB 500 mg, 500 mg, Intravenous, Once, Domenic Moras, PA-C Current Outpatient Medications: .   aspirin EC 81 MG tablet, Take 81 mg by mouth 2 (two) times daily. , Disp: , Rfl: .  atorvastatin (LIPITOR) 10 MG tablet, Take 10 mg by mouth daily., Disp: , Rfl: .  diltiazem (CARDIZEM CD) 180 MG 24 hr capsule, Take 180 mg by mouth 2 (two) times daily., Disp: , Rfl: ANALYSIS: A 16 channel recording using standard 10 20 measurements is conducted for 37 minutes.  The back activity gets as high as 6 Hz.  There is intermittent rhythmic delta activity seen that is generalized and with frontocentral maximum.  There is continuous complex involving the right temporal region with phase reversal at T4.  These complexes consist of spike slow wave activity, sharp wave activity and triphasic waves all phase reversing at T3.  Photic stimulation and hyperventilation are not  carried out. IMPRESSION: This recording is abnormal for the following reasons: 1.  Continuous epileptiform discharge involving the right temporal region meeting the electrographic criteria for for status epilepticus. 2.  Moderate global slowing indicating a moderate global encephalopathy. Kofi A. Merlene Laughter, M.D. Diplomate, Tax adviser of Psychiatry and Neurology ( Neurology).   Ct C-spine No Charge  Result Date: 01/02/2019 CLINICAL DATA:  Found on the floor. Right sided gaze. Decreased responsiveness. EXAM: CT CERVICAL SPINE WITHOUT CONTRAST TECHNIQUE: Multidetector CT imaging of the cervical spine was performed without intravenous contrast. Multiplanar CT image reconstructions were also generated. COMPARISON:  01/27/2018. Head and neck CTA obtained today. FINDINGS: Alignment: Normal. Skull base and vertebrae: No acute fracture. No primary bone lesion or focal pathologic process. Soft tissues and spinal canal: No prevertebral fluid or swelling. No visible canal hematoma. Disc levels:  Mild multilevel degenerative changes. Upper chest: Again demonstrated is patchy and dense consolidation in the posterior aspect of the right upper lobe. Other: Normal  appearing thyroid gland. Intravascular contrast from the recent CTA. IMPRESSION: 1. No cervical spine fracture or subluxation. 2. Mild multilevel degenerative changes. 3. Stable patchy and dense consolidation in the posterior aspect of the right upper lobe. This has an appearance compatible with pneumonia, possibly due to aspiration. Electronically Signed   By: Claudie Revering M.D.   On: 01/02/2019 14:20   Ct Cerebral Perfusion W Contrast  Result Date: 01/02/2019 CLINICAL DATA:  68 year old female found down with right side gaze. EXAM: CT ANGIOGRAPHY HEAD AND NECK CT PERFUSION BRAIN TECHNIQUE: Multidetector CT imaging of the head and neck was performed using the standard protocol during bolus administration of intravenous contrast. Multiplanar CT image reconstructions and MIPs were obtained to evaluate the vascular anatomy. Carotid stenosis measurements (when applicable) are obtained utilizing NASCET criteria, using the distal internal carotid diameter as the denominator. Multiphase CT imaging of the brain was performed following IV bolus contrast injection. Subsequent parametric perfusion maps were calculated using RAPID software. CONTRAST:  137m OMNIPAQUE IOHEXOL 350 MG/ML SOLN COMPARISON:  Head CT without contrast 1245 hours today. Cervical spine CT 01/27/2018. Brain MRI and intracranial MRA 09/28/2013. FINDINGS: CT Brain Perfusion Findings: Intermittently motion degraded. CBF (<30%) Volume: None Perfusion (Tmax>6.0s) volume: None Mismatch Volume: Not applicable Infarction Location:Not applicable CTA NECK Skeleton: Poor dentition. Cervical spine appears stable. No acute osseous abnormality identified. Upper chest: Confluent dependent right upper lobe peribronchial opacity with some consolidation suspected. Visible left lung is clear. Visible trachea is clear. No superior mediastinal lymphadenopathy. Other neck: Negative. Aortic arch: 3 vessel arch configuration. Mild arch and great vessel origin atherosclerosis.  Right carotid system: Mild brachiocephalic artery plaque without stenosis. Tortuous proximal right CCA without stenosis. Mild soft and calcified plaque in the proximal right ICA with tortuosity and no stenosis. Left carotid system: Mildly tortuous proximal left CCA without stenosis. Partially retropharyngeal course of the left CCA. Mild soft and calcified plaque at the left ICA origin and bulb without stenosis. Tortuous cervical left ICA. Vertebral arteries: Minor plaque in the proximal right subclavian artery without stenosis. Normal right vertebral artery origin. The right vertebral is dominant and patent to the skull base without stenosis. Mild plaque in the proximal left subclavian artery without stenosis. Mild calcified plaque at the left vertebral artery origin without stenosis. Non dominant left vertebral artery is patent to the skull base without stenosis. CTA HEAD Posterior circulation: Dominant right vertebral artery with V4 calcified plaque but no stenosis. Tortuous vertebrobasilar junction. Patent right PICA origin. Tortuous non dominant left vertebral  artery with normal PICA origin and no stenosis. 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 Ectatic basilar tip with widely patent SCA and PCA origins. Posterior communicating arteries are diminutive or absent. Left PCA branches are within normal limits. The proximal right PCA is appear normal but there is suggestion of superior division right P3 occlusion on series 11, image 14. but this could be chronic, it is unclear whether this was present on the 2014 MRA. Anterior circulation: Both ICA siphons are patent. On the left there is moderate calcified plaque, and moderate to severe supraclinoid left ICA stenosis due to calcified plaque seen on series 6, image 218. Normal left ophthalmic artery origin. On the right there is abundant similar calcified plaque, also with moderate to severe supraclinoid ICA stenosis. See series 8,  images 94 and 95. Normal right ophthalmic artery origin. Patent carotid termini, normal MCA and ACA origins. Tortuous proximal ACAs. Anterior communicating artery is diminutive or absent. ACA branches are within normal limits. Left MCA M1 segment and bifurcation are patent without stenosis. Left MCA branches are within normal limits. Right MCA M1 is also tortuous. Right MCA bifurcation is patent without stenosis. Right MCA branches appear stable since the 2014 MRA. Venous sinuses: Early contrast timing but the major dural venous sinuses appear grossly patent. Anatomic variants: Dominant right vertebral artery. Review of the MIP images confirms the above findings IMPRESSION: 1. Negative for large vessel occlusion, and no core infarct or ischemic penumbra detected by CTP. 2. Questionable distal Right PCA P3 superior division occlusion. This might be chronic. 3. Generalized arterial tortuosity with no stenosis in the neck or the posterior circulation, but Moderate to Severe Bilateral Supraclinoid ICA stenosis due to bulky calcified plaque. 4. Right Upper Lobe Pneumonia partially visible. These results were communicated to Dr. Lorraine Lax at 1:27 pmon 3/5/2020by text page via the [facility] messaging system. Electronically Signed   By: Genevie Ann M.D.   On: 01/02/2019 13:27   Portable Chest X-ray  Result Date: 01/28/2019 CLINICAL DATA:  Pneumonia EXAM: PORTABLE CHEST 1 VIEW COMPARISON:  01/27/2019 FINDINGS: Tracheostomy, right PICC line remain in place, unchanged. Feeding tube is in place, projecting off the lower aspect of the film. The tip is not visualized. Increasing bibasilar atelectasis or infiltrates, left greater than right. Heart is borderline in size. Suspect small left effusion. No acute bony abnormality. IMPRESSION: Bibasilar atelectasis or infiltrates, left greater than right, increasing since prior study. Suspect small left effusion. Electronically Signed   By: Rolm Baptise M.D.   On: 01/28/2019 07:20   Dg Chest  Port 1 View  Result Date: 01/27/2019 CLINICAL DATA:  Aspiration.  History of stroke and hypertension. EXAM: PORTABLE CHEST 1 VIEW COMPARISON:  01/25/2019 and prior exams FINDINGS: Left base opacity is unchanged from the prior study. Mild left perihilar opacity appears new or increased, but this apparent change is most likely positional only. Mild streaky opacity at the right lung base consistent with atelectasis is stable. Remainder of the lungs is clear. Tracheostomy tube and right PICC are stable. IMPRESSION: 1. No significant interval change from the most recent prior study. 2. Left lung base opacity consistent with atelectasis or pneumonia/pneumonitis. Mild persistent right lung base atelectasis. Electronically Signed   By: Lajean Manes M.D.   On: 01/27/2019 15:42   Dg Chest Port 1 View  Result Date: 01/25/2019 CLINICAL DATA:  Respiratory failure. EXAM: PORTABLE CHEST 1 VIEW COMPARISON:  Radiograph of January 20, 2019. FINDINGS: Stable cardiomediastinal silhouette. Tracheostomy and feeding  tubes are unchanged in position. Right-sided PICC line is unchanged in position. No pneumothorax is noted. Stable right basilar atelectasis is noted. Mildly increased left basilar opacity is noted suggesting worsening atelectasis or possibly infiltrate. Bony thorax is unremarkable. IMPRESSION: Stable right basilar subsegmental atelectasis. Mildly increased left basilar opacity is noted suggesting worsening atelectasis or possibly infiltrate. Stable support apparatus. Electronically Signed   By: Marijo Conception, M.D.   On: 01/25/2019 07:34   Dg Chest Port 1 View  Result Date: 01/20/2019 CLINICAL DATA:  Status post tracheostomy placement EXAM: PORTABLE CHEST 1 VIEW COMPARISON:  01/03/2019 FINDINGS: Tracheostomy is noted in satisfactory position. Feeding catheter is seen within the stomach. Right-sided PICC line is noted at the cavoatrial junction. Cardiac shadow is within normal limits. Improved aeration in the right  upper lobe is noted. Minimal right basilar atelectasis is seen. No bony abnormality is noted. IMPRESSION: Tracheostomy in satisfactory position. Mild right basilar atelectasis is noted. Significant improved aeration in the right upper lobe is seen. Electronically Signed   By: Inez Catalina M.D.   On: 01/20/2019 16:29   Dg Chest Port 1 View  Result Date: 01/03/2019 CLINICAL DATA:  PICC placement EXAM: PORTABLE CHEST 1 VIEW COMPARISON:  01/03/2019 at 0025 hours, and older exams. FINDINGS: History said PICC placement.  No PICC visualized. There is a new left internal jugular central venous line with its tip projecting at the caval atrial junction. No pneumothorax. Endotracheal tube has been retracted, tip now projects 2.5 cm above the carina. Nasal/orogastric tube passes well below the diaphragm into at least the mid stomach, below the included field of view, further inserted than on the prior radiograph. There is patchy airspace opacity in the right upper lobe that has become apparent since the exam dated 01/02/2019. Remainder of the lungs is clear. IMPRESSION: 1. New left internal jugular central venous line is well positioned, tip projecting at the caval atrial junction. No pneumothorax. 2. Well-positioned endotracheal and nasal/orogastric tubes. 3. Patchy airspace opacity right upper lobe consistent with pneumonia in the proper clinical setting. Electronically Signed   By: Lajean Manes M.D.   On: 01/03/2019 13:43   Dg Chest Port 1 View  Result Date: 01/03/2019 CLINICAL DATA:  Intubation. EXAM: PORTABLE CHEST 1 VIEW COMPARISON:  Radiograph earlier this day. FINDINGS: Tip of the endotracheal tube is in the proximal right mainstem bronchus. Recommend retraction of 2-3 cm. Tip of the enteric tube below the diaphragm in the stomach, side-port just at the gastroesophageal junction. Increasing opacity in the right perihilar lung likely related to endotracheal tube positioning. Low lung volumes persist. Unchanged  heart size and mediastinal contours. No pneumothorax. IMPRESSION: 1. Tip of the endotracheal tube in the proximal right mainstem bronchus. Recommend retraction of 2-3 cm. 2. Tip of the enteric tube below the diaphragm in the stomach, side-port just at the gastroesophageal junction. 3. Increasing opacities in the right perihilar lung are likely combination of airspace disease seen on prior neck CTA and atelectasis related to right mainstem bronchus intubation. These results will be called to the ordering clinician or representative by the Radiologist Assistant, and communication documented in the PACS or zVision Dashboard. Electronically Signed   By: Keith Rake M.D.   On: 01/03/2019 00:50   Dg Chest Portable 1 View  Result Date: 01/02/2019 CLINICAL DATA:  Initial evaluation for possible infection. EXAM: PORTABLE CHEST 1 VIEW COMPARISON:  Prior radiograph from 01/27/2018. FINDINGS: Cardiomegaly, stable. Mediastinal silhouette within normal limits. Tortuosity the intrathoracic aorta noted. Lungs are  hypoinflated. Secondary mild bibasilar bronchovascular crowding. No consolidative opacity. No edema or effusion. No pneumothorax. No acute osseous finding. IMPRESSION: Shallow lung inflation with no active cardiopulmonary disease identified. Electronically Signed   By: Jeannine Boga M.D.   On: 01/02/2019 14:51   Dg Abd Portable 1v  Result Date: 01/27/2019 CLINICAL DATA:  NG tube placement EXAM: PORTABLE ABDOMEN - 1 VIEW COMPARISON:  None. FINDINGS: There is no nasogastric tube identified. There is no bowel dilatation to suggest obstruction. There is no evidence of pneumoperitoneum, portal venous gas or pneumatosis. There are no pathologic calcifications along the expected course of the ureters. The osseous structures are unremarkable. IMPRESSION: No nasogastric tube identified. Electronically Signed   By: Kathreen Devoid   On: 01/27/2019 14:14   Dg Abd Portable 1v  Result Date: 01/09/2019 CLINICAL DATA:   Orogastric tube placement. EXAM: PORTABLE ABDOMEN - 1 VIEW COMPARISON:  None. FINDINGS: Enteric catheter descends below the left hemidiaphragm, however descends steeply caudally in a course which is not typical for intragastric location. The bowel gas pattern is normal. No radio-opaque calculi or other significant radiographic abnormality are seen. IMPRESSION: Enteric catheter descends below the left hemidiaphragm in a course which is not typical for intragastric location. This may represent an unusual anatomic location of the stomach or an extraluminal location of the catheter. If no prior imaging is available for comparison, CT of the abdomen may be considered for position confirmation. Electronically Signed   By: Fidela Salisbury M.D.   On: 01/09/2019 10:58   Ct Head Code Stroke Wo Contrast`  Result Date: 01/02/2019 CLINICAL DATA:  Code stroke. 67 year old female found down today with rightward gaze. EXAM: CT HEAD WITHOUT CONTRAST TECHNIQUE: Contiguous axial images were obtained from the base of the skull through the vertex without intravenous contrast. COMPARISON:  Head CT 01/27/2018 and earlier. FINDINGS: Brain: Advanced chronic cerebral white matter hypodensity and previous infarct of the right basal ganglia with ex vacuo ventricular enlargement. No acute intracranial hemorrhage identified. White matter hypodensity appears increased since 2019 bilaterally, but no acute cortically based infarct is identified. However, hypodensity in the right thalamus appears increased on series 3, image 16. Elsewhere stable deep gray matter nuclei. No midline shift, mass effect, or evidence of intracranial mass lesion. Vascular: Calcified atherosclerosis at the skull base. No suspicious intracranial vascular hyperdensity. Skull: No acute osseous abnormality identified. Sinuses/Orbits: Mild paranasal sinus mucosal thickening is stable since 2019. Tympanic cavities and mastoids are clear. Other: No acute orbit or scalp  soft tissue finding. ASPECTS [facility] Stroke Program Early CT Score) - Ganglionic level infarction (caudate, lentiform nuclei, internal capsule, insula, M1-M3 cortex): 7 - Supraganglionic infarction (M4-M6 cortex): 3 Total score (0-10 with 10 being normal): 10 IMPRESSION: 1. 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 2. No acute cortically based infarct or acute intracranial hemorrhage identified. 3. These results were communicated to Dr. Lorraine Lax at 1:05 pm on 01/02/2019 by text page via the [facility] messaging system. Electronically Signed   By: Genevie Ann M.D.   On: 01/02/2019 13:05   Ir Picc Placement Right >5 Yrs Inc Img Guide  Result Date: 01/09/2019 INDICATION: Poor venous access. In need of durable intravenous access for medication administrations and blood draws while the patient remains in the ICU. EXAM: ULTRASOUND AND FLUOROSCOPIC GUIDED PICC LINE INSERTION MEDICATIONS: None. CONTRAST:  None FLUOROSCOPY TIME:  54 seconds (0.6 mGy) COMPLICATIONS: None immediate. TECHNIQUE: The procedure, risks,  benefits, and alternatives were explained to the patient's family and informed written consent was obtained. A timeout was performed prior to the initiation of the procedure. The right upper extremity was prepped with chlorhexidine in a sterile fashion, and a sterile drape was applied covering the operative field. Maximum barrier sterile technique with sterile gowns and gloves were used for the procedure. A timeout was performed prior to the initiation of the procedure. Local anesthesia was provided with 1% lidocaine. Under direct ultrasound guidance, the basilic vein was accessed with a micropuncture kit after the overlying soft tissues were anesthetized with 1% lidocaine. After the overlying soft tissues were anesthetized, a small venotomy incision was created and a micropuncture kit was utilized to access the right basilic  vein. Real-time ultrasound guidance was utilized for vascular access including the acquisition of a permanent ultrasound image documenting patency of the accessed vessel. A guidewire was advanced to the level of the superior caval-atrial junction for measurement purposes and the PICC line was cut to length. A peel-away sheath was placed and a 37 cm, 5 [place], dual lumen was inserted to level of the superior caval-atrial junction. A post procedure spot fluoroscopic was obtained. The catheter easily aspirated and flushed and was sutured in place. A dressing was placed. The patient tolerated the procedure well without immediate post procedural complication. FINDINGS: After catheter placement, the tip lies within the superior cavoatrial junction. The catheter aspirates and flushes normally and is ready for immediate use. IMPRESSION: Successful ultrasound and fluoroscopic guided placement of a right basilic vein approach, 37 cm, 5 French, dual lumen PICC with tip at the superior caval-atrial junction. The PICC line is ready for immediate use. Electronically Signed   By: Sandi Mariscal M.D.   On: 01/09/2019 09:27    Microbiology: Recent Results (from the past 240 hour(s))  Culture, respiratory (non-expectorated)     Status: None   Collection Time: 01/27/19  4:14 PM  Result Value Ref Range Status   Specimen Description TRACHEAL ASPIRATE  Final   Special Requests NONE  Final   Gram Stain   Final    ABUNDANT WBC PRESENT,BOTH PMN AND MONONUCLEAR FEW GRAM VARIABLE ROD RARE GRAM POSITIVE COCCI    Culture   Final    Consistent with normal respiratory flora. Performed at [facility], [place] [address] [place], [place] [address]    Report Status 01/29/2019 FINAL  Final     Labs: Basic Metabolic Panel: Recent Labs  Lab 01/25/19 0555  01/26/19 0422 01/27/19 0516 01/28/19 0510 01/29/19 0500 01/30/19 1000 01/31/19 0603  NA 136   < > 134* 135 133* 138 137 138  K 3.5   < > 3.8 3.3* 4.3 3.5 4.0 4.1   CL 104   < > 100 103 102 103 102 103  CO2 23   < > 22 21* 21* '23 26 23  '$ GLUCOSE 126*   < > 114* 157* 115* 125* 147* 131*  BUN 19   < > 24* 28* 28* '17 13 16  '$ CREATININE 0.56   < > 0.57 0.71 0.95 0.90 0.69 0.71  CALCIUM 8.7*   < > 9.1 8.1* 9.0 9.3 9.6 9.6  MG 1.7  --  1.7  --  2.6*  --  1.9  --   PHOS 2.8  --   --   --   --   --   --   --    < > = values in this interval not displayed.  Liver Function Tests: Recent Labs  Lab 01/26/19 0422 01/27/19 0516 01/29/19 0500 01/31/19 0603  AST 49* 47* 56*  --   ALT 83* 80* 88*  --   ALKPHOS 99 89 98  --   BILITOT 0.3 0.3 0.3  --   PROT 6.7 6.0* 6.7  --   ALBUMIN 2.0* 1.9* 1.9* 2.2*   No results for input(s): LIPASE, AMYLASE in the last 168 hours. Recent Labs  Lab 01/26/19 0422  AMMONIA 34   CBC: Recent Labs  Lab 01/25/19 0555 01/25/19 1155 01/26/19 0422 01/27/19 0516 01/29/19 0500 01/30/19 1000  WBC 6.1 5.7 5.3 4.6 6.6 4.5  NEUTROABS 4.1  --   --   --   --   --   HGB 10.3* 9.5* 10.5* 9.6* 9.7* 10.7*  HCT 32.3* 29.5* 33.1* 30.7* 30.7* 33.7*  MCV 93.6 92.5 94.0 93.6 94.2 91.8  PLT 328 376 334 305 304 300   Cardiac Enzymes: No results for input(s): CKTOTAL, CKMB, CKMBINDEX, TROPONINI in the last 168 hours. BNP: BNP (last 3 results) No results for input(s): BNP in the last 8760 hours.  ProBNP (last 3 results) No results for input(s): PROBNP in the last 8760 hours.  CBG: Recent Labs  Lab 01/30/19 1945 01/30/19 2303 01/31/19 0310 01/31/19 0754 01/31/19 1103  GLUCAP 139* 139* 145* 132* 120*       Signed:  Dia Crawford, MD [facility] [phone] pager

## 2019-01-31 NOTE — Progress Notes (Signed)
Physical Therapy Treatment Patient Details Name: Erika Wang MRN: [id number] DOB: 04-Aug-1951 Today's Date: 01/31/2019    History of Present Illness  Erika Wang is a 68 y.o. 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 EEG found patient to have seizure like activity. Pt remains unresponsive, intubated on 3/6 and now has a tracheostomy on 3/23.    PT Comments    Pt is mostly status quo, but did have eyes open 50% of time.  No purposeful responses to stimuli.  Emphasis on ROM, transition to EOB, sitting EOB.    Follow Up Recommendations  LTACH     Equipment Recommendations  None recommended by PT    Recommendations for Other Services       Precautions / Restrictions Precautions Precautions: Fall Precaution Comments: pt now on 28% TC    Mobility  Bed Mobility Overal bed mobility: Needs Assistance Bed Mobility: Supine to Sit;Rolling;Sit to Supine     Supine to sit: Total assist Sit to supine: Total assist   General bed mobility comments: pivoted with bed pads, up to EOB without pt assist.  Transfers                    Ambulation/Gait                 Stairs             Wheelchair Mobility    Modified Rankin (Stroke Patients Only) Modified Rankin (Stroke Patients Only) Pre-Morbid Rankin Score: No symptoms Modified Rankin: Severe disability     Balance Overall balance assessment: Needs assistance Sitting-balance support: Feet supported Sitting balance-Leahy Scale: Poor Sitting balance - Comments: sat 5 min working on eliciting truncal responses                                    Cognition Arousal/Alertness: Lethargic Behavior During Therapy: Flat affect Overall Cognitive Status: Impaired/Different from baseline                                 General  Comments: pt had eyes open 50% of time.      Exercises Other Exercises Other Exercises: PROM at Bil ankles/heel cords, knees, hips, hams, trunk rotation, hands/wrist/fingers, elbows, forearm sup/pron, shd's/scapula and neck.    General Comments General comments (skin integrity, edema, etc.): VSS on 28% TC      Pertinent Vitals/Pain Pain Assessment: Faces Faces Pain Scale: Hurts a little bit Pain Location: vague UE ROM pain Pain Descriptors / Indicators: Grimacing Pain Intervention(s): Monitored during session    Home Living                      Prior Function            PT Goals (current goals can now be found in the care plan section) Acute Rehab PT Goals Patient Stated Goal: didn't state PT Goal Formulation: Patient unable to participate in goal setting Time For Goal Achievement: 02/04/19 Potential to Achieve Goals: Fair Progress towards PT goals: Progressing toward goals    Frequency    Min 2X/week      PT Plan Current plan remains appropriate    Co-evaluation  AM-PAC PT "6 Clicks" Mobility   Outcome Measure  Help needed turning from your back to your side while in a flat bed without using bedrails?: Total Help needed moving from lying on your back to sitting on the side of a flat bed without using bedrails?: Total Help needed moving to and from a bed to a chair (including a wheelchair)?: Total Help needed standing up from a chair using your arms (e.g., wheelchair or bedside chair)?: Total Help needed to walk in hospital room?: Total Help needed climbing 3-5 steps with a railing? : Total 6 Click Score: 6    End of Session Equipment Utilized During Treatment: Oxygen Activity Tolerance: Patient tolerated treatment well Patient left: in bed;with call bell/phone within reach Nurse Communication: Mobility status PT Visit Diagnosis: Muscle weakness (generalized) (M62.81);Other symptoms and signs involving the nervous system  (R29.898);Hemiplegia and hemiparesis Hemiplegia - Right/Left: Left Hemiplegia - caused by: Cerebral infarction     Time: 6213-0865 PT Time Calculation (min) (ACUTE ONLY): 20 min  Charges:  $Therapeutic Activity: 8-22 mins                     01/31/2019  [place] Bing, PT Acute Rehabilitation Services [phone]  (pager) [phone]  (office)   Eliseo Gum Lasonya Hubner 01/31/2019, 5:20 PM

## 2019-01-31 NOTE — Plan of Care (Signed)
  Problem: Education: Goal: Knowledge of General Education information will improve Description Including pain rating scale, medication(s)/side effects and non-pharmacologic comfort measures Outcome: Not Progressing   Problem: Health Behavior/Discharge Planning: Goal: Ability to manage health-related needs will improve Outcome: Not Progressing   Problem: Clinical Measurements: Goal: Ability to maintain clinical measurements within normal limits will improve Outcome: Not Progressing Goal: Will remain free from infection Outcome: Not Progressing Goal: Diagnostic test results will improve Outcome: Not Progressing Goal: Respiratory complications will improve Outcome: Not Progressing Goal: Cardiovascular complication will be avoided Outcome: Not Progressing   Problem: Activity: Goal: Risk for activity intolerance will decrease Outcome: Not Progressing   Problem: Nutrition: Goal: Adequate nutrition will be maintained Outcome: Not Progressing   Problem: Coping: Goal: Level of anxiety will decrease Outcome: Not Progressing   Problem: Elimination: Goal: Will not experience complications related to bowel motility Outcome: Not Progressing Goal: Will not experience complications related to urinary retention Outcome: Not Progressing   Problem: Pain Managment: Goal: General experience of comfort will improve Outcome: Not Progressing   Problem: Safety: Goal: Ability to remain free from injury will improve Outcome: Not Progressing   Problem: Skin Integrity: Goal: Risk for impaired skin integrity will decrease Outcome: Not Progressing   Problem: Activity: Goal: Ability to tolerate increased activity will improve Outcome: Not Progressing   Problem: Respiratory: Goal: Ability to maintain a clear airway and adequate ventilation will improve Outcome: Not Progressing   Problem: Role Relationship: Goal: Method of communication will improve Outcome: Not Progressing   Problem:  Education: Goal: Expressions of having a comfortable level of knowledge regarding the disease process will increase Outcome: Not Progressing   Problem: Coping: Goal: Ability to adjust to condition or change in health will improve Outcome: Not Progressing Goal: Ability to identify appropriate support needs will improve Outcome: Not Progressing   Problem: Health Behavior/Discharge Planning: Goal: Compliance with prescribed medication regimen will improve Outcome: Not Progressing   Problem: Medication: Goal: Risk for medication side effects will decrease Outcome: Not Progressing   Problem: Clinical Measurements: Goal: Complications related to the disease process, condition or treatment will be avoided or minimized Outcome: Not Progressing Goal: Diagnostic test results will improve Outcome: Not Progressing   Problem: Safety: Goal: Verbalization of understanding the information provided will improve Outcome: Not Progressing   Problem: Self-Concept: Goal: Level of anxiety will decrease Outcome: Not Progressing Goal: Ability to verbalize feelings about condition will improve Outcome: Not Progressing   

## 2019-01-31 NOTE — TOC Transition Note (Signed)
Transition of Care [facility]) - CM/SW Discharge Note   Patient Details  Name: [name] MRN: [id number] Date of Birth: 20-Jul-1951  Transition of Care [facility]) CM/SW Contact:  Doy Hutching, LCSWA Phone Number: 01/31/2019, 2:52 PM   Clinical Narrative:    Pt approved for LTAC- d/c to [facility] as arranged by [facility].   Final next level of care: Long Term Acute Care (LTAC) Barriers to Discharge: Barriers Resolved   Patient Goals and CMS Choice     Choice offered to / list presented to : Adult Children  Discharge Placement  [facility]. Plan and Services     Post Acute Care Choice: Skilled Nursing Facility                    Social Determinants of Health (SDOH) Interventions     Readmission Risk Interventions No flowsheet data found.

## 2019-01-31 NOTE — Progress Notes (Signed)
MEDICATION RELATED CONSULT NOTE  Pharmacy Consult for Phenytoin Indication: seizure-like activity   Assessment: 68 year old female admitted 3/5 unresponsive with AMS. EEG (preliminary report) was positive for seizure activity so patient initiated on fosphenytoin load followed by phenytoin maintenance dosing. Patient now s/p burst suppression.  In addition to phenytoin, patient has was also started on topiramate on 3/25. There is a noted drug interaction between the two - phenytoin can decrease serum concentrations of topiramate and topiramate can increase serum concentrations of phenytoin.   Topiramate 50 mg BID x 1 week > increased to 75 mg BID x 1 week on 4/1; next increase to 100 mg BID planned for 4/8.    Phenytoin decreased from 100 mg-75 mg-75 mg q8h to 75 mg q8h on 3/30, after last level 10.9 (corrected to ~21.8 with albumin 2.0).   Phenytoin level this AM 11.1; corrects to ~20.6 (supratherapeutic) with albumin of 2.2. Since corrected phenytoin level is above goal of 10-20 and topiramate is being up-titrated, will decrease phenytoin dose.   Plan:    Decrease phenytoin from 75 mg q8h to 75 mg-50 mg-75 mg per tube q8h.   Target phenytoin level 10-20 mcg/ml   Next phenytoin level on 4/10 with Topamax level, in one week.   Next Topamax up-titration scheduled for 4/8.  Per Neuro plan 3/24, Keppra wean to begin at that time. To decrease Keppra 500 mg/dose every 3-5 days to off.  And if Topamax level is low, will need further up-titration.   Continues on Keppra 1500 mg BID, Vimpat 200 mg per tube BID and Amantadine 100 mg per tube daily.   Monitor for side effects.   Dennie Fetters, [place] Pager: [phone] or phone: [phone] 01/31/2019     9:49 AM

## 2019-01-31 NOTE — Care Management (Signed)
Notified MD of approval for LTAC hospital by insurance, and bed availability at [facility] today.  MD agreeable to dc to LTAC today; will complete discharge summary.    Pt will discharge to 5E-20 Dr. Sharyon Medicus is receiving MD. Please call report to [phone].  Quintella Baton, RN, BSN  Trauma/Neuro ICU Case Manager [phone]

## 2019-02-01 ENCOUNTER — Other Ambulatory Visit (HOSPITAL_COMMUNITY): Payer: Medicare HMO

## 2019-02-01 LAB — CBC WITH DIFFERENTIAL/PLATELET
Abs Immature Granulocytes: 0.04 10*3/uL (ref 0.00–0.07)
Basophils Absolute: 0 10*3/uL (ref 0.0–0.1)
Basophils Relative: 0 %
Eosinophils Absolute: 0.1 10*3/uL (ref 0.0–0.5)
Eosinophils Relative: 3 %
HCT: 38.8 % (ref 36.0–46.0)
Hemoglobin: 12.5 g/dL (ref 12.0–15.0)
Immature Granulocytes: 1 %
Lymphocytes Relative: 23 %
Lymphs Abs: 1.2 10*3/uL (ref 0.7–4.0)
MCH: 29.2 pg (ref 26.0–34.0)
MCHC: 32.2 g/dL (ref 30.0–36.0)
MCV: 90.7 fL (ref 80.0–100.0)
Monocytes Absolute: 0.8 10*3/uL (ref 0.1–1.0)
Monocytes Relative: 15 %
Neutro Abs: 3.1 10*3/uL (ref 1.7–7.7)
Neutrophils Relative %: 58 %
Platelets: 352 10*3/uL (ref 150–400)
RBC: 4.28 MIL/uL (ref 3.87–5.11)
RDW: 15.4 % (ref 11.5–15.5)
WBC: 5.3 10*3/uL (ref 4.0–10.5)
nRBC: 0 % (ref 0.0–0.2)

## 2019-02-01 LAB — COMPREHENSIVE METABOLIC PANEL
ALT: 121 U/L — ABNORMAL HIGH (ref 0–44)
AST: 58 U/L — ABNORMAL HIGH (ref 15–41)
Albumin: 2.5 g/dL — ABNORMAL LOW (ref 3.5–5.0)
Alkaline Phosphatase: 120 U/L (ref 38–126)
Anion gap: 12 (ref 5–15)
BUN: 18 mg/dL (ref 8–23)
CO2: 23 mmol/L (ref 22–32)
Calcium: 10.3 mg/dL (ref 8.9–10.3)
Chloride: 104 mmol/L (ref 98–111)
Creatinine, Ser: 0.68 mg/dL (ref 0.44–1.00)
GFR calc Af Amer: >60 mL/min (ref >60)
GFR calc non Af Amer: >60 mL/min (ref >60)
Glucose, Bld: 162 mg/dL — ABNORMAL HIGH (ref 70–99)
Potassium: 3.7 mmol/L (ref 3.5–5.1)
Sodium: 139 mmol/L (ref 135–145)
Total Bilirubin: 0.2 mg/dL — ABNORMAL LOW (ref 0.3–1.2)
Total Protein: 7.7 g/dL (ref 6.5–8.1)

## 2019-02-01 LAB — C DIFFICILE QUICK SCREEN W PCR REFLEX
C Diff antigen: NEGATIVE
C Diff interpretation: NOT DETECTED
C Diff toxin: NEGATIVE

## 2019-02-02 LAB — BASIC METABOLIC PANEL
Anion gap: 10 (ref 5–15)
BUN: 20 mg/dL (ref 8–23)
CO2: 23 mmol/L (ref 22–32)
Calcium: 10.1 mg/dL (ref 8.9–10.3)
Chloride: 105 mmol/L (ref 98–111)
Creatinine, Ser: 0.66 mg/dL (ref 0.44–1.00)
GFR calc Af Amer: >60 mL/min (ref >60)
GFR calc non Af Amer: >60 mL/min (ref >60)
Glucose, Bld: 125 mg/dL — ABNORMAL HIGH (ref 70–99)
Potassium: 5.3 mmol/L — ABNORMAL HIGH (ref 3.5–5.1)
Sodium: 138 mmol/L (ref 135–145)

## 2019-02-02 LAB — HEMOGLOBIN A1C
Hgb A1c MFr Bld: 6.1 % — ABNORMAL HIGH (ref 4.8–5.6)
Mean Plasma Glucose: 128.37 mg/dL

## 2019-02-02 LAB — CBC
HCT: 40.2 % (ref 36.0–46.0)
Hemoglobin: 12.6 g/dL (ref 12.0–15.0)
MCH: 28.3 pg (ref 26.0–34.0)
MCHC: 31.3 g/dL (ref 30.0–36.0)
MCV: 90.3 fL (ref 80.0–100.0)
Platelets: 347 10*3/uL (ref 150–400)
RBC: 4.45 MIL/uL (ref 3.87–5.11)
RDW: 15.4 % (ref 11.5–15.5)
WBC: 3.8 10*3/uL — ABNORMAL LOW (ref 4.0–10.5)
nRBC: 0 % (ref 0.0–0.2)

## 2019-02-02 LAB — TSH: TSH: 1.691 u[IU]/mL (ref 0.350–4.500)

## 2019-02-02 LAB — PHOSPHORUS: Phosphorus: 3.5 mg/dL (ref 2.5–4.6)

## 2019-02-02 LAB — MAGNESIUM: Magnesium: 1.9 mg/dL (ref 1.7–2.4)

## 2019-02-03 ENCOUNTER — Other Ambulatory Visit (HOSPITAL_COMMUNITY): Payer: Medicare HMO

## 2019-02-03 LAB — POTASSIUM: Potassium: 4.4 mmol/L (ref 3.5–5.1)

## 2019-02-05 DIAGNOSIS — G40301 Generalized idiopathic epilepsy and epileptic syndromes, not intractable, with status epilepticus: Secondary | ICD-10-CM

## 2019-02-05 DIAGNOSIS — J9621 Acute and chronic respiratory failure with hypoxia: Secondary | ICD-10-CM

## 2019-02-05 DIAGNOSIS — J690 Pneumonitis due to inhalation of food and vomit: Secondary | ICD-10-CM

## 2019-02-05 DIAGNOSIS — G931 Anoxic brain damage, not elsewhere classified: Secondary | ICD-10-CM

## 2019-02-05 NOTE — Consult Note (Signed)
Pulmonary Critical Care Medicine [facility] [place]  PULMONARY SERVICE  Date of Service: 02/05/2019  PULMONARY CRITICAL CARE CONSULT   Erika Wang  AJG:[id number]  DOB: 1951-06-07   DOA: 01/31/2019  Referring Physician: Carron Curie, MD  HPI: Erika Wang is a 68 y.o. female seen for follow up of Acute on Chronic Respiratory Failure.  Patient was admitted after having been found on the floor by the family.  The daughter had apparently noted that she had been slightly off balance but otherwise was her usual state of health.  No complaints of fever headache nausea vomiting diarrhea.  Daughter came in to check on her and found her on the floor.  Patient was found to have left-sided weakness and was unresponsive.  Neurology was asked to see the patient EEG was done and CT scan did not show any acute changes.  The EEG showed status epilepticus.  Patient was started on antiepileptic medications and intubated.  Patient subsequently was not able to be weaned and extubated and ended up having to have a tracheostomy.  Patient was extubated had been off the ventilator and ended up aspirating and then was reintubated and then had a tracheostomy done.  Transferred to our facility for further management  Review of Systems:  ROS performed and is unremarkable other than noted above.  Past Medical History:  Diagnosis Date  . High cholesterol   . Hypertension   . Stroke [facility])     Past Surgical History:  Procedure Laterality Date  . CESAREAN SECTION      Social History:    reports that she quit smoking about 5 years ago. She has never used smokeless tobacco. She reports that she does not drink alcohol or use drugs.  Family History: Non-Contributory to the present illness  Allergies  Allergen Reactions  . Ace Inhibitors Other (See Comments)    Acute renal failure    Medications: Reviewed on Rounds  Physical Exam:  Vitals: Temperature 97.7 pulse 77 respiratory 18 blood pressure  149/88  Ventilator Settings currently is off the ventilator on T collar 28% FiO2  . General: Comfortable at this time . Eyes: Grossly normal lids, irises & conjunctiva . ENT: grossly tongue is normal . Neck: no obvious mass . Cardiovascular: S1-S2 normal no gallop or rub is noted at this time . Respiratory: No rhonchi or rales are needed at this time . Abdomen: Soft nontender . Skin: no rash seen on limited exam . Musculoskeletal: not rigid . Psychiatric:unable to assess . Neurologic: no seizure no involuntary movements         Labs on Admission:  Basic Metabolic Panel: Recent Labs  Lab 01/30/19 1000 01/31/19 0603 02/01/19 0408 02/02/19 0708 02/03/19 0501  NA 137 138 139 138  --   K 4.0 4.1 3.7 5.3* 4.4  CL 102 103 104 105  --   CO2 26 23 23 23   --   GLUCOSE 147* 131* 162* 125*  --   BUN 13 16 18 20   --   CREATININE 0.69 0.71 0.68 0.66  --   CALCIUM 9.6 9.6 10.3 10.1  --   MG 1.9  --   --  1.9  --   PHOS  --   --   --  3.5  --     No results for input(s): PHART, PCO2ART, PO2ART, HCO3, O2SAT in the last 168 hours.  Liver Function Tests: Recent Labs  Lab 01/31/19 0603 02/01/19 0408  AST  --  58*  ALT  --  121*  ALKPHOS  --  120  BILITOT  --  0.2*  PROT  --  7.7  ALBUMIN 2.2* 2.5*   No results for input(s): LIPASE, AMYLASE in the last 168 hours. No results for input(s): AMMONIA in the last 168 hours.  CBC: Recent Labs  Lab 01/30/19 1000 02/01/19 0408 02/02/19 0708  WBC 4.5 5.3 3.8*  NEUTROABS  --  3.1  --   HGB 10.7* 12.5 12.6  HCT 33.7* 38.8 40.2  MCV 91.8 90.7 90.3  PLT 300 352 347    Cardiac Enzymes: No results for input(s): CKTOTAL, CKMB, CKMBINDEX, TROPONINI in the last 168 hours.  BNP (last 3 results) No results for input(s): BNP in the last 8760 hours.  ProBNP (last 3 results) No results for input(s): PROBNP in the last 8760 hours.   Radiological Exams on Admission: Ct Abdomen Wo Contrast  Result Date: 02/03/2019 CLINICAL DATA:   Urinary tract infection. EXAM: CT ABDOMEN AND PELVIS WITHOUT CONTRAST TECHNIQUE: Multidetector CT imaging of the abdomen and pelvis was performed following the standard protocol without IV contrast. COMPARISON:  None. FINDINGS: Lower chest: Dependent opacity noted in both lower lobes and the left upper lobe lingula, consistent with atelectasis. Hepatobiliary: 8 mm low-density lesion in segment 6 likely a cyst or hemangioma. No other liver abnormality. Normal gallbladder. No bile duct dilation. Pancreas: Unremarkable. No pancreatic ductal dilatation or surrounding inflammatory changes. Spleen: Normal in size without focal abnormality. Adrenals/Urinary Tract: No adrenal masses. Kidneys normal in size, orientation and position. 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 Small parenchymal calcifications in the lower pole the left kidney. No other renal masses or lesions. Mild dilation of the right intrarenal collecting system. Nondilated left intrarenal collecting system. Mild prominence of portions of the right ureter. Left ureter unremarkable. No ureteral stones. Bladder is decompressed with a Foley catheter. Stomach/Bowel: Nasogastric tube passes through the stomach, tip projecting in the duodenal bulb. Stomach otherwise unremarkable. Small bowel and colon are normal in caliber. No wall thickening or inflammation. There are multiple left colon diverticula. No diverticulitis. Normal appendix visualized. Vascular/Lymphatic: Diffuse aortic atherosclerosis. No aneurysm. No enlarged lymph nodes. Reproductive: Uterus and bilateral adnexa are unremarkable. Other: No abdominal wall hernia or abnormality. No abdominopelvic ascites. Musculoskeletal: Mild to moderate compression deformity of L1, which appears old. No other fractures. No osteoblastic or osteolytic lesions. IMPRESSION: 1. Mild dilation of the right intrarenal collecting system with  prominence of portions of the right ureter, but no ureteral stones. Findings are consistent with pyelonephritis in the proper clinical setting. Findings could reflect a recently passed right ureteral stone. 2. No other evidence of an acute abnormality within the abdomen or pelvis. 3. Atelectasis at the lung bases. 4. Colonic diverticula without diverticulitis. 5. Aortic atherosclerosis. Electronically Signed   By: Amie Portlandavid  Ormond M.D.   On: 02/03/2019 14:14   Ct Pelvis Wo Contrast  Result Date: 02/03/2019 CLINICAL DATA:  Urinary tract infection. EXAM: CT ABDOMEN AND PELVIS WITHOUT CONTRAST TECHNIQUE: Multidetector CT imaging of the abdomen and pelvis was performed following the standard protocol without IV contrast. COMPARISON:  None. FINDINGS: Lower chest: Dependent opacity noted in both lower lobes and the left upper lobe lingula, consistent with atelectasis. Hepatobiliary: 8 mm low-density lesion in segment 6 likely a cyst or hemangioma. No other liver abnormality. Normal gallbladder. No bile duct dilation. Pancreas: Unremarkable. No pancreatic ductal dilatation or surrounding inflammatory changes. Spleen: Normal in size without  focal abnormality. Adrenals/Urinary Tract: No adrenal masses. Kidneys normal in size, orientation and position. 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 Small parenchymal calcifications in the lower pole the left kidney. No other renal masses or lesions. Mild dilation of the right intrarenal collecting system. Nondilated left intrarenal collecting system. Mild prominence of portions of the right ureter. Left ureter unremarkable. No ureteral stones. Bladder is decompressed with a Foley catheter. Stomach/Bowel: Nasogastric tube passes through the stomach, tip projecting in the duodenal bulb. Stomach otherwise unremarkable. Small bowel and colon are normal in caliber. No wall thickening or inflammation. There  are multiple left colon diverticula. No diverticulitis. Normal appendix visualized. Vascular/Lymphatic: Diffuse aortic atherosclerosis. No aneurysm. No enlarged lymph nodes. Reproductive: Uterus and bilateral adnexa are unremarkable. Other: No abdominal wall hernia or abnormality. No abdominopelvic ascites. Musculoskeletal: Mild to moderate compression deformity of L1, which appears old. No other fractures. No osteoblastic or osteolytic lesions. IMPRESSION: 1. Mild dilation of the right intrarenal collecting system with prominence of portions of the right ureter, but no ureteral stones. Findings are consistent with pyelonephritis in the proper clinical setting. Findings could reflect a recently passed right ureteral stone. 2. No other evidence of an acute abnormality within the abdomen or pelvis. 3. Atelectasis at the lung bases. 4. Colonic diverticula without diverticulitis. 5. Aortic atherosclerosis. Electronically Signed   By: Amie Portland M.D.   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 Acute on chronic respiratory failure with hypoxia we will continue with T collar secretions are still significant.  Neurology did not feel that the patient did have a meaningful recovery.  Right now remains nonverbal. 2. Status epilepticus patient was on multiple antiseizure medications which will be continued.  Has had very respiratory status.  Status epilepticus has been difficult to control based on the notes from the ICU 3. Aspiration pneumonia treated with antibiotics completed Zosyn as well as Unasyn. 4. Anoxic encephalopathy poor prognosis  I have personally seen and evaluated the patient, evaluated laboratory and imaging results, formulated the assessment and plan and placed orders. The  Patient requires high complexity decision making for assessment and support.  Case was discussed on Rounds with the Respiratory Therapy Staff Time Spent  Yevonne Pax, MD [facility] Pulmonary Critical Care Medicine Sleep Medicine

## 2019-02-06 ENCOUNTER — Encounter: Payer: Self-pay | Admitting: Internal Medicine

## 2019-02-06 DIAGNOSIS — G40301 Generalized idiopathic epilepsy and epileptic syndromes, not intractable, with status epilepticus: Secondary | ICD-10-CM

## 2019-02-06 DIAGNOSIS — G40911 Epilepsy, unspecified, intractable, with status epilepticus: Secondary | ICD-10-CM | POA: Diagnosis present

## 2019-02-06 DIAGNOSIS — G931 Anoxic brain damage, not elsewhere classified: Secondary | ICD-10-CM | POA: Diagnosis present

## 2019-02-06 DIAGNOSIS — J9621 Acute and chronic respiratory failure with hypoxia: Secondary | ICD-10-CM | POA: Diagnosis present

## 2019-02-06 DIAGNOSIS — J690 Pneumonitis due to inhalation of food and vomit: Secondary | ICD-10-CM | POA: Diagnosis present

## 2019-02-06 LAB — COMPREHENSIVE METABOLIC PANEL
ALT: 51 U/L — ABNORMAL HIGH (ref 0–44)
AST: 27 U/L (ref 15–41)
Albumin: 2.5 g/dL — ABNORMAL LOW (ref 3.5–5.0)
Alkaline Phosphatase: 131 U/L — ABNORMAL HIGH (ref 38–126)
Anion gap: 15 (ref 5–15)
BUN: 18 mg/dL (ref 8–23)
CO2: 23 mmol/L (ref 22–32)
Calcium: 10.1 mg/dL (ref 8.9–10.3)
Chloride: 100 mmol/L (ref 98–111)
Creatinine, Ser: 0.74 mg/dL (ref 0.44–1.00)
GFR calc Af Amer: >60 mL/min (ref >60)
GFR calc non Af Amer: >60 mL/min (ref >60)
Glucose, Bld: 119 mg/dL — ABNORMAL HIGH (ref 70–99)
Potassium: 4.3 mmol/L (ref 3.5–5.1)
Sodium: 138 mmol/L (ref 135–145)
Total Bilirubin: 0.4 mg/dL (ref 0.3–1.2)
Total Protein: 7.1 g/dL (ref 6.5–8.1)

## 2019-02-06 LAB — CBC
HCT: 35.4 % — ABNORMAL LOW (ref 36.0–46.0)
Hemoglobin: 11.6 g/dL — ABNORMAL LOW (ref 12.0–15.0)
MCH: 29.5 pg (ref 26.0–34.0)
MCHC: 32.8 g/dL (ref 30.0–36.0)
MCV: 90.1 fL (ref 80.0–100.0)
Platelets: 341 10*3/uL (ref 150–400)
RBC: 3.93 MIL/uL (ref 3.87–5.11)
RDW: 15.4 % (ref 11.5–15.5)
WBC: 5.4 10*3/uL (ref 4.0–10.5)
nRBC: 0 % (ref 0.0–0.2)

## 2019-02-06 LAB — MAGNESIUM: Magnesium: 1.9 mg/dL (ref 1.7–2.4)

## 2019-02-06 NOTE — Progress Notes (Addendum)
Pulmonary Critical Care Medicine [facility] [place]   PULMONARY CRITICAL CARE SERVICE  PROGRESS NOTE  Date of Service: 02/06/2019  Teri Kilman  HCW:[id number]  DOB: 1951-04-29   DOA: 01/31/2019  Referring Physician: Carron Curie, MD  HPI: Stormie Sheasley is a 68 y.o. female seen for follow up of Acute on Chronic Respiratory Failure.  Patient remains on Trach Collar 21% FiO2 at This Time.  Patient Using PMV without Difficulty No Acute Distress Noted.  Medications: Reviewed on Rounds  Physical Exam:  Vitals: Pulse 77 respirations 18 BP 149/88 O2 sat 98% temp 97.7  Ventilator Settings not currently on ventilator  . General: Comfortable at this time . Eyes: Grossly normal lids, irises & conjunctiva . ENT: grossly tongue is normal . Neck: no obvious mass . Cardiovascular: S1 S2 normal no gallop . Respiratory: No rales or rhonchi noted . Abdomen: soft . Skin: no rash seen on limited exam . Musculoskeletal: not rigid . Psychiatric:unable to assess . Neurologic: no seizure no involuntary movements         Lab Data:   Basic Metabolic Panel: Recent Labs  Lab 01/31/19 0603 02/01/19 0408 02/02/19 0708 02/03/19 0501 02/06/19 0643  NA 138 139 138  --  138  K 4.1 3.7 5.3* 4.4 4.3  CL 103 104 105  --  100  CO2 23 23 23   --  23  GLUCOSE 131* 162* 125*  --  119*  BUN 16 18 20   --  18  CREATININE 0.71 0.68 0.66  --  0.74  CALCIUM 9.6 10.3 10.1  --  10.1  MG  --   --  1.9  --  1.9  PHOS  --   --  3.5  --   --     ABG: No results for input(s): PHART, PCO2ART, PO2ART, HCO3, O2SAT in the last 168 hours.  Liver Function Tests: Recent Labs  Lab 01/31/19 0603 02/01/19 0408 02/06/19 0643  AST  --  58* 27  ALT  --  121* 51*  ALKPHOS  --  120 131*  BILITOT  --  0.2* 0.4  PROT  --  7.7 7.1  ALBUMIN 2.2* 2.5* 2.5*   No results for input(s): LIPASE, AMYLASE in the last 168 hours. No results for input(s): AMMONIA in the last 168 hours.  CBC: Recent Labs  Lab  02/01/19 0408 02/02/19 0708 02/06/19 0643  WBC 5.3 3.8* 5.4  NEUTROABS 3.1  --   --   HGB 12.5 12.6 11.6*  HCT 38.8 40.2 35.4*  MCV 90.7 90.3 90.1  PLT 352 347 341    Cardiac Enzymes: No results for input(s): CKTOTAL, CKMB, CKMBINDEX, TROPONINI in the last 168 hours.  BNP (last 3 results) No results for input(s): BNP in the last 8760 hours.  ProBNP (last 3 results) No results for input(s): PROBNP in the last 8760 hours.  Radiological Exams: No results found.  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 Acute on chronic respiratory failure with hypoxia continue with T collar trials.  Patient remains nonverbal with large secretions.  Continue pulmonary: Secretion management 2. Status epilepticus patient is on multiple medications.  Controlled at this time.  Continue current therapy. 3. Aspiration pneumonia treated continue to follow 4. Anoxic encephalopathy poor prognosis   I have personally seen and evaluated the patient,  evaluated laboratory and imaging results, formulated the assessment and plan and placed orders. The Patient requires high complexity decision making for assessment and support.  Case was discussed on Rounds with the Respiratory Therapy Staff  Allyne Gee, MD [facility] Pulmonary Critical Care Medicine Sleep Medicine

## 2019-02-07 NOTE — Progress Notes (Addendum)
Pulmonary Critical Care Medicine [facility] [place]   PULMONARY CRITICAL CARE SERVICE  PROGRESS NOTE  Date of Service: 02/07/2019  Erika Wang  FXO:[id number]  DOB: Apr 26, 1951   DOA: 01/31/2019  Referring Physician: Carron Curie, MD  HPI: Erika Wang is a 68 y.o. female seen for follow up of Acute on Chronic Respiratory Failure.  Patient continues on trach collar 21% FiO2 at this time.  Overall is doing well no distress noted.  Medications: Reviewed on Rounds  Physical Exam:  Vitals: Pulse 87 respiration 18 BP 164/81 O2 sat 97% temp 97.8  Ventilator Settings not only on ventilator  . General: Comfortable at this time . Eyes: Grossly normal lids, irises & conjunctiva . ENT: grossly tongue is normal . Neck: no obvious mass . Cardiovascular: S1 S2 normal no gallop . Respiratory: No rales or rhonchi noted . Abdomen: soft . Skin: no rash seen on limited exam . Musculoskeletal: not rigid . Psychiatric:unable to assess . Neurologic: no seizure no involuntary movements         Lab Data:   Basic Metabolic Panel: Recent Labs  Lab 02/01/19 0408 02/02/19 0708 02/03/19 0501 02/06/19 0643  NA 139 138  --  138  K 3.7 5.3* 4.4 4.3  CL 104 105  --  100  CO2 23 23  --  23  GLUCOSE 162* 125*  --  119*  BUN 18 20  --  18  CREATININE 0.68 0.66  --  0.74  CALCIUM 10.3 10.1  --  10.1  MG  --  1.9  --  1.9  PHOS  --  3.5  --   --     ABG: No results for input(s): PHART, PCO2ART, PO2ART, HCO3, O2SAT in the last 168 hours.  Liver Function Tests: Recent Labs  Lab 02/01/19 0408 02/06/19 0643  AST 58* 27  ALT 121* 51*  ALKPHOS 120 131*  BILITOT 0.2* 0.4  PROT 7.7 7.1  ALBUMIN 2.5* 2.5*   No results for input(s): LIPASE, AMYLASE in the last 168 hours. No results for input(s): AMMONIA in the last 168 hours.  CBC: Recent Labs  Lab 02/01/19 0408 02/02/19 0708 02/06/19 0643  WBC 5.3 3.8* 5.4  NEUTROABS 3.1  --   --   HGB 12.5 12.6 11.6*  HCT 38.8 40.2  35.4*  MCV 90.7 90.3 90.1  PLT 352 347 341    Cardiac Enzymes: No results for input(s): CKTOTAL, CKMB, CKMBINDEX, TROPONINI in the last 168 hours.  BNP (last 3 results) No results for input(s): BNP in the last 8760 hours.  ProBNP (last 3 results) No results for input(s): PROBNP in the last 8760 hours.  Radiological Exams: No results found.  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 Acute on chronic respiratory with hypoxia continue with T collar trials as patient can tolerate.  Continue pulmonary toilet and secretion management. 2. Status epilepticus patient continues on multiple occasions appears controlled at this time.  Continue present therapy 3. Aspiration pneumonia treated continue to follow 4. Anoxic encephalopathy prognosis remains poor.   I have personally seen and evaluated the patient, evaluated laboratory and imaging results, formulated the assessment and plan and placed orders. The Patient requires high complexity decision making for assessment and support.  Case was discussed on Rounds with the Respiratory Therapy Staff  [facility]  Richardson Dopp, MD [facility] Pulmonary Critical Care Medicine Sleep Medicine

## 2019-02-08 NOTE — Progress Notes (Addendum)
Pulmonary Critical Care Medicine [facility] [place]   PULMONARY CRITICAL CARE SERVICE  PROGRESS NOTE  Date of Service: 02/08/2019  [name]  TMA:[id number]  DOB: 08/01/1951   DOA: 01/31/2019  Referring Physician: Carron Curie, MD  HPI: Erika Wang is a 68 y.o. female seen for follow up of Acute on Chronic Respiratory Failure.  Patient's trach changed today to #6 cuffless.  Patient is doing well on aerosol trach collar 21% FiO2.  No distress at the time.  Medications: Reviewed on Rounds  Physical Exam:  Vitals: Pulse 86 respiration 18 BP 146/96 O2 sat 100% temp 97.3  Ventilator Settings aerosol trach collar 21% FiO2  . General: Comfortable at this time . Eyes: Grossly normal lids, irises & conjunctiva . ENT: grossly tongue is normal . Neck: no obvious mass . Cardiovascular: S1 S2 normal no gallop . Respiratory: No rales or rhonchi noted . Abdomen: soft . Skin: no rash seen on limited exam . Musculoskeletal: not rigid . Psychiatric:unable to assess . Neurologic: no seizure no involuntary movements         Lab Data:   Basic Metabolic Panel: Recent Labs  Lab 02/02/19 0708 02/03/19 0501 02/06/19 0643  NA 138  --  138  K 5.3* 4.4 4.3  CL 105  --  100  CO2 23  --  23  GLUCOSE 125*  --  119*  BUN 20  --  18  CREATININE 0.66  --  0.74  CALCIUM 10.1  --  10.1  MG 1.9  --  1.9  PHOS 3.5  --   --     ABG: No results for input(s): PHART, PCO2ART, PO2ART, HCO3, O2SAT in the last 168 hours.  Liver Function Tests: Recent Labs  Lab 02/06/19 0643  AST 27  ALT 51*  ALKPHOS 131*  BILITOT 0.4  PROT 7.1  ALBUMIN 2.5*   No results for input(s): LIPASE, AMYLASE in the last 168 hours. No results for input(s): AMMONIA in the last 168 hours.  CBC: Recent Labs  Lab 02/02/19 0708 02/06/19 0643  WBC 3.8* 5.4  HGB 12.6 11.6*  HCT 40.2 35.4*  MCV 90.3 90.1  PLT 347 341    Cardiac Enzymes: No results for input(s): CKTOTAL, CKMB, CKMBINDEX,  TROPONINI in the last 168 hours.  BNP (last 3 results) No results for input(s): BNP in the last 8760 hours.  ProBNP (last 3 results) No results for input(s): PROBNP in the last 8760 hours.  Radiological Exams: No results found.  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 Acute on chronic respiratory failure with hypoxia continue with T collar trials as patient can tolerate.  Continue pulmonary toilet and secretion management.  Change patient's trach to #6 cuffless today. 2. Status epilepticus, controlled at this time continue present therapy 3. Aspiration pneumonia treated continue to follow 4. Anoxic encephalopathy prognosis remains poor   I have personally seen and evaluated the patient, evaluated laboratory and imaging results, formulated the assessment and plan and placed orders. The Patient requires high complexity decision making for assessment and support.  Case was discussed on Rounds with the Respiratory Therapy Staff  Yevonne Pax, MD [facility] Pulmonary Critical Care Medicine Sleep Medicine

## 2019-02-08 NOTE — Progress Notes (Signed)
Patient ID: Erika Wang, female   DOB: Mar 04, 1951, 69 y.o.   MRN: [id number]   Request for percutaneous gastric tube in IR  Approved anatomy per CT -- Dr Deanne Coffer  DC date is scheduled for 4/24 We will plan for probable placement closer to DC date  This is considered a high risk aerosolized procedure in IR Under new COVID regulation in IR these type of procedures must be approved and evaluated for necessity  This procedure has been approved per IR Rad Dr Deanne Coffer We will plan for placement 4/22- 4/23  MD aware

## 2019-02-09 NOTE — Progress Notes (Addendum)
Pulmonary Critical Care Medicine [facility] [place]   PULMONARY CRITICAL CARE SERVICE  PROGRESS NOTE  Date of Service: 02/09/2019  Erika Wang  TDS:[id number]  DOB: 11/28/1950   DOA: 01/31/2019  Referring Physician: Carron Curie, MD  HPI: Erika Wang is a 68 y.o. female seen for follow up of Acute on Chronic Respiratory Failure.  Patient continues on 21% aerosol trach collar.  She is using PMV today without difficulty.  She remains afebrile with no distress at this time.  Medications: Reviewed on Rounds  Physical Exam:  Vitals: Pulse 95 respirations 18 BP 153/89 O2 sat 91% temp 7.2  Ventilator Settings aerosol trach collar 21% FiO2  . General: Comfortable at this time . Eyes: Grossly normal lids, irises & conjunctiva . ENT: grossly tongue is normal . Neck: no obvious mass . Cardiovascular: S1 S2 normal no gallop . Respiratory: No rales or rhonchi noted . Abdomen: soft . Skin: no rash seen on limited exam . Musculoskeletal: not rigid . Psychiatric:unable to assess . Neurologic: no seizure no involuntary movements         Lab Data:   Basic Metabolic Panel: Recent Labs  Lab 02/03/19 0501 02/06/19 0643  NA  --  138  K 4.4 4.3  CL  --  100  CO2  --  23  GLUCOSE  --  119*  BUN  --  18  CREATININE  --  0.74  CALCIUM  --  10.1  MG  --  1.9    ABG: No results for input(s): PHART, PCO2ART, PO2ART, HCO3, O2SAT in the last 168 hours.  Liver Function Tests: Recent Labs  Lab 02/06/19 0643  AST 27  ALT 51*  ALKPHOS 131*  BILITOT 0.4  PROT 7.1  ALBUMIN 2.5*   No results for input(s): LIPASE, AMYLASE in the last 168 hours. No results for input(s): AMMONIA in the last 168 hours.  CBC: Recent Labs  Lab 02/06/19 0643  WBC 5.4  HGB 11.6*  HCT 35.4*  MCV 90.1  PLT 341    Cardiac Enzymes: No results for input(s): CKTOTAL, CKMB, CKMBINDEX, TROPONINI in the last 168 hours.  BNP (last 3 results) No results for input(s): BNP in the last 8760  hours.  ProBNP (last 3 results) No results for input(s): PROBNP in the last 8760 hours.  Radiological Exams: No results found.  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 Acute on chronic respiratory failure with hypoxia patient will continue with T collar as tolerated.  Continue pulmonary toilet and secretion management. 2. Status epilepticus, controlled at this time. 3. Aspiration pneumonia treated continue to follow 4. Anoxic encephalopathy prognosis remains poor   I have personally seen and evaluated the patient, evaluated laboratory and imaging results, formulated the assessment and plan and placed orders. The Patient requires high complexity decision making for assessment and support.  Case was discussed on Rounds with the Respiratory Therapy Staff  Yevonne Pax, MD [facility] Pulmonary Critical Care Medicine Sleep Medicine

## 2019-02-10 LAB — CBC
HCT: 40.4 % (ref 36.0–46.0)
Hemoglobin: 12.9 g/dL (ref 12.0–15.0)
MCH: 29 pg (ref 26.0–34.0)
MCHC: 31.9 g/dL (ref 30.0–36.0)
MCV: 90.8 fL (ref 80.0–100.0)
Platelets: 269 10*3/uL (ref 150–400)
RBC: 4.45 MIL/uL (ref 3.87–5.11)
RDW: 15.2 % (ref 11.5–15.5)
WBC: 5.1 10*3/uL (ref 4.0–10.5)
nRBC: 0 % (ref 0.0–0.2)

## 2019-02-10 LAB — COMPREHENSIVE METABOLIC PANEL
ALT: 38 U/L (ref 0–44)
AST: 21 U/L (ref 15–41)
Albumin: 2.6 g/dL — ABNORMAL LOW (ref 3.5–5.0)
Alkaline Phosphatase: 131 U/L — ABNORMAL HIGH (ref 38–126)
Anion gap: 17 — ABNORMAL HIGH (ref 5–15)
BUN: 17 mg/dL (ref 8–23)
CO2: 21 mmol/L — ABNORMAL LOW (ref 22–32)
Calcium: 10.9 mg/dL — ABNORMAL HIGH (ref 8.9–10.3)
Chloride: 100 mmol/L (ref 98–111)
Creatinine, Ser: 0.66 mg/dL (ref 0.44–1.00)
GFR calc Af Amer: >60 mL/min (ref >60)
GFR calc non Af Amer: >60 mL/min (ref >60)
Glucose, Bld: 123 mg/dL — ABNORMAL HIGH (ref 70–99)
Potassium: 4 mmol/L (ref 3.5–5.1)
Sodium: 138 mmol/L (ref 135–145)
Total Bilirubin: 0.6 mg/dL (ref 0.3–1.2)
Total Protein: 7.5 g/dL (ref 6.5–8.1)

## 2019-02-10 LAB — MAGNESIUM: Magnesium: 1.9 mg/dL (ref 1.7–2.4)

## 2019-02-10 NOTE — Progress Notes (Addendum)
Pulmonary Critical Care Medicine [facility] [place]   PULMONARY CRITICAL CARE SERVICE  PROGRESS NOTE  Date of Service: 02/10/2019  Erika Wang  GBM:[id number]  DOB: 1951/02/08   DOA: 01/31/2019  Referring Physician: Carron Curie, MD  HPI: Erika Wang is a 68 y.o. female seen for follow up of Acute on Chronic Respiratory Failure.  Patient remains on 21% aerosol trach collar with no distress at this time.  Medications: Reviewed on Rounds  Physical Exam:  Vitals: Pulse 90 respirations 20 BP 158/84 O2 sat 98% temp 96.7  Ventilator Settings aerosol trach collar 21% FiO2  . General: Comfortable at this time . Eyes: Grossly normal lids, irises & conjunctiva . ENT: grossly tongue is normal . Neck: no obvious mass . Cardiovascular: S1 S2 normal no gallop . Respiratory: No rales or rhonchi noted . Abdomen: soft . Skin: no rash seen on limited exam . Musculoskeletal: not rigid . Psychiatric:unable to assess . Neurologic: no seizure no involuntary movements         Lab Data:   Basic Metabolic Panel: Recent Labs  Lab 02/06/19 0643 02/10/19 0611  NA 138 138  K 4.3 4.0  CL 100 100  CO2 23 21*  GLUCOSE 119* 123*  BUN 18 17  CREATININE 0.74 0.66  CALCIUM 10.1 10.9*  MG 1.9 1.9    ABG: No results for input(s): PHART, PCO2ART, PO2ART, HCO3, O2SAT in the last 168 hours.  Liver Function Tests: Recent Labs  Lab 02/06/19 0643 02/10/19 0611  AST 27 21  ALT 51* 38  ALKPHOS 131* 131*  BILITOT 0.4 0.6  PROT 7.1 7.5  ALBUMIN 2.5* 2.6*   No results for input(s): LIPASE, AMYLASE in the last 168 hours. No results for input(s): AMMONIA in the last 168 hours.  CBC: Recent Labs  Lab 02/06/19 0643 02/10/19 0611  WBC 5.4 5.1  HGB 11.6* 12.9  HCT 35.4* 40.4  MCV 90.1 90.8  PLT 341 269    Cardiac Enzymes: No results for input(s): CKTOTAL, CKMB, CKMBINDEX, TROPONINI in the last 168 hours.  BNP (last 3 results) No results for input(s): BNP in the last  8760 hours.  ProBNP (last 3 results) No results for input(s): PROBNP in the last 8760 hours.  Radiological Exams: No results found.  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 Acute on chronic respiratory failure with hypoxia continue with T collar as tolerated.  Continue pulmonary toilet and secretion management 2. Status epilepticus controlled at this time 3. Aspiration pneumonia treated continue to follow 4. Anoxic encephalopathy prognosis remains poor   I have personally seen and evaluated the patient, evaluated laboratory and imaging results, formulated the assessment and plan and placed orders. The Patient requires high complexity decision making for assessment and support.  Case was discussed on Rounds with the Respiratory Therapy Staff  Yevonne Pax, MD [facility] Pulmonary Critical Care Medicine Sleep Medicine

## 2019-02-11 NOTE — Progress Notes (Addendum)
Pulmonary Critical Care Medicine [facility] [place]   PULMONARY CRITICAL CARE SERVICE  PROGRESS NOTE  Date of Service: 02/11/2019  Erika Wang  FGH:[id number]  DOB: 1951/01/05   DOA: 01/31/2019  Referring Physician: Carron Curie, MD  HPI: Erika Wang is a 68 y.o. female seen for follow up of Acute on Chronic Respiratory Failure.  Continues on 21% aerosol trach collar with no distress noted at this time.  Medications: Reviewed on Rounds  Physical Exam:  Vitals: Pulse 93 respirations 20 BP 38/94 O2 sat 90% temp 98.3  Ventilator Settings ATC 21% FiO2  . General: Comfortable at this time . Eyes: Grossly normal lids, irises & conjunctiva . ENT: grossly tongue is normal . Neck: no obvious mass . Cardiovascular: S1 S2 normal no gallop . Respiratory: No rales or rhonchi noted . Abdomen: soft . Skin: no rash seen on limited exam . Musculoskeletal: not rigid . Psychiatric:unable to assess . Neurologic: no seizure no involuntary movements         Lab Data:   Basic Metabolic Panel: Recent Labs  Lab 02/06/19 0643 02/10/19 0611  NA 138 138  K 4.3 4.0  CL 100 100  CO2 23 21*  GLUCOSE 119* 123*  BUN 18 17  CREATININE 0.74 0.66  CALCIUM 10.1 10.9*  MG 1.9 1.9    ABG: No results for input(s): PHART, PCO2ART, PO2ART, HCO3, O2SAT in the last 168 hours.  Liver Function Tests: Recent Labs  Lab 02/06/19 0643 02/10/19 0611  AST 27 21  ALT 51* 38  ALKPHOS 131* 131*  BILITOT 0.4 0.6  PROT 7.1 7.5  ALBUMIN 2.5* 2.6*   No results for input(s): LIPASE, AMYLASE in the last 168 hours. No results for input(s): AMMONIA in the last 168 hours.  CBC: Recent Labs  Lab 02/06/19 0643 02/10/19 0611  WBC 5.4 5.1  HGB 11.6* 12.9  HCT 35.4* 40.4  MCV 90.1 90.8  PLT 341 269    Cardiac Enzymes: No results for input(s): CKTOTAL, CKMB, CKMBINDEX, TROPONINI in the last 168 hours.  BNP (last 3 results) No results for input(s): BNP in the last 8760  hours.  ProBNP (last 3 results) No results for input(s): PROBNP in the last 8760 hours.  Radiological Exams: No results found.  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 Acute on chronic respiratory failure with hypoxia continue with T collar as tolerated.  Description management pulmonary toilet 2. Status epilepticus controlled at this time 3. Aspirational treated continue to follow 4. Anoxic encephalopathy prognosis remains poor   I have personally seen and evaluated the patient, evaluated laboratory and imaging results, formulated the assessment and plan and placed orders. The Patient requires high complexity decision making for assessment and support.  Case was discussed on Rounds with the Respiratory Therapy Staff  Yevonne Pax, MD [facility] Pulmonary Critical Care Medicine Sleep Medicine

## 2019-02-12 NOTE — Progress Notes (Addendum)
Pulmonary Critical Care Medicine [facility] [place]   PULMONARY CRITICAL CARE SERVICE  PROGRESS NOTE  Date of Service: 02/12/2019  Erika Wang  GQQ:[id number]  DOB: [birth year]   DOA: 01/31/2019  Referring Physician: Carron Curie, MD  HPI: Erika Wang is a 68 y.o. female seen for follow up of Acute on Chronic Respiratory Failure.  Patient remains on aerosol trach collar 21% FiO2.  Using PMV with no difficulty.  Medications: Reviewed on Rounds  Physical Exam:  Vitals: Pulse 90 respirations 18 BP 124/86 O2 sat 94% temp 98.2  Ventilator Settings ATC 21%  . General: Comfortable at this time . Eyes: Grossly normal lids, irises & conjunctiva . ENT: grossly tongue is normal . Neck: no obvious mass . Cardiovascular: S1 S2 normal no gallop . Respiratory: No rales or rhonchi noted . Abdomen: soft . Skin: no rash seen on limited exam . Musculoskeletal: not rigid . Psychiatric:unable to assess . Neurologic: no seizure no involuntary movements         Lab Data:   Basic Metabolic Panel: Recent Labs  Lab 02/06/19 0643 02/10/19 0611  NA 138 138  K 4.3 4.0  CL 100 100  CO2 23 21*  GLUCOSE 119* 123*  BUN 18 17  CREATININE 0.74 0.66  CALCIUM 10.1 10.9*  MG 1.9 1.9    ABG: No results for input(s): PHART, PCO2ART, PO2ART, HCO3, O2SAT in the last 168 hours.  Liver Function Tests: Recent Labs  Lab 02/06/19 0643 02/10/19 0611  AST 27 21  ALT 51* 38  ALKPHOS 131* 131*  BILITOT 0.4 0.6  PROT 7.1 7.5  ALBUMIN 2.5* 2.6*   No results for input(s): LIPASE, AMYLASE in the last 168 hours. No results for input(s): AMMONIA in the last 168 hours.  CBC: Recent Labs  Lab 02/06/19 0643 02/10/19 0611  WBC 5.4 5.1  HGB 11.6* 12.9  HCT 35.4* 40.4  MCV 90.1 90.8  PLT 341 269    Cardiac Enzymes: No results for input(s): CKTOTAL, CKMB, CKMBINDEX, TROPONINI in the last 168 hours.  BNP (last 3 results) No results for input(s): BNP in the last 8760  hours.  ProBNP (last 3 results) No results for input(s): PROBNP in the last 8760 hours.  Radiological Exams: No results found.  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 Acute on chronic respiratory failure with hypoxia continue with T collar as patient can tolerate.  Continue secretion management and pulmonary toilet 2. Status epilepticus controlled at this time 3. Aspiration pneumonia treated continue to follow 4. Anoxic encephalopathy prognosis remains poor   I have personally seen and evaluated the patient, evaluated laboratory and imaging results, formulated the assessment and plan and placed orders. The Patient requires high complexity decision making for assessment and support.  Case was discussed on Rounds with the Respiratory Therapy Staff  Yevonne Pax, MD [facility] Pulmonary Critical Care Medicine Sleep Medicine

## 2019-02-13 NOTE — Progress Notes (Addendum)
Pulmonary Critical Care Medicine [facility] [place]   PULMONARY CRITICAL CARE SERVICE  PROGRESS NOTE  Date of Service: 02/13/2019  Erika Wang  [id number]:[id number]  DOB: [birth year]   DOA: 01/31/2019  Referring Physician: Carron Curie, MD  HPI: Erika Wang is a 68 y.o. female seen for follow up of Acute on Chronic Respiratory Failure.  Patient continues on aerosol trach collar 21% FiO2.  Using PMV without difficulty.  No distress noted, patient is satting 100%.    Medications: Reviewed on Rounds  Physical Exam:  Vitals: Pulse 67 respirations 18 BP 135/75 O2 sat 100% temp 97.6  Ventilator Settings ATC 21%  . General: Comfortable at this time . Eyes: Grossly normal lids, irises & conjunctiva . ENT: grossly tongue is normal . Neck: no obvious mass . Cardiovascular: S1 S2 normal no gallop . Respiratory: No rales or rhonchi noted . Abdomen: soft . Skin: no rash seen on limited exam . Musculoskeletal: not rigid . Psychiatric:unable to assess . Neurologic: no seizure no involuntary movements         Lab Data:   Basic Metabolic Panel: Recent Labs  Lab 02/10/19 0611  NA 138  K 4.0  CL 100  CO2 21*  GLUCOSE 123*  BUN 17  CREATININE 0.66  CALCIUM 10.9*  MG 1.9    ABG: No results for input(s): PHART, PCO2ART, PO2ART, HCO3, O2SAT in the last 168 hours.  Liver Function Tests: Recent Labs  Lab 02/10/19 0611  AST 21  ALT 38  ALKPHOS 131*  BILITOT 0.6  PROT 7.5  ALBUMIN 2.6*   No results for input(s): LIPASE, AMYLASE in the last 168 hours. No results for input(s): AMMONIA in the last 168 hours.  CBC: Recent Labs  Lab 02/10/19 0611  WBC 5.1  HGB 12.9  HCT 40.4  MCV 90.8  PLT 269    Cardiac Enzymes: No results for input(s): CKTOTAL, CKMB, CKMBINDEX, TROPONINI in the last 168 hours.  BNP (last 3 results) No results for input(s): BNP in the last 8760 hours.  ProBNP (last 3 results) No results for input(s): PROBNP in the last 8760  hours.  Radiological Exams: No results found.  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 Acute on chronic respiratory failure with hypoxia continue with aerosol trach collar as patient can tolerate.  Continue pulmonary toilet and secretion management. 2. Status epilepticus controlled at this time 3. Aspiration pneumonia treated continue to follow 4. Anoxic encephalopathy prognosis remains poor   I have personally seen and evaluated the patient, evaluated laboratory and imaging results, formulated the assessment and plan and placed orders. The Patient requires high complexity decision making for assessment and support.  Case was discussed on Rounds with the Respiratory Therapy Staff  Yevonne Pax, MD [facility] Pulmonary Critical Care Medicine Sleep Medicine

## 2019-02-14 LAB — CBC
HCT: 32 % — ABNORMAL LOW (ref 36.0–46.0)
Hemoglobin: 10.6 g/dL — ABNORMAL LOW (ref 12.0–15.0)
MCH: 29.8 pg (ref 26.0–34.0)
MCHC: 33.1 g/dL (ref 30.0–36.0)
MCV: 89.9 fL (ref 80.0–100.0)
Platelets: 267 10*3/uL (ref 150–400)
RBC: 3.56 MIL/uL — ABNORMAL LOW (ref 3.87–5.11)
RDW: 15.1 % (ref 11.5–15.5)
WBC: 6.5 10*3/uL (ref 4.0–10.5)
nRBC: 0 % (ref 0.0–0.2)

## 2019-02-14 LAB — COMPREHENSIVE METABOLIC PANEL
ALT: 40 U/L (ref 0–44)
AST: 23 U/L (ref 15–41)
Albumin: 2.4 g/dL — ABNORMAL LOW (ref 3.5–5.0)
Alkaline Phosphatase: 138 U/L — ABNORMAL HIGH (ref 38–126)
Anion gap: 14 (ref 5–15)
BUN: 24 mg/dL — ABNORMAL HIGH (ref 8–23)
CO2: 23 mmol/L (ref 22–32)
Calcium: 10.1 mg/dL (ref 8.9–10.3)
Chloride: 100 mmol/L (ref 98–111)
Creatinine, Ser: 0.69 mg/dL (ref 0.44–1.00)
GFR calc Af Amer: >60 mL/min (ref >60)
GFR calc non Af Amer: >60 mL/min (ref >60)
Glucose, Bld: 140 mg/dL — ABNORMAL HIGH (ref 70–99)
Potassium: 3.7 mmol/L (ref 3.5–5.1)
Sodium: 137 mmol/L (ref 135–145)
Total Bilirubin: 0.2 mg/dL — ABNORMAL LOW (ref 0.3–1.2)
Total Protein: 7.2 g/dL (ref 6.5–8.1)

## 2019-02-14 LAB — PHOSPHORUS: Phosphorus: 3.7 mg/dL (ref 2.5–4.6)

## 2019-02-14 LAB — MAGNESIUM: Magnesium: 1.8 mg/dL (ref 1.7–2.4)

## 2019-02-14 NOTE — Progress Notes (Addendum)
Pulmonary Critical Care Medicine [facility] [place]   PULMONARY CRITICAL CARE SERVICE  PROGRESS NOTE  Date of Service: 02/14/2019  Erika Wang  [id number]  DOB: 1951/09/11   DOA: 01/31/2019  Referring Physician: Carron Curie, MD  HPI: Erika Wang is a 68 y.o. female seen for follow up of Acute on Chronic Respiratory Failure.  Patient is on 21% aerosol trach collar using PD with no distress at this time.  Medications: Reviewed on Rounds  Physical Exam:  Vitals: Pulse 83 respirations 26 BP 165/91 O2 sat 96% temp 97.4  Ventilator Settings ATC 21%  . General: Comfortable at this time . Eyes: Grossly normal lids, irises & conjunctiva . ENT: grossly tongue is normal . Neck: no obvious mass . Cardiovascular: S1 S2 normal no gallop . Respiratory: No rales or rhonchi noted . Abdomen: soft . Skin: no rash seen on limited exam . Musculoskeletal: not rigid . Psychiatric:unable to assess . Neurologic: no seizure no involuntary movements         Lab Data:   Basic Metabolic Panel: Recent Labs  Lab 02/10/19 0611 02/14/19 0531  NA 138 137  K 4.0 3.7  CL 100 100  CO2 21* 23  GLUCOSE 123* 140*  BUN 17 24*  CREATININE 0.66 0.69  CALCIUM 10.9* 10.1  MG 1.9 1.8  PHOS  --  3.7    ABG: No results for input(s): PHART, PCO2ART, PO2ART, HCO3, O2SAT in the last 168 hours.  Liver Function Tests: Recent Labs  Lab 02/10/19 0611 02/14/19 0531  AST 21 23  ALT 38 40  ALKPHOS 131* 138*  BILITOT 0.6 0.2*  PROT 7.5 7.2  ALBUMIN 2.6* 2.4*   No results for input(s): LIPASE, AMYLASE in the last 168 hours. No results for input(s): AMMONIA in the last 168 hours.  CBC: Recent Labs  Lab 02/10/19 0611 02/14/19 0531  WBC 5.1 6.5  HGB 12.9 10.6*  HCT 40.4 32.0*  MCV 90.8 89.9  PLT 269 267    Cardiac Enzymes: No results for input(s): CKTOTAL, CKMB, CKMBINDEX, TROPONINI in the last 168 hours.  BNP (last 3 results) No results for input(s): BNP in the last  8760 hours.  ProBNP (last 3 results) No results for input(s): PROBNP in the last 8760 hours.  Radiological Exams: No results found.  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 Acute on chronic respiratory failure with hypoxia continue with aerosol trach collar as patient can tolerate.  Continue secretion management pulmonary toilet and supportive measures 2. Status epilepticus controlled at this time 3. Aspiration pneumonia treated continue to follow 4. Anoxic encephalopathy prognosis remains poor   I have personally seen and evaluated the patient, evaluated laboratory and imaging results, formulated the assessment and plan and placed orders. The Patient requires high complexity decision making for assessment and support.  Case was discussed on Rounds with the Respiratory Therapy Staff  Yevonne Pax, MD [facility] Pulmonary Critical Care Medicine Sleep Medicine

## 2019-02-15 NOTE — Progress Notes (Addendum)
Pulmonary Critical Care Medicine [facility] [place]   PULMONARY CRITICAL CARE SERVICE  PROGRESS NOTE  Date of Service: 02/15/2019  [name]  TDD:[id number]  DOB: [birth year]   DOA: 01/31/2019  Referring Physician: Carron Curie, MD  HPI: Erika Wang is a 68 y.o. female seen for follow up of Acute on Chronic Respiratory Failure.  Patient remains on aerosol trach collar 21% FiO2 PMV without difficulty.  Will begin capping trials today.  Medications: Reviewed on Rounds  Physical Exam:  Vitals: Pulse 86 respiration 21 BP 135/76 O2 sat 99% to 98.1  Ventilator Settings 21% ATC  . General: Comfortable at this time . Eyes: Grossly normal lids, irises & conjunctiva . ENT: grossly tongue is normal . Neck: no obvious mass . Cardiovascular: S1 S2 normal no gallop . Respiratory: No rales or rhonchi noted . Abdomen: soft . Psychology  no rash seen on limited exam . Musculoskeletal: not rigid . Psychiatric:unable to assess . Neurologic: no seizure no involuntary movements         Lab Data:   Basic Metabolic Panel: Recent Labs  Lab 02/10/19 0611 02/14/19 0531  NA 138 137  K 4.0 3.7  CL 100 100  CO2 21* 23  GLUCOSE 123* 140*  BUN 17 24*  CREATININE 0.66 0.69  CALCIUM 10.9* 10.1  MG 1.9 1.8  PHOS  --  3.7    ABG: No results for input(s): PHART, PCO2ART, PO2ART, HCO3, O2SAT in the last 168 hours.  Liver Function Tests: Recent Labs  Lab 02/10/19 0611 02/14/19 0531  AST 21 23  ALT 38 40  ALKPHOS 131* 138*  BILITOT 0.6 0.2*  PROT 7.5 7.2  ALBUMIN 2.6* 2.4*   No results for input(s): LIPASE, AMYLASE in the last 168 hours. No results for input(s): AMMONIA in the last 168 hours.  CBC: Recent Labs  Lab 02/10/19 0611 02/14/19 0531  WBC 5.1 6.5  HGB 12.9 10.6*  HCT 40.4 32.0*  MCV 90.8 89.9  PLT 269 267    Cardiac Enzymes: No results for input(s): CKTOTAL, CKMB, CKMBINDEX, TROPONINI in the last 168 hours.  BNP (last 3 results) No results  for input(s): BNP in the last 8760 hours.  ProBNP (last 3 results) No results for input(s): PROBNP in the last 8760 hours.  Radiological Exams: No results found.  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 Acute on chronic respiratory failure with hypoxia continue with respiratory support as tolerated.  Continue pulmonary toilet and secretion management 2. Status epilepticus controlled 3. Aspiration pneumonia treated continue to follow 4. Anoxic encephalopathy prognosis remains poor   I have personally seen and evaluated the patient, evaluated laboratory and imaging results, formulated the assessment and plan and placed orders. The Patient requires high complexity decision making for assessment and support.  Case was discussed on Rounds with the Respiratory Therapy Staff  Yevonne Pax, MD [facility] Pulmonary Critical Care Medicine Sleep Medicine

## 2019-02-16 NOTE — Progress Notes (Addendum)
Pulmonary Critical Care Medicine [facility] [place]   PULMONARY CRITICAL CARE SERVICE  PROGRESS NOTE  Date of Service: 02/16/2019  [name]  TML:[id number]  DOB: [birth year]   DOA: 01/31/2019  Referring Physician: Carron Curie, MD  HPI: Erika Wang is a 68 y.o. female seen for follow up of Acute on Chronic Respiratory Failure.  Patient managed to wear For 13 hours yesterday.  Was placed back on aerosol trach collar 21% FiO2 overnight and failed capping trial this morning due to tachypnea.  Medications: Reviewed on Rounds  Physical Exam:  Vitals: Pulse 98 respirations 18 BP 135/82 O2 sat 98% centimeters O2 sat of 95 temp 98.2   Ventilator Settings ATC 21%  . General: Comfortable at this time . Eyes: Grossly normal lids, irises & conjunctiva . ENT: grossly tongue is normal . Neck: no obvious mass . Cardiovascular: S1 S2 normal no gallop . Respiratory: No rales or rhonchi noted . Abdomen: soft . Skin: no rash seen on limited exam . Musculoskeletal: not rigid . Psychiatric:unable to assess . Neurologic: no seizure no involuntary movements         Lab Data:   Basic Metabolic Panel: Recent Labs  Lab 02/10/19 0611 02/14/19 0531  NA 138 137  K 4.0 3.7  CL 100 100  CO2 21* 23  GLUCOSE 123* 140*  BUN 17 24*  CREATININE 0.66 0.69  CALCIUM 10.9* 10.1  MG 1.9 1.8  PHOS  --  3.7    ABG: No results for input(s): PHART, PCO2ART, PO2ART, HCO3, O2SAT in the last 168 hours.  Liver Function Tests: Recent Labs  Lab 02/10/19 0611 02/14/19 0531  AST 21 23  ALT 38 40  ALKPHOS 131* 138*  BILITOT 0.6 0.2*  PROT 7.5 7.2  ALBUMIN 2.6* 2.4*   No results for input(s): LIPASE, AMYLASE in the last 168 hours. No results for input(s): AMMONIA in the last 168 hours.  CBC: Recent Labs  Lab 02/10/19 0611 02/14/19 0531  WBC 5.1 6.5  HGB 12.9 10.6*  HCT 40.4 32.0*  MCV 90.8 89.9  PLT 269 267    Cardiac Enzymes: No results for input(s): CKTOTAL, CKMB,  CKMBINDEX, TROPONINI in the last 168 hours.  BNP (last 3 results) No results for input(s): BNP in the last 8760 hours.  ProBNP (last 3 results) No results for input(s): PROBNP in the last 8760 hours.  Radiological Exams: No results found.  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 Acute on chronic respiratory failure with hypoxia continue with aerosol trach collar as tolerated.  Continue secretion management and pulmonary toilet 2. Status epilepticus controlled 3. Aspiration pneumonia treated continue to follow 4. Anoxic encephalopathy prognosis remains poor   I have personally seen and evaluated the patient, evaluated laboratory and imaging results, formulated the assessment and plan and placed orders. The Patient requires high complexity decision making for assessment and support.  Case was discussed on Rounds with the Respiratory Therapy Staff  Yevonne Pax, MD [facility] Pulmonary Critical Care Medicine Sleep Medicine

## 2019-02-17 LAB — BASIC METABOLIC PANEL
Anion gap: 14 (ref 5–15)
BUN: 44 mg/dL — ABNORMAL HIGH (ref 8–23)
CO2: 22 mmol/L (ref 22–32)
Calcium: 10.4 mg/dL — ABNORMAL HIGH (ref 8.9–10.3)
Chloride: 102 mmol/L (ref 98–111)
Creatinine, Ser: 1.02 mg/dL — ABNORMAL HIGH (ref 0.44–1.00)
GFR calc Af Amer: >60 mL/min (ref >60)
GFR calc non Af Amer: 57 mL/min — ABNORMAL LOW (ref >60)
Glucose, Bld: 170 mg/dL — ABNORMAL HIGH (ref 70–99)
Potassium: 3.3 mmol/L — ABNORMAL LOW (ref 3.5–5.1)
Sodium: 138 mmol/L (ref 135–145)

## 2019-02-17 LAB — CBC
HCT: 33.1 % — ABNORMAL LOW (ref 36.0–46.0)
Hemoglobin: 10.7 g/dL — ABNORMAL LOW (ref 12.0–15.0)
MCH: 29.1 pg (ref 26.0–34.0)
MCHC: 32.3 g/dL (ref 30.0–36.0)
MCV: 89.9 fL (ref 80.0–100.0)
Platelets: 294 10*3/uL (ref 150–400)
RBC: 3.68 MIL/uL — ABNORMAL LOW (ref 3.87–5.11)
RDW: 15.2 % (ref 11.5–15.5)
WBC: 7.1 10*3/uL (ref 4.0–10.5)
nRBC: 0 % (ref 0.0–0.2)

## 2019-02-17 LAB — PROTIME-INR
INR: 1.2 (ref 0.8–1.2)
Prothrombin Time: 15.3 seconds — ABNORMAL HIGH (ref 11.4–15.2)

## 2019-02-17 NOTE — Progress Notes (Addendum)
Pulmonary Critical Care Medicine [facility] [place]   PULMONARY CRITICAL CARE SERVICE  PROGRESS NOTE  Date of Service: 02/17/2019  Erika Wang  NAT:[id number]  DOB: 04-May-1951   DOA: 01/31/2019  Referring Physician: Carron Curie, MD  HPI: Erika Wang is a 68 y.o. female seen for follow up of Acute on Chronic Respiratory Failure.  Patient failed capping for the last 2 days however since been tolerating it well today on room air.  Good saturations with no distress noted at this time.  Medications: Reviewed on Rounds  Physical Exam:  Vitals: Pulse 97 respirations 23 BP 140/80 O2 sat 97% temp 97.9  Ventilator Settings room air  . General: Comfortable at this time . Eyes: Grossly normal lids, irises & conjunctiva . ENT: grossly tongue is normal . Neck: no obvious mass . Cardiovascular: S1 S2 normal no gallop . Respiratory: No rales or rhonchi noted . Abdomen: soft . Skin: no rash seen on limited exam . Musculoskeletal: not rigid . Psychiatric:unable to assess . Neurologic: no seizure no involuntary movements         Lab Data:   Basic Metabolic Panel: Recent Labs  Lab 02/14/19 0531 02/17/19 1114  NA 137 138  K 3.7 3.3*  CL 100 102  CO2 23 22  GLUCOSE 140* 170*  BUN 24* 44*  CREATININE 0.69 1.02*  CALCIUM 10.1 10.4*  MG 1.8  --   PHOS 3.7  --     ABG: No results for input(s): PHART, PCO2ART, PO2ART, HCO3, O2SAT in the last 168 hours.  Liver Function Tests: Recent Labs  Lab 02/14/19 0531  AST 23  ALT 40  ALKPHOS 138*  BILITOT 0.2*  PROT 7.2  ALBUMIN 2.4*   No results for input(s): LIPASE, AMYLASE in the last 168 hours. No results for input(s): AMMONIA in the last 168 hours.  CBC: Recent Labs  Lab 02/14/19 0531 02/17/19 1114  WBC 6.5 7.1  HGB 10.6* 10.7*  HCT 32.0* 33.1*  MCV 89.9 89.9  PLT 267 294    Cardiac Enzymes: No results for input(s): CKTOTAL, CKMB, CKMBINDEX, TROPONINI in the last 168 hours.  BNP (last 3  results) No results for input(s): BNP in the last 8760 hours.  ProBNP (last 3 results) No results for input(s): PROBNP in the last 8760 hours.  Radiological Exams: No results found.  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 Acute on chronic respiratory failure with hypoxia continue with capping trials as tolerated.  Continue secretion management pulmonary toilet 2. Status epilepticus controlled 3. Aspiration pneumonia treated continue to follow 4. Anoxic encephalopathy prognosis remains poor   I have personally seen and evaluated the patient, evaluated laboratory and imaging results, formulated the assessment and plan and placed orders. The Patient requires high complexity decision making for assessment and support.  Case was discussed on Rounds with the Respiratory Therapy Staff  Yevonne Pax, MD [facility] Pulmonary Critical Care Medicine Sleep Medicine

## 2019-02-18 LAB — POTASSIUM: Potassium: 4.5 mmol/L (ref 3.5–5.1)

## 2019-02-18 NOTE — Progress Notes (Addendum)
Pulmonary Critical Care Medicine [facility] [place]   PULMONARY CRITICAL CARE SERVICE  PROGRESS NOTE  Date of Service: 02/18/2019  Erika Wang  GLO:[id number]  DOB: [birth year]   DOA: 01/31/2019  Referring Physician: Carron Curie, MD  HPI: Erika Wang is a 68 y.o. female seen for follow up of Acute on Chronic Respiratory Failure.  Patient is capped today.  Doing well on room air.  Using aerosol trach collar at night with no distress.  Medications: Reviewed on Rounds  Physical Exam:  Vitals: Pulse 87 respirations 22 BP 140/80 O2 sat 97% temp 98.0  Ventilator Settings not currently on ventilator  . General: Comfortable at this time . Eyes: Grossly normal lids, irises & conjunctiva . ENT: grossly tongue is normal . Neck: no obvious mass . Cardiovascular: S1 S2 normal no gallop . Respiratory: No rales or rhonchi noted . Abdomen: soft . Skin: no rash seen on limited exam . Musculoskeletal: not rigid . Psychiatric:unable to assess . Neurologic: no seizure no involuntary movements         Lab Data:   Basic Metabolic Panel: Recent Labs  Lab 02/14/19 0531 02/17/19 1114 02/18/19 0606  NA 137 138  --   K 3.7 3.3* 4.5  CL 100 102  --   CO2 23 22  --   GLUCOSE 140* 170*  --   BUN 24* 44*  --   CREATININE 0.69 1.02*  --   CALCIUM 10.1 10.4*  --   MG 1.8  --   --   PHOS 3.7  --   --     ABG: No results for input(s): PHART, PCO2ART, PO2ART, HCO3, O2SAT in the last 168 hours.  Liver Function Tests: Recent Labs  Lab 02/14/19 0531  AST 23  ALT 40  ALKPHOS 138*  BILITOT 0.2*  PROT 7.2  ALBUMIN 2.4*   No results for input(s): LIPASE, AMYLASE in the last 168 hours. No results for input(s): AMMONIA in the last 168 hours.  CBC: Recent Labs  Lab 02/14/19 0531 02/17/19 1114  WBC 6.5 7.1  HGB 10.6* 10.7*  HCT 32.0* 33.1*  MCV 89.9 89.9  PLT 267 294    Cardiac Enzymes: No results for input(s): CKTOTAL, CKMB, CKMBINDEX, TROPONINI in the last 168  hours.  BNP (last 3 results) No results for input(s): BNP in the last 8760 hours.  ProBNP (last 3 results) No results for input(s): PROBNP in the last 8760 hours.  Radiological Exams: No results found.  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 Acute on chronic respiratory failure with hypoxia patient will continue with capping trials as tolerated.  Continue secretion management pulmonary toilet. 2. Status epilepticus controlled at this time 3. Aspiration pneumonia treated continue to follow 4. Anoxic encephalopathy prognosis remains poor   I have personally seen and evaluated the patient, evaluated laboratory and imaging results, formulated the assessment and plan and placed orders. The Patient requires high complexity decision making for assessment and support.  Case was discussed on Rounds with the Respiratory Therapy Staff  Yevonne Pax, MD [facility] Pulmonary Critical Care Medicine Sleep Medicine

## 2019-02-19 ENCOUNTER — Other Ambulatory Visit (HOSPITAL_COMMUNITY): Payer: Medicare HMO

## 2019-02-19 ENCOUNTER — Encounter (HOSPITAL_COMMUNITY): Payer: Self-pay | Admitting: Interventional Radiology

## 2019-02-19 HISTORY — PX: IR GASTROSTOMY TUBE MOD SED: IMG625

## 2019-02-19 MED ORDER — LIDOCAINE HCL 1 % IJ SOLN
INTRAMUSCULAR | Status: AC
  Administered 2019-02-19: 11:00:00 10 mL
  Filled 2019-02-19: qty 20

## 2019-02-19 MED ORDER — FENTANYL CITRATE (PF) 100 MCG/2ML IJ SOLN
INTRAMUSCULAR | Status: AC
  Filled 2019-02-19: qty 2

## 2019-02-19 MED ORDER — CEFAZOLIN SODIUM-DEXTROSE 2-4 GM/100ML-% IV SOLN: 2.0000 g | Freq: Once | INTRAVENOUS | Status: DC

## 2019-02-19 MED ORDER — IOHEXOL 300 MG/ML  SOLN
50.0000 mL | Freq: Once | INTRAMUSCULAR | Status: AC | PRN
  Administered 2019-02-19: 20 mL via INTRAVENOUS

## 2019-02-19 MED ORDER — MIDAZOLAM HCL 2 MG/2ML IJ SOLN
INTRAMUSCULAR | Status: AC | PRN
  Administered 2019-02-19: 1 mg via INTRAVENOUS

## 2019-02-19 MED ORDER — FENTANYL CITRATE (PF) 100 MCG/2ML IJ SOLN
INTRAMUSCULAR | Status: AC | PRN
  Administered 2019-02-19: 25 ug via INTRAVENOUS

## 2019-02-19 MED ORDER — MIDAZOLAM HCL 2 MG/2ML IJ SOLN
INTRAMUSCULAR | Status: AC
  Filled 2019-02-19: qty 2

## 2019-02-19 MED ORDER — CEFAZOLIN SODIUM-DEXTROSE 2-4 GM/100ML-% IV SOLN
INTRAVENOUS | Status: AC
  Administered 2019-02-19: 11:00:00 2000 mg
  Filled 2019-02-19: qty 100

## 2019-02-19 NOTE — Sedation Documentation (Signed)
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

## 2019-02-19 NOTE — Procedures (Signed)
Interventional Radiology Procedure Note  Procedure: Percutaneous gastrostomy tube placement  Complications: None  Estimated Blood Loss: None  Findings: 20 Fr bumper retention g-tube placed with tip in body of stomach. OK to use in 24 hours.  Jodi Marble. Fredia Sorrow, M.D Pager:  [phone]

## 2019-02-19 NOTE — Progress Notes (Addendum)
Pulmonary Critical Care Medicine [place]   PULMONARY CRITICAL CARE SERVICE  PROGRESS NOTE  Date of Service: 02/19/2019  Erika Wang  HDQ:[id number]  DOB: August 17, 1951   DOA: 01/31/2019  Referring Physician: Merton Border, MD  HPI: Erika Wang is a 68 y.o. female seen for follow up of Acute on Chronic Respiratory Failure.  Patient is not capped today went down for PEG tube placement and is currently resting on trach collar.  Medications: Reviewed on Rounds  Physical Exam:  Vitals: Pulse 84 respirations 20 BP 157/88 O2 sat 100% temp 96.8  Ventilator Settings ATC 21%  . General: Comfortable at this time . Eyes: Grossly normal lids, irises & conjunctiva . ENT: grossly tongue is normal . Neck: no obvious mass . Cardiovascular: S1 S2 normal no gallop . Respiratory: No rales or rhonchi noted . Abdomen: soft . Skin: no rash seen on limited exam . Musculoskeletal: not rigid . Psychiatric:unable to assess . Neurologic: no seizure no involuntary movements         Lab Data:   Basic Metabolic Panel: Recent Labs  Lab 02/14/19 0531 02/17/19 1114 02/18/19 0606  NA 137 138  --   K 3.7 3.3* 4.5  CL 100 102  --   CO2 23 22  --   GLUCOSE 140* 170*  --   BUN 24* 44*  --   CREATININE 0.69 1.02*  --   CALCIUM 10.1 10.4*  --   MG 1.8  --   --   PHOS 3.7  --   --     ABG: No results for input(s): PHART, PCO2ART, PO2ART, HCO3, O2SAT in the last 168 hours.  Liver Function Tests: Recent Labs  Lab 02/14/19 0531  AST 23  ALT 40  ALKPHOS 138*  BILITOT 0.2*  PROT 7.2  ALBUMIN 2.4*   No results for input(s): LIPASE, AMYLASE in the last 168 hours. No results for input(s): AMMONIA in the last 168 hours.  CBC: Recent Labs  Lab 02/14/19 0531 02/17/19 1114  WBC 6.5 7.1  HGB 10.6* 10.7*  HCT 32.0* 33.1*  MCV 89.9 89.9  PLT 267 294    Cardiac Enzymes: No results for input(s): CKTOTAL, CKMB, CKMBINDEX, TROPONINI in the last 168 hours.  BNP (last 3  results) No results for input(s): BNP in the last 8760 hours.  ProBNP (last 3 results) No results for input(s): PROBNP in the last 8760 hours.  Radiological Exams: Ir Gastrostomy Tube Mod Sed  Result Date: 02/19/2019 CLINICAL DATA:  Respiratory failure and need for percutaneous gastrostomy tube for long-term nutritional needs. EXAM: PERCUTANEOUS GASTROSTOMY TUBE PLACEMENT ANESTHESIA/SEDATION: 1.0 mg IV Versed; 25 mcg IV Fentanyl. Total Moderate Sedation Time 12 minutes. The patient's level of consciousness and physiologic status were continuously monitored during the procedure by Radiology nursing. CONTRAST:  61m OMNIPAQUE IOHEXOL 300 MG/ML  SOLN MEDICATIONS: 2 g IV Ancef. IV antibiotic was administered in an appropriate time interval prior to needle puncture of the skin. FLUOROSCOPY TIME:  5 minutes and 6 seconds.  9.7 mGy. PROCEDURE: The procedure, risks, benefits, and alternatives were explained to the patient's daughter. Questions regarding the procedure were encouraged and answered. The patient's daughter understands and consents to the procedure. A time-out was performed prior to initiating the procedure. A 5-French catheter was then advanced through the patient's mouth under fluoroscopy into the esophagus and to the level of the stomach. This catheter was used to insufflate the stomach with air under fluoroscopy. The abdominal wall was prepped with chlorhexidine  in a sterile fashion, and a sterile drape was applied covering the operative field. A sterile gown and sterile gloves were used for the procedure. Local anesthesia was provided with 1% Lidocaine. A skin incision was made in the upper abdominal wall. Under fluoroscopy, an 18 gauge trocar needle was advanced into the stomach. Contrast injection was performed to confirm intraluminal position of the needle tip. A single T tack was then deployed in the lumen of the stomach. This was brought up to tension at the skin surface. Over a guidewire, a  9-French sheath was advanced into the lumen of the stomach. The wire was left in place as a safety wire. A loop snare device from a percutaneous gastrostomy kit was then advanced into the stomach. A floppy guide wire was advanced through the orogastric catheter under fluoroscopy in the stomach. The loop snare advanced through the percutaneous gastric access was used to snare the guide wire. This allowed withdrawal of the loop snare out of the patient's mouth by retraction of the orogastric catheter and wire. A 20-French bumper retention gastrostomy tube was looped around the snare device. It was then pulled back through the patient's mouth. The retention bumper was brought up to the anterior gastric wall. The T tack suture was cut at the skin. The exiting gastrostomy tube was cut to appropriate length and a feeding adapter applied. The catheter was injected with contrast material to confirm position and a fluoroscopic spot image saved. The tube was then flushed with saline. A dressing was applied over the gastrostomy exit site. COMPLICATIONS: None. FINDINGS: The stomach distended well with air allowing safe placement of the gastrostomy tube. After placement, the tip of the gastrostomy tube lies in the body of the stomach. IMPRESSION: Percutaneous gastrostomy with placement of a 20-French bumper retention tube in the body of the stomach. This tube can be used for percutaneous feeds beginning in 24 hours after placement. Electronically Signed   By: Aletta Edouard M.D.   On: 02/19/2019 11:57    Assessment/Plan Active Problems:   Acute on chronic respiratory failure with hypoxia (HCC)   Status epilepticus due to refractory epilepsy ([place])   Aspiration pneumonia due to gastric secretions (HCC)   Anoxic encephalopathy ([place])   1. Acute on chronic respiratory failure with hypoxia patient will not have a capping trial today due to PEG tube placement will allow patient to rest.  Restart capping trials tomorrow  continue secretion management and pulmonary toilet. 2. Status epilepticus controlled at this time 3. Aspiration pneumonia treated continue to follow 4. Anoxic encephalopathy prognosis remains poor   I have personally seen and evaluated the patient, evaluated laboratory and imaging results, formulated the assessment and plan and placed orders. The Patient requires high complexity decision making for assessment and support.  Case was discussed on Rounds with the Respiratory Therapy Staff  Allyne Gee, MD [facility] Pulmonary Critical Care Medicine Sleep Medicine

## 2019-02-20 NOTE — Progress Notes (Addendum)
Patient ID: Erika Wang, female   DOB: [birth year], 68 y.o.   MRN: [id number]  IR Round note via phone per new regulations   G tube placed 4/22 in IR  Afeb VSS abd NT per RN  Site is clean and dry No bleeding  May use G tube now

## 2019-02-20 NOTE — Progress Notes (Addendum)
Pulmonary Critical Care Medicine [place]   PULMONARY CRITICAL CARE SERVICE  PROGRESS NOTE  Date of Service: 02/20/2019  Erika Wang  HQR:[id number]  DOB: 27-Nov-1950   DOA: 01/31/2019  Referring Physician: Merton Border, MD  HPI: Erika Wang is a 68 y.o. female seen for follow up of Acute on Chronic Respiratory Failure.  Patient remains on aerosol trach collar 21% FiO2.  Using PMV without difficulty.  Satting well with no distress.  Medications: Reviewed on Rounds  Physical Exam:  Vitals: Pulse 84 respirations 20 BP 135/85 O2 sat 97% temp 97.7  Ventilator Settings ATC 21%  . General: Comfortable at this time . Eyes: Grossly normal lids, irises & conjunctiva . ENT: grossly tongue is normal . Neck: no obvious mass . Cardiovascular: S1 S2 normal no gallop . Respiratory: No rales or rhonchi noted . Abdomen: soft . Skin: no rash seen on limited exam . Musculoskeletal: not rigid . Psychiatric:unable to assess . Neurologic: no seizure no involuntary movements         Lab Data:   Basic Metabolic Panel: Recent Labs  Lab 02/14/19 0531 02/17/19 1114 02/18/19 0606  NA 137 138  --   K 3.7 3.3* 4.5  CL 100 102  --   CO2 23 22  --   GLUCOSE 140* 170*  --   BUN 24* 44*  --   CREATININE 0.69 1.02*  --   CALCIUM 10.1 10.4*  --   MG 1.8  --   --   PHOS 3.7  --   --     ABG: No results for input(s): PHART, PCO2ART, PO2ART, HCO3, O2SAT in the last 168 hours.  Liver Function Tests: Recent Labs  Lab 02/14/19 0531  AST 23  ALT 40  ALKPHOS 138*  BILITOT 0.2*  PROT 7.2  ALBUMIN 2.4*   No results for input(s): LIPASE, AMYLASE in the last 168 hours. No results for input(s): AMMONIA in the last 168 hours.  CBC: Recent Labs  Lab 02/14/19 0531 02/17/19 1114  WBC 6.5 7.1  HGB 10.6* 10.7*  HCT 32.0* 33.1*  MCV 89.9 89.9  PLT 267 294    Cardiac Enzymes: No results for input(s): CKTOTAL, CKMB, CKMBINDEX, TROPONINI in the last 168  hours.  BNP (last 3 results) No results for input(s): BNP in the last 8760 hours.  ProBNP (last 3 results) No results for input(s): PROBNP in the last 8760 hours.  Radiological Exams: Ir Gastrostomy Tube Mod Sed  Result Date: 02/19/2019 CLINICAL DATA:  Respiratory failure and need for percutaneous gastrostomy tube for long-term nutritional needs. EXAM: PERCUTANEOUS GASTROSTOMY TUBE PLACEMENT ANESTHESIA/SEDATION: 1.0 mg IV Versed; 25 mcg IV Fentanyl. Total Moderate Sedation Time 12 minutes. The patient's level of consciousness and physiologic status were continuously monitored during the procedure by Radiology nursing. CONTRAST:  71m OMNIPAQUE IOHEXOL 300 MG/ML  SOLN MEDICATIONS: 2 g IV Ancef. IV antibiotic was administered in an appropriate time interval prior to needle puncture of the skin. FLUOROSCOPY TIME:  5 minutes and 6 seconds.  9.7 mGy. PROCEDURE: The procedure, risks, benefits, and alternatives were explained to the patient's daughter. Questions regarding the procedure were encouraged and answered. The patient's daughter understands and consents to the procedure. A time-out was performed prior to initiating the procedure. A 5-French catheter was then advanced through the patient's mouth under fluoroscopy into the esophagus and to the level of the stomach. This catheter was used to insufflate the stomach with air under fluoroscopy. The abdominal wall was prepped with  chlorhexidine in a sterile fashion, and a sterile drape was applied covering the operative field. A sterile gown and sterile gloves were used for the procedure. Local anesthesia was provided with 1% Lidocaine. A skin incision was made in the upper abdominal wall. Under fluoroscopy, an 18 gauge trocar needle was advanced into the stomach. Contrast injection was performed to confirm intraluminal position of the needle tip. A single T tack was then deployed in the lumen of the stomach. This was brought up to tension at the skin surface.  Over a guidewire, a 9-French sheath was advanced into the lumen of the stomach. The wire was left in place as a safety wire. A loop snare device from a percutaneous gastrostomy kit was then advanced into the stomach. A floppy guide wire was advanced through the orogastric catheter under fluoroscopy in the stomach. The loop snare advanced through the percutaneous gastric access was used to snare the guide wire. This allowed withdrawal of the loop snare out of the patient's mouth by retraction of the orogastric catheter and wire. A 20-French bumper retention gastrostomy tube was looped around the snare device. It was then pulled back through the patient's mouth. The retention bumper was brought up to the anterior gastric wall. The T tack suture was cut at the skin. The exiting gastrostomy tube was cut to appropriate length and a feeding adapter applied. The catheter was injected with contrast material to confirm position and a fluoroscopic spot image saved. The tube was then flushed with saline. A dressing was applied over the gastrostomy exit site. COMPLICATIONS: None. FINDINGS: The stomach distended well with air allowing safe placement of the gastrostomy tube. After placement, the tip of the gastrostomy tube lies in the body of the stomach. IMPRESSION: Percutaneous gastrostomy with placement of a 20-French bumper retention tube in the body of the stomach. This tube can be used for percutaneous feeds beginning in 24 hours after placement. Electronically Signed   By: Aletta Edouard M.D.   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 Acute on chronic respiratory failure with hypoxia patient is doing well on 21% aerosol trach collar with PMV in place.  Will attempt capping at this time.  Continue  pulmonary toilet and secretion management 2. Status epilepticus controlled at this time 3. Aspiration pneumonia treated continue to follow 4. Anoxic encephalopathy prognosis remains poor   I have personally seen and evaluated the patient, evaluated laboratory and imaging results, formulated the assessment and plan and placed orders. The Patient requires high complexity decision making for assessment and support.  Case was discussed on Rounds with the Respiratory Therapy Staff  Allyne Gee, MD [facility] Pulmonary Critical Care Medicine Sleep Medicine

## 2019-02-21 ENCOUNTER — Other Ambulatory Visit (HOSPITAL_COMMUNITY): Payer: Medicare HMO

## 2019-02-21 LAB — CBC
HCT: 32.8 % — ABNORMAL LOW (ref 36.0–46.0)
Hemoglobin: 10.5 g/dL — ABNORMAL LOW (ref 12.0–15.0)
MCH: 29.1 pg (ref 26.0–34.0)
MCHC: 32 g/dL (ref 30.0–36.0)
MCV: 90.9 fL (ref 80.0–100.0)
Platelets: 259 10*3/uL (ref 150–400)
RBC: 3.61 MIL/uL — ABNORMAL LOW (ref 3.87–5.11)
RDW: 15.3 % (ref 11.5–15.5)
WBC: 9 10*3/uL (ref 4.0–10.5)
nRBC: 0 % (ref 0.0–0.2)

## 2019-02-21 LAB — COMPREHENSIVE METABOLIC PANEL
ALT: 32 U/L (ref 0–44)
AST: 22 U/L (ref 15–41)
Albumin: 2 g/dL — ABNORMAL LOW (ref 3.5–5.0)
Alkaline Phosphatase: 158 U/L — ABNORMAL HIGH (ref 38–126)
Anion gap: 14 (ref 5–15)
BUN: 37 mg/dL — ABNORMAL HIGH (ref 8–23)
CO2: 23 mmol/L (ref 22–32)
Calcium: 10.5 mg/dL — ABNORMAL HIGH (ref 8.9–10.3)
Chloride: 103 mmol/L (ref 98–111)
Creatinine, Ser: 1.13 mg/dL — ABNORMAL HIGH (ref 0.44–1.00)
GFR calc Af Amer: 59 mL/min — ABNORMAL LOW (ref >60)
GFR calc non Af Amer: 51 mL/min — ABNORMAL LOW (ref >60)
Glucose, Bld: 131 mg/dL — ABNORMAL HIGH (ref 70–99)
Potassium: 3.8 mmol/L (ref 3.5–5.1)
Sodium: 140 mmol/L (ref 135–145)
Total Bilirubin: 0.8 mg/dL (ref 0.3–1.2)
Total Protein: 7.2 g/dL (ref 6.5–8.1)

## 2019-02-21 LAB — PHENYTOIN LEVEL, TOTAL: Phenytoin Lvl: 23.7 ug/mL — ABNORMAL HIGH (ref 10.0–20.0)

## 2019-02-21 LAB — MAGNESIUM: Magnesium: 2.1 mg/dL (ref 1.7–2.4)

## 2019-02-21 NOTE — Progress Notes (Addendum)
[facility]   PULMONARY CRITICAL CARE SERVICE  PROGRESS NOTE  Date of Service: 02/21/2019  Erika Wang  ONG:[id number]:[id number]  DOB: 01-01-1951   DOA: 01/31/2019  Referring Physician: Carron CurieAli Hijazi, MD  HPI: Erika KellsMelvena Agner is a 68 y.o. female seen for follow up of Acute on Chronic Respiratory Failure.  Patient had PEG placed 2 days ago and staff reports patient was vomiting tube feed and there was to be coming from her trach.  Quite possibly aspirated will continue to follow-up this.  Currently remains on aerosol trach collar 21% FiO2 using the PMV without difficulty.  Tube feeds were held.  Medications: Reviewed on Rounds  Physical Exam:  Vitals: Pulse 94 respirations 18 BP 159/87 O2 sat 98% temp 97.8  Ventilator Settings ATC 21%  . General: Comfortable at this time . Eyes: Grossly normal lids, irises & conjunctiva . ENT: grossly tongue is normal . Neck: no obvious mass . Cardiovascular: S1 S2 normal no gallop . Respiratory: No rales or rhonchi noted . Abdomen: soft . Skin: no rash seen on limited exam . Musculoskeletal: not rigid . Psychiatric:unable to assess . Neurologic: no seizure no involuntary movements         Lab Data:   Basic Metabolic Panel: Recent Labs  Lab 02/17/19 1114 02/18/19 0606 02/21/19 1254  NA 138  --  140  K 3.3* 4.5 3.8  CL 102  --  103  CO2 22  --  23  GLUCOSE 170*  --  131*  BUN 44*  --  37*  CREATININE 1.02*  --  1.13*  CALCIUM 10.4*  --  10.5*  MG  --   --  2.1    ABG: No results for input(s): PHART, PCO2ART, PO2ART, HCO3, O2SAT in the last 168 hours.  Liver Function Tests: Recent Labs  Lab 02/21/19 1254  AST 22  ALT 32  ALKPHOS 158*  BILITOT 0.8  PROT 7.2  ALBUMIN 2.0*   No results for input(s): LIPASE, AMYLASE in the last 168 hours. No results for input(s): AMMONIA in the last 168 hours.  CBC: Recent Labs  Lab 02/17/19 1114 02/21/19 1254  WBC 7.1 9.0  HGB 10.7* 10.5*   HCT 33.1* 32.8*  MCV 89.9 90.9  PLT 294 259    Cardiac Enzymes: No results for input(s): CKTOTAL, CKMB, CKMBINDEX, TROPONINI in the last 168 hours.  BNP (last 3 results) No results for input(s): BNP in the last 8760 hours.  ProBNP (last 3 results) No results for input(s): PROBNP in the last 8760 hours.  Radiological Exams: Dg Abd 1 View  Result Date: 02/21/2019 CLINICAL DATA:  Nausea, vomiting. EXAM: ABDOMEN - 1 VIEW COMPARISON:  Radiograph of January 31, 2019. FINDINGS: Nasogastric tube has been removed. Interval placement of gastrostomy tube seen in left upper quadrant in grossly good position. Moderately dilated large bowel loop is noted concerning for ileus. Phleboliths are noted in the pelvis. No definite small bowel dilatation is noted. IMPRESSION: Interval placement of gastrostomy tube in grossly good position. Moderately dilated large bowel loop is noted concerning for possible ileus. Electronically Signed   By: Lupita RaiderJames  Green Jr M.D.   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 Acute on chronic respiratory failure with hypoxia patient is doing well  on 21% aerosol trach collar.  Using PMV without difficulty.  Continue secretion management and pulmonary toilet. 2. Status epilepticus controlled at this time 3. Aspiration pneumonia originally treated however had another event we will continue to follow 4. Anoxic encephalopathy prognosis remains poor   I have personally seen and evaluated the patient, evaluated laboratory and imaging results, formulated the assessment and plan and placed orders. The Patient requires high complexity decision making for assessment and support.  Case was discussed on Rounds with the Respiratory Therapy Staff  Yevonne Pax, MD  [facility] Pulmonary Critical Care Medicine Sleep Medicine

## 2019-02-22 ENCOUNTER — Other Ambulatory Visit (HOSPITAL_COMMUNITY): Payer: Medicare HMO

## 2019-02-22 LAB — LEVETIRACETAM LEVEL: Levetiracetam Lvl: 84.7 ug/mL — ABNORMAL HIGH (ref 10.0–40.0)

## 2019-02-22 NOTE — Progress Notes (Addendum)
[facility]   PULMONARY CRITICAL CARE SERVICE  PROGRESS NOTE  Date of Service: 02/22/2019  Erika Wang  WUJ:[id number]  DOB: [birth year]   DOA: 01/31/2019  Referring Physician: Carron CurieAli Hijazi, MD  HPI: Erika KellsMelvena Wang is a 68 y.o. female seen for follow up of Acute on Chronic Respiratory Failure.  Patient remains on aerosol trach collar 21%.  No fever or distress noted.  Medications: Reviewed on Rounds  Physical Exam:  Vitals: Pulse 99 respirations 20 BP 130/75 O2 sat 97% temp 97.7  Ventilator Settings ATC 21%  . General: Comfortable at this time . Eyes: Grossly normal lids, irises & conjunctiva . ENT: grossly tongue is normal . Neck: no obvious mass . Cardiovascular: S1 S2 normal no gallop . Respiratory: No rales or rhonchi noted . Abdomen: soft . Skin: no rash seen on limited exam . Musculoskeletal: not rigid . Psychiatric:unable to assess . Neurologic: no seizure no involuntary movements         Lab Data:   Basic Metabolic Panel: Recent Labs  Lab 02/17/19 1114 02/18/19 0606 02/21/19 1254  NA 138  --  140  K 3.3* 4.5 3.8  CL 102  --  103  CO2 22  --  23  GLUCOSE 170*  --  131*  BUN 44*  --  37*  CREATININE 1.02*  --  1.13*  CALCIUM 10.4*  --  10.5*  MG  --   --  2.1    ABG: No results for input(s): PHART, PCO2ART, PO2ART, HCO3, O2SAT in the last 168 hours.  Liver Function Tests: Recent Labs  Lab 02/21/19 1254  AST 22  ALT 32  ALKPHOS 158*  BILITOT 0.8  PROT 7.2  ALBUMIN 2.0*   No results for input(s): LIPASE, AMYLASE in the last 168 hours. No results for input(s): AMMONIA in the last 168 hours.  CBC: Recent Labs  Lab 02/17/19 1114 02/21/19 1254  WBC 7.1 9.0  HGB 10.7* 10.5*  HCT 33.1* 32.8*  MCV 89.9 90.9  PLT 294 259    Cardiac Enzymes: No results for input(s): CKTOTAL, CKMB, CKMBINDEX, TROPONINI in the last 168 hours.  BNP (last 3 results) No results for input(s): BNP in the  last 8760 hours.  ProBNP (last 3 results) No results for input(s): PROBNP in the last 8760 hours.  Radiological Exams: Dg Abd 1 View  Result Date: 02/21/2019 CLINICAL DATA:  Nausea, vomiting. EXAM: ABDOMEN - 1 VIEW COMPARISON:  Radiograph of January 31, 2019. FINDINGS: Nasogastric tube has been removed. Interval placement of gastrostomy tube seen in left upper quadrant in grossly good position. Moderately dilated large bowel loop is noted concerning for ileus. Phleboliths are noted in the pelvis. No definite small bowel dilatation is noted. IMPRESSION: Interval placement of gastrostomy tube in grossly good position. Moderately dilated large bowel loop is noted concerning for possible ileus. Electronically Signed   By: Lupita RaiderJames  Green Jr M.D.   On: 02/21/2019 13:43   Dg Chest Port 1 View  Result Date: 02/22/2019 CLINICAL DATA:  Respiratory failure. Check tracheostomy position. Vomiting. EXAM: PORTABLE CHEST 1 VIEW COMPARISON:  02/01/2019. FINDINGS: Low lung volumes. Essentially stable cardiac silhouette. Tracheostomy good position, with its tip similar 4.2 Cm above carina. Chronic LEFT base subsegmental atelectasis versus scarring. No definite consolidation or edema. Osteopenia. Feeding tube has been removed. IMPRESSION: Stable chest. No evidence of aspiration pneumonia. Tracheostomy tube appears to be in good position. Electronically Signed   By: Dale DurhamJohn T Curnes M.D.  On: 02/22/2019 07:51   Dg Abd Portable 1v  Result Date: 02/22/2019 CLINICAL DATA:  Abdominal distention. EXAM: PORTABLE ABDOMEN - 1 VIEW COMPARISON:  02/21/2019. FINDINGS: LEFT upper quadrant PEG tube appears to be in good position. No free air or visible bowel obstruction. Chronic compression deformities with osteopenia. IMPRESSION: Unremarkable bowel gas pattern. Unchanged position and apparent satisfactory location of PEG tube. Electronically Signed   By: Elsie Stain M.D.   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 Acute on chronic respiratory failure with hypoxia doing well on 21% aerosol trach collar.  Using PMV without difficulty.  Continue pulmonary toilet and secretion management 2. Status epilepticus controlled at this time 3. Aspiration pneumonia continue to monitor 4. Anoxic encephalopathy prognosis remains poor   I have personally seen and evaluated the patient, evaluated laboratory and imaging results, formulated the assessment and plan and placed orders. The Patient requires high complexity decision making for assessment and support.  Case was discussed on Rounds with the Respiratory Therapy Staff  Yevonne Pax, MD [facility] Pulmonary Critical Care Medicine Sleep Medicine

## 2019-02-23 NOTE — Progress Notes (Addendum)
[facility]   PULMONARY CRITICAL CARE SERVICE  PROGRESS NOTE  Date of Service: 02/23/2019  Erika Wang  WUJ:[id number]:[id number]  DOB: [birth year]   DOA: 01/31/2019  Referring Physician: Carron CurieAli Hijazi, MD  HPI: Erika KellsMelvena Rottinghaus is a 68 y.o. female seen for follow up of Acute on Chronic Respiratory Failure.  Patient continues on 21% aerosol trach collar.  No distress noted at this time.  Patient remains afebrile.  Medications: Reviewed on Rounds  Physical Exam:  Vitals: Pulse 100 respirations 20 BP 142/65 O2 sat 97% temp 98.9  Ventilator Settings 21% ATC  . General: Comfortable at this time . Eyes: Grossly normal lids, irises & conjunctiva . ENT: grossly tongue is normal . Neck: no obvious mass . Cardiovascular: S1 S2 normal no gallop . Respiratory: No rales or rhonchi noted . Abdomen: soft . Skin: no rash seen on limited exam . Musculoskeletal: not rigid . Psychiatric:unable to assess . Neurologic: no seizure no involuntary movements         Lab Data:   Basic Metabolic Panel: Recent Labs  Lab 02/17/19 1114 02/18/19 0606 02/21/19 1254  NA 138  --  140  K 3.3* 4.5 3.8  CL 102  --  103  CO2 22  --  23  GLUCOSE 170*  --  131*  BUN 44*  --  37*  CREATININE 1.02*  --  1.13*  CALCIUM 10.4*  --  10.5*  MG  --   --  2.1    ABG: No results for input(s): PHART, PCO2ART, PO2ART, HCO3, O2SAT in the last 168 hours.  Liver Function Tests: Recent Labs  Lab 02/21/19 1254  AST 22  ALT 32  ALKPHOS 158*  BILITOT 0.8  PROT 7.2  ALBUMIN 2.0*   No results for input(s): LIPASE, AMYLASE in the last 168 hours. No results for input(s): AMMONIA in the last 168 hours.  CBC: Recent Labs  Lab 02/17/19 1114 02/21/19 1254  WBC 7.1 9.0  HGB 10.7* 10.5*  HCT 33.1* 32.8*  MCV 89.9 90.9  PLT 294 259    Cardiac Enzymes: No results for input(s): CKTOTAL, CKMB, CKMBINDEX, TROPONINI in the last 168 hours.  BNP (last 3 results) No  results for input(s): BNP in the last 8760 hours.  ProBNP (last 3 results) No results for input(s): PROBNP in the last 8760 hours.  Radiological Exams: Dg Chest Port 1 View  Result Date: 02/22/2019 CLINICAL DATA:  Respiratory failure. Check tracheostomy position. Vomiting. EXAM: PORTABLE CHEST 1 VIEW COMPARISON:  02/01/2019. FINDINGS: Low lung volumes. Essentially stable cardiac silhouette. Tracheostomy good position, with its tip similar 4.2 Cm above carina. Chronic LEFT base subsegmental atelectasis versus scarring. No definite consolidation or edema. Osteopenia. Feeding tube has been removed. IMPRESSION: Stable chest. No evidence of aspiration pneumonia. Tracheostomy tube appears to be in good position. Electronically Signed   By: Elsie StainJohn T Curnes M.D.   On: 02/22/2019 07:51   Dg Abd Portable 1v  Result Date: 02/22/2019 CLINICAL DATA:  Abdominal distention. EXAM: PORTABLE ABDOMEN - 1 VIEW COMPARISON:  02/21/2019. FINDINGS: LEFT upper quadrant PEG tube appears to be in good position. No free air or visible bowel obstruction. Chronic compression deformities with osteopenia. IMPRESSION: Unremarkable bowel gas pattern. Unchanged position and apparent satisfactory location of PEG tube. Electronically Signed   By: Elsie StainJohn T Curnes M.D.   On: 02/22/2019 07:52    Assessment/Plan Active Problems:   Acute on chronic respiratory failure with hypoxia (HCC)   Status epilepticus due to  refractory epilepsy (HCC)   Aspiration pneumonia due to gastric secretions (HCC)   Anoxic encephalopathy (HCC)   1. Acute on chronic respiratory failure with hypoxia doing well on 21% aerosol trach collar.  Using PMV without difficulty.  Continue secretion management pulmonary toilet and supportive measures 2. Status epilepticus controlled at this time 3. Aspiration pneumonia continue to monitor 4. Anoxic encephalopathy prognosis remains poor   I have personally seen and evaluated the patient, evaluated laboratory and  imaging results, formulated the assessment and plan and placed orders. The Patient requires high complexity decision making for assessment and support.  Case was discussed on Rounds with the Respiratory Therapy Staff  Yevonne Pax, MD [facility] Pulmonary Critical Care Medicine Sleep Medicine

## 2019-02-24 NOTE — Progress Notes (Addendum)
Pulmonary Critical Care Medicine [facility] [place]   PULMONARY CRITICAL CARE SERVICE  PROGRESS NOTE  Date of Service: 02/24/2019  Erika Wang  GYK:[id number]  DOB: 1951/09/26   DOA: 01/31/2019  Referring Physician: Carron Curie, MD  HPI: Erika Wang is a 68 y.o. female seen for follow up of Acute on Chronic Respiratory Failure.  Patient remains on aerosol trach collar 21% FiO2.  No distress at this time.  Medications: Reviewed on Rounds  Physical Exam:  Vitals: Pulse 102 respirations 27 BP 165/84 O2 sat 99% temp 98.4  Ventilator Settings ATC 21%  . General: Comfortable at this time . Eyes: Grossly normal lids, irises & conjunctiva . ENT: grossly tongue is normal . Neck: no obvious mass . Cardiovascular: S1 S2 normal no gallop . Respiratory: No rales or rhonchi noted . Abdomen: soft . Skin: no rash seen on limited exam . Musculoskeletal: not rigid . Psychiatric:unable to assess . Neurologic: no seizure no involuntary movements         Lab Data:   Basic Metabolic Panel: Recent Labs  Lab 02/18/19 0606 02/21/19 1254  NA  --  140  K 4.5 3.8  CL  --  103  CO2  --  23  GLUCOSE  --  131*  BUN  --  37*  CREATININE  --  1.13*  CALCIUM  --  10.5*  MG  --  2.1    ABG: No results for input(s): PHART, PCO2ART, PO2ART, HCO3, O2SAT in the last 168 hours.  Liver Function Tests: Recent Labs  Lab 02/21/19 1254  AST 22  ALT 32  ALKPHOS 158*  BILITOT 0.8  PROT 7.2  ALBUMIN 2.0*   No results for input(s): LIPASE, AMYLASE in the last 168 hours. No results for input(s): AMMONIA in the last 168 hours.  CBC: Recent Labs  Lab 02/21/19 1254  WBC 9.0  HGB 10.5*  HCT 32.8*  MCV 90.9  PLT 259    Cardiac Enzymes: No results for input(s): CKTOTAL, CKMB, CKMBINDEX, TROPONINI in the last 168 hours.  BNP (last 3 results) No results for input(s): BNP in the last 8760 hours.  ProBNP (last 3 results) No results for input(s): PROBNP in the last 8760  hours.  Radiological Exams: No results found.  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 Acute on chronic respiratory failure with hypoxia doing well on 21% aerosol trach collar at this time.  Using PMV without difficulty.  Continue secretion management and supportive measures 2. Status epilepticus controlled at this time 3. Aspiration pneumonia continue to monitor 4. Anoxic encephalopathy prognosis remains poor   I have personally seen and evaluated the patient, evaluated laboratory and imaging results, formulated the assessment and plan and placed orders. The Patient requires high complexity decision making for assessment and support.  Case was discussed on Rounds with the Respiratory Therapy Staff  Yevonne Pax, MD [facility] Pulmonary Critical Care Medicine Sleep Medicine

## 2019-02-25 NOTE — Progress Notes (Addendum)
Pulmonary Critical Care Medicine [facility] [place]   PULMONARY CRITICAL CARE SERVICE  PROGRESS NOTE  Date of Service: 02/25/2019  Erika Wang  [id number]:[id number]  DOB: 02/12/1951   DOA: 01/31/2019  Referring Physician: Carron Curie, MD  HPI: Erika Wang is a 68 y.o. female seen for follow up of Acute on Chronic Respiratory Failure.  Patient remains on aerosol trach collar 21% FiO2.  No distress or fever noted at this time.  Medications: Reviewed on Rounds  Physical Exam:  Vitals: Pulse 70 respirations 18 BP 154/85 O2 sat 99% temp 7.6  Ventilator Settings ATC 21%  . General: Comfortable at this time . Eyes: Grossly normal lids, irises & conjunctiva . ENT: grossly tongue is normal . Neck: no obvious mass . Cardiovascular: S1 S2 normal no gallop . Respiratory: No rales or rhonchi noted . Abdomen: soft . Skin: no rash seen on limited exam . Musculoskeletal: not rigid . Psychiatric:unable to assess . Neurologic: no seizure no involuntary movements         Lab Data:   Basic Metabolic Panel: Recent Labs  Lab 02/21/19 1254  NA 140  K 3.8  CL 103  CO2 23  GLUCOSE 131*  BUN 37*  CREATININE 1.13*  CALCIUM 10.5*  MG 2.1    ABG: No results for input(s): PHART, PCO2ART, PO2ART, HCO3, O2SAT in the last 168 hours.  Liver Function Tests: Recent Labs  Lab 02/21/19 1254  AST 22  ALT 32  ALKPHOS 158*  BILITOT 0.8  PROT 7.2  ALBUMIN 2.0*   No results for input(s): LIPASE, AMYLASE in the last 168 hours. No results for input(s): AMMONIA in the last 168 hours.  CBC: Recent Labs  Lab 02/21/19 1254  WBC 9.0  HGB 10.5*  HCT 32.8*  MCV 90.9  PLT 259    Cardiac Enzymes: No results for input(s): CKTOTAL, CKMB, CKMBINDEX, TROPONINI in the last 168 hours.  BNP (last 3 results) No results for input(s): BNP in the last 8760 hours.  ProBNP (last 3 results) No results for input(s): PROBNP in the last 8760 hours.  Radiological Exams: No results  found.  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 Acute on chronic respiratory failure with hypoxia doing well on 21% aerosol trach collar at this time.  Using PMV without difficulty.  Continue supportive measures.  Necko status epilepticus controlled at this time 2. Aspiration pneumonia continue to monitor 3. Anoxic encephalopathy prognosis remains poor   I have personally seen and evaluated the patient, evaluated laboratory and imaging results, formulated the assessment and plan and placed orders. The Patient requires high complexity decision making for assessment and support.  Case was discussed on Rounds with the Respiratory Therapy Staff  Yevonne Pax, MD [facility] Pulmonary Critical Care Medicine Sleep Medicine

## 2019-02-26 ENCOUNTER — Other Ambulatory Visit (HOSPITAL_COMMUNITY): Payer: Medicare HMO

## 2019-02-26 ENCOUNTER — Encounter: Payer: Self-pay | Admitting: Radiology

## 2019-02-26 LAB — BASIC METABOLIC PANEL
Anion gap: 13 (ref 5–15)
BUN: 35 mg/dL — ABNORMAL HIGH (ref 8–23)
CO2: 24 mmol/L (ref 22–32)
Calcium: 10.8 mg/dL — ABNORMAL HIGH (ref 8.9–10.3)
Chloride: 101 mmol/L (ref 98–111)
Creatinine, Ser: 0.99 mg/dL (ref 0.44–1.00)
GFR calc Af Amer: >60 mL/min (ref >60)
GFR calc non Af Amer: 59 mL/min — ABNORMAL LOW (ref >60)
Glucose, Bld: 179 mg/dL — ABNORMAL HIGH (ref 70–99)
Potassium: 4.1 mmol/L (ref 3.5–5.1)
Sodium: 138 mmol/L (ref 135–145)

## 2019-02-26 LAB — CBC
HCT: 32.5 % — ABNORMAL LOW (ref 36.0–46.0)
Hemoglobin: 10.4 g/dL — ABNORMAL LOW (ref 12.0–15.0)
MCH: 29.3 pg (ref 26.0–34.0)
MCHC: 32 g/dL (ref 30.0–36.0)
MCV: 91.5 fL (ref 80.0–100.0)
Platelets: 276 10*3/uL (ref 150–400)
RBC: 3.55 MIL/uL — ABNORMAL LOW (ref 3.87–5.11)
RDW: 15.8 % — ABNORMAL HIGH (ref 11.5–15.5)
WBC: 7.5 10*3/uL (ref 4.0–10.5)
nRBC: 0 % (ref 0.0–0.2)

## 2019-02-26 LAB — PHOSPHORUS: Phosphorus: 3.7 mg/dL (ref 2.5–4.6)

## 2019-02-26 LAB — MAGNESIUM: Magnesium: 2.1 mg/dL (ref 1.7–2.4)

## 2019-02-26 MED ORDER — IOHEXOL 300 MG/ML  SOLN
100.0000 mL | Freq: Once | INTRAMUSCULAR | Status: AC | PRN
  Administered 2019-02-26: 100 mL via INTRAVENOUS

## 2019-02-26 NOTE — Progress Notes (Signed)
Pulmonary Critical Care Medicine [facility] [place]   PULMONARY CRITICAL CARE SERVICE  PROGRESS NOTE  Date of Service: 02/26/2019  Erika Wang  DJM:[id number]  DOB: Jul 04, 1951   DOA: 01/31/2019  Referring Physician: Carron Curie, MD  HPI: Erika Wang is a 68 y.o. female seen for follow up of Acute on Chronic Respiratory Failure.  Patient currently is on T collar has been on room air.  Also has been tolerating the PMV  Medications: Reviewed on Rounds  Physical Exam:  Vitals: Temperature 97.1 pulse 91 respiratory 22 blood pressure 168/85 saturations 97%  Ventilator Settings off the ventilator on T collar currently  . General: Comfortable at this time . Eyes: Grossly normal lids, irises & conjunctiva . ENT: grossly tongue is normal . Neck: no obvious mass . Cardiovascular: S1 S2 normal no gallop . Respiratory: No rhonchi no rales are noted at this time . Abdomen: soft . Skin: no rash seen on limited exam . Musculoskeletal: not rigid . Psychiatric:unable to assess . Neurologic: no seizure no involuntary movements         Lab Data:   Basic Metabolic Panel: Recent Labs  Lab 02/21/19 1254 02/26/19 0505  NA 140 138  K 3.8 4.1  CL 103 101  CO2 23 24  GLUCOSE 131* 179*  BUN 37* 35*  CREATININE 1.13* 0.99  CALCIUM 10.5* 10.8*  MG 2.1 2.1  PHOS  --  3.7    ABG: No results for input(s): PHART, PCO2ART, PO2ART, HCO3, O2SAT in the last 168 hours.  Liver Function Tests: Recent Labs  Lab 02/21/19 1254  AST 22  ALT 32  ALKPHOS 158*  BILITOT 0.8  PROT 7.2  ALBUMIN 2.0*   No results for input(s): LIPASE, AMYLASE in the last 168 hours. No results for input(s): AMMONIA in the last 168 hours.  CBC: Recent Labs  Lab 02/21/19 1254 02/26/19 0505  WBC 9.0 7.5  HGB 10.5* 10.4*  HCT 32.8* 32.5*  MCV 90.9 91.5  PLT 259 276    Cardiac Enzymes: No results for input(s): CKTOTAL, CKMB, CKMBINDEX, TROPONINI in the last 168 hours.  BNP (last 3  results) No results for input(s): BNP in the last 8760 hours.  ProBNP (last 3 results) No results for input(s): PROBNP in the last 8760 hours.  Radiological Exams: Dg Abd Portable 1v  Result Date: 02/26/2019 CLINICAL DATA:  Ileus. EXAM: PORTABLE ABDOMEN - 1 VIEW COMPARISON:  One-view abdomen 02/22/2019 FINDINGS: Peg tube is in place. Bowel gas pattern is unremarkable. There is no obstruction or free air. Vascular calcifications are noted. Degenerative changes are present in the hips. IMPRESSION: 1. Normal bowel gas pattern.  No obstruction or ileus. Electronically Signed   By: Marin Roberts M.D.   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 Acute on chronic respiratory failure with hypoxia patient continues to do fine on T collar continue aggressive pulmonary toilet supportive care. 2. Status epilepticus no active seizures noted 3. Aspiration pneumonia treated improving 4. Anoxic encephalopathy grossly unchanged we will continue with present management   I have personally seen and evaluated the patient, evaluated laboratory and imaging results, formulated the assessment and plan and placed orders. The Patient requires high complexity decision making for assessment and support.  Case was discussed on Rounds with the  Respiratory Therapy Staff  Allyne Gee, MD [facility] Pulmonary Critical Care Medicine Sleep Medicine

## 2019-02-27 NOTE — Progress Notes (Addendum)
Pulmonary Critical Care Medicine [facility] [place]   PULMONARY CRITICAL CARE SERVICE  PROGRESS NOTE  Date of Service: 02/27/2019  Erika Wang  [id number]  DOB: [birth year]   DOA: 01/31/2019  Referring Physician: Carron Curie, MD  HPI: Erika Wang is a 68 y.o. female seen for follow up of Acute on Chronic Respiratory Failure.  Patient continues on aerosol trach collar 21% FiO2.  Using PMV without difficulty.  Good saturations.  Medications: Reviewed on Rounds  Physical Exam:  Vitals: Pulse 61 respiration 20 BP 151/78 O2 sat 100% temp 97.3  Ventilator Settings ATC 21%  . General: Comfortable at this time . Eyes: Grossly normal lids, irises & conjunctiva . ENT: grossly tongue is normal . Neck: no obvious mass . Cardiovascular: S1 S2 normal no gallop . Respiratory: No rales or rhonchi noted . Abdomen: soft . Skin: no rash seen on limited exam . Musculoskeletal: not rigid . Psychiatric:unable to assess . Neurologic: no seizure no involuntary movements         Lab Data:   Basic Metabolic Panel: Recent Labs  Lab 02/21/19 1254 02/26/19 0505  NA 140 138  K 3.8 4.1  CL 103 101  CO2 23 24  GLUCOSE 131* 179*  BUN 37* 35*  CREATININE 1.13* 0.99  CALCIUM 10.5* 10.8*  MG 2.1 2.1  PHOS  --  3.7    ABG: No results for input(s): PHART, PCO2ART, PO2ART, HCO3, O2SAT in the last 168 hours.  Liver Function Tests: Recent Labs  Lab 02/21/19 1254  AST 22  ALT 32  ALKPHOS 158*  BILITOT 0.8  PROT 7.2  ALBUMIN 2.0*   No results for input(s): LIPASE, AMYLASE in the last 168 hours. No results for input(s): AMMONIA in the last 168 hours.  CBC: Recent Labs  Lab 02/21/19 1254 02/26/19 0505  WBC 9.0 7.5  HGB 10.5* 10.4*  HCT 32.8* 32.5*  MCV 90.9 91.5  PLT 259 276    Cardiac Enzymes: No results for input(s): CKTOTAL, CKMB, CKMBINDEX, TROPONINI in the last 168 hours.  BNP (last 3 results) No results for input(s): BNP in the last 8760  hours.  ProBNP (last 3 results) No results for input(s): PROBNP in the last 8760 hours.  Radiological Exams: Ct Abdomen Pelvis W Contrast  Result Date: 02/26/2019 CLINICAL DATA:  68 year old female ?PEG position, abdominal wall cellulitis?Marland Kitchen EXAM: CT ABDOMEN AND PELVIS WITH CONTRAST TECHNIQUE: Multidetector CT imaging of the abdomen and pelvis was performed using the standard protocol following bolus administration of intravenous contrast. CONTRAST:  OMNIPAQUE IOHEXOL 300 MG/ML  SOLN COMPARISON:  CT Abdomen and Pelvis without contrast 02/03/2019. FINDINGS: Lower chest: Confluent bilateral lower lobe atelectasis. Left lower lobe consolidation has largely resolved. Mild cardiomegaly. No pericardial or pleural effusion. Soft and calcified aortic atherosclerosis. Calcified coronary artery atherosclerosis and/or stent. Hepatobiliary: Negative liver and gallbladder. Pancreas: Negative. Spleen: Negative. Adrenals/Urinary Tract: Normal adrenal glands. Right side delayed nephrogram and hydronephrosis, although the right UPJ is indistinct and the obstructing etiology is unclear. There is no right hydroureter. Mild respiratory motion at the right kidney. No renal abscess or perinephric fluid. Contralateral left renal enhancement and contrast excretion appears within normal limits. Proximal left ureter is decompressed. Mildly distended but otherwise unremarkable urinary bladder. Numerous pelvic phleboliths. Stomach/Bowel: Incontinence of stool at the rectum which otherwise appears negative. Extensive diverticulosis of the large bowel from the hepatic flexure to the sigmoid. No active inflammation identified. Mildly increased retained stool. Oral contrast has reached the cecum. The cecum is in  the midline on a lax mesentery. Normal appendix on coronal image 31. No dilated or abnormal small bowel. Peg tube in place in the left upper quadrant on series 3, image 29. There is regional mild rectus muscle thickening and  subcutaneous stranding (image 25). Negative stomach. No other adverse features. No free air, free fluid. Vascular/Lymphatic: Aortoiliac calcified atherosclerosis. Major arterial structures are patent. Portal venous system is patent. No lymphadenopathy. Reproductive: Smooth hyperdense right labial cyst or mass appears stable and is probably incidental (series 3, image 78). Otherwise negative. Other: No pelvic free fluid. Musculoskeletal: Stable mild L1 compression fracture. Osteopenia. Gas containing sacral decubitus wound with progression since 02/03/2019. This overlies the S4 level and the S3 through S5 sacral segments now appear sclerotic and somewhat indistinct. Coccygeal segments are stable. No associated fluid collection. No other acute osseous abnormality identified. IMPRESSION: 1. Gas containing sacral decubitus wound with progression since 02/02/17, and the S3 through S5 sacral segments have become sclerotic and indistinct compatible with active sacral OSTEOMYELITIS. 2. Mild soft tissue inflammation about the PEG tube compatible with Cellulitis, but satisfactory PEG position and no other adverse features. 3. Obstructive Uropathy on the Right at the level of the UPJ, but the obstructing etiology is unclear. If renal function is threatened consider percutaneous decompression of the kidney. 4. No other acute or inflammatory process identified in the abdomen or pelvis. 5. Improved lung base ventilation with residual atelectasis. 6.  Aortic Atherosclerosis (ICD10-I70.0). 7. Extensive large bowel diverticulosis. Electronically Signed   By: Odessa FlemingH  Hall M.D.   On: 02/26/2019 23:26   Dg Abd Portable 1v  Result Date: 02/26/2019 CLINICAL DATA:  Ileus. EXAM: PORTABLE ABDOMEN - 1 VIEW COMPARISON:  One-view abdomen 02/22/2019 FINDINGS: Peg tube is in place. Bowel gas pattern is unremarkable. There is no obstruction or free air. Vascular calcifications are noted. Degenerative changes are present in the hips. IMPRESSION: 1.  Normal bowel gas pattern.  No obstruction or ileus. Electronically Signed   By: Marin Robertshristopher  Mattern M.D.   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 Acute on chronic respiratory failure with hypoxia patient continues to do well on trach collar.  Continue aggressive pulmonary toilet and secretion management as well as supportive measures. 2. Status epilepticus no active seizures 3. Aspiration pneumonia treated improving at this time 4. Anoxic encephalopathy grossly unchanged continue supportive measures   I have personally seen and evaluated the patient, evaluated laboratory and imaging results, formulated the assessment and plan and placed orders. The Patient requires high complexity decision making for assessment and support.  Case was discussed on Rounds with the Respiratory Therapy Staff  Yevonne PaxSaadat A Salome Cozby, MD [facility] Pulmonary Critical Care Medicine Sleep Medicine

## 2019-02-28 LAB — CBC
HCT: 28.4 % — ABNORMAL LOW (ref 36.0–46.0)
Hemoglobin: 9.3 g/dL — ABNORMAL LOW (ref 12.0–15.0)
MCH: 29.2 pg (ref 26.0–34.0)
MCHC: 32.7 g/dL (ref 30.0–36.0)
MCV: 89.3 fL (ref 80.0–100.0)
Platelets: 281 10*3/uL (ref 150–400)
RBC: 3.18 MIL/uL — ABNORMAL LOW (ref 3.87–5.11)
RDW: 15.8 % — ABNORMAL HIGH (ref 11.5–15.5)
WBC: 6.2 10*3/uL (ref 4.0–10.5)
nRBC: 0 % (ref 0.0–0.2)

## 2019-02-28 LAB — BASIC METABOLIC PANEL
Anion gap: 15 (ref 5–15)
BUN: 29 mg/dL — ABNORMAL HIGH (ref 8–23)
CO2: 23 mmol/L (ref 22–32)
Calcium: 10.5 mg/dL — ABNORMAL HIGH (ref 8.9–10.3)
Chloride: 100 mmol/L (ref 98–111)
Creatinine, Ser: 1.22 mg/dL — ABNORMAL HIGH (ref 0.44–1.00)
GFR calc Af Amer: 53 mL/min — ABNORMAL LOW (ref >60)
GFR calc non Af Amer: 46 mL/min — ABNORMAL LOW (ref >60)
Glucose, Bld: 127 mg/dL — ABNORMAL HIGH (ref 70–99)
Potassium: 3.7 mmol/L (ref 3.5–5.1)
Sodium: 138 mmol/L (ref 135–145)

## 2019-02-28 LAB — PHOSPHORUS: Phosphorus: 4.6 mg/dL (ref 2.5–4.6)

## 2019-02-28 LAB — C-REACTIVE PROTEIN: CRP: 33 mg/dL — ABNORMAL HIGH (ref <1.0)

## 2019-02-28 LAB — MAGNESIUM: Magnesium: 2 mg/dL (ref 1.7–2.4)

## 2019-02-28 LAB — SEDIMENTATION RATE: Sed Rate: 135 mm/hr — ABNORMAL HIGH (ref 0–22)

## 2019-02-28 LAB — PROCALCITONIN: Procalcitonin: 0.55 ng/mL

## 2019-02-28 NOTE — Progress Notes (Addendum)
[facility] [place]   PULMONARY CRITICAL CARE SERVICE  PROGRESS NOTE  Date of Service: 02/28/2019  Erika Wang Sherry  [id number]  DOB: [birth year]   DOA: 01/31/2019  Referring Physician: Carron CurieAli Hijazi, MD  HPI: Erika Wang Hardigree is a 68 y.o. female seen for follow up of Acute on Chronic Respiratory Failure.  Patient remains on aerosol trach collar 21% FiO2 which is more than PMV without difficulty Urine urine  Medications: Reviewed on Rounds  Physical Exam:  Vitals: Pulse -year-old 9 respirations 20 BP 159 O2 sat 99% temp 7.4  Ventilator Settings 21% ATC  . General: Comfortable at this time . Eyes: Grossly normal lids, irises & conjunctiva . ENT: grossly tongue is normal . Neck: no obvious mass . Cardiovascular: S1 S2 normal no gallop . Respiratory: No rales or rhonchi noted  . Abdomen: soft . Skin: no rash seen on limited exam . Musculoskeletal: not rigid . Psychiatric:unable to assess . Neurologic: no seizure no involuntary movements         Lab Data:   Basic Metabolic Panel: Recent Labs  Lab 02/26/19 0505 02/28/19 0508  NA 138 138  K 4.1 3.7  CL 101 100  CO2 24 23  GLUCOSE 179* 127*  BUN 35* 29*  CREATININE 0.99 1.22*  CALCIUM 10.8* 10.5*  MG 2.1 2.0  PHOS 3.7 4.6    ABG: No results for input(s): PHART, PCO2ART, PO2ART, HCO3, O2SAT in the last 168 hours.  Liver Function Tests: No results for input(s): AST, ALT, ALKPHOS, BILITOT, PROT, ALBUMIN in the last 168 hours. No results for input(s): LIPASE, AMYLASE in the last 168 hours. No results for input(s): AMMONIA in the last 168 hours.  CBC: Recent Labs  Lab 02/26/19 0505 02/28/19 0508  WBC 7.5 6.2  HGB 10.4* 9.3*  HCT 32.5* 28.4*  MCV 91.5 89.3  PLT 276 281    Cardiac Enzymes: No results for input(s): CKTOTAL, CKMB, CKMBINDEX, TROPONINI in the last 168 hours.  BNP (last 3 results) No results for input(s): BNP in the last 8760 hours.  ProBNP (last 3  results) No results for input(s): PROBNP in the last 8760 hours.  Radiological Exams: Ct Abdomen Pelvis W Contrast  Result Date: 02/26/2019 CLINICAL DATA:  [age] year old female ?PEG position, abdominal wall cellulitis?Marland Kitchen. EXAM: CT ABDOMEN AND PELVIS WITH CONTRAST TECHNIQUE: Multidetector CT imaging of the abdomen and pelvis was performed using the standard protocol following bolus administration of intravenous contrast. CONTRAST:  100mL OMNIPAQUE IOHEXOL 300 MG/ML  SOLN COMPARISON:  CT Abdomen and Pelvis without contrast 02/03/2019. FINDINGS: Lower chest: Confluent bilateral lower lobe atelectasis. Left lower lobe consolidation has largely resolved. Mild cardiomegaly. No pericardial or pleural effusion. Soft and calcified aortic atherosclerosis. Calcified coronary artery atherosclerosis and/or stent. Hepatobiliary: Negative liver and gallbladder. Pancreas: Negative. Spleen: Negative. Adrenals/Urinary Tract: Normal adrenal glands. Right side delayed nephrogram and hydronephrosis, although the right UPJ is indistinct and the obstructing etiology is unclear. There is no right hydroureter. Mild respiratory motion at the right kidney. No renal abscess or perinephric fluid. Contralateral left renal enhancement and contrast excretion appears within normal limits. Proximal left ureter is decompressed. Mildly distended but otherwise unremarkable urinary bladder. Numerous pelvic phleboliths. Stomach/Bowel: Incontinence of stool at the rectum which otherwise appears negative. Extensive diverticulosis of the large bowel from the hepatic flexure to the sigmoid. No active inflammation identified. Mildly increased retained stool. Oral contrast has reached the cecum. The cecum is in the midline on a lax mesentery. Normal appendix on coronal  image 31. No dilated or abnormal small bowel. Peg tube in place in the left upper quadrant on series 3, image 29. There is regional mild rectus muscle thickening and subcutaneous stranding  (image 25). Negative stomach. No other adverse features. No free air, free fluid. Vascular/Lymphatic: Aortoiliac calcified atherosclerosis. Major arterial structures are patent. Portal venous system is patent. No lymphadenopathy. Reproductive: Smooth hyperdense right labial cyst or mass appears stable and is probably incidental (series 3, image 78). Otherwise negative. Other: No pelvic free fluid. Musculoskeletal: Stable mild L1 compression fracture. Osteopenia. Gas containing sacral decubitus wound with progression since 02/03/2019. This overlies the S4 level and the S3 through S5 sacral segments now appear sclerotic and somewhat indistinct. Coccygeal segments are stable. No associated fluid collection. No other acute osseous abnormality identified. IMPRESSION: 1. Gas containing sacral decubitus wound with progression since 02/02/17, and the S3 through S5 sacral segments have become sclerotic and indistinct compatible with active sacral OSTEOMYELITIS. 2. Mild soft tissue inflammation about the PEG tube compatible with Cellulitis, but satisfactory PEG position and no other adverse features. 3. Obstructive Uropathy on the Right at the level of the UPJ, but the obstructing etiology is unclear. If renal function is threatened consider percutaneous decompression of the kidney. 4. No other acute or inflammatory process identified in the abdomen or pelvis. 5. Improved lung base ventilation with residual atelectasis. 6.  Aortic Atherosclerosis (ICD10-I70.0). 7. Extensive large bowel diverticulosis. Electronically Signed   By: Odessa Fleming M.D.   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 Acute on chronic respiratory failure with hypoxia patient continues to do well on trach collar with room air.  Using  PMV without difficulty.  Continue pulmonary toilet and supportive measures 2. Status epilepticus no active issues 3. Aspiration pneumonia treated improving at this time 4. Anoxic encephalopathy grossly unchanged continue supportive measures   I have personally seen and evaluated the patient, evaluated laboratory and imaging results, formulated the assessment and plan and placed orders. The Patient requires high complexity decision making for assessment and support.  Case was discussed on Rounds with the Respiratory Therapy Staff  Yevonne Pax, MD [facility] Pulmonary Critical Care Medicine Sleep Medicine

## 2019-03-01 NOTE — Progress Notes (Addendum)
Pulmonary Critical Care Medicine [facility] [place]   PULMONARY CRITICAL CARE SERVICE  PROGRESS NOTE  Date of Service: 03/01/2019  Erika Wang  EPP:[id number]  DOB: [birth year]   DOA: 01/31/2019  Referring Physician: Carron Curie, MD  HPI: Erika Wang is a 68 y.o. female seen for follow up of Acute on Chronic Respiratory Failure.  Patient continues on aerosol trach collar 21% FiO2.  Using PMV without difficulty.  Medications: Reviewed on Rounds  Physical Exam:  Vitals: Pulse 89 respirations 28 BP 166/88 O2 sat 100% temp 96.0  Ventilator Settings 1% ATC  . General: Comfortable at this time . Eyes: Grossly normal lids, irises & conjunctiva . ENT: grossly tongue is normal . Neck: no obvious mass . Cardiovascular: S1 S2 normal no gallop . Respiratory: No rales or rhonchi noted . Abdomen: soft . Skin: no rash seen on limited exam . Musculoskeletal: not rigid . Psychiatric:unable to assess . Neurologic: no seizure no involuntary movements         Lab Data:   Basic Metabolic Panel: Recent Labs  Lab 02/26/19 0505 02/28/19 0508  NA 138 138  K 4.1 3.7  CL 101 100  CO2 24 23  GLUCOSE 179* 127*  BUN 35* 29*  CREATININE 0.99 1.22*  CALCIUM 10.8* 10.5*  MG 2.1 2.0  PHOS 3.7 4.6    ABG: No results for input(s): PHART, PCO2ART, PO2ART, HCO3, O2SAT in the last 168 hours.  Liver Function Tests: No results for input(s): AST, ALT, ALKPHOS, BILITOT, PROT, ALBUMIN in the last 168 hours. No results for input(s): LIPASE, AMYLASE in the last 168 hours. No results for input(s): AMMONIA in the last 168 hours.  CBC: Recent Labs  Lab 02/26/19 0505 02/28/19 0508  WBC 7.5 6.2  HGB 10.4* 9.3*  HCT 32.5* 28.4*  MCV 91.5 89.3  PLT 276 281    Cardiac Enzymes: No results for input(s): CKTOTAL, CKMB, CKMBINDEX, TROPONINI in the last 168 hours.  BNP (last 3 results) No results for input(s): BNP in the last 8760 hours.  ProBNP (last 3 results) No results for  input(s): PROBNP in the last 8760 hours.  Radiological Exams: No results found.  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 Acute on chronic respiratory failure with hypoxia patient continues to do well on trach collar with room air.  Continue to use PMV without difficulty.  Continue pulmonary toilet and supportive measures 2. Status epilepticus no active seizures 3. Aspiration pneumonia treated improving at this time 4. Anoxic encephalopathy grossly unchanged continue supportive measures   I have personally seen and evaluated the patient, evaluated laboratory and imaging results, formulated the assessment and plan and placed orders. The Patient requires high complexity decision making for assessment and support.  Case was discussed on Rounds with the Respiratory Therapy Staff  Yevonne Pax, MD [facility] Pulmonary Critical Care Medicine Sleep Medicine

## 2019-03-02 NOTE — Progress Notes (Addendum)
Pulmonary Critical Care Medicine [facility] [place]   PULMONARY CRITICAL CARE SERVICE  PROGRESS NOTE  Date of Service: 03/02/2019  Erika Wang  BZM:[id number]  DOB: June 23, 1951   DOA: 01/31/2019  Referring Physician: Carron Curie, MD  HPI: Erika Wang is a 68 y.o. female seen for follow up of Acute on Chronic Respiratory Failure.  Patient continues to do well on aerosol trach collar 21% FiO2.  Using PMV without difficulty with minimal secretions noted at this time.  Medications: Reviewed on Rounds  Physical Exam:  Vitals: Pulse 64 respirations 22 BP 153/80 O2 sat 100% 96.8  Ventilator Settings 21% ATC  . General: Comfortable at this time . Eyes: Grossly normal lids, irises & conjunctiva . ENT: grossly tongue is normal . Neck: no obvious mass . Cardiovascular: S1 S2 normal no gallop . Respiratory: No rales or rhonchi noted . Abdomen: soft . Skin: no rash seen on limited exam . Musculoskeletal: not rigid . Psychiatric:unable to assess . Neurologic: no seizure no involuntary movements         Lab Data:   Basic Metabolic Panel: Recent Labs  Lab 02/26/19 0505 02/28/19 0508  NA 138 138  K 4.1 3.7  CL 101 100  CO2 24 23  GLUCOSE 179* 127*  BUN 35* 29*  CREATININE 0.99 1.22*  CALCIUM 10.8* 10.5*  MG 2.1 2.0  PHOS 3.7 4.6    ABG: No results for input(s): PHART, PCO2ART, PO2ART, HCO3, O2SAT in the last 168 hours.  Liver Function Tests: No results for input(s): AST, ALT, ALKPHOS, BILITOT, PROT, ALBUMIN in the last 168 hours. No results for input(s): LIPASE, AMYLASE in the last 168 hours. No results for input(s): AMMONIA in the last 168 hours.  CBC: Recent Labs  Lab 02/26/19 0505 02/28/19 0508  WBC 7.5 6.2  HGB 10.4* 9.3*  HCT 32.5* 28.4*  MCV 91.5 89.3  PLT 276 281    Cardiac Enzymes: No results for input(s): CKTOTAL, CKMB, CKMBINDEX, TROPONINI in the last 168 hours.  BNP (last 3 results) No results for input(s): BNP in the last 8760  hours.  ProBNP (last 3 results) No results for input(s): PROBNP in the last 8760 hours.  Radiological Exams: No results found.  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 Acute on chronic respiratory failure with hypoxia doing well on trach collar with room air.  Continue use PMV as tolerated.  Continue supportive measures 2. Status epilepticus no active seizures 3. Aspiration pneumonia treated improving at this time 4. Anoxic encephalopathy grossly unchanged continue supportive measures   I have personally seen and evaluated the patient, evaluated laboratory and imaging results, formulated the assessment and plan and placed orders. The Patient requires high complexity decision making for assessment and support.  Case was discussed on Rounds with the Respiratory Therapy Staff  Yevonne Pax, MD [facility] Pulmonary Critical Care Medicine Sleep Medicine

## 2019-03-03 NOTE — Progress Notes (Addendum)
Pulmonary Critical Care Medicine [facility] [place]   PULMONARY CRITICAL CARE SERVICE  PROGRESS NOTE  Date of Service: 03/03/2019  Erika Wang  [id number]  DOB: 1951/07/15   DOA: 01/31/2019  Referring Physician: Carron Curie, MD  HPI: Erika Wang is a 68 y.o. female seen for follow up of Acute on Chronic Respiratory Failure.  Patient continues to do well on 21% aerosol trach collar using PMV without difficulty.  She continues to not be able to be capped without some difficulty.  Medications: Reviewed on Rounds  Physical Exam:  Vitals: Pulse 59 respirations 19 BP 113/76 O2 sat 100% temp 96.4  Ventilator Settings 21% ATC  . General: Comfortable at this time . Eyes: Grossly normal lids, irises & conjunctiva . ENT: grossly tongue is normal . Neck: no obvious mass . Cardiovascular: S1 S2 normal no gallop . Respiratory: No rales or rhonchi noted . Abdomen: soft . Skin: no rash seen on limited exam . Musculoskeletal: not rigid . Psychiatric:unable to assess . Neurologic: no seizure no involuntary movements         Lab Data:   Basic Metabolic Panel: Recent Labs  Lab 02/26/19 0505 02/28/19 0508  NA 138 138  K 4.1 3.7  CL 101 100  CO2 24 23  GLUCOSE 179* 127*  BUN 35* 29*  CREATININE 0.99 1.22*  CALCIUM 10.8* 10.5*  MG 2.1 2.0  PHOS 3.7 4.6    ABG: No results for input(s): PHART, PCO2ART, PO2ART, HCO3, O2SAT in the last 168 hours.  Liver Function Tests: No results for input(s): AST, ALT, ALKPHOS, BILITOT, PROT, ALBUMIN in the last 168 hours. No results for input(s): LIPASE, AMYLASE in the last 168 hours. No results for input(s): AMMONIA in the last 168 hours.  CBC: Recent Labs  Lab 02/26/19 0505 02/28/19 0508  WBC 7.5 6.2  HGB 10.4* 9.3*  HCT 32.5* 28.4*  MCV 91.5 89.3  PLT 276 281    Cardiac Enzymes: No results for input(s): CKTOTAL, CKMB, CKMBINDEX, TROPONINI in the last 168 hours.  BNP (last 3 results) No results for input(s):  BNP in the last 8760 hours.  ProBNP (last 3 results) No results for input(s): PROBNP in the last 8760 hours.  Radiological Exams: No results found.  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 Acute on chronic respiratory failure with hypoxia continue with trach collar and PMV use.  Continue supportive measures 2. Assessment has no active seizures 3. Aspiration pneumonia treated improving at this time 4. Anoxic encephalopathy grossly unchanged continue supportive measures   I have personally seen and evaluated the patient, evaluated laboratory and imaging results, formulated the assessment and plan and placed orders. The Patient requires high complexity decision making for assessment and support.  Case was discussed on Rounds with the Respiratory Therapy Staff  Yevonne Pax, MD [facility] Pulmonary Critical Care Medicine Sleep Medicine

## 2019-03-04 LAB — PHENYTOIN LEVEL, TOTAL: Phenytoin Lvl: 15 ug/mL (ref 10.0–20.0)

## 2019-03-04 LAB — CBC
HCT: 30.6 % — ABNORMAL LOW (ref 36.0–46.0)
Hemoglobin: 9.6 g/dL — ABNORMAL LOW (ref 12.0–15.0)
MCH: 29.2 pg (ref 26.0–34.0)
MCHC: 31.4 g/dL (ref 30.0–36.0)
MCV: 93 fL (ref 80.0–100.0)
Platelets: 300 10*3/uL (ref 150–400)
RBC: 3.29 MIL/uL — ABNORMAL LOW (ref 3.87–5.11)
RDW: 16.2 % — ABNORMAL HIGH (ref 11.5–15.5)
WBC: 4.7 10*3/uL (ref 4.0–10.5)
nRBC: 0 % (ref 0.0–0.2)

## 2019-03-04 LAB — BASIC METABOLIC PANEL
Anion gap: 14 (ref 5–15)
BUN: 18 mg/dL (ref 8–23)
CO2: 18 mmol/L — ABNORMAL LOW (ref 22–32)
Calcium: 10.1 mg/dL (ref 8.9–10.3)
Chloride: 108 mmol/L (ref 98–111)
Creatinine, Ser: 1.31 mg/dL — ABNORMAL HIGH (ref 0.44–1.00)
GFR calc Af Amer: 49 mL/min — ABNORMAL LOW (ref >60)
GFR calc non Af Amer: 42 mL/min — ABNORMAL LOW (ref >60)
Glucose, Bld: 136 mg/dL — ABNORMAL HIGH (ref 70–99)
Potassium: 3.5 mmol/L (ref 3.5–5.1)
Sodium: 140 mmol/L (ref 135–145)

## 2019-03-04 NOTE — Progress Notes (Addendum)
Pulmonary Critical Care Medicine [facility] [place]   PULMONARY CRITICAL CARE SERVICE  PROGRESS NOTE  Date of Service: 03/04/2019  Erika Wang  [id number]  DOB: Feb 07, 1951   DOA: 01/31/2019  Referring Physician: Carron Curie, MD  HPI: Erika Wang is a 68 y.o. female seen for follow up of Acute on Chronic Respiratory Failure.  Patient remains on aerosol trach collar 21% FiO2.  Using PMV without difficulty.  Medications: Reviewed on Rounds  Physical Exam:  Vitals: Pulse 82 respirations 17 BP 131/78 O2 sat 98% temp 96.1  Ventilator Settings ATC 21%  . General: Comfortable at this time . Eyes: Grossly normal lids, irises & conjunctiva . ENT: grossly tongue is normal . Neck: no obvious mass . Cardiovascular: S1 S2 normal no gallop . Respiratory: No rales or rhonchi noted . Abdomen: soft . Skin: no rash seen on limited exam . Musculoskeletal: not rigid . Psychiatric:unable to assess . Neurologic: no seizure no involuntary movements         Lab Data:   Basic Metabolic Panel: Recent Labs  Lab 02/26/19 0505 02/28/19 0508 03/04/19 0505  NA 138 138 140  K 4.1 3.7 3.5  CL 101 100 108  CO2 24 23 18*  GLUCOSE 179* 127* 136*  BUN 35* 29* 18  CREATININE 0.99 1.22* 1.31*  CALCIUM 10.8* 10.5* 10.1  MG 2.1 2.0  --   PHOS 3.7 4.6  --     ABG: No results for input(s): PHART, PCO2ART, PO2ART, HCO3, O2SAT in the last 168 hours.  Liver Function Tests: No results for input(s): AST, ALT, ALKPHOS, BILITOT, PROT, ALBUMIN in the last 168 hours. No results for input(s): LIPASE, AMYLASE in the last 168 hours. No results for input(s): AMMONIA in the last 168 hours.  CBC: Recent Labs  Lab 02/26/19 0505 02/28/19 0508 03/04/19 0505  WBC 7.5 6.2 4.7  HGB 10.4* 9.3* 9.6*  HCT 32.5* 28.4* 30.6*  MCV 91.5 89.3 93.0  PLT 276 281 300    Cardiac Enzymes: No results for input(s): CKTOTAL, CKMB, CKMBINDEX, TROPONINI in the last 168 hours.  BNP (last 3  results) No results for input(s): BNP in the last 8760 hours.  ProBNP (last 3 results) No results for input(s): PROBNP in the last 8760 hours.  Radiological Exams: No results found.  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 Acute on chronic respiratory failure with hypoxia continue with trach collar and PMV as before.  Continue supportive measures 2. Status epilepticus no active seizures 3. Aspiration pneumonia treated improving at this time 4. Anoxic encephalopathy grossly unchanged continue supportive measures   I have personally seen and evaluated the patient, evaluated laboratory and imaging results, formulated the assessment and plan and placed orders. The Patient requires high complexity decision making for assessment and support.  Case was discussed on Rounds with the Respiratory Therapy Staff  Yevonne Pax, MD [facility] Pulmonary Critical Care Medicine Sleep Medicine

## 2019-03-05 LAB — BASIC METABOLIC PANEL
Anion gap: 13 (ref 5–15)
BUN: 12 mg/dL (ref 8–23)
CO2: 19 mmol/L — ABNORMAL LOW (ref 22–32)
Calcium: 10 mg/dL (ref 8.9–10.3)
Chloride: 109 mmol/L (ref 98–111)
Creatinine, Ser: 1.38 mg/dL — ABNORMAL HIGH (ref 0.44–1.00)
GFR calc Af Amer: 46 mL/min — ABNORMAL LOW (ref >60)
GFR calc non Af Amer: 40 mL/min — ABNORMAL LOW (ref >60)
Glucose, Bld: 119 mg/dL — ABNORMAL HIGH (ref 70–99)
Potassium: 3.4 mmol/L — ABNORMAL LOW (ref 3.5–5.1)
Sodium: 141 mmol/L (ref 135–145)

## 2019-03-05 NOTE — Progress Notes (Addendum)
Pulmonary Critical Care Medicine [facility] [place]   PULMONARY CRITICAL CARE SERVICE  PROGRESS NOTE  Date of Service: 03/05/2019  Erika Wang  [id number]  DOB: August 24, 1951   DOA: 01/31/2019  Referring Physician: Carron Curie, MD  HPI: Erika Wang is a 68 y.o. female seen for follow up of Acute on Chronic Respiratory Failure.  Patient continues on 21% aerosol trach collar using PMV with no difficulty.  Satting well with no distress.  Medications: Reviewed on Rounds  Physical Exam:  Vitals: Pulse 69 respiration 16 BP 183/98 O2 sat 100% 97.7  Ventilator Settings ATC 21%  . General: Comfortable at this time . Eyes: Grossly normal lids, irises & conjunctiva . ENT: grossly tongue is normal . Neck: no obvious mass . Cardiovascular: S1 S2 normal no gallop . Respiratory: No rales or rhonchi noted . Abdomen: soft . Skin: no rash seen on limited exam . Musculoskeletal: not rigid . Psychiatric:unable to assess . Neurologic: no seizure no involuntary movements         Lab Data:   Basic Metabolic Panel: Recent Labs  Lab 02/28/19 0508 03/04/19 0505 03/05/19 0533  NA 138 140 141  K 3.7 3.5 3.4*  CL 100 108 109  CO2 23 18* 19*  GLUCOSE 127* 136* 119*  BUN 29* 18 12  CREATININE 1.22* 1.31* 1.38*  CALCIUM 10.5* 10.1 10.0  MG 2.0  --   --   PHOS 4.6  --   --     ABG: No results for input(s): PHART, PCO2ART, PO2ART, HCO3, O2SAT in the last 168 hours.  Liver Function Tests: No results for input(s): AST, ALT, ALKPHOS, BILITOT, PROT, ALBUMIN in the last 168 hours. No results for input(s): LIPASE, AMYLASE in the last 168 hours. No results for input(s): AMMONIA in the last 168 hours.  CBC: Recent Labs  Lab 02/28/19 0508 03/04/19 0505  WBC 6.2 4.7  HGB 9.3* 9.6*  HCT 28.4* 30.6*  MCV 89.3 93.0  PLT 281 300    Cardiac Enzymes: No results for input(s): CKTOTAL, CKMB, CKMBINDEX, TROPONINI in the last 168 hours.  BNP (last 3 results) No results for  input(s): BNP in the last 8760 hours.  ProBNP (last 3 results) No results for input(s): PROBNP in the last 8760 hours.  Radiological Exams: No results found.  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 Acute on chronic respiratory failure with hypoxia continue with trach collar and PMV at this time.  Daily supportive measures and pulmonary toilet 2. Status epilepticus no active seizures 3. Aspiration pneumonia treated improving at this time 4. Anoxic encephalopathy grossly unchanged continue supportive measures   I have personally seen and evaluated the patient, evaluated laboratory and imaging results, formulated the assessment and plan and placed orders. The Patient requires high complexity decision making for assessment and support.  Case was discussed on Rounds with the Respiratory Therapy Staff  Yevonne Pax, MD [facility] Pulmonary Critical Care Medicine Sleep Medicine

## 2019-03-06 ENCOUNTER — Other Ambulatory Visit (HOSPITAL_COMMUNITY): Payer: Medicare HMO

## 2019-03-06 LAB — RENAL FUNCTION PANEL
Albumin: 1.7 g/dL — ABNORMAL LOW (ref 3.5–5.0)
Anion gap: 14 (ref 5–15)
BUN: 7 mg/dL — ABNORMAL LOW (ref 8–23)
CO2: 22 mmol/L (ref 22–32)
Calcium: 9.1 mg/dL (ref 8.9–10.3)
Chloride: 106 mmol/L (ref 98–111)
Creatinine, Ser: 1.31 mg/dL — ABNORMAL HIGH (ref 0.44–1.00)
GFR calc Af Amer: 49 mL/min — ABNORMAL LOW (ref >60)
GFR calc non Af Amer: 42 mL/min — ABNORMAL LOW (ref >60)
Glucose, Bld: 113 mg/dL — ABNORMAL HIGH (ref 70–99)
Phosphorus: 2.6 mg/dL (ref 2.5–4.6)
Potassium: 3.1 mmol/L — ABNORMAL LOW (ref 3.5–5.1)
Sodium: 142 mmol/L (ref 135–145)

## 2019-03-06 LAB — OCCULT BLOOD X 1 CARD TO LAB, STOOL: Fecal Occult Bld: POSITIVE — AB

## 2019-03-06 LAB — CBC
HCT: 27.3 % — ABNORMAL LOW (ref 36.0–46.0)
Hemoglobin: 8.8 g/dL — ABNORMAL LOW (ref 12.0–15.0)
MCH: 28.8 pg (ref 26.0–34.0)
MCHC: 32.2 g/dL (ref 30.0–36.0)
MCV: 89.2 fL (ref 80.0–100.0)
Platelets: 273 10*3/uL (ref 150–400)
RBC: 3.06 MIL/uL — ABNORMAL LOW (ref 3.87–5.11)
RDW: 16.1 % — ABNORMAL HIGH (ref 11.5–15.5)
WBC: 4.9 10*3/uL (ref 4.0–10.5)
nRBC: 0 % (ref 0.0–0.2)

## 2019-03-06 LAB — LEVETIRACETAM LEVEL: Levetiracetam Lvl: 7.1 ug/mL — ABNORMAL LOW (ref 10.0–40.0)

## 2019-03-06 MED ORDER — IOHEXOL 300 MG/ML  SOLN
50.0000 mL | Freq: Once | INTRAMUSCULAR | Status: AC | PRN
  Administered 2019-03-06: 50 mL via INTRATHECAL

## 2019-03-06 NOTE — Progress Notes (Addendum)
Pulmonary Critical Care Medicine [facility] [place]   PULMONARY CRITICAL CARE SERVICE  PROGRESS NOTE  Date of Service: 03/06/2019  Erika Wang  [id number]  DOB: [birth year]   DOA: 01/31/2019  Referring Physician: Carron Curie, MD  HPI: Erika Wang is a 68 y.o. female seen for follow up of Acute on Chronic Respiratory Failure.  Patient continues on 21% aerosol trach collar.  Using PMV without difficulty.  Medications: Reviewed on Rounds  Physical Exam:  Vitals: Pulse 84 respirations 14 BP 141/91 O2 sat 100% temp 96.8  Ventilator Settings 21% aerosol trach collar  . General: Comfortable at this time . Eyes: Grossly normal lids, irises & conjunctiva . ENT: grossly tongue is normal . Neck: no obvious mass . Cardiovascular: S1 S2 normal no gallop . Respiratory: No rales or rhonchi noted . Abdomen: soft . Skin: no rash seen on limited exam . Musculoskeletal: not rigid . Psychiatric:unable to assess . Neurologic: no seizure no involuntary movements         Lab Data:   Basic Metabolic Panel: Recent Labs  Lab 02/28/19 0508 03/04/19 0505 03/05/19 0533 03/06/19 0559  NA 138 140 141 142  K 3.7 3.5 3.4* 3.1*  CL 100 108 109 106  CO2 23 18* 19* 22  GLUCOSE 127* 136* 119* 113*  BUN 29* 18 12 7*  CREATININE 1.22* 1.31* 1.38* 1.31*  CALCIUM 10.5* 10.1 10.0 9.1  MG 2.0  --   --   --   PHOS 4.6  --   --  2.6    ABG: No results for input(s): PHART, PCO2ART, PO2ART, HCO3, O2SAT in the last 168 hours.  Liver Function Tests: Recent Labs  Lab 03/06/19 0559  ALBUMIN 1.7*   No results for input(s): LIPASE, AMYLASE in the last 168 hours. No results for input(s): AMMONIA in the last 168 hours.  CBC: Recent Labs  Lab 02/28/19 0508 03/04/19 0505 03/06/19 0559  WBC 6.2 4.7 4.9  HGB 9.3* 9.6* 8.8*  HCT 28.4* 30.6* 27.3*  MCV 89.3 93.0 89.2  PLT 281 300 273    Cardiac Enzymes: No results for input(s): CKTOTAL, CKMB, CKMBINDEX, TROPONINI in the last  168 hours.  BNP (last 3 results) No results for input(s): BNP in the last 8760 hours.  ProBNP (last 3 results) No results for input(s): PROBNP in the last 8760 hours.  Radiological Exams: No results found.  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 Acute on chronic respiratory failure with hypoxia continue with trach collar and PMV at this time.  Continue supportive measures and pulmonary toilet 2. Status epilepticus no active seizures 3. Aspiration pneumonia treated improving at this time 4. Anoxic encephalopathy grossly unchanged continue supportive measures   I have personally seen and evaluated the patient, evaluated laboratory and imaging results, formulated the assessment and plan and placed orders. The Patient requires high complexity decision making for assessment and support.  Case was discussed on Rounds with the Respiratory Therapy Staff  Yevonne Pax, MD [facility] Pulmonary Critical Care Medicine Sleep Medicine

## 2019-03-07 LAB — BASIC METABOLIC PANEL
Anion gap: 11 (ref 5–15)
BUN: 5 mg/dL — ABNORMAL LOW (ref 8–23)
CO2: 22 mmol/L (ref 22–32)
Calcium: 9.4 mg/dL (ref 8.9–10.3)
Chloride: 109 mmol/L (ref 98–111)
Creatinine, Ser: 1.22 mg/dL — ABNORMAL HIGH (ref 0.44–1.00)
GFR calc Af Amer: 53 mL/min — ABNORMAL LOW (ref >60)
GFR calc non Af Amer: 46 mL/min — ABNORMAL LOW (ref >60)
Glucose, Bld: 120 mg/dL — ABNORMAL HIGH (ref 70–99)
Potassium: 3.8 mmol/L (ref 3.5–5.1)
Sodium: 142 mmol/L (ref 135–145)

## 2019-03-07 LAB — CBC
HCT: 27.8 % — ABNORMAL LOW (ref 36.0–46.0)
Hemoglobin: 9.3 g/dL — ABNORMAL LOW (ref 12.0–15.0)
MCH: 29.6 pg (ref 26.0–34.0)
MCHC: 33.5 g/dL (ref 30.0–36.0)
MCV: 88.5 fL (ref 80.0–100.0)
Platelets: 253 10*3/uL (ref 150–400)
RBC: 3.14 MIL/uL — ABNORMAL LOW (ref 3.87–5.11)
RDW: 16 % — ABNORMAL HIGH (ref 11.5–15.5)
WBC: 4.4 10*3/uL (ref 4.0–10.5)
nRBC: 0 % (ref 0.0–0.2)

## 2019-03-07 NOTE — Progress Notes (Addendum)
Pulmonary Critical Care Medicine [facility] [place]   PULMONARY CRITICAL CARE SERVICE  PROGRESS NOTE  Date of Service: 03/07/2019  Erika Wang  [id number]:[id number]  DOB: 1951-01-28   DOA: 01/31/2019  Referring Physician: Carron Curie, MD  HPI: Erika Wang is a 68 y.o. female seen for follow up of Acute on Chronic Respiratory Failure.  Patient remains on 21% aerosol trach collar using PMV with minimal secretions.  Medications: Reviewed on Rounds  Physical Exam:  Vitals: Pulse 89 respirations 16 BP 110/50 O2 sat 98% temp 96.7  Ventilator Settings 1% ATC  . General: Comfortable at this time . Eyes: Grossly normal lids, irises & conjunctiva . ENT: grossly tongue is normal . Neck: no obvious mass . Cardiovascular: S1 S2 normal no gallop . Respiratory: No rales or rhonchi noted . Abdomen: soft . Skin: no rash seen on limited exam . Musculoskeletal: not rigid . Psychiatric:unable to assess . Neurologic: no seizure no involuntary movements         Lab Data:   Basic Metabolic Panel: Recent Labs  Lab 03/04/19 0505 03/05/19 0533 03/06/19 0559 03/07/19 0328  NA 140 141 142 142  K 3.5 3.4* 3.1* 3.8  CL 108 109 106 109  CO2 18* 19* 22 22  GLUCOSE 136* 119* 113* 120*  BUN 18 12 7* 5*  CREATININE 1.31* 1.38* 1.31* 1.22*  CALCIUM 10.1 10.0 9.1 9.4  PHOS  --   --  2.6  --     ABG: No results for input(s): PHART, PCO2ART, PO2ART, HCO3, O2SAT in the last 168 hours.  Liver Function Tests: Recent Labs  Lab 03/06/19 0559  ALBUMIN 1.7*   No results for input(s): LIPASE, AMYLASE in the last 168 hours. No results for input(s): AMMONIA in the last 168 hours.  CBC: Recent Labs  Lab 03/04/19 0505 03/06/19 0559 03/07/19 0328  WBC 4.7 4.9 4.4  HGB 9.6* 8.8* 9.3*  HCT 30.6* 27.3* 27.8*  MCV 93.0 89.2 88.5  PLT 300 273 253    Cardiac Enzymes: No results for input(s): CKTOTAL, CKMB, CKMBINDEX, TROPONINI in the last 168 hours.  BNP (last 3 results) No  results for input(s): BNP in the last 8760 hours.  ProBNP (last 3 results) No results for input(s): PROBNP in the last 8760 hours.  Radiological Exams: Dg Abdomen Peg Tube Location  Result Date: 03/06/2019 CLINICAL DATA:  Gastrostomy tube placement. EXAM: ABDOMEN - 1 VIEW COMPARISON:  None. FINDINGS: The bowel gas pattern is normal. Gastrostomy tube tip is within the gastric lumen. Normal contrast filling of the stomach is noted. No leakage or extravasation is noted. No radio-opaque calculi or other significant radiographic abnormality are seen. IMPRESSION: Gastrostomy tube tip appears to be well positioned within gastric lumen. Electronically Signed   By: Lupita Raider M.D.   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 Acute on chronic respiratory failure with hypoxia continue with trach collar and PMV as tolerated.  Continue supportive measures and pulmonary toilet 2. Status epilepticus no active seizures 3. Aspiration pneumonia treated improving at this time 4. Anoxic encephalopathy grossly unchanged continue supportive measures   I have personally seen and evaluated the patient, evaluated laboratory and imaging results, formulated the assessment and plan and placed orders. The Patient requires high complexity decision making for  assessment and support.  Case was discussed on Rounds with the Respiratory Therapy Staff  Allyne Gee, MD [facility] Pulmonary Critical Care Medicine Sleep Medicine

## 2019-03-08 NOTE — Progress Notes (Addendum)
Pulmonary Critical Care Medicine [facility] [place]   PULMONARY CRITICAL CARE SERVICE  PROGRESS NOTE  Date of Service: 03/08/2019  Erika Wang  GOT:[id number]  DOB: 1951/06/01   DOA: 01/31/2019  Referring Physician: Carron Curie, MD  HPI: Erika Wang is a 68 y.o. female seen for follow up of Acute on Chronic Respiratory Failure.  Patient remains on aerosol trach collar 21% FiO2 using PMV with no difficulty.  Medications: Reviewed on Rounds  Physical Exam:  Vitals: Pulse 84 respirations 16 BP 139/79 O2 sat 100% temp 95.0  Ventilator Settings 21% ATC  . General: Comfortable at this time . Eyes: Grossly normal lids, irises & conjunctiva . ENT: grossly tongue is normal . Neck: no obvious mass . Cardiovascular: S1 S2 normal no gallop . Respiratory: No rales or rhonchi noted . Abdomen: soft . Skin: no rash seen on limited exam . Musculoskeletal: not rigid . Psychiatric:unable to assess . Neurologic: no seizure no involuntary movements         Lab Data:   Basic Metabolic Panel: Recent Labs  Lab 03/04/19 0505 03/05/19 0533 03/06/19 0559 03/07/19 0328  NA 140 141 142 142  K 3.5 3.4* 3.1* 3.8  CL 108 109 106 109  CO2 18* 19* 22 22  GLUCOSE 136* 119* 113* 120*  BUN 18 12 7* 5*  CREATININE 1.31* 1.38* 1.31* 1.22*  CALCIUM 10.1 10.0 9.1 9.4  PHOS  --   --  2.6  --     ABG: No results for input(s): PHART, PCO2ART, PO2ART, HCO3, O2SAT in the last 168 hours.  Liver Function Tests: Recent Labs  Lab 03/06/19 0559  ALBUMIN 1.7*   No results for input(s): LIPASE, AMYLASE in the last 168 hours. No results for input(s): AMMONIA in the last 168 hours.  CBC: Recent Labs  Lab 03/04/19 0505 03/06/19 0559 03/07/19 0328  WBC 4.7 4.9 4.4  HGB 9.6* 8.8* 9.3*  HCT 30.6* 27.3* 27.8*  MCV 93.0 89.2 88.5  PLT 300 273 253    Cardiac Enzymes: No results for input(s): CKTOTAL, CKMB, CKMBINDEX, TROPONINI in the last 168 hours.  BNP (last 3 results) No  results for input(s): BNP in the last 8760 hours.  ProBNP (last 3 results) No results for input(s): PROBNP in the last 8760 hours.  Radiological Exams: Dg Abdomen Peg Tube Location  Result Date: 03/06/2019 CLINICAL DATA:  Gastrostomy tube placement. EXAM: ABDOMEN - 1 VIEW COMPARISON:  None. FINDINGS: The bowel gas pattern is normal. Gastrostomy tube tip is within the gastric lumen. Normal contrast filling of the stomach is noted. No leakage or extravasation is noted. No radio-opaque calculi or other significant radiographic abnormality are seen. IMPRESSION: Gastrostomy tube tip appears to be well positioned within gastric lumen. Electronically Signed   By: Lupita Raider M.D.   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 Acute on chronic respiratory failure with hypoxia continue with trach collar and PMV as tolerated.  Continue pulmonary toilet and supportive measures 2. Status epilepticus no active seizures noted 3. Aspiration pneumonia treated improving at this time 4. Anoxic encephalopathy grossly unchanged continue supportive measures   I have personally seen and evaluated the patient, evaluated laboratory and imaging results, formulated the assessment and plan and placed orders. The Patient requires high complexity decision  making for assessment and support.  Case was discussed on Rounds with the Respiratory Therapy Staff  Allyne Gee, MD [facility] Pulmonary Critical Care Medicine Sleep Medicine

## 2019-03-09 LAB — BASIC METABOLIC PANEL
Anion gap: 9 (ref 5–15)
BUN: 5 mg/dL — ABNORMAL LOW (ref 8–23)
CO2: 19 mmol/L — ABNORMAL LOW (ref 22–32)
Calcium: 8.8 mg/dL — ABNORMAL LOW (ref 8.9–10.3)
Chloride: 113 mmol/L — ABNORMAL HIGH (ref 98–111)
Creatinine, Ser: 1.09 mg/dL — ABNORMAL HIGH (ref 0.44–1.00)
GFR calc Af Amer: >60 mL/min (ref >60)
GFR calc non Af Amer: 53 mL/min — ABNORMAL LOW (ref >60)
Glucose, Bld: 97 mg/dL (ref 70–99)
Potassium: 3.7 mmol/L (ref 3.5–5.1)
Sodium: 141 mmol/L (ref 135–145)

## 2019-03-09 LAB — CBC
HCT: 28.2 % — ABNORMAL LOW (ref 36.0–46.0)
Hemoglobin: 9 g/dL — ABNORMAL LOW (ref 12.0–15.0)
MCH: 28.8 pg (ref 26.0–34.0)
MCHC: 31.9 g/dL (ref 30.0–36.0)
MCV: 90.4 fL (ref 80.0–100.0)
Platelets: 245 10*3/uL (ref 150–400)
RBC: 3.12 MIL/uL — ABNORMAL LOW (ref 3.87–5.11)
RDW: 16.7 % — ABNORMAL HIGH (ref 11.5–15.5)
WBC: 4.3 10*3/uL (ref 4.0–10.5)
nRBC: 0 % (ref 0.0–0.2)

## 2019-03-09 LAB — MAGNESIUM: Magnesium: 1.6 mg/dL — ABNORMAL LOW (ref 1.7–2.4)

## 2019-03-09 NOTE — Progress Notes (Addendum)
Pulmonary Critical Care Medicine [facility] [place]   PULMONARY CRITICAL CARE SERVICE  PROGRESS NOTE  Date of Service: 03/09/2019  [name]  XBL:[id number]  DOB: [birth year]   DOA: 01/31/2019  Referring Physician: Carron Curie, MD  HPI: Erika Wang is a 68 y.o. female seen for follow up of Acute on Chronic Respiratory Failure.  Patient has now been capped for 24 hours is doing well on room air with no distress.  Medications: Reviewed on Rounds  Physical Exam:  Vitals: Pulse 40 respirations 12 BP 150/80 O2 sat 100% temp 94.3  Ventilator Settings room air  . General: Comfortable at this time . Eyes: Grossly normal lids, irises & conjunctiva . ENT: grossly tongue is normal . Neck: no obvious mass . Cardiovascular: S1 S2 normal no gallop . Respiratory: No rales or rhonchi noted . Abdomen: soft . Skin: no rash seen on limited exam . Musculoskeletal: not rigid . Psychiatric:unable to assess . Neurologic: no seizure no involuntary movements         Lab Data:   Basic Metabolic Panel: Recent Labs  Lab 03/04/19 0505 03/05/19 0533 03/06/19 0559 03/07/19 0328 03/09/19 0643  NA 140 141 142 142 141  K 3.5 3.4* 3.1* 3.8 3.7  CL 108 109 106 109 113*  CO2 18* 19* 22 22 19*  GLUCOSE 136* 119* 113* 120* 97  BUN 18 12 7* 5* 5*  CREATININE 1.31* 1.38* 1.31* 1.22* 1.09*  CALCIUM 10.1 10.0 9.1 9.4 8.8*  MG  --   --   --   --  1.6*  PHOS  --   --  2.6  --   --     ABG: No results for input(s): PHART, PCO2ART, PO2ART, HCO3, O2SAT in the last 168 hours.  Liver Function Tests: Recent Labs  Lab 03/06/19 0559  ALBUMIN 1.7*   No results for input(s): LIPASE, AMYLASE in the last 168 hours. No results for input(s): AMMONIA in the last 168 hours.  CBC: Recent Labs  Lab 03/04/19 0505 03/06/19 0559 03/07/19 0328 03/09/19 0643  WBC 4.7 4.9 4.4 4.3  HGB 9.6* 8.8* 9.3* 9.0*  HCT 30.6* 27.3* 27.8* 28.2*  MCV 93.0 89.2 88.5 90.4  PLT 300 273 253 245     Cardiac Enzymes: No results for input(s): CKTOTAL, CKMB, CKMBINDEX, TROPONINI in the last 168 hours.  BNP (last 3 results) No results for input(s): BNP in the last 8760 hours.  ProBNP (last 3 results) No results for input(s): PROBNP in the last 8760 hours.  Radiological Exams: No results found.  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 Acute on chronic respiratory failure with hypoxia continue with capping and room air as tolerated.  Continue pulmonary toilet and supportive measures 2. Status epilepticus no active seizures noted 3. Aspiration pneumonia treated improving at this time 4. Anoxic encephalopathy grossly unchanged continue supportive measures   I have personally seen and evaluated the patient, evaluated laboratory and imaging results, formulated the assessment and plan and placed orders. The Patient requires high complexity decision making for assessment and support.  Case was discussed on Rounds with the Respiratory Therapy Staff  Yevonne Pax, MD [facility] Pulmonary Critical Care Medicine Sleep Medicine

## 2019-03-10 LAB — BASIC METABOLIC PANEL
Anion gap: 11 (ref 5–15)
BUN: 8 mg/dL (ref 8–23)
CO2: 21 mmol/L — ABNORMAL LOW (ref 22–32)
Calcium: 8.6 mg/dL — ABNORMAL LOW (ref 8.9–10.3)
Chloride: 109 mmol/L (ref 98–111)
Creatinine, Ser: 1.12 mg/dL — ABNORMAL HIGH (ref 0.44–1.00)
GFR calc Af Amer: 59 mL/min — ABNORMAL LOW (ref >60)
GFR calc non Af Amer: 51 mL/min — ABNORMAL LOW (ref >60)
Glucose, Bld: 115 mg/dL — ABNORMAL HIGH (ref 70–99)
Potassium: 2.7 mmol/L — CL (ref 3.5–5.1)
Sodium: 141 mmol/L (ref 135–145)

## 2019-03-10 LAB — CBC
HCT: 22.5 % — ABNORMAL LOW (ref 36.0–46.0)
Hemoglobin: 7.4 g/dL — ABNORMAL LOW (ref 12.0–15.0)
MCH: 29.4 pg (ref 26.0–34.0)
MCHC: 32.9 g/dL (ref 30.0–36.0)
MCV: 89.3 fL (ref 80.0–100.0)
Platelets: 219 10*3/uL (ref 150–400)
RBC: 2.52 MIL/uL — ABNORMAL LOW (ref 3.87–5.11)
RDW: 17 % — ABNORMAL HIGH (ref 11.5–15.5)
WBC: 4.8 10*3/uL (ref 4.0–10.5)
nRBC: 0 % (ref 0.0–0.2)

## 2019-03-10 LAB — MAGNESIUM: Magnesium: 2 mg/dL (ref 1.7–2.4)

## 2019-03-10 LAB — PHENYTOIN LEVEL, TOTAL: Phenytoin Lvl: 12.7 ug/mL (ref 10.0–20.0)

## 2019-03-10 LAB — LACTIC ACID, PLASMA: Lactic Acid, Venous: 0.8 mmol/L (ref 0.5–1.9)

## 2019-03-10 LAB — POTASSIUM: Potassium: 3.3 mmol/L — ABNORMAL LOW (ref 3.5–5.1)

## 2019-03-10 NOTE — Progress Notes (Addendum)
Pulmonary Critical Care Medicine [facility] [place]   PULMONARY CRITICAL CARE SERVICE  PROGRESS NOTE  Date of Service: 03/10/2019  Armani Gieseking  [id number]:[id number]  DOB: 1951-03-22   DOA: 01/31/2019  Referring Physician: Carron Curie, MD  HPI: Ladonia Pietri is a 68 y.o. female seen for follow up of Acute on Chronic Respiratory Failure.  Patient remains capped now for 48 hours and doing well.  Goal of 72 hours at this time.    Medications: Reviewed on Rounds  Physical Exam:  Vitals: Pulse 103 respirations 15 BP 162/89 O2 sat 90% temp 97.7  Ventilator Settings room air  . General: Comfortable at this time . Eyes: Grossly normal lids, irises & conjunctiva . ENT: grossly tongue is normal . Neck: no obvious mass . Cardiovascular: S1 S2 normal no gallop . Respiratory: No rales or rhonchi noted . Abdomen: soft . Skin: no rash seen on limited exam . Musculoskeletal: not rigid . Psychiatric:unable to assess . Neurologic: no seizure no involuntary movements         Lab Data:   Basic Metabolic Panel: Recent Labs  Lab 03/05/19 0533 03/06/19 0559 03/07/19 0328 03/09/19 0643 03/10/19 0612  NA 141 142 142 141 141  K 3.4* 3.1* 3.8 3.7 2.7*  CL 109 106 109 113* 109  CO2 19* 22 22 19* 21*  GLUCOSE 119* 113* 120* 97 115*  BUN 12 7* 5* 5* 8  CREATININE 1.38* 1.31* 1.22* 1.09* 1.12*  CALCIUM 10.0 9.1 9.4 8.8* 8.6*  MG  --   --   --  1.6* 2.0  PHOS  --  2.6  --   --   --     ABG: No results for input(s): PHART, PCO2ART, PO2ART, HCO3, O2SAT in the last 168 hours.  Liver Function Tests: Recent Labs  Lab 03/06/19 0559  ALBUMIN 1.7*   No results for input(s): LIPASE, AMYLASE in the last 168 hours. No results for input(s): AMMONIA in the last 168 hours.  CBC: Recent Labs  Lab 03/04/19 0505 03/06/19 0559 03/07/19 0328 03/09/19 0643 03/10/19 0612  WBC 4.7 4.9 4.4 4.3 4.8  HGB 9.6* 8.8* 9.3* 9.0* 7.4*  HCT 30.6* 27.3* 27.8* 28.2* 22.5*  MCV 93.0 89.2 88.5  90.4 89.3  PLT 300 273 253 245 219    Cardiac Enzymes: No results for input(s): CKTOTAL, CKMB, CKMBINDEX, TROPONINI in the last 168 hours.  BNP (last 3 results) No results for input(s): BNP in the last 8760 hours.  ProBNP (last 3 results) No results for input(s): PROBNP in the last 8760 hours.  Radiological Exams: No results found.  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 Acute on chronic respiratory failure with hypoxia continue with capping and room air as tolerated.  Continue pulmonary toilet and secretion management as well as supportive measures 2. Status epilepticus no active seizures noted 3. Aspiration pneumonia treated improving at this time 4. Anoxic encephalopathy grossly unchanged continue supportive measures   I have personally seen and evaluated the patient, evaluated laboratory and imaging results, formulated the assessment and plan and placed orders. The Patient requires high complexity decision making for assessment and support.  Case was discussed on Rounds with the Respiratory Therapy Staff  Yevonne Pax, MD [facility] Pulmonary Critical Care Medicine Sleep Medicine

## 2019-03-10 NOTE — Progress Notes (Signed)
IR consulted by Merril Abbe, NP for assessment of leaking gastrostomy tube.  Patient with history dysphagia s/p percutaneous gastrostomy tube placement 02/19/2019 by Dr. Fredia Sorrow.  Gtube site clean with minimal leakage, bumper loose. Gtube flushes without resistance.  Recommend bumper tight against skin to prevent leaking. Attach gastrotomy tube to suction to prevent leaking. Merril Abbe, NP aware.  IR available in future if needed.   Waylan Boga Smera Guyette, PA-C 03/10/2019, 3:14 PM

## 2019-03-11 LAB — LEVETIRACETAM LEVEL: Levetiracetam Lvl: 40 ug/mL (ref 10.0–40.0)

## 2019-03-11 LAB — RENAL FUNCTION PANEL
Albumin: 1.6 g/dL — ABNORMAL LOW (ref 3.5–5.0)
Anion gap: 12 (ref 5–15)
BUN: 6 mg/dL — ABNORMAL LOW (ref 8–23)
CO2: 21 mmol/L — ABNORMAL LOW (ref 22–32)
Calcium: 8.7 mg/dL — ABNORMAL LOW (ref 8.9–10.3)
Chloride: 107 mmol/L (ref 98–111)
Creatinine, Ser: 1.03 mg/dL — ABNORMAL HIGH (ref 0.44–1.00)
GFR calc Af Amer: >60 mL/min (ref >60)
GFR calc non Af Amer: 57 mL/min — ABNORMAL LOW (ref >60)
Glucose, Bld: 135 mg/dL — ABNORMAL HIGH (ref 70–99)
Phosphorus: 2.4 mg/dL — ABNORMAL LOW (ref 2.5–4.6)
Potassium: 3.1 mmol/L — ABNORMAL LOW (ref 3.5–5.1)
Sodium: 140 mmol/L (ref 135–145)

## 2019-03-11 LAB — CBC
HCT: 23.2 % — ABNORMAL LOW (ref 36.0–46.0)
Hemoglobin: 7.4 g/dL — ABNORMAL LOW (ref 12.0–15.0)
MCH: 28.7 pg (ref 26.0–34.0)
MCHC: 31.9 g/dL (ref 30.0–36.0)
MCV: 89.9 fL (ref 80.0–100.0)
Platelets: 224 10*3/uL (ref 150–400)
RBC: 2.58 MIL/uL — ABNORMAL LOW (ref 3.87–5.11)
RDW: 17.2 % — ABNORMAL HIGH (ref 11.5–15.5)
WBC: 3.6 10*3/uL — ABNORMAL LOW (ref 4.0–10.5)
nRBC: 0 % (ref 0.0–0.2)

## 2019-03-11 LAB — MAGNESIUM: Magnesium: 1.9 mg/dL (ref 1.7–2.4)

## 2019-03-11 NOTE — Progress Notes (Addendum)
Pulmonary Critical Care Medicine [facility] [place]   PULMONARY CRITICAL CARE SERVICE  PROGRESS NOTE  Date of Service: 03/11/2019  Erika Wang  LYY:[id number]  DOB: 08-17-1951   DOA: 01/31/2019  Referring Physician: Carron Curie, MD  HPI: Erika Wang is a 68 y.o. female seen for follow up of Acute on Chronic Respiratory Failure.  Patient has not been capped for 3 days.  Has minimal secretions with no distress noted at this time.  Will be decannulated today.  Medications: Reviewed on Rounds  Physical Exam:  Vitals: Pulse 91 respirations 17 BP 161/95 O2 sat 99% temp 97.4  Ventilator Settings room air  . General: Comfortable at this time . Eyes: Grossly normal lids, irises & conjunctiva . ENT: grossly tongue is normal . Neck: no obvious mass . Cardiovascular: S1 S2 normal no gallop . Respiratory: No rales or rhonchi noted . Abdomen: soft . Skin: no rash seen on limited exam . Musculoskeletal: not rigid . Psychiatric:unable to assess . Neurologic: no seizure no involuntary movements         Lab Data:   Basic Metabolic Panel: Recent Labs  Lab 03/06/19 0559 03/07/19 0328 03/09/19 0643 03/10/19 0612 03/10/19 2206 03/11/19 0607  NA 142 142 141 141  --  140  K 3.1* 3.8 3.7 2.7* 3.3* 3.1*  CL 106 109 113* 109  --  107  CO2 22 22 19* 21*  --  21*  GLUCOSE 113* 120* 97 115*  --  135*  BUN 7* 5* 5* 8  --  6*  CREATININE 1.31* 1.22* 1.09* 1.12*  --  1.03*  CALCIUM 9.1 9.4 8.8* 8.6*  --  8.7*  MG  --   --  1.6* 2.0  --  1.9  PHOS 2.6  --   --   --   --  2.4*    ABG: No results for input(s): PHART, PCO2ART, PO2ART, HCO3, O2SAT in the last 168 hours.  Liver Function Tests: Recent Labs  Lab 03/06/19 0559 03/11/19 0607  ALBUMIN 1.7* 1.6*   No results for input(s): LIPASE, AMYLASE in the last 168 hours. No results for input(s): AMMONIA in the last 168 hours.  CBC: Recent Labs  Lab 03/06/19 0559 03/07/19 0328 03/09/19 0643 03/10/19 0612  03/11/19 0607  WBC 4.9 4.4 4.3 4.8 3.6*  HGB 8.8* 9.3* 9.0* 7.4* 7.4*  HCT 27.3* 27.8* 28.2* 22.5* 23.2*  MCV 89.2 88.5 90.4 89.3 89.9  PLT 273 253 245 219 224    Cardiac Enzymes: No results for input(s): CKTOTAL, CKMB, CKMBINDEX, TROPONINI in the last 168 hours.  BNP (last 3 results) No results for input(s): BNP in the last 8760 hours.  ProBNP (last 3 results) No results for input(s): PROBNP in the last 8760 hours.  Radiological Exams: No results found.  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 Acute on chronic respiratory failure with hypoxia continue on room air at this time.  Patient be decannulated today.  Doing well with good saturations. 2. Status epilepticus no active seizures noted 3. Aspiration pneumonia treated improving at this time 4. Anoxic encephalopathy grossly unchanged continue supportive measures   I have personally seen and evaluated the patient, evaluated laboratory and imaging results, formulated the assessment and plan and placed orders. The Patient requires high complexity decision making for assessment and support.  Case was discussed on Rounds with the Respiratory Therapy Staff  Allyne Gee, MD [facility] Pulmonary Critical Care Medicine Sleep Medicine

## 2019-03-12 LAB — MAGNESIUM: Magnesium: 1.8 mg/dL (ref 1.7–2.4)

## 2019-03-12 LAB — BASIC METABOLIC PANEL
Anion gap: 10 (ref 5–15)
BUN: 10 mg/dL (ref 8–23)
CO2: 23 mmol/L (ref 22–32)
Calcium: 9.1 mg/dL (ref 8.9–10.3)
Chloride: 109 mmol/L (ref 98–111)
Creatinine, Ser: 0.99 mg/dL (ref 0.44–1.00)
GFR calc Af Amer: >60 mL/min (ref >60)
GFR calc non Af Amer: 59 mL/min — ABNORMAL LOW (ref >60)
Glucose, Bld: 151 mg/dL — ABNORMAL HIGH (ref 70–99)
Potassium: 3.6 mmol/L (ref 3.5–5.1)
Sodium: 142 mmol/L (ref 135–145)

## 2019-03-12 LAB — CBC
HCT: 23.4 % — ABNORMAL LOW (ref 36.0–46.0)
Hemoglobin: 7.5 g/dL — ABNORMAL LOW (ref 12.0–15.0)
MCH: 29.2 pg (ref 26.0–34.0)
MCHC: 32.1 g/dL (ref 30.0–36.0)
MCV: 91.1 fL (ref 80.0–100.0)
Platelets: 225 10*3/uL (ref 150–400)
RBC: 2.57 MIL/uL — ABNORMAL LOW (ref 3.87–5.11)
RDW: 17.8 % — ABNORMAL HIGH (ref 11.5–15.5)
WBC: 5.2 10*3/uL (ref 4.0–10.5)
nRBC: 0 % (ref 0.0–0.2)

## 2019-03-12 LAB — PHOSPHORUS: Phosphorus: 2.6 mg/dL (ref 2.5–4.6)

## 2019-03-12 NOTE — Progress Notes (Addendum)
Pulmonary Critical Care Medicine [facility] [place]   PULMONARY CRITICAL CARE SERVICE  PROGRESS NOTE  Date of Service: 03/12/2019  Erika Wang  QHU:[id number]  DOB: Feb 28, 1951   DOA: 01/31/2019  Referring Physician: Carron Curie, MD  HPI: Erika Wang is a 68 y.o. female seen for follow up of Acute on Chronic Respiratory Failure.  Patient was decannulated yesterday and is doing well today on room air with no distress.  Medications: Reviewed on Rounds  Physical Exam:  Vitals: Pulse 86 respirations 19 BP 155/87 O2 sat 100% temp 96.9  Ventilator Settings room air  . General: Comfortable at this time . Eyes: Grossly normal lids, irises & conjunctiva . ENT: grossly tongue is normal . Neck: no obvious mass . Cardiovascular: S1 S2 normal no gallop . Respiratory: No rales or rhonchi noted . Abdomen: soft . Skin: no rash seen on limited exam . Musculoskeletal: not rigid . Psychiatric:unable to assess . Neurologic: no seizure no involuntary movements         Lab Data:   Basic Metabolic Panel: Recent Labs  Lab 03/06/19 0559 03/07/19 0328 03/09/19 4656 03/10/19 0612 03/10/19 2206 03/11/19 0607 03/12/19 0706  NA 142 142 141 141  --  140 142  K 3.1* 3.8 3.7 2.7* 3.3* 3.1* 3.6  CL 106 109 113* 109  --  107 109  CO2 22 22 19* 21*  --  21* 23  GLUCOSE 113* 120* 97 115*  --  135* 151*  BUN 7* 5* 5* 8  --  6* 10  CREATININE 1.31* 1.22* 1.09* 1.12*  --  1.03* 0.99  CALCIUM 9.1 9.4 8.8* 8.6*  --  8.7* 9.1  MG  --   --  1.6* 2.0  --  1.9 1.8  PHOS 2.6  --   --   --   --  2.4* 2.6    ABG: No results for input(s): PHART, PCO2ART, PO2ART, HCO3, O2SAT in the last 168 hours.  Liver Function Tests: Recent Labs  Lab 03/06/19 0559 03/11/19 0607  ALBUMIN 1.7* 1.6*   No results for input(s): LIPASE, AMYLASE in the last 168 hours. No results for input(s): AMMONIA in the last 168 hours.  CBC: Recent Labs  Lab 03/07/19 0328 03/09/19 0643 03/10/19 0612  03/11/19 0607 03/12/19 0706  WBC 4.4 4.3 4.8 3.6* 5.2  HGB 9.3* 9.0* 7.4* 7.4* 7.5*  HCT 27.8* 28.2* 22.5* 23.2* 23.4*  MCV 88.5 90.4 89.3 89.9 91.1  PLT 253 245 219 224 225    Cardiac Enzymes: No results for input(s): CKTOTAL, CKMB, CKMBINDEX, TROPONINI in the last 168 hours.  BNP (last 3 results) No results for input(s): BNP in the last 8760 hours.  ProBNP (last 3 results) No results for input(s): PROBNP in the last 8760 hours.  Radiological Exams: No results found.  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 Acute on chronic respiratory failure with hypoxia continue on room air at this time.  Patient has been decannulated for 24 hours and is doing well with good saturations.  Continue pulmonary toilet and supportive measures 2. Status epilepticus no active seizures noted 3. Aspiration pneumonia treated improving at this time 4. Anoxic encephalopathy grossly unchanged continue supportive measures   I have personally seen and evaluated the patient, evaluated laboratory and imaging results, formulated the assessment and plan  and placed orders. The Patient requires high complexity decision making for assessment and support.  Case was discussed on Rounds with the Respiratory Therapy Staff  Allyne Gee, MD [facility] Pulmonary Critical Care Medicine Sleep Medicine

## 2019-03-13 LAB — CBC
HCT: 21.8 % — ABNORMAL LOW (ref 36.0–46.0)
Hemoglobin: 7 g/dL — ABNORMAL LOW (ref 12.0–15.0)
MCH: 29.5 pg (ref 26.0–34.0)
MCHC: 32.1 g/dL (ref 30.0–36.0)
MCV: 92 fL (ref 80.0–100.0)
Platelets: 254 10*3/uL (ref 150–400)
RBC: 2.37 MIL/uL — ABNORMAL LOW (ref 3.87–5.11)
RDW: 18.4 % — ABNORMAL HIGH (ref 11.5–15.5)
WBC: 7.3 10*3/uL (ref 4.0–10.5)
nRBC: 0 % (ref 0.0–0.2)

## 2019-03-13 LAB — BASIC METABOLIC PANEL
Anion gap: 8 (ref 5–15)
BUN: 17 mg/dL (ref 8–23)
CO2: 22 mmol/L (ref 22–32)
Calcium: 9.3 mg/dL (ref 8.9–10.3)
Chloride: 109 mmol/L (ref 98–111)
Creatinine, Ser: 1.26 mg/dL — ABNORMAL HIGH (ref 0.44–1.00)
GFR calc Af Amer: 51 mL/min — ABNORMAL LOW (ref >60)
GFR calc non Af Amer: 44 mL/min — ABNORMAL LOW (ref >60)
Glucose, Bld: 192 mg/dL — ABNORMAL HIGH (ref 70–99)
Potassium: 3.9 mmol/L (ref 3.5–5.1)
Sodium: 139 mmol/L (ref 135–145)

## 2019-03-13 LAB — MAGNESIUM: Magnesium: 1.8 mg/dL (ref 1.7–2.4)

## 2019-03-13 LAB — PHOSPHORUS: Phosphorus: 3.1 mg/dL (ref 2.5–4.6)

## 2019-03-13 LAB — PREPARE RBC (CROSSMATCH)

## 2019-03-13 LAB — ABO/RH: ABO/RH(D): O POS

## 2019-03-14 LAB — CBC
HCT: 27.1 % — ABNORMAL LOW (ref 36.0–46.0)
Hemoglobin: 8.9 g/dL — ABNORMAL LOW (ref 12.0–15.0)
MCH: 29.9 pg (ref 26.0–34.0)
MCHC: 32.8 g/dL (ref 30.0–36.0)
MCV: 90.9 fL (ref 80.0–100.0)
Platelets: 275 10*3/uL (ref 150–400)
RBC: 2.98 MIL/uL — ABNORMAL LOW (ref 3.87–5.11)
RDW: 17.7 % — ABNORMAL HIGH (ref 11.5–15.5)
WBC: 8.1 10*3/uL (ref 4.0–10.5)
nRBC: 0 % (ref 0.0–0.2)

## 2019-03-14 LAB — TYPE AND SCREEN
ABO/RH(D): O POS
Antibody Screen: NEGATIVE
Unit division: 0

## 2019-03-14 LAB — BPAM RBC
Blood Product Expiration Date: 202006012359
ISSUE DATE / TIME: 202005141516
Unit Type and Rh: 5100

## 2019-03-14 NOTE — Progress Notes (Addendum)
Pulmonary Critical Care Medicine [facility] [place]   PULMONARY CRITICAL CARE SERVICE  PROGRESS NOTE  Date of Service: 03/14/2019  Erika Wang  DXI:[id number]  DOB: May 26, 1951   DOA: 01/31/2019  Referring Physician: Carron Curie, MD  HPI: Erika Wang is a 68 y.o. female seen for follow up of Acute on Chronic Respiratory Failure.  Patient is on room air has been decannulated for 3 days.  Has minimal secretions and is doing well.  At this time pulmonary will sign off on patient and be available for further consultation as needed.  Medications: Reviewed on Rounds  Physical Exam:  Vitals: Pulse 76 respirations 21 BP 154/86 O2 sat 99% temp 95.9  Ventilator Settings room air  . General: Comfortable at this time . Eyes: Grossly normal lids, irises & conjunctiva . ENT: grossly tongue is normal . Neck: no obvious mass . Cardiovascular: S1 S2 normal no gallop . Respiratory: No rales rhonchi noted . Abdomen: soft . Skin: no rash seen on limited exam . Musculoskeletal: not rigid . Psychiatric:unable to assess . Neurologic: no seizure no involuntary movements         Lab Data:   Basic Metabolic Panel: Recent Labs  Lab 03/09/19 0643 03/10/19 0612 03/10/19 2206 03/11/19 0607 03/12/19 0706 03/13/19 0719  NA 141 141  --  140 142 139  K 3.7 2.7* 3.3* 3.1* 3.6 3.9  CL 113* 109  --  107 109 109  CO2 19* 21*  --  21* 23 22  GLUCOSE 97 115*  --  135* 151* 192*  BUN 5* 8  --  6* 10 17  CREATININE 1.09* 1.12*  --  1.03* 0.99 1.26*  CALCIUM 8.8* 8.6*  --  8.7* 9.1 9.3  MG 1.6* 2.0  --  1.9 1.8 1.8  PHOS  --   --   --  2.4* 2.6 3.1    ABG: No results for input(s): PHART, PCO2ART, PO2ART, HCO3, O2SAT in the last 168 hours.  Liver Function Tests: Recent Labs  Lab 03/11/19 0607  ALBUMIN 1.6*   No results for input(s): LIPASE, AMYLASE in the last 168 hours. No results for input(s): AMMONIA in the last 168 hours.  CBC: Recent Labs  Lab 03/10/19 0612  03/11/19 0607 03/12/19 0706 03/13/19 0719 03/14/19 0703  WBC 4.8 3.6* 5.2 7.3 8.1  HGB 7.4* 7.4* 7.5* 7.0* 8.9*  HCT 22.5* 23.2* 23.4* 21.8* 27.1*  MCV 89.3 89.9 91.1 92.0 90.9  PLT 219 224 225 254 275    Cardiac Enzymes: No results for input(s): CKTOTAL, CKMB, CKMBINDEX, TROPONINI in the last 168 hours.  BNP (last 3 results) No results for input(s): BNP in the last 8760 hours.  ProBNP (last 3 results) No results for input(s): PROBNP in the last 8760 hours.  Radiological Exams: No results found.  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 Acute on chronic respiratory failure with hypoxia continue with right this time.  Has not been decannulated for 3 days and is doing well with good saturations.  Continue supportive measures. 2. Status appears no active seizures noted 3. Aspiration ammonia treated improving at this time 4. Anoxic cephalopathy grossly unchanged continue supportive measures   I have personally seen and evaluated the patient, evaluated laboratory and imaging results, formulated the assessment and plan and placed orders. The Patient  requires high complexity decision making for assessment and support.  Case was discussed on Rounds with the Respiratory Therapy Staff  Allyne Gee, MD [facility] Pulmonary Critical Care Medicine Sleep Medicine

## 2019-03-16 ENCOUNTER — Other Ambulatory Visit (HOSPITAL_COMMUNITY): Payer: Medicare HMO

## 2019-03-16 LAB — BASIC METABOLIC PANEL
Anion gap: 14 (ref 5–15)
BUN: 32 mg/dL — ABNORMAL HIGH (ref 8–23)
CO2: 21 mmol/L — ABNORMAL LOW (ref 22–32)
Calcium: 9.8 mg/dL (ref 8.9–10.3)
Chloride: 101 mmol/L (ref 98–111)
Creatinine, Ser: 1.29 mg/dL — ABNORMAL HIGH (ref 0.44–1.00)
GFR calc Af Amer: 50 mL/min — ABNORMAL LOW (ref >60)
GFR calc non Af Amer: 43 mL/min — ABNORMAL LOW (ref >60)
Glucose, Bld: 142 mg/dL — ABNORMAL HIGH (ref 70–99)
Potassium: 4.7 mmol/L (ref 3.5–5.1)
Sodium: 136 mmol/L (ref 135–145)

## 2019-03-16 LAB — CBC
HCT: 32.1 % — ABNORMAL LOW (ref 36.0–46.0)
Hemoglobin: 10.4 g/dL — ABNORMAL LOW (ref 12.0–15.0)
MCH: 30 pg (ref 26.0–34.0)
MCHC: 32.4 g/dL (ref 30.0–36.0)
MCV: 92.5 fL (ref 80.0–100.0)
Platelets: 322 10*3/uL (ref 150–400)
RBC: 3.47 MIL/uL — ABNORMAL LOW (ref 3.87–5.11)
RDW: 18.8 % — ABNORMAL HIGH (ref 11.5–15.5)
WBC: 4.3 10*3/uL (ref 4.0–10.5)
nRBC: 0 % (ref 0.0–0.2)

## 2019-03-16 LAB — MAGNESIUM: Magnesium: 1.7 mg/dL (ref 1.7–2.4)

## 2019-03-16 LAB — PHOSPHORUS: Phosphorus: 5.2 mg/dL — ABNORMAL HIGH (ref 2.5–4.6)

## 2019-03-17 LAB — RENAL FUNCTION PANEL
Albumin: 1.7 g/dL — ABNORMAL LOW (ref 3.5–5.0)
Anion gap: 15 (ref 5–15)
BUN: 44 mg/dL — ABNORMAL HIGH (ref 8–23)
CO2: 20 mmol/L — ABNORMAL LOW (ref 22–32)
Calcium: 9.5 mg/dL (ref 8.9–10.3)
Chloride: 102 mmol/L (ref 98–111)
Creatinine, Ser: 1.36 mg/dL — ABNORMAL HIGH (ref 0.44–1.00)
GFR calc Af Amer: 47 mL/min — ABNORMAL LOW (ref >60)
GFR calc non Af Amer: 40 mL/min — ABNORMAL LOW (ref >60)
Glucose, Bld: 197 mg/dL — ABNORMAL HIGH (ref 70–99)
Phosphorus: 4.5 mg/dL (ref 2.5–4.6)
Potassium: 4.1 mmol/L (ref 3.5–5.1)
Sodium: 137 mmol/L (ref 135–145)

## 2019-03-17 LAB — URINALYSIS, ROUTINE W REFLEX MICROSCOPIC
Bilirubin Urine: NEGATIVE
Glucose, UA: 50 mg/dL — AB
Hgb urine dipstick: NEGATIVE
Ketones, ur: NEGATIVE mg/dL
Nitrite: NEGATIVE
Protein, ur: NEGATIVE mg/dL
Specific Gravity, Urine: 1.017 (ref 1.005–1.030)
pH: 5 (ref 5.0–8.0)

## 2019-03-17 LAB — MAGNESIUM: Magnesium: 2.5 mg/dL — ABNORMAL HIGH (ref 1.7–2.4)

## 2019-03-17 LAB — CBC
HCT: 27.8 % — ABNORMAL LOW (ref 36.0–46.0)
Hemoglobin: 8.9 g/dL — ABNORMAL LOW (ref 12.0–15.0)
MCH: 30 pg (ref 26.0–34.0)
MCHC: 32 g/dL (ref 30.0–36.0)
MCV: 93.6 fL (ref 80.0–100.0)
Platelets: 335 10*3/uL (ref 150–400)
RBC: 2.97 MIL/uL — ABNORMAL LOW (ref 3.87–5.11)
RDW: 18.6 % — ABNORMAL HIGH (ref 11.5–15.5)
WBC: 8.9 10*3/uL (ref 4.0–10.5)
nRBC: 0 % (ref 0.0–0.2)

## 2019-03-18 ENCOUNTER — Other Ambulatory Visit (HOSPITAL_COMMUNITY): Payer: Medicare HMO

## 2019-03-18 LAB — URINE CULTURE: Culture: NO GROWTH

## 2019-03-18 LAB — CULTURE, RESPIRATORY W GRAM STAIN: Culture: NORMAL

## 2019-03-19 LAB — CBC
HCT: 26.9 % — ABNORMAL LOW (ref 36.0–46.0)
Hemoglobin: 8.7 g/dL — ABNORMAL LOW (ref 12.0–15.0)
MCH: 29.8 pg (ref 26.0–34.0)
MCHC: 32.3 g/dL (ref 30.0–36.0)
MCV: 92.1 fL (ref 80.0–100.0)
Platelets: 390 10*3/uL (ref 150–400)
RBC: 2.92 MIL/uL — ABNORMAL LOW (ref 3.87–5.11)
RDW: 18.2 % — ABNORMAL HIGH (ref 11.5–15.5)
WBC: 9.3 10*3/uL (ref 4.0–10.5)
nRBC: 0.3 % — ABNORMAL HIGH (ref 0.0–0.2)

## 2019-03-19 LAB — COMPREHENSIVE METABOLIC PANEL
ALT: 24 U/L (ref 0–44)
AST: 16 U/L (ref 15–41)
Albumin: 1.5 g/dL — ABNORMAL LOW (ref 3.5–5.0)
Alkaline Phosphatase: 134 U/L — ABNORMAL HIGH (ref 38–126)
Anion gap: 12 (ref 5–15)
BUN: 33 mg/dL — ABNORMAL HIGH (ref 8–23)
CO2: 22 mmol/L (ref 22–32)
Calcium: 9.5 mg/dL (ref 8.9–10.3)
Chloride: 111 mmol/L (ref 98–111)
Creatinine, Ser: 0.98 mg/dL (ref 0.44–1.00)
GFR calc Af Amer: >60 mL/min (ref >60)
GFR calc non Af Amer: >60 mL/min (ref >60)
Glucose, Bld: 145 mg/dL — ABNORMAL HIGH (ref 70–99)
Potassium: 3.2 mmol/L — ABNORMAL LOW (ref 3.5–5.1)
Sodium: 145 mmol/L (ref 135–145)
Total Bilirubin: 0.4 mg/dL (ref 0.3–1.2)
Total Protein: 6.3 g/dL — ABNORMAL LOW (ref 6.5–8.1)

## 2019-03-20 LAB — POTASSIUM: Potassium: 3.8 mmol/L (ref 3.5–5.1)

## 2019-03-20 LAB — NOVEL CORONAVIRUS, NAA (HOSP ORDER, SEND-OUT TO REF LAB; TAT 18-24 HRS): SARS-CoV-2, NAA: NOT DETECTED

## 2019-03-20 LAB — LACTIC ACID, PLASMA
Lactic Acid, Venous: 1.1 mmol/L (ref 0.5–1.9)
Lactic Acid, Venous: 1 mmol/L (ref 0.5–1.9)

## 2019-03-21 ENCOUNTER — Other Ambulatory Visit (HOSPITAL_COMMUNITY): Payer: Medicare HMO

## 2019-03-21 LAB — URINALYSIS, ROUTINE W REFLEX MICROSCOPIC
Bacteria, UA: NONE SEEN
Bilirubin Urine: NEGATIVE
Glucose, UA: NEGATIVE mg/dL
Ketones, ur: NEGATIVE mg/dL
Leukocytes,Ua: NEGATIVE
Nitrite: NEGATIVE
Protein, ur: 30 mg/dL — AB
Specific Gravity, Urine: 1.015 (ref 1.005–1.030)
pH: 6 (ref 5.0–8.0)

## 2019-03-21 LAB — CBC
HCT: 26.1 % — ABNORMAL LOW (ref 36.0–46.0)
Hemoglobin: 8.6 g/dL — ABNORMAL LOW (ref 12.0–15.0)
MCH: 30.5 pg (ref 26.0–34.0)
MCHC: 33 g/dL (ref 30.0–36.0)
MCV: 92.6 fL (ref 80.0–100.0)
Platelets: 384 10*3/uL (ref 150–400)
RBC: 2.82 MIL/uL — ABNORMAL LOW (ref 3.87–5.11)
RDW: 18.5 % — ABNORMAL HIGH (ref 11.5–15.5)
WBC: 11.5 10*3/uL — ABNORMAL HIGH (ref 4.0–10.5)
nRBC: 0.3 % — ABNORMAL HIGH (ref 0.0–0.2)

## 2019-03-21 LAB — COMPREHENSIVE METABOLIC PANEL
ALT: 24 U/L (ref 0–44)
AST: 16 U/L (ref 15–41)
Albumin: 1.5 g/dL — ABNORMAL LOW (ref 3.5–5.0)
Alkaline Phosphatase: 139 U/L — ABNORMAL HIGH (ref 38–126)
Anion gap: 14 (ref 5–15)
BUN: 29 mg/dL — ABNORMAL HIGH (ref 8–23)
CO2: 22 mmol/L (ref 22–32)
Calcium: 10.3 mg/dL (ref 8.9–10.3)
Chloride: 116 mmol/L — ABNORMAL HIGH (ref 98–111)
Creatinine, Ser: 0.97 mg/dL (ref 0.44–1.00)
GFR calc Af Amer: >60 mL/min (ref >60)
GFR calc non Af Amer: >60 mL/min (ref >60)
Glucose, Bld: 227 mg/dL — ABNORMAL HIGH (ref 70–99)
Potassium: 3.7 mmol/L (ref 3.5–5.1)
Sodium: 152 mmol/L — ABNORMAL HIGH (ref 135–145)
Total Bilirubin: 0.4 mg/dL (ref 0.3–1.2)
Total Protein: 6.8 g/dL (ref 6.5–8.1)

## 2019-03-21 LAB — C DIFFICILE QUICK SCREEN W PCR REFLEX
C Diff antigen: NEGATIVE
C Diff interpretation: NOT DETECTED
C Diff toxin: NEGATIVE

## 2019-03-21 LAB — LACTIC ACID, PLASMA: Lactic Acid, Venous: 1.3 mmol/L (ref 0.5–1.9)

## 2019-03-21 LAB — AMMONIA: Ammonia: 31 umol/L (ref 9–35)

## 2019-03-22 LAB — BASIC METABOLIC PANEL
Anion gap: 10 (ref 5–15)
BUN: 27 mg/dL — ABNORMAL HIGH (ref 8–23)
CO2: 21 mmol/L — ABNORMAL LOW (ref 22–32)
Calcium: 9.7 mg/dL (ref 8.9–10.3)
Chloride: 108 mmol/L (ref 98–111)
Creatinine, Ser: 0.8 mg/dL (ref 0.44–1.00)
GFR calc Af Amer: >60 mL/min (ref >60)
GFR calc non Af Amer: >60 mL/min (ref >60)
Glucose, Bld: 141 mg/dL — ABNORMAL HIGH (ref 70–99)
Potassium: 3.6 mmol/L (ref 3.5–5.1)
Sodium: 139 mmol/L (ref 135–145)

## 2019-03-22 LAB — URINE CULTURE: Culture: NO GROWTH

## 2019-03-22 LAB — CBC
HCT: 26.4 % — ABNORMAL LOW (ref 36.0–46.0)
Hemoglobin: 8.3 g/dL — ABNORMAL LOW (ref 12.0–15.0)
MCH: 29.5 pg (ref 26.0–34.0)
MCHC: 31.4 g/dL (ref 30.0–36.0)
MCV: 94 fL (ref 80.0–100.0)
Platelets: 369 10*3/uL (ref 150–400)
RBC: 2.81 MIL/uL — ABNORMAL LOW (ref 3.87–5.11)
RDW: 18.6 % — ABNORMAL HIGH (ref 11.5–15.5)
WBC: 10 10*3/uL (ref 4.0–10.5)
nRBC: 0.2 % (ref 0.0–0.2)

## 2019-03-22 LAB — PHENYTOIN LEVEL, TOTAL: Phenytoin Lvl: 12.9 ug/mL (ref 10.0–20.0)

## 2019-03-23 LAB — BASIC METABOLIC PANEL
Anion gap: 10 (ref 5–15)
BUN: 31 mg/dL — ABNORMAL HIGH (ref 8–23)
CO2: 23 mmol/L (ref 22–32)
Calcium: 9.9 mg/dL (ref 8.9–10.3)
Chloride: 109 mmol/L (ref 98–111)
Creatinine, Ser: 0.86 mg/dL (ref 0.44–1.00)
GFR calc Af Amer: >60 mL/min (ref >60)
GFR calc non Af Amer: >60 mL/min (ref >60)
Glucose, Bld: 167 mg/dL — ABNORMAL HIGH (ref 70–99)
Potassium: 4 mmol/L (ref 3.5–5.1)
Sodium: 142 mmol/L (ref 135–145)

## 2019-03-23 LAB — CULTURE, RESPIRATORY W GRAM STAIN: Culture: NORMAL

## 2019-03-23 LAB — MAGNESIUM: Magnesium: 1.8 mg/dL (ref 1.7–2.4)

## 2019-03-25 LAB — BASIC METABOLIC PANEL
Anion gap: 11 (ref 5–15)
BUN: 32 mg/dL — ABNORMAL HIGH (ref 8–23)
CO2: 23 mmol/L (ref 22–32)
Calcium: 10 mg/dL (ref 8.9–10.3)
Chloride: 108 mmol/L (ref 98–111)
Creatinine, Ser: 0.97 mg/dL (ref 0.44–1.00)
GFR calc Af Amer: >60 mL/min (ref >60)
GFR calc non Af Amer: >60 mL/min (ref >60)
Glucose, Bld: 179 mg/dL — ABNORMAL HIGH (ref 70–99)
Potassium: 3.9 mmol/L (ref 3.5–5.1)
Sodium: 142 mmol/L (ref 135–145)

## 2019-03-25 LAB — LEVETIRACETAM LEVEL: Levetiracetam Lvl: 36.7 ug/mL (ref 10.0–40.0)

## 2019-03-25 LAB — CBC
HCT: 28.2 % — ABNORMAL LOW (ref 36.0–46.0)
Hemoglobin: 8.9 g/dL — ABNORMAL LOW (ref 12.0–15.0)
MCH: 29.6 pg (ref 26.0–34.0)
MCHC: 31.6 g/dL (ref 30.0–36.0)
MCV: 93.7 fL (ref 80.0–100.0)
Platelets: 397 10*3/uL (ref 150–400)
RBC: 3.01 MIL/uL — ABNORMAL LOW (ref 3.87–5.11)
RDW: 18.6 % — ABNORMAL HIGH (ref 11.5–15.5)
WBC: 9.8 10*3/uL (ref 4.0–10.5)
nRBC: 0 % (ref 0.0–0.2)

## 2019-03-25 LAB — MAGNESIUM: Magnesium: 2 mg/dL (ref 1.7–2.4)

## 2019-03-25 LAB — PHOSPHORUS: Phosphorus: 3.5 mg/dL (ref 2.5–4.6)

## 2019-04-10 ENCOUNTER — Emergency Department (HOSPITAL_COMMUNITY): Payer: Medicare HMO

## 2019-04-10 ENCOUNTER — Inpatient Hospital Stay (HOSPITAL_COMMUNITY)
Admission: EM | Admit: 2019-04-10 | Discharge: 2019-04-30 | DRG: 871 | Disposition: E | Payer: Medicare HMO | Attending: Pulmonary Disease | Admitting: Pulmonary Disease

## 2019-04-10 ENCOUNTER — Other Ambulatory Visit: Payer: Self-pay

## 2019-04-10 ENCOUNTER — Encounter (HOSPITAL_COMMUNITY): Payer: Self-pay

## 2019-04-10 DIAGNOSIS — I10 Essential (primary) hypertension: Secondary | ICD-10-CM | POA: Diagnosis present

## 2019-04-10 DIAGNOSIS — A419 Sepsis, unspecified organism: Principal | ICD-10-CM | POA: Diagnosis present

## 2019-04-10 DIAGNOSIS — J189 Pneumonia, unspecified organism: Secondary | ICD-10-CM

## 2019-04-10 DIAGNOSIS — E860 Dehydration: Secondary | ICD-10-CM | POA: Diagnosis present

## 2019-04-10 DIAGNOSIS — N390 Urinary tract infection, site not specified: Secondary | ICD-10-CM | POA: Diagnosis present

## 2019-04-10 DIAGNOSIS — N179 Acute kidney failure, unspecified: Secondary | ICD-10-CM | POA: Diagnosis present

## 2019-04-10 DIAGNOSIS — R509 Fever, unspecified: Secondary | ICD-10-CM | POA: Diagnosis present

## 2019-04-10 DIAGNOSIS — E7800 Pure hypercholesterolemia, unspecified: Secondary | ICD-10-CM | POA: Diagnosis present

## 2019-04-10 DIAGNOSIS — J80 Acute respiratory distress syndrome: Secondary | ICD-10-CM | POA: Diagnosis present

## 2019-04-10 DIAGNOSIS — Y95 Nosocomial condition: Secondary | ICD-10-CM | POA: Diagnosis present

## 2019-04-10 DIAGNOSIS — E785 Hyperlipidemia, unspecified: Secondary | ICD-10-CM | POA: Diagnosis present

## 2019-04-10 DIAGNOSIS — E861 Hypovolemia: Secondary | ICD-10-CM | POA: Diagnosis present

## 2019-04-10 DIAGNOSIS — Z8673 Personal history of transient ischemic attack (TIA), and cerebral infarction without residual deficits: Secondary | ICD-10-CM | POA: Diagnosis not present

## 2019-04-10 DIAGNOSIS — Z8249 Family history of ischemic heart disease and other diseases of the circulatory system: Secondary | ICD-10-CM | POA: Diagnosis not present

## 2019-04-10 DIAGNOSIS — Z20828 Contact with and (suspected) exposure to other viral communicable diseases: Secondary | ICD-10-CM | POA: Diagnosis present

## 2019-04-10 DIAGNOSIS — R6521 Severe sepsis with septic shock: Secondary | ICD-10-CM | POA: Diagnosis present

## 2019-04-10 DIAGNOSIS — Z87891 Personal history of nicotine dependence: Secondary | ICD-10-CM

## 2019-04-10 DIAGNOSIS — R54 Age-related physical debility: Secondary | ICD-10-CM | POA: Diagnosis present

## 2019-04-10 DIAGNOSIS — Z794 Long term (current) use of insulin: Secondary | ICD-10-CM | POA: Diagnosis not present

## 2019-04-10 DIAGNOSIS — J9601 Acute respiratory failure with hypoxia: Secondary | ICD-10-CM

## 2019-04-10 DIAGNOSIS — I959 Hypotension, unspecified: Secondary | ICD-10-CM

## 2019-04-10 DIAGNOSIS — E119 Type 2 diabetes mellitus without complications: Secondary | ICD-10-CM | POA: Diagnosis present

## 2019-04-10 DIAGNOSIS — Z7982 Long term (current) use of aspirin: Secondary | ICD-10-CM | POA: Diagnosis not present

## 2019-04-10 DIAGNOSIS — G40909 Epilepsy, unspecified, not intractable, without status epilepticus: Secondary | ICD-10-CM | POA: Diagnosis present

## 2019-04-10 DIAGNOSIS — E870 Hyperosmolality and hypernatremia: Secondary | ICD-10-CM | POA: Diagnosis present

## 2019-04-10 DIAGNOSIS — J690 Pneumonitis due to inhalation of food and vomit: Secondary | ICD-10-CM | POA: Diagnosis present

## 2019-04-10 DIAGNOSIS — Z888 Allergy status to other drugs, medicaments and biological substances status: Secondary | ICD-10-CM | POA: Diagnosis not present

## 2019-04-10 DIAGNOSIS — Z79899 Other long term (current) drug therapy: Secondary | ICD-10-CM | POA: Diagnosis not present

## 2019-04-10 DIAGNOSIS — J9621 Acute and chronic respiratory failure with hypoxia: Secondary | ICD-10-CM

## 2019-04-10 LAB — URINALYSIS, ROUTINE W REFLEX MICROSCOPIC
Bilirubin Urine: NEGATIVE
Glucose, UA: NEGATIVE mg/dL
Ketones, ur: NEGATIVE mg/dL
Nitrite: NEGATIVE
Protein, ur: 100 mg/dL — AB
Specific Gravity, Urine: 1.018 (ref 1.005–1.030)
pH: 5 (ref 5.0–8.0)

## 2019-04-10 LAB — CBC WITH DIFFERENTIAL/PLATELET
Abs Immature Granulocytes: 0.11 10*3/uL — ABNORMAL HIGH (ref 0.00–0.07)
Basophils Absolute: 0.1 10*3/uL (ref 0.0–0.1)
Basophils Relative: 1 %
Eosinophils Absolute: 0 10*3/uL (ref 0.0–0.5)
Eosinophils Relative: 0 %
HCT: 35.4 % — ABNORMAL LOW (ref 36.0–46.0)
Hemoglobin: 10.5 g/dL — ABNORMAL LOW (ref 12.0–15.0)
Immature Granulocytes: 1 %
Lymphocytes Relative: 15 %
Lymphs Abs: 1.9 10*3/uL (ref 0.7–4.0)
MCH: 30.2 pg (ref 26.0–34.0)
MCHC: 29.7 g/dL — ABNORMAL LOW (ref 30.0–36.0)
MCV: 101.7 fL — ABNORMAL HIGH (ref 80.0–100.0)
Monocytes Absolute: 1 10*3/uL (ref 0.1–1.0)
Monocytes Relative: 8 %
Neutro Abs: 9.6 10*3/uL — ABNORMAL HIGH (ref 1.7–7.7)
Neutrophils Relative %: 75 %
Platelets: UNDETERMINED 10*3/uL (ref 150–400)
RBC: 3.48 MIL/uL — ABNORMAL LOW (ref 3.87–5.11)
RDW: 22.1 % — ABNORMAL HIGH (ref 11.5–15.5)
WBC: 12.8 10*3/uL — ABNORMAL HIGH (ref 4.0–10.5)
nRBC: 0.3 % — ABNORMAL HIGH (ref 0.0–0.2)

## 2019-04-10 LAB — BASIC METABOLIC PANEL
Anion gap: 13 (ref 5–15)
BUN: 85 mg/dL — ABNORMAL HIGH (ref 8–23)
CO2: 20 mmol/L — ABNORMAL LOW (ref 22–32)
Calcium: 9.7 mg/dL (ref 8.9–10.3)
Chloride: 117 mmol/L — ABNORMAL HIGH (ref 98–111)
Creatinine, Ser: 2.46 mg/dL — ABNORMAL HIGH (ref 0.44–1.00)
GFR calc Af Amer: 23 mL/min — ABNORMAL LOW (ref >60)
GFR calc non Af Amer: 20 mL/min — ABNORMAL LOW (ref >60)
Glucose, Bld: 355 mg/dL — ABNORMAL HIGH (ref 70–99)
Potassium: 4.5 mmol/L (ref 3.5–5.1)
Sodium: 150 mmol/L — ABNORMAL HIGH (ref 135–145)

## 2019-04-10 LAB — POCT I-STAT 7, (LYTES, BLD GAS, ICA,H+H)
Acid-base deficit: 12 mmol/L — ABNORMAL HIGH (ref 0.0–2.0)
Bicarbonate: 18.9 mmol/L — ABNORMAL LOW (ref 20.0–28.0)
Calcium, Ion: 1.41 mmol/L — ABNORMAL HIGH (ref 1.15–1.40)
HCT: 25 % — ABNORMAL LOW (ref 36.0–46.0)
Hemoglobin: 8.5 g/dL — ABNORMAL LOW (ref 12.0–15.0)
O2 Saturation: 85 %
Patient temperature: 104
Potassium: 4.2 mmol/L (ref 3.5–5.1)
Sodium: 154 mmol/L — ABNORMAL HIGH (ref 135–145)
TCO2: 21 mmol/L — ABNORMAL LOW (ref 22–32)
pCO2 arterial: 85 mmHg (ref 32.0–48.0)
pH, Arterial: 6.975 — CL (ref 7.350–7.450)
pO2, Arterial: 93 mmHg (ref 83.0–108.0)

## 2019-04-10 LAB — LACTIC ACID, PLASMA
Lactic Acid, Venous: 3.7 mmol/L (ref 0.5–1.9)
Lactic Acid, Venous: 3.1 mmol/L (ref 0.5–1.9)

## 2019-04-10 LAB — PROTIME-INR
INR: 1.4 — ABNORMAL HIGH (ref 0.8–1.2)
Prothrombin Time: 16.6 seconds — ABNORMAL HIGH (ref 11.4–15.2)

## 2019-04-10 LAB — SARS CORONAVIRUS 2: SARS Coronavirus 2: NOT DETECTED

## 2019-04-10 LAB — CBG MONITORING, ED: Glucose-Capillary: 169 mg/dL — ABNORMAL HIGH (ref 70–99)

## 2019-04-10 LAB — GLUCOSE, CAPILLARY: Glucose-Capillary: 168 mg/dL — ABNORMAL HIGH (ref 70–99)

## 2019-04-10 MED ORDER — PANTOPRAZOLE SODIUM 40 MG IV SOLR: 40.0000 mg | Freq: Every day | INTRAVENOUS | Status: DC

## 2019-04-10 MED ORDER — NOREPINEPHRINE 4 MG/250ML-% IV SOLN
0.0000 ug/min | INTRAVENOUS | Status: DC
  Filled 2019-04-10: qty 250

## 2019-04-10 MED ORDER — MIDAZOLAM 50MG/50ML (1MG/ML) PREMIX INFUSION: 2.0000 mg/h | INTRAVENOUS | Status: DC

## 2019-04-10 MED ORDER — FENTANYL 2500MCG IN NS 250ML (10MCG/ML) PREMIX INFUSION: 25.0000 ug/h | INTRAVENOUS | Status: DC

## 2019-04-10 MED ORDER — ROCURONIUM BROMIDE 50 MG/5ML IV SOLN
INTRAVENOUS | Status: AC | PRN
  Administered 2019-04-10: 50 mg via INTRAVENOUS

## 2019-04-10 MED ORDER — TOPIRAMATE 25 MG PO TABS: 100.0000 mg | ORAL_TABLET | Freq: Two times a day (BID) | ORAL | Status: DC

## 2019-04-10 MED ORDER — MIDAZOLAM HCL 5 MG/5ML IJ SOLN
INTRAMUSCULAR | Status: AC | PRN
  Administered 2019-04-10: 2 mg via INTRAVENOUS

## 2019-04-10 MED ORDER — MIDAZOLAM HCL 2 MG/2ML IJ SOLN: 1.0000 mg | INTRAMUSCULAR | Status: DC | PRN

## 2019-04-10 MED ORDER — VANCOMYCIN HCL IN DEXTROSE 1-5 GM/200ML-% IV SOLN
1000.0000 mg | Freq: Once | INTRAVENOUS | Status: AC
  Administered 2019-04-10: 1000 mg via INTRAVENOUS
  Filled 2019-04-10: qty 200

## 2019-04-10 MED ORDER — PHENYTOIN SODIUM 50 MG/ML IJ SOLN
50.0000 mg | Freq: Three times a day (TID) | INTRAMUSCULAR | Status: DC
  Administered 2019-04-10: 50 mg via INTRAVENOUS
  Filled 2019-04-10: qty 2

## 2019-04-10 MED ORDER — ETOMIDATE 2 MG/ML IV SOLN
INTRAVENOUS | Status: AC | PRN
  Administered 2019-04-10: 20 mg via INTRAVENOUS

## 2019-04-10 MED ORDER — FENTANYL CITRATE (PF) 100 MCG/2ML IJ SOLN
INTRAMUSCULAR | Status: AC
  Filled 2019-04-10: qty 2

## 2019-04-10 MED ORDER — ARTIFICIAL TEARS OPHTHALMIC OINT
1.0000 "application " | TOPICAL_OINTMENT | Freq: Three times a day (TID) | OPHTHALMIC | Status: DC
  Filled 2019-04-10: qty 3.5

## 2019-04-10 MED ORDER — FENTANYL CITRATE (PF) 100 MCG/2ML IJ SOLN: 100.0000 ug | Freq: Once | INTRAMUSCULAR | Status: DC

## 2019-04-10 MED ORDER — LACOSAMIDE 50 MG PO TABS: 200.0000 mg | ORAL_TABLET | Freq: Two times a day (BID) | ORAL | Status: DC

## 2019-04-10 MED ORDER — FENTANYL BOLUS VIA INFUSION: 25.0000 ug | INTRAVENOUS | Status: DC | PRN

## 2019-04-10 MED ORDER — SODIUM CHLORIDE 0.9 % IV BOLUS
1000.0000 mL | Freq: Once | INTRAVENOUS | Status: AC
  Administered 2019-04-10: 1000 mL via INTRAVENOUS

## 2019-04-10 MED ORDER — ROCURONIUM BROMIDE 50 MG/5ML IV SOLN
0.6000 mg/kg | INTRAVENOUS | Status: DC | PRN
  Filled 2019-04-10: qty 3.4

## 2019-04-10 MED ORDER — FENTANYL CITRATE (PF) 100 MCG/2ML IJ SOLN: 25.0000 ug | INTRAMUSCULAR | Status: DC | PRN

## 2019-04-10 MED ORDER — INSULIN ASPART 100 UNIT/ML ~~LOC~~ SOLN
0.0000 [IU] | SUBCUTANEOUS | Status: DC
  Administered 2019-04-10: 2 [IU] via SUBCUTANEOUS

## 2019-04-10 MED ORDER — MIDAZOLAM BOLUS VIA INFUSION
1.0000 mg | INTRAVENOUS | Status: DC | PRN
  Filled 2019-04-10: qty 2

## 2019-04-10 MED ORDER — NOREPINEPHRINE BITARTRATE 1 MG/ML IV SOLN
INTRAVENOUS | Status: DC | PRN
  Administered 2019-04-10: 20 ug/kg/min via INTRAVENOUS

## 2019-04-10 MED ORDER — SODIUM CHLORIDE 0.9 % IV SOLN: 2.0000 g | INTRAVENOUS | Status: DC

## 2019-04-10 MED ORDER — PIPERACILLIN-TAZOBACTAM IN DEX 2-0.25 GM/50ML IV SOLN
2.2500 g | Freq: Four times a day (QID) | INTRAVENOUS | Status: DC
  Administered 2019-04-10: 2.25 g via INTRAVENOUS
  Filled 2019-04-10 (×3): qty 50

## 2019-04-10 MED ORDER — FENTANYL CITRATE (PF) 100 MCG/2ML IJ SOLN
INTRAMUSCULAR | Status: AC | PRN
  Administered 2019-04-10: 100 ug via INTRAVENOUS

## 2019-04-10 MED ORDER — LEVETIRACETAM 100 MG/ML PO SOLN
1500.0000 mg | Freq: Two times a day (BID) | ORAL | Status: DC
  Filled 2019-04-10: qty 15

## 2019-04-10 MED ORDER — ASPIRIN 81 MG PO CHEW: 81.0000 mg | CHEWABLE_TABLET | Freq: Every day | ORAL | Status: DC

## 2019-04-10 MED ORDER — FREE WATER: 300.0000 mL | Freq: Three times a day (TID) | Status: DC

## 2019-04-10 MED ORDER — SODIUM CHLORIDE 0.9% FLUSH
3.0000 mL | Freq: Once | INTRAVENOUS | Status: AC
  Administered 2019-04-10: 3 mL via INTRAVENOUS

## 2019-04-10 MED ORDER — VANCOMYCIN VARIABLE DOSE PER UNSTABLE RENAL FUNCTION (PHARMACIST DOSING): Status: DC

## 2019-04-10 MED ORDER — FENTANYL BOLUS VIA INFUSION
50.0000 ug | INTRAVENOUS | Status: DC | PRN
  Filled 2019-04-10: qty 50

## 2019-04-10 MED ORDER — SODIUM CHLORIDE 0.9 % IV SOLN
2.0000 g | Freq: Once | INTRAVENOUS | Status: AC
  Administered 2019-04-10: 13:00:00 2 g via INTRAVENOUS
  Filled 2019-04-10: qty 2

## 2019-04-10 MED ORDER — FENTANYL CITRATE (PF) 100 MCG/2ML IJ SOLN: 25.0000 ug | Freq: Once | INTRAMUSCULAR | Status: DC

## 2019-04-10 MED ORDER — SODIUM CHLORIDE 0.9 % IV SOLN
INTRAVENOUS | Status: DC
  Administered 2019-04-10: 16:00:00 via INTRAVENOUS

## 2019-04-10 MED ORDER — MIDAZOLAM HCL 2 MG/2ML IJ SOLN
INTRAMUSCULAR | Status: AC
  Filled 2019-04-10: qty 2

## 2019-04-10 MED ORDER — HEPARIN SODIUM (PORCINE) 5000 UNIT/ML IJ SOLN
5000.0000 [IU] | Freq: Three times a day (TID) | INTRAMUSCULAR | Status: DC
  Administered 2019-04-10: 5000 [IU] via SUBCUTANEOUS
  Filled 2019-04-10: qty 1

## 2019-04-14 MED FILL — Norepinephrine-Dextrose IV Solution 4 MG/250ML-5%: INTRAVENOUS | Qty: 250 | Status: AC

## 2019-04-15 LAB — CULTURE, BLOOD (ROUTINE X 2)
Culture: NO GROWTH
Culture: NO GROWTH
Special Requests: ADEQUATE
Special Requests: ADEQUATE

## 2019-04-15 LAB — URINE CULTURE: Culture: >=100000 — AB

## 2019-04-23 ENCOUNTER — Telehealth: Payer: Self-pay

## 2019-04-23 NOTE — Telephone Encounter (Signed)
Received dc from [facility].. DC is for cremation and a patient of Doctor Agarwala.   DC will be taken to [facility] for signature.  On 04/24/2019 Received dc back from Doctor Agarwala.  I called the funeral home to let them know the dc is ready for pickup.

## 2019-04-30 NOTE — ED Notes (Signed)
Nurse Navigator communication: Per [employer] development officer. Pts family is outside and close for contact. It has been communicated to the EDP of their location.

## 2019-04-30 NOTE — ED Notes (Signed)
Blood was re-collected due to previous Lysis.

## 2019-04-30 NOTE — ED Provider Notes (Addendum)
[facility] EMERGENCY DEPARTMENT Provider Note   CSN: [id number] Arrival date & time: May 08, 2019  1131     History   Chief Complaint Chief Complaint  Patient presents with   Sepsis    HPI Erika Wang is a 68 y.o. female.     Patient from ecf with concern of sepsis. Arrives via EMS. Pt noted to have fever 104, and blood pressure in 80s. Has chronic sacral wound. Noted with occasional non prod cough. Pt not verbally responsive to questioning - level 5 caveat. No specific known covid+ exposure.   The history is provided by the patient and the EMS personnel. The history is limited by the condition of the patient.    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)    Aspiration pneumonia due to gastric secretions (HCC)    Anoxic encephalopathy (HCC)    Palliative care by specialist    Aspiration into airway    Goals of care, counseling/discussion    Pressure injury of skin 01/19/2019   Acute respiratory failure with hypoxia [facility])    Status epilepticus (HCC)    Community acquired pneumonia of right upper lobe of lung (HCC)    Encephalopathy acute 01/02/2019   Aspiration pneumonia (HCC) 01/02/2019   Acute renal failure (HCC) 10/07/2013   CVA (cerebral infarction) 10/03/2013   Infection of urinary tract 10/02/2013   Cigar smoker motivated to quit 10/02/2013   Basal ganglia infarction (HCC) 10/02/2013   Left hemiparesis (HCC) 10/02/2013   Encounter for intubation 10/02/2013   Stroke (HCC) 09/28/2013    Past Surgical  History:  Procedure Laterality Date   CESAREAN SECTION     IR GASTROSTOMY TUBE MOD SED  02/19/2019     OB History   No obstetric history on file.      Home Medications    Prior to Admission medications   Medication Sig Start Date End Date Taking? Authorizing Provider  acetaminophen (TYLENOL) 160 MG/5ML solution Place 20.3 mLs (650 mg total) into feeding tube every 6 (six) hours as needed for mild pain, headache or fever. 01/31/19   Drema Dallas, MD  albuterol (PROVENTIL) (2.5 MG/3ML) 0.083% nebulizer solution Take 3 mLs (2.5 mg total) by nebulization every 2 (two) hours as needed for wheezing. 01/31/19   Drema Dallas, MD  amantadine (SYMMETREL) 50 MG/5ML solution Take 10 mLs (100 mg total) by mouth daily. 01/31/19   Drema Dallas, MD  Amino Acids-Protein Hydrolys (FEEDING SUPPLEMENT, PRO-STAT SUGAR FREE 64,) LIQD Take 30 mLs by mouth 2 (two) times daily. 01/31/19   Drema Dallas, MD  aspirin EC 81 MG tablet Take 1 tablet (81 mg total) by mouth daily. 01/31/19 01/31/20  Drema Dallas, MD  diltiazem (CARDIZEM) 10 mg/ml oral suspension Take 6 mLs (60 mg total) by mouth 4 (four) times daily. 01/31/19   Drema Dallas, MD  docusate (COLACE) 50 MG/5ML liquid Take 10 mLs (100 mg total) by mouth 2 (two) times daily. 01/31/19   Drema Dallas, MD  enoxaparin (LOVENOX) 40 MG/0.4ML injection Inject 0.4 mLs (40 mg total) into the skin daily for 5 days. 01/31/19 02/05/19  Drema DallasWoods, Curtis J, MD  hydrALAZINE (APRESOLINE) 20 MG/ML injection Inject 0.5 mLs (10 mg total) into the vein every 6 (six) hours as needed (SBP>210). 01/31/19   Drema DallasWoods, Curtis J, MD  Hydrocortisone (GERHARDT'S BUTT CREAM) CREA Apply 1 application topically 2 (two) times daily. 01/31/19   Drema DallasWoods, Curtis J, MD  insulin aspart (NOVOLOG) 100 UNIT/ML injection Moderate SSI 01/31/19   Drema DallasWoods, Curtis J, MD  lacosamide (VIMPAT) 10 MG/ML oral solution Take 20 mLs (200 mg total) by mouth 2 (two) times daily. 01/31/19   Drema DallasWoods, Curtis J, MD  levETIRAcetam  (KEPPRA) 100 MG/ML solution Take 15 mLs (1,500 mg total) by mouth 2 (two) times daily. 01/31/19   Drema DallasWoods, Curtis J, MD  LORazepam (ATIVAN) 2 MG/ML injection Inject 0.5 mLs (1 mg total) into the vein every 6 (six) hours as needed for seizure. 01/31/19   Drema DallasWoods, Curtis J, MD  mouth rinse LIQD solution 15 mLs by Mouth Rinse route 6 (six) times daily. 01/31/19   Drema DallasWoods, Curtis J, MD  Multiple Vitamin (MULTIVITAMIN) LIQD Place 15 mLs into feeding tube daily. 02/01/19   Drema DallasWoods, Curtis J, MD  Nutritional Supplements (FEEDING SUPPLEMENT, JEVITY 1.2 CAL,) LIQD 5145ml/hr 01/31/19   Drema DallasWoods, Curtis J, MD  pantoprazole sodium (PROTONIX) 40 mg/20 mL PACK Place 20 mLs (40 mg total) into feeding tube daily. 01/31/19   Drema DallasWoods, Curtis J, MD  phenytoin (DILANTIN) 125 MG/5ML suspension Take 3 mLs (75 mg total) by mouth 3 (three) times daily. 01/31/19   Drema DallasWoods, Curtis J, MD  topiramate (TOPAMAX) 100 MG tablet Place 1 tablet (100 mg total) into feeding tube 2 (two) times daily. 01/31/19 01/31/20  Drema DallasWoods, Curtis J, MD  topiramate (TOPAMAX) 25 MG tablet Place 3 tablets (75 mg total) into feeding tube 2 (two) times daily. 01/31/19   Drema DallasWoods, Curtis J, MD  Water For Irrigation, Sterile (FREE WATER) SOLN Place 200 mLs into feeding tube every 6 (six) hours. 01/31/19   Drema DallasWoods, Curtis J, MD    Family History Family History  Problem Relation Age of Onset   Hypertension Mother    Hypertension Father     Social History Social History   Tobacco Use   Smoking status: Former Smoker    Quit date: 09/28/2013    Years since quitting: 5.5   Smokeless tobacco: Never Used  Substance Use Topics   Alcohol use: Never    Frequency: Never   Drug use: Never     Allergies   Ace inhibitors   Review of Systems Review of Systems  Unable to perform ROS: Patient unresponsive  Constitutional: Positive for fever.  level 5 caveat - pt not verbally unresponsive     Physical Exam Updated Vital Signs There were no vitals taken for this visit.  Physical  Exam Vitals signs and nursing note reviewed.  Constitutional:      General: She is in acute distress.     Appearance: She is well-developed. She is ill-appearing.  HENT:     Head: Atraumatic.     Nose: Nose normal.     Mouth/Throat:     Mouth: Mucous membranes are moist.  Eyes:     General: No scleral icterus.    Conjunctiva/sclera: Conjunctivae normal.     Pupils: Pupils are equal, round, and reactive to light.  Neck:     Musculoskeletal: Normal range of motion and neck supple. No neck rigidity  or muscular tenderness.     Trachea: No tracheal deviation.     Comments: No stiffness or rigidity.  Cardiovascular:     Rate and Rhythm: Normal rate and regular rhythm.     Pulses: Normal pulses.     Heart sounds: Normal heart sounds. No murmur. No friction rub. No gallop.   Pulmonary:     Effort: Respiratory distress present.     Breath sounds: Rhonchi present.  Abdominal:     General: Bowel sounds are normal. There is no distension.     Palpations: Abdomen is soft.     Tenderness: There is no abdominal tenderness. There is no guarding.  Genitourinary:    Comments: No cva tenderness.  Musculoskeletal:        General: No swelling.     Comments: Sacral wound, approx 10 cm diameter, no necrotic tissue, malodor, or purulent d/c.   Skin:    General: Skin is warm and dry.     Findings: No rash.  Neurological:     Comments: Awake. Lethargic/slow to respond. Moves bil ext.   Psychiatric:     Comments: Lethargic, ill appearing.       ED Treatments / Results  Labs (all labs ordered are listed, but only abnormal results are displayed) Results for orders placed or performed during the hospital encounter of May 05, 2019  SARS Coronavirus 2  Result Value Ref Range   SARS Coronavirus 2 NOT DETECTED NOT DETECTED  Lactic acid, plasma  Result Value Ref Range   Lactic Acid, Venous 3.7 (HH) 0.5 - 1.9 mmol/L  CBC with Differential  Result Value Ref Range   WBC 12.8 (H) 4.0 - 10.5 K/uL   RBC  3.48 (L) 3.87 - 5.11 MIL/uL   Hemoglobin 10.5 (L) 12.0 - 15.0 g/dL   HCT 16.1 (L) 09.6 - 04.5 %   MCV 101.7 (H) 80.0 - 100.0 fL   MCH 30.2 26.0 - 34.0 pg   MCHC 29.7 (L) 30.0 - 36.0 g/dL   RDW 40.9 (H) 81.1 - 91.4 %   Platelets PLATELET CLUMPS NOTED ON SMEAR, UNABLE TO ESTIMATE 150 - 400 K/uL   nRBC 0.3 (H) 0.0 - 0.2 %   Neutrophils Relative % 75 %   Neutro Abs 9.6 (H) 1.7 - 7.7 K/uL   Lymphocytes Relative 15 %   Lymphs Abs 1.9 0.7 - 4.0 K/uL   Monocytes Relative 8 %   Monocytes Absolute 1.0 0.1 - 1.0 K/uL   Eosinophils Relative 0 %   Eosinophils Absolute 0.0 0.0 - 0.5 K/uL   Basophils Relative 1 %   Basophils Absolute 0.1 0.0 - 0.1 K/uL   Immature Granulocytes 1 %   Abs Immature Granulocytes 0.11 (H) 0.00 - 0.07 K/uL   Polychromasia PRESENT   Urinalysis, Routine w reflex microscopic  Result Value Ref Range   Color, Urine AMBER (A) YELLOW   APPearance TURBID (A) CLEAR   Specific Gravity, Urine 1.018 1.005 - 1.030   pH 5.0 5.0 - 8.0   Glucose, UA NEGATIVE NEGATIVE mg/dL   Hgb urine dipstick LARGE (A) NEGATIVE   Bilirubin Urine NEGATIVE NEGATIVE   Ketones, ur NEGATIVE NEGATIVE mg/dL   Protein, ur 782 (A) NEGATIVE mg/dL   Nitrite NEGATIVE NEGATIVE   Leukocytes,Ua LARGE (A) NEGATIVE   RBC / HPF 6-10 0 - 5 RBC/hpf   WBC, UA 21-50 0 - 5 WBC/hpf   Bacteria, UA MANY (A) NONE SEEN   Ca Oxalate Crys, UA PRESENT    Non Squamous Epithelial  0-5 (A) NONE SEEN  Protime-INR  Result Value Ref Range   Prothrombin Time 16.6 (H) 11.4 - 15.2 seconds   INR 1.4 (H) 0.8 - 1.2  Basic metabolic panel  Result Value Ref Range   Sodium 150 (H) 135 - 145 mmol/L   Potassium 4.5 3.5 - 5.1 mmol/L   Chloride 117 (H) 98 - 111 mmol/L   CO2 20 (L) 22 - 32 mmol/L   Glucose, Bld 355 (H) 70 - 99 mg/dL   BUN 85 (H) 8 - 23 mg/dL   Creatinine, Ser 1.612.46 (H) 0.44 - 1.00 mg/dL   Calcium 9.7 8.9 - 09.610.3 mg/dL   GFR calc non Af Amer 20 (L) >60 mL/min   GFR calc Af Amer 23 (L) >60 mL/min   Anion gap 13 5 - 15     Dg Abd 1 View  Result Date: 03/16/2019 CLINICAL DATA:  Abdominal pain, shortness of breath EXAM: ABDOMEN - 1 VIEW COMPARISON:  03/06/2019 FINDINGS: Nonobstructive bowel gas pattern. Gastrostomy overlying the gastric body. Mild degenerative changes of the lumbar spine. IMPRESSION: No evidence of small bowel obstruction or free air. Gastrostomy overlying the gastric body. Electronically Signed   By: Charline BillsSriyesh  Krishnan M.D.   On: 03/16/2019 08:47   Ct Head Wo Contrast  Result Date: 03/21/2019 CLINICAL DATA:  Altered mental status EXAM: CT HEAD WITHOUT CONTRAST TECHNIQUE: Contiguous axial images were obtained from the base of the skull through the vertex without intravenous contrast. COMPARISON:  CT head neck 01/02/2019 FINDINGS: Brain: There is no mass, hemorrhage or extra-axial collection. There is generalized atrophy without lobar predilection. Hypodensity of the white matter is most commonly associated with chronic microvascular disease. Old right basal ganglia infarct. Vascular: No abnormal hyperdensity of the major intracranial arteries or dural venous sinuses. No intracranial atherosclerosis. Skull: The visualized skull base, calvarium and extracranial soft tissues are normal. Sinuses/Orbits: No fluid levels or advanced mucosal thickening of the visualized paranasal sinuses. No mastoid or middle ear effusion. The orbits are normal. IMPRESSION: Atrophy chronic ischemic microangiopathy and old basal ganglia infarct without acute intracranial abnormality. Electronically Signed   By: Deatra RobinsonKevin  Herman M.D.   On: 03/21/2019 16:47   Dg Chest Port 1 View  Result Date: 12/28/18 CLINICAL DATA:  Sepsis. EXAM: PORTABLE CHEST 1 VIEW COMPARISON:  Chest x-ray dated Mar 18, 2019. FINDINGS: The heart size and mediastinal contours are within normal limits. Normal pulmonary vascularity. Retrocardiac opacity with silhouetting of the left hemidiaphragm. Unchanged linear atelectasis/scarring at the peripheral left lung  base. Improved aeration of the right lung with mild residual opacity in the medial right lung base. No pleural effusion or pneumothorax. No acute osseous abnormality. IMPRESSION: 1. Left lower lobe pneumonia. 2. Improved right-sided pneumonia with mild residual atelectasis or infiltrate at the medial right lung base. Electronically Signed   By: Obie DredgeWilliam T Derry M.D.   On: 002/29/20 12:33   Dg Chest Port 1 View  Result Date: 03/18/2019 CLINICAL DATA:  Pneumonia EXAM: PORTABLE CHEST 1 VIEW COMPARISON:  Two days ago FINDINGS: Increased opacification on the right, especially the upper lobe. The left lung remains relatively clear. Borderline heart size. No effusion or pneumothorax. IMPRESSION: Increased right-sided airspace disease/pneumonia. Electronically Signed   By: Marnee SpringJonathon  Watts M.D.   On: 03/18/2019 07:19   Dg Chest Port 1 View  Result Date: 03/16/2019 CLINICAL DATA:  Aspiration EXAM: PORTABLE CHEST 1 VIEW COMPARISON:  02/22/2019; 02/01/2019 FINDINGS: Grossly unchanged cardiac silhouette and mediastinal contours. Interval removal of tracheostomy tube. No pneumothorax. Interval  development of fairly extensive ill-defined heterogeneous airspace opacities within the right mid and lower lung. Left basilar/retrocardiac opacities are unchanged may represent atelectasis. No pleural effusion or pneumothorax. No evidence of edema. No acute osseous abnormalities. IMPRESSION: 1. Right mid and lower lung heterogeneous airspace opacities worrisome for provided history of recent aspiration. A follow-up chest radiograph in 3 to 4 weeks after treatment is recommended to ensure resolution. 2. Interval tracheostomy removal. Electronically Signed   By: Simonne ComeJohn  Watts M.D.   On: 03/16/2019 08:48    EKG None  Radiology Dg Chest Port 1 View  Result Date: December 24, 2018 CLINICAL DATA:  Sepsis. EXAM: PORTABLE CHEST 1 VIEW COMPARISON:  Chest x-ray dated Mar 18, 2019. FINDINGS: The heart size and mediastinal contours are within  normal limits. Normal pulmonary vascularity. Retrocardiac opacity with silhouetting of the left hemidiaphragm. Unchanged linear atelectasis/scarring at the peripheral left lung base. Improved aeration of the right lung with mild residual opacity in the medial right lung base. No pleural effusion or pneumothorax. No acute osseous abnormality. IMPRESSION: 1. Left lower lobe pneumonia. 2. Improved right-sided pneumonia with mild residual atelectasis or infiltrate at the medial right lung base. Electronically Signed   By: Obie DredgeWilliam T Derry M.D.   On: 0February 25, 2020 12:33    Procedures Procedures (including critical care time)  Medications Ordered in ED Medications  ceFEPIme (MAXIPIME) 2 g in sodium chloride 0.9 % 100 mL IVPB (has no administration in time range)  vancomycin (VANCOCIN) IVPB 1000 mg/200 mL premix (has no administration in time range)  sodium chloride flush (NS) 0.9 % injection 3 mL (3 mLs Intravenous Given 2018-12-06 1217)     Initial Impression / Assessment and Plan / ED Course  I have reviewed the triage vital signs and the nursing notes.  Pertinent labs & imaging results that were available during my care of the patient were reviewed by me and considered in my medical decision making (see chart for details).  Iv ns bolus. Cultures sent. Stat labs. Stat pcxr.   2nd iv line. Iv ns bolus.   Reviewed nursing notes and prior charts for additional history.   Iv antibiotics. Code sepsis initiated.   cxr reviewed by me - LLL pna.   Labs reviewed by me - elevated lactate. Labs also c/w uti.   Iv ns boluses.   Patients bp improved post ivf.   Given tenuous resp status, and DNI/no CPR, and as bp improved/normal, feel 30cc/kg for patient may sent into resp failure with inability to intubate - therefore will give boluses ns/reassess vitals, reassess lactate.   Patient has DNI/no cpr form on chart. Pt also has MOST form wishing for medical therapy, hospital admission, etc.    Hospitalists consulted for admission - they indicates is unassigned, to call IM.  Discussed w IM resident - will admit.   CRITICAL CARE RE: acute uti, pna, acute on chronic respiratory failure, sepsis, hypotension.  Performed by: Suzi RootsKevin E Etta Gassett Total critical care time: 95 minutes Critical care time was exclusive of separately billable procedures and treating other patients. Critical care was necessary to treat or prevent imminent or life-threatening deterioration. Critical care was time spent personally by me on the following activities: development of treatment plan with patient and/or surrogate as well as nursing, discussions with consultants, evaluation of patient's response to treatment, examination of patient, obtaining history from patient or surrogate, ordering and performing treatments and interventions, ordering and review of laboratory studies, ordering and review of radiographic studies, pulse oximetry and re-evaluation of patient's condition.  Final Clinical Impressions(s) / ED Diagnoses   Final diagnoses:  Sepsis [facility])    ED Discharge Orders    None            Lajean Saver, MD 04-20-2019 1456

## 2019-04-30 NOTE — Progress Notes (Signed)
Pt arrived to floor from Ed on [facility] at 52mcg.Pts BP 83/58, HR 110 and unable to pick up o2 sats from any sites. Pt intubated and unresponsive. Pt rapidly declined and BP continued to drop, Elink paged for additional vasopressor to be ordered. Md stated that family was on the way to hospital and a discussion would be had about escalation of care due to pts DNR status and poor prognosis. Lab called and reported pH of 6.9, no new orders for additional vasopressors at the time. Pt begins to have ectopy and becomes bradycardic in 40's. Md called (Agarwala) and notified of new bradycardia. BP became undetectable, despite several attempts, pt with faint pulse and Md advised to allow pt to pass if pt became pulseless due to active DNR status. Pt became asystole and pronounced by this RN and Ginger, Therapist, sports at [employer]. ETT Md notified and asked MD to contact family. MD stated he was unable to do so. Elink contacted and Eddie Dibbles, NP came to unit to notify pts family of pt passing. Report given to next nurse and family visiting with pt at this time. Chaplain called and support given.

## 2019-04-30 NOTE — Procedures (Signed)
Central Venous Catheter Insertion Procedure Note Erika Wang [id number] April 01, 1951  Procedure: Insertion of Central Venous Catheter Indications: Assessment of intravascular volume, Drug and/or fluid administration and Frequent blood sampling  Procedure Details Consent: Risks of procedure as well as the alternatives and risks of each were explained to the (patient/caregiver).  Consent for procedure obtained.   Time Out: Verified patient identification, verified procedure, site/side was marked, verified correct patient position, special equipment/implants available, medications/allergies/relevent history reviewed, required imaging and test results available.  Performed  Maximum sterile technique was used including antiseptics, cap, gloves, gown, hand hygiene, mask and sheet. Skin prep: Chlorhexidine; local anesthetic administered A triple lumen catheter was placed in the right internal jugular vein using the Seldinger technique.  Bippatch applied, sutured in place x3, sterile dressing applied.   Evaluation Blood flow good Complications: No apparent complications Patient did tolerate procedure well. Chest X-ray ordered to verify placement.  CXR: reviewed, line in good position, no pneumothorax.   Procedure performed under direct supervision of Dr. Lynetta Mare and with ultrasound guidance for real time vessel cannulation.      Erika Gens, NP-C [facility] Pgr: [phone] or if no answer [phone] 05/06/19, 4:09 PM

## 2019-04-30 NOTE — ED Notes (Addendum)
EDP Steinel was requested & did come to pt room d/t pts BP getting lower, fluids & ABT started. EDP also seen the sacral wound with assistance in rolling pt, no odor detected.

## 2019-04-30 NOTE — Death Summary Note (Signed)
DEATH SUMMARY   Patient Details  Name: Erika Wang MRN: [id number] DOB: May 20, 1951  Admission/Discharge Information   Admit Date:  25-Apr-2019  Date of Death: Date of Death: 04-25-19  Time of Death: Time of Death: 1855  Length of Stay: 1  Referring Physician: Patient, No Pcp Per   Reason(s) for Hospitalization  Respiratory failure  Diagnoses  Preliminary cause of death: ARDS Secondary Diagnoses (including complications and co-morbidities):  Active Problems:   Aspiration pneumonia [facility])   Brief Hospital Course (including significant findings, care, treatment, and services provided and events leading to death)  Erika Wang is a 68 y.o. year old female who was admitted with severe dyspnea, obtundation and hypoxia secondary to an aspiration event. There was copious gastric secretions and partially digested food in the posterior oropharynx on intubation.  Patient had previously survived a prolonged ICU stay for status epilepticus and was frail. In spite of initiation of lung protective ventilation she developed worsening hypoxia and hypotension requiring escalating vasopressors.  The family was notified to come in, but the patient developed profound hypoxia, hypotension and bradycardia just prior to their arrival and she passed away at [place].  Pertinent Labs and Studies  Significant Diagnostic Studies Dg Abd 1 View  Result Date: 03/16/2019 CLINICAL DATA:  Abdominal pain, shortness of breath EXAM: ABDOMEN - 1 VIEW COMPARISON:  03/06/2019 FINDINGS: Nonobstructive bowel gas pattern. Gastrostomy overlying the gastric body. Mild degenerative changes of the lumbar spine. IMPRESSION: No evidence of small bowel obstruction or free air. Gastrostomy overlying the gastric body. Electronically Signed   By: Julian Hy M.D.   On: 03/16/2019 08:47   Ct Head Wo Contrast  Result Date: 03/21/2019 CLINICAL DATA:  Altered mental status EXAM: CT HEAD WITHOUT CONTRAST TECHNIQUE: Contiguous  axial images were obtained from the base of the skull through the vertex without intravenous contrast. COMPARISON:  CT head neck 01/02/2019 FINDINGS: Brain: There is no mass, hemorrhage or extra-axial collection. There is generalized atrophy without lobar predilection. Hypodensity of the white matter is most commonly associated with chronic microvascular disease. Old right basal ganglia infarct. Vascular: No abnormal hyperdensity of the major intracranial arteries or dural venous sinuses. No intracranial atherosclerosis. Skull: The visualized skull base, calvarium and extracranial soft tissues are normal. Sinuses/Orbits: No fluid levels or advanced mucosal thickening of the visualized paranasal sinuses. No mastoid or middle ear effusion. The orbits are normal. IMPRESSION: Atrophy chronic ischemic microangiopathy and old basal ganglia infarct without acute intracranial abnormality. Electronically Signed   By: Ulyses Jarred M.D.   On: 03/21/2019 16:47   Dg Chest Port 1 View  Result Date: 2019/04/25 CLINICAL DATA:  Central line and endotracheal tube placement EXAM: PORTABLE CHEST 1 VIEW COMPARISON:  04-25-2019 FINDINGS: Support Apparatus: --Endotracheal tube: Tip 3.7 cm above the inferior margin of the carina. --Enteric tube:Sideport projects over the distal esophagus. Recommend advancing by 7 cm. --Catheter(s):Right internal jugular vein approach central venous catheter tip is in the lower SVC --Other: None Confluent airspace opacities in the left lung, worsened since the prior study. Particularly, there is an area of consolidation within the lingula. Basilar opacities in the right lung are also increased. IMPRESSION: 1. Worsening consolidation within the left lung. Possible superimposed pulmonary edema. 2. Enteric tube side port at the gastroesophageal junction. Advance by 7 cm to ensure the side port is within the stomach. Electronically Signed   By: Ulyses Jarred M.D.   On: 04/25/19 16:19   Dg Chest Port 1  View  Result Date: 2019-04-25 CLINICAL  DATA:  Sepsis. EXAM: PORTABLE CHEST 1 VIEW COMPARISON:  Chest x-ray dated Mar 18, 2019. FINDINGS: The heart size and mediastinal contours are within normal limits. Normal pulmonary vascularity. Retrocardiac opacity with silhouetting of the left hemidiaphragm. Unchanged linear atelectasis/scarring at the peripheral left lung base. Improved aeration of the right lung with mild residual opacity in the medial right lung base. No pleural effusion or pneumothorax. No acute osseous abnormality. IMPRESSION: 1. Left lower lobe pneumonia. 2. Improved right-sided pneumonia with mild residual atelectasis or infiltrate at the medial right lung base. Electronically Signed   By: Obie DredgeWilliam T Derry M.D.   On: Aug 05, 2019 12:33   Dg Chest Port 1 View  Result Date: 03/18/2019 CLINICAL DATA:  Pneumonia EXAM: PORTABLE CHEST 1 VIEW COMPARISON:  Two days ago FINDINGS: Increased opacification on the right, especially the upper lobe. The left lung remains relatively clear. Borderline heart size. No effusion or pneumothorax. IMPRESSION: Increased right-sided airspace disease/pneumonia. Electronically Signed   By: Marnee SpringJonathon  Watts M.D.   On: 03/18/2019 07:19   Dg Chest Port 1 View  Result Date: 03/16/2019 CLINICAL DATA:  Aspiration EXAM: PORTABLE CHEST 1 VIEW COMPARISON:  02/22/2019; 02/01/2019 FINDINGS: Grossly unchanged cardiac silhouette and mediastinal contours. Interval removal of tracheostomy tube. No pneumothorax. Interval development of fairly extensive ill-defined heterogeneous airspace opacities within the right mid and lower lung. Left basilar/retrocardiac opacities are unchanged may represent atelectasis. No pleural effusion or pneumothorax. No evidence of edema. No acute osseous abnormalities. IMPRESSION: 1. Right mid and lower lung heterogeneous airspace opacities worrisome for provided history of recent aspiration. A follow-up chest radiograph in 3 to 4 weeks after treatment is  recommended to ensure resolution. 2. Interval tracheostomy removal. Electronically Signed   By: Simonne ComeJohn  Watts M.D.   On: 03/16/2019 08:48    Microbiology Recent Results (from the past 240 hour(s))  SARS Coronavirus 2     Status: None   Collection Time: 11/01/18 11:45 AM  Result Value Ref Range Status   SARS Coronavirus 2 NOT DETECTED NOT DETECTED Final    Comment: (NOTE) SARS-CoV-2 target nucleic acids are NOT DETECTED. The SARS-CoV-2 RNA is generally detectable in upper and lower respiratory specimens during the acute phase of infection.  Negative  results do not preclude SARS-CoV-2 infection, do not rule out co-infections with other pathogens, and should not be used as the sole basis for treatment or other patient management decisions.  Negative results must be combined with clinical observations, patient history, and epidemiological information. The expected result is Not Detected. Fact Sheet for Patients: http://www.biofiredefense.com/wp-content/uploads/2020/03/BIOFIRE-COVID -19-patients.pdf Fact Sheet for Healthcare Providers: http://www.biofiredefense.com/wp-content/uploads/2020/03/BIOFIRE-COVID -19-hcp.pdf This test is not yet approved or cleared by the [place] FDA and  has been authorized for detection and/or diagnosis of SARS-CoV-2 by FDA under an Emergency Use Authorization (EUA).  This EUA will remain in effec t (meaning this test can be used) for the duration of  the COVID-19 declaration under Section 564(b)(1) of the Act, 21 U.S.C. section 360bbb-3(b)(1), unless the authorization is terminated or revoked sooner. Performed at [facility], [address] [place], [place] [address]   Culture, blood (Routine x 2)     Status: None (Preliminary result)   Collection Time: 11/01/18 11:57 AM   Specimen: BLOOD  Result Value Ref Range Status   Specimen Description BLOOD LEFT ANTECUBITAL  Final   Special Requests   Final    BOTTLES DRAWN AEROBIC AND ANAEROBIC  Blood Culture adequate volume   Culture   Final    NO GROWTH <  24 HOURS Performed at [facility], [address] [place], [place] [address]    Report Status PENDING  Incomplete  Culture, blood (Routine x 2)     Status: None (Preliminary result)   Collection Time: Mar 06, 2019 12:02 PM   Specimen: BLOOD  Result Value Ref Range Status   Specimen Description BLOOD RIGHT ANTECUBITAL  Final   Special Requests   Final    BOTTLES DRAWN AEROBIC AND ANAEROBIC Blood Culture adequate volume   Culture   Final    NO GROWTH < 24 HOURS Performed at [facility], [address] [place], [place] [address]    Report Status PENDING  Incomplete    Lab Basic Metabolic Panel: Recent Labs  Lab Mar 06, 2019 1351 Mar 06, 2019 1648  NA 150* 154*  K 4.5 4.2  CL 117*  --   CO2 20*  --   GLUCOSE 355*  --   BUN 85*  --   CREATININE 2.46*  --   CALCIUM 9.7  --    Liver Function Tests: No results for input(s): AST, ALT, ALKPHOS, BILITOT, PROT, ALBUMIN in the last 168 hours. No results for input(s): LIPASE, AMYLASE in the last 168 hours. No results for input(s): AMMONIA in the last 168 hours. CBC: Recent Labs  Lab Mar 06, 2019 1157 Mar 06, 2019 1648  WBC 12.8*  --   NEUTROABS 9.6*  --   HGB 10.5* 8.5*  HCT 35.4* 25.0*  MCV 101.7*  --   PLT PLATELET CLUMPS NOTED ON SMEAR, UNABLE TO ESTIMATE  --    Cardiac Enzymes: No results for input(s): CKTOTAL, CKMB, CKMBINDEX, TROPONINI in the last 168 hours. Sepsis Labs: Recent Labs  Lab Mar 06, 2019 1157 Mar 06, 2019 1301 Mar 06, 2019 1645  WBC 12.8*  --   --   LATICACIDVEN  --  3.7* 3.1*    Procedures/Operations  Mechanical ventilation, central venous catheter.   Denver Bentson 04/11/2019, 1:57 PM

## 2019-04-30 NOTE — ED Triage Notes (Signed)
Pt is here from [place] for eval of possible Sepsis d/t to a wound infection on her sacrum, pt arrived to ED on NRB @ 15L sating at 97%, Resp 37, Rectal/temp 104, BP 83/58.

## 2019-04-30 NOTE — Progress Notes (Signed)
Patient disconnected from the ventilator after time of death at 65.

## 2019-04-30 NOTE — ED Notes (Signed)
Respiratory at bedside, to deep suction her, after the vomiting incident.

## 2019-04-30 NOTE — ED Notes (Signed)
Pt was noted to have vomited under the NRB mask, her mouth was suctioned EDP was radioed to pt bedside, oxygen saturation was in the 70's, after a clean NRB mask & bumping it up to 15L O2 she was sating at 70's still.

## 2019-04-30 NOTE — H&P (Signed)
NAMEJacqui Wang, MRN:  [id number], DOB:  [birth year], LOS: 0 ADMISSION DATE:  May 03, 2019, CONSULTATION DATE:  96222 REFERRING MD:  Ashok Cordia  CHIEF COMPLAINT:  Desaturations   Brief History   Erika Wang is a 68 y.o. female who was brought to [facility] ED from [facility] for fever and hypotension.  Required intubation in ED for persistent hypoxia following vomiting episode.  History of present illness   Pt is encephelopathic; therefore, this HPI is obtained from chart review. Erika Wang is a 68 y.o. female who resides at [facility] and has a PMH including but not limited to CVA, status epilepticus, anoxic encephalopathy, HTN, HLD (see "past medical history" for rest).  She presented to [facility] 6/11 with concerns of sepsis due to fever to 104 and hypotension with SBP in 80s.  In ED, she was also hypoxic and required NRB.  After NRB application, she had vomiting episode followed by worsening desaturations without improvement despite increased O2.  She subsequently required intubation for persistent hypoxia and airway protection.  Code status was discussed by IMTS residents and pt's daughter who confirmed that pt is partial code with NO CPR, but full supportive care otherwise.  Following intubation, pt's SBP dropped into 50's.  She was started on levophed and CVL was placed emergently.  Of note, she had recent admission 01/02/19 through 01/31/19 for refractory status epilepticus felt to be due to previous CVA and required burst suppression (discharged on vimpat '200mg'$  BID, keppra '1500mg'$  BID, phenytoin '75mg'$  TID, topamax '175mg'$  BID, ativan PRN) and respiratory failure requiring trach.  Palliative care met with family that admit and daughter opted for continued aggressive therapy with full code.  Past Medical History  Refractory status epilepticus, CVA, encephalopathy, HTN, HLD, DM.  Significant Hospital Events   6/11 > admit.  Consults:  Palliative.  Procedures:  ETT 6/11 > R IJ CVL  6/11 >   Significant Diagnostic Tests:  CXR 6/11 > left lower lobe opacity.  Micro Data:  Blood 6/11 >  Sputum 6/11 >  Urine 6/11 >  SARS CoV2 6/11 > neg.  Antimicrobials:  Vanc 6/11 >  Zosyn 6/11 >  Cefepime x 1 6/11   Interim history/subjective:  Just intubated.  On 54mg levophed, SBP in 70s.  Objective:  Blood pressure (!) 129/56, pulse (!) 116, temperature (!) 104 F (40 C), temperature source Rectal, resp. rate 16, height 5' (1.524 m), weight 56.7 kg, SpO2 92 %.    Vent Mode: PRVC FiO2 (%):  [100 %] 100 % Set Rate:  [16 bmp] 16 bmp Vt Set:  [360 mL] 360 mL PEEP:  [10 cmH20] 10 cmH20 Plateau Pressure:  [27 cmH20] 27 cmH20   Intake/Output Summary (Last 24 hours) at 6Jul 04, [id number] Last data filed at 62020/07/[id number] Gross per 24 hour  Intake 0 ml  Output -  Net 0 ml   Filed Weights   [id number]  Weight: 56.7 kg    Examination: General: Chronically ill appearing female, just intubated. Neuro: Just intubated, not following commands. HEENT: [place]/AT. Sclerae anicteric.  ETT in place. Cardiovascular: RRR, no M/R/G.  Lungs: Respirations even and unlabored.  Coarse bilaterally. Abdomen: PEG C/D/I.  Abd soft, NT/ND.  Musculoskeletal: Bilateral ankle relief boots.  No gross deformities, no edema.  Skin: Intact, warm, no rashes.  Assessment & Plan:   Acute hypoxic respiratory failure - multifactorial due to HCAP + aspiration (purulent secretions noted during intubation). - Full vent support. - Assess ABG. - Bronchial hygiene. - Empiric  abx as below. - Follow CXR.  Sepsis with shock physiology (exacerbated after RSI).  Multifactorial due to HCAP / aspiration and UTI. - Empiric vanc / zosyn. - Follow cultures. - Trend lactate. - Continue levophed as needed to maintain goal MAP > 65.  Hypernatremia - presumed 2/2 dehydration.  FWD ~2L. AKI - presumed pre-renal 2/2 hypovolemia / dehydration. - Free water per tube. - Aggressive hydration. - Follow BMP.  Hx  DM. - SSI. - Hold preadmission novolog.  Hx status epilepticus, stroke, anoxic encephalopathy. - Continue preadmission lacosamide, levetiracetam, phenytoin, topiramate. - Assess levels.  Hx HTN, HLD. - Continue preadmission ASA. - Hold preadmission amlodipine, diltiazem, hydralazine  Hx CVA, encephalopathy. - Hold preadmission amantadine.  Best Practice:  Diet: NPO. Pain/Anxiety/Delirium protocol (if indicated): Fentanyl PRN / Midazolam PRN.  RASS goal -1. VAP protocol (if indicated): In place. DVT prophylaxis: SCD's / Heparin. GI prophylaxis: PPI. Glucose control: SSI. Mobility: Bedrest. Code Status: Limited - NO CPR. Family Communication: Daughter Charlena Cross updated over the phone.  We discussed pt's previous admission and current debilitated state.  Also dicussed readmission with need for ventilatory support.  Daughter emotional as she is sadly the only family member involved in pt's life.  She has come to the point where she realizes and understands that her mother is chronically ill and debilitated.  She also realizes the poor prognosis and extremely poor quality of life if we were able to liberate pt from vent again.  She is willing to and interested in speaking with palliative care about transitioning to a one way extubation. Disposition: ICU.  Labs   CBC: Recent Labs  Lab April 12, 2019 1157  WBC 12.8*  NEUTROABS 9.6*  HGB 10.5*  HCT 35.4*  MCV 101.7*  PLT PLATELET CLUMPS NOTED ON SMEAR, UNABLE TO ESTIMATE   Basic Metabolic Panel: Recent Labs  Lab 04/12/2019 1351  NA 150*  K 4.5  CL 117*  CO2 20*  GLUCOSE 355*  BUN 85*  CREATININE 2.46*  CALCIUM 9.7   GFR: Estimated Creatinine Clearance: 17.8 mL/min (A) (by C-G formula based on SCr of 2.46 mg/dL (H)). Recent Labs  Lab 12-Apr-2019 1157 April 12, 2019 1301  WBC 12.8*  --   LATICACIDVEN  --  3.7*   Liver Function Tests: No results for input(s): AST, ALT, ALKPHOS, BILITOT, PROT, ALBUMIN in the last 168 hours. No results  for input(s): LIPASE, AMYLASE in the last 168 hours. No results for input(s): AMMONIA in the last 168 hours. ABG    Component Value Date/Time   PHART 7.380 01/03/2019 0051   PCO2ART 44.8 01/03/2019 0051   PO2ART 432.0 (H) 01/03/2019 0051   HCO3 26.6 01/03/2019 0051   TCO2 28 01/03/2019 0051   O2SAT 100.0 01/03/2019 0051    Coagulation Profile: Recent Labs  Lab 2019/04/12 1300  INR 1.4*   Cardiac Enzymes: No results for input(s): CKTOTAL, CKMB, CKMBINDEX, TROPONINI in the last 168 hours. HbA1C: Hgb A1c MFr Bld  Date/Time Value Ref Range Status  02/02/2019 07:08 AM 6.1 (H) 4.8 - 5.6 % Final    Comment:    (NOTE) Pre diabetes:          5.7%-6.4% Diabetes:              >6.4% Glycemic control for   <7.0% adults with diabetes   01/03/2019 02:39 AM 5.9 (H) 4.8 - 5.6 % Final    Comment:    (NOTE) Pre diabetes:          5.7%-6.4% Diabetes:              >  6.4% Glycemic control for   <7.0% adults with diabetes    CBG: No results for input(s): GLUCAP in the last 168 hours.  Review of Systems:   Unable to obtain as pt is encephalopathic.  Past medical history  She,  has a past medical history of Acute on chronic respiratory failure with hypoxia ([place]), Anoxic encephalopathy ([place]), Aspiration pneumonia due to gastric secretions ([place]), High cholesterol, Hypertension, Status epilepticus due to refractory epilepsy ([place]), and Stroke ([place]).   Surgical History    Past Surgical History:  Procedure Laterality Date  . CESAREAN SECTION    . IR GASTROSTOMY TUBE MOD SED  02/19/2019     Social History   reports that she quit smoking about 5 years ago. She has never used smokeless tobacco. She reports that she does not drink alcohol or use drugs.   Family history   Her family history includes Hypertension in her father and mother.   Allergies Allergies  Allergen Reactions  . Ace Inhibitors Other (See Comments)    Acute renal failure     Home meds  Prior to Admission medications    Medication Sig Start Date End Date Taking? Authorizing Provider  amLODipine (NORVASC) 10 MG tablet Place 10 mg into feeding tube daily.   Yes [provider]  clindamycin (CLEOCIN) 300 MG capsule Place 300 mg into feeding tube 3 (three) times daily. 03/25/19 04/20/19 Yes [provider]  Guar Gum (NUTRISOURCE FIBER) POWD Place 1 packet into feeding tube 3 (three) times daily.   Yes [provider]  Melatonin 3 MG TABS Place 3 mg into feeding tube at bedtime.   Yes [provider]  Multiple Vitamins-Minerals (CEROVITE SENIOR PO) Take 1 tablet by mouth every morning.   Yes [provider]  Omega-3 Fatty Acids (FISH OIL) 1000 MG CAPS Take 1,000 mg by mouth every morning.   Yes [provider]  phenytoin (DILANTIN) 125 MG/5ML suspension Take 3 mLs (75 mg total) by mouth 3 (three) times daily. Patient taking differently: Take 50 mg by mouth 3 (three) times daily.  01/31/19  Yes Allie Bossier, MD  Probiotic Product (PROBIOTIC DAILY PO) Give 1 capsule by tube daily.   Yes [provider]  acetaminophen (TYLENOL) 160 MG/5ML solution Place 20.3 mLs (650 mg total) into feeding tube every 6 (six) hours as needed for mild pain, headache or fever. 01/31/19   Allie Bossier, MD  albuterol (PROVENTIL) (2.5 MG/3ML) 0.083% nebulizer solution Take 3 mLs (2.5 mg total) by nebulization every 2 (two) hours as needed for wheezing. 01/31/19   Allie Bossier, MD  amantadine (SYMMETREL) 50 MG/5ML solution Take 10 mLs (100 mg total) by mouth daily. 01/31/19   Allie Bossier, MD  Amino Acids-Protein Hydrolys (FEEDING SUPPLEMENT, PRO-STAT SUGAR FREE 64,) LIQD Take 30 mLs by mouth 2 (two) times daily. 01/31/19   Allie Bossier, MD  aspirin EC 81 MG tablet Take 1 tablet (81 mg total) by mouth daily. 01/31/19 01/31/20  Allie Bossier, MD  diltiazem (CARDIZEM) 10 mg/ml oral suspension Take 6 mLs (60 mg total) by mouth 4 (four) times daily. 01/31/19   Allie Bossier, MD   docusate (COLACE) 50 MG/5ML liquid Take 10 mLs (100 mg total) by mouth 2 (two) times daily. 01/31/19   Allie Bossier, MD  enoxaparin (LOVENOX) 40 MG/0.4ML injection Inject 0.4 mLs (40 mg total) into the skin daily for 5 days. 01/31/19 02/05/19  Allie Bossier, MD  hydrALAZINE (APRESOLINE) 20  MG/ML injection Inject 0.5 mLs (10 mg total) into the vein every 6 (six) hours as needed (SBP>210). 01/31/19   Allie Bossier, MD  Hydrocortisone (GERHARDT'S BUTT CREAM) CREA Apply 1 application topically 2 (two) times daily. 01/31/19   Allie Bossier, MD  insulin aspart (NOVOLOG) 100 UNIT/ML injection Moderate SSI 01/31/19   Allie Bossier, MD  lacosamide (VIMPAT) 10 MG/ML oral solution Take 20 mLs (200 mg total) by mouth 2 (two) times daily. 01/31/19   Allie Bossier, MD  levETIRAcetam (KEPPRA) 100 MG/ML solution Take 15 mLs (1,500 mg total) by mouth 2 (two) times daily. 01/31/19   Allie Bossier, MD  LORazepam (ATIVAN) 2 MG/ML injection Inject 0.5 mLs (1 mg total) into the vein every 6 (six) hours as needed for seizure. 01/31/19   Allie Bossier, MD  mouth rinse LIQD solution 15 mLs by Mouth Rinse route 6 (six) times daily. 01/31/19   Allie Bossier, MD  Multiple Vitamin (MULTIVITAMIN) LIQD Place 15 mLs into feeding tube daily. 02/01/19   Allie Bossier, MD  Nutritional Supplements (FEEDING SUPPLEMENT, JEVITY 1.2 CAL,) LIQD 23m/hr 01/31/19   WAllie Bossier MD  pantoprazole sodium (PROTONIX) 40 mg/20 mL PACK Place 20 mLs (40 mg total) into feeding tube daily. 01/31/19   WAllie Bossier MD  topiramate (TOPAMAX) 100 MG tablet Place 1 tablet (100 mg total) into feeding tube 2 (two) times daily. 01/31/19 01/31/20  WAllie Bossier MD  topiramate (TOPAMAX) 25 MG tablet Place 3 tablets (75 mg total) into feeding tube 2 (two) times daily. 01/31/19   WAllie Bossier MD  Water For Irrigation, Sterile (FREE WATER) SOLN Place 200 mLs into feeding tube every 6 (six) hours. 01/31/19   WAllie Bossier MD    Critical care time: 50 min.     RMontey Hora PSan BuenaventuraPulmonary & Critical Care Medicine Pager: [phone]  If no answer, [phone]/30/2020 3:46 PM

## 2019-04-30 NOTE — ED Notes (Signed)
OG tube connected to intermittent-low suction by this RN & Respiratory Therapy at bedside at this time to suction pt d/t lowering O2 sat noted of 89%.

## 2019-04-30 NOTE — ED Notes (Addendum)
This RN acting as Art therapist and called pt's dtr, next of kin to ensure she has been updated. Pt's dtr states she has spoken with the MD and is on her way to the hospital as she was instructed by the MD. She has been updated on pt's care from her conversation with the EDP.

## 2019-04-30 NOTE — Progress Notes (Signed)
Pharmacy Antibiotic Note  Erika Wang is a 68 y.o. female admitted on Apr 25, 2019 with sepsis. Pharmacy has been consulted for vancomycin and cefepime dosing. Pt is febrile with Tmax 104 and WBC is elevated at 12.8. Scr is elevated well above baseline at 2.46. Lactic acid is elevated at 3.7. Patient now intubated  Plan: Continue Vanc Cefepime DC per CCM Start Zosyn 2.25 every 6 hours F/u renal fxn, C&S, clinical status and peak/trough at SS  Height: 5' (152.4 cm) Weight: 125 lb (56.7 kg) IBW/kg (Calculated) : 45.5  Temp (24hrs), Avg:104 F (40 C), Min:104 F (40 C), Max:104 F (40 C)  Recent Labs  Lab April 25, 2019 1157 04-25-19 1301 04-25-19 1351  WBC 12.8*  --   --   CREATININE  --   --  2.46*  LATICACIDVEN  --  3.7*  --     Estimated Creatinine Clearance: 17.8 mL/min (A) (by C-G formula based on SCr of 2.46 mg/dL (H)).    Allergies  Allergen Reactions  . Ace Inhibitors Other (See Comments)    Acute renal failure    Antimicrobials this admission: Vanc 6/11>> Cefepime 6/11>>6/11 Zosyn 6/11 >>  Dose adjustments this admission: N/A  Microbiology results: Pending  Thank you for allowing pharmacy to be a part of this patient's care.  Janae Bridgeman, PharmD PGY1 Pharmacy Resident Phone: [phone] April 25, 2019 3:43 PM

## 2019-04-30 NOTE — Progress Notes (Signed)
Responded to page for Family support with death of Pt. Family was at bedside speaking to Nurse. I offered spiritual care with words of comfort, ministry of presence, prayer and empathic listening. Daughter and son-in -law were thankful for the staff care and Chaplain presence. Nurse gave Daughter the patient Placement number.  Chaplain Fidel Levy  [phone]

## 2019-04-30 NOTE — Progress Notes (Signed)
PCCM INTERVAL PROGRESS NOTE   Was called by Warren Lacy after staff RN had made them aware of patient's death. Upon my arrival to the unit family was just returning from dinner. I informed them to the best of my knowledge of the events leading to her death. Daughter Charlena Cross was surprised by her passing and very tearful, but understands how sick she was.     Georgann Housekeeper, AGACNP-BC [facility] Pager [phone] or [phone]  2019/05/05 7:59 PM

## 2019-04-30 NOTE — Procedures (Signed)
Intubation Procedure Note Starla Deller [id number] October 23, 1951  Procedure: Intubation Indications: Respiratory insufficiency  Procedure Details Consent: Risks of procedure as well as the alternatives and risks of each were explained to the (patient/caregiver).  Consent for procedure obtained. Time Out: Verified patient identification, verified procedure, site/side was marked, verified correct patient position, special equipment/implants available, medications/allergies/relevent history reviewed, required imaging and test results available.  Performed  Maximum sterile technique was used including antiseptics, cap, gloves, gown, hand hygiene, mask and sheet.  MAC    Evaluation Hemodynamic Status: BP stable throughout; O2 sats: stable throughout Patient's Current Condition: stable Complications: No apparent complications Patient did tolerate procedure well. Chest X-ray ordered to verify placement.  CXR: pending.   Jennet Maduro 05/09/2019

## 2019-04-30 NOTE — Progress Notes (Signed)
RT called to bedside to NTS patient.

## 2019-04-30 NOTE — ED Notes (Signed)
[name] ICU NP at bedside to insert a central lumen central line.

## 2019-04-30 NOTE — Progress Notes (Signed)
[facility] notified of pt's passing. Referral number [id number]. Pt ruled out for donation. Bedside RN Altha Harm notified of this.

## 2019-04-30 NOTE — Progress Notes (Signed)
Pharmacy Antibiotic Note  Erika Wang is a 68 y.o. female admitted on May 07, 2019 with sepsis. Pharmacy has been consulted for vancomycin and cefepime dosing. Pt is febrile with Tmax 104 and WBC is elevated at 12.8. Scr is elevated well above baseline at 2.46. Lactic acid is elevated at 3.7.   Plan: Cefepime 2gm IV Q24H Vancomycin 1gm IV x 1 then trend Scr for further doses F/u renal fxn, C&S, clinical status and peak/trough at SS  Height: 5' (152.4 cm) Weight: 125 lb (56.7 kg) IBW/kg (Calculated) : 45.5  Temp (24hrs), Avg:104 F (40 C), Min:104 F (40 C), Max:104 F (40 C)  Recent Labs  Lab May 07, 2019 1157 May 07, 2019 1301 05/07/19 1351  WBC 12.8*  --   --   CREATININE  --   --  2.46*  LATICACIDVEN  --  3.7*  --     Estimated Creatinine Clearance: 17.8 mL/min (A) (by C-G formula based on SCr of 2.46 mg/dL (H)).    Allergies  Allergen Reactions  . Ace Inhibitors Other (See Comments)    Acute renal failure    Antimicrobials this admission: Vanc 6/11>> Cefepime 6/11>>  Dose adjustments this admission: N/A  Microbiology results: Pending  Thank you for allowing pharmacy to be a part of this patient's care.  Mikaele Stecher, Rande Lawman 05-07-19 12:21 PM

## 2019-04-30 DEATH — deceased

## 2019-07-01 IMAGING — CT CT ABDOMEN AND PELVIS WITH CONTRAST
2 of 5 series · 15 of 46 positions shown, 17 images · IV contrast (omnipaque)
Comparison: CT Abdomen and Pelvis without contrast 02/03/2019.

CLINICAL DATA: 68-year-old female ?PEG position, abdominal wall
cellulitis?.

EXAM:
CT ABDOMEN AND PELVIS WITH CONTRAST
TECHNIQUE: Multidetector CT imaging of the abdomen and pelvis was performed
using the standard protocol following bolus administration of
intravenous contrast.
CONTRAST:  100mL OMNIPAQUE IOHEXOL 300 MG/ML  SOLN

[Series 3: abdomen 5.0 · axial · 0.73mm/px · z∈[-62,+323]mm · 12 of 89 slices shown, 14 images]
[im 6/89  soft-tissue]
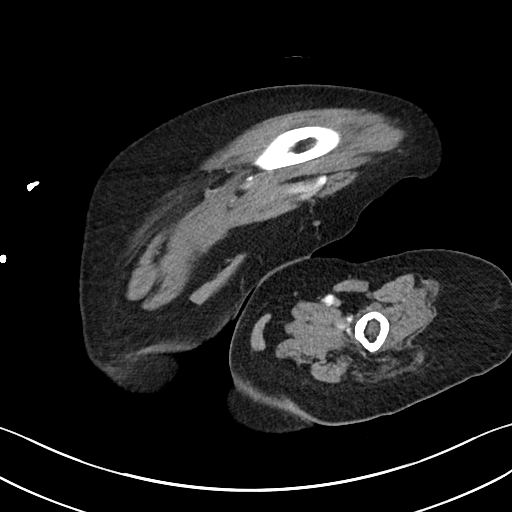
[im 6/89  bone]
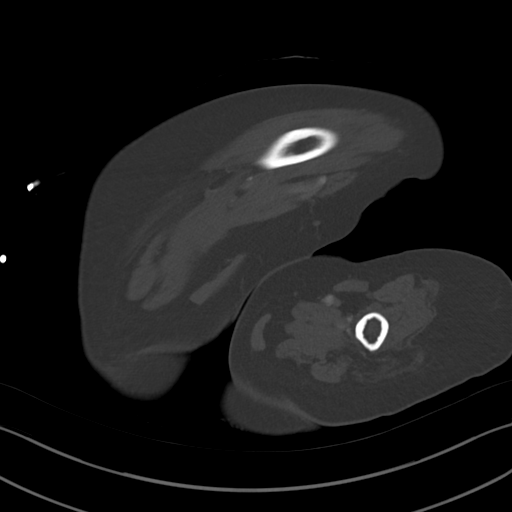
[im 12/89  soft-tissue]
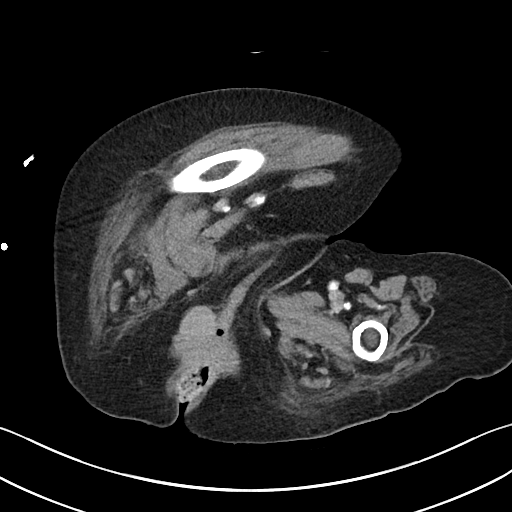
[im 23/89  soft-tissue]
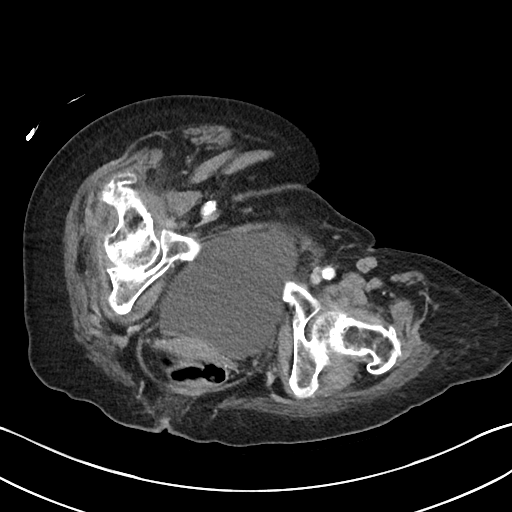
[im 28/89  soft-tissue]
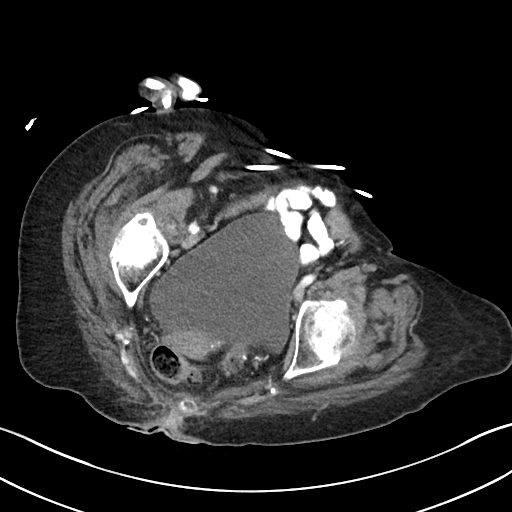
[im 34/89  soft-tissue]
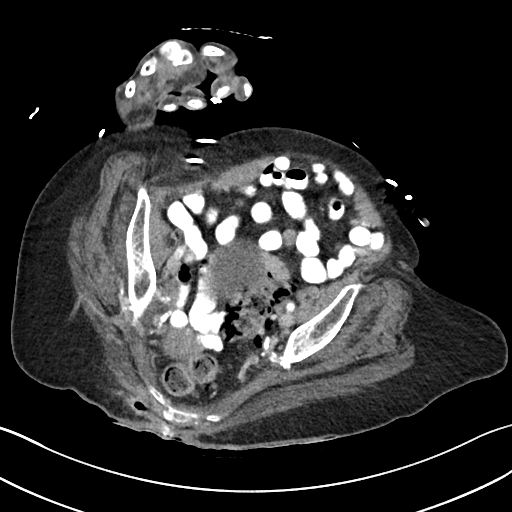
[im 39/89  soft-tissue]
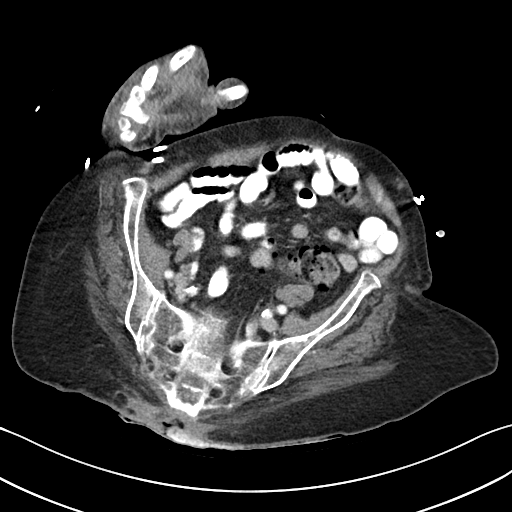
[im 50/89  soft-tissue]
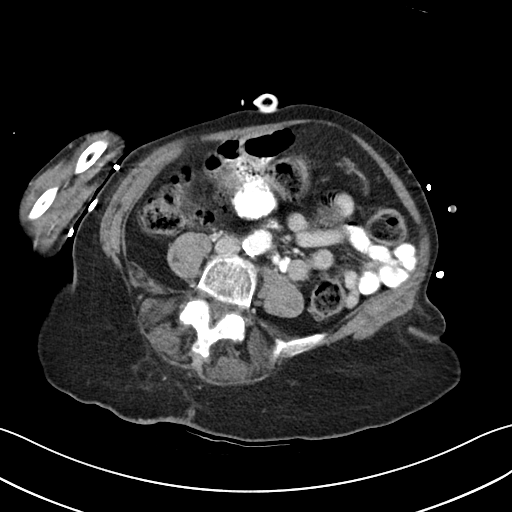
[im 56/89  soft-tissue]
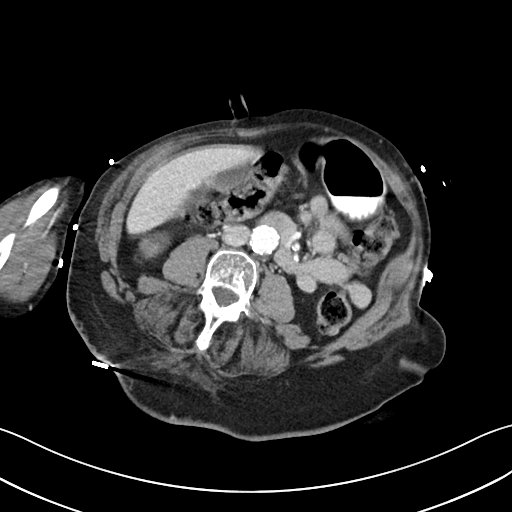
[im 61/89  soft-tissue]
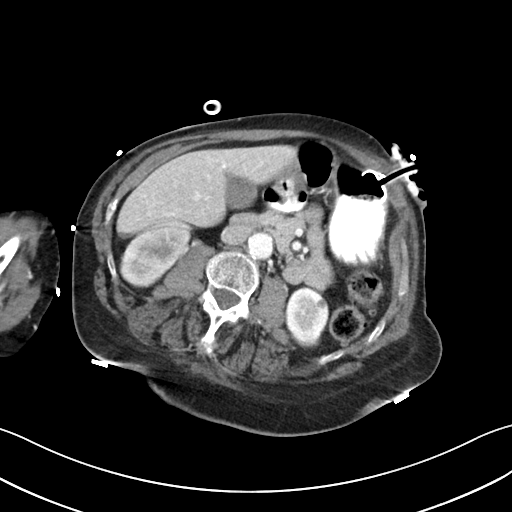
[im 61/89  bone]
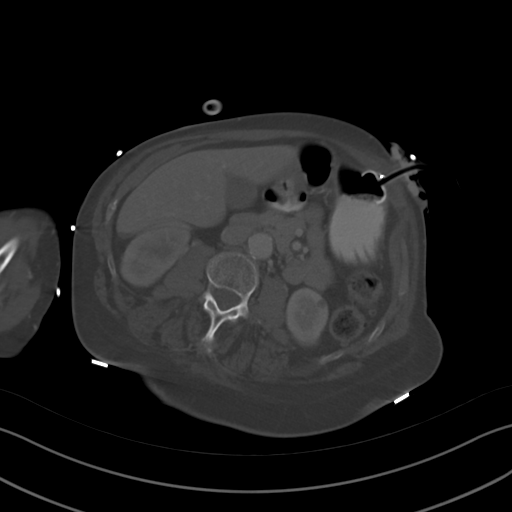
[im 67/89  soft-tissue]
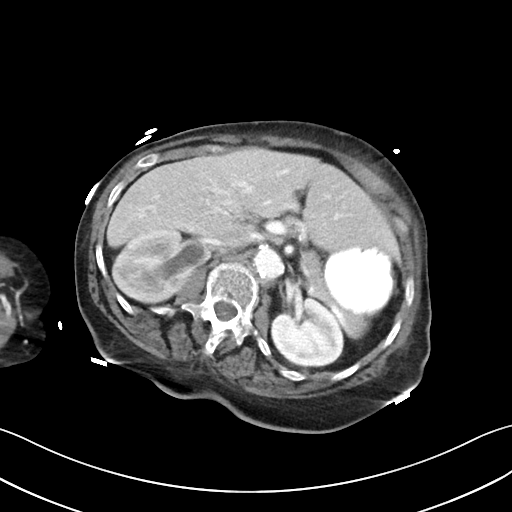
[im 78/89  soft-tissue]
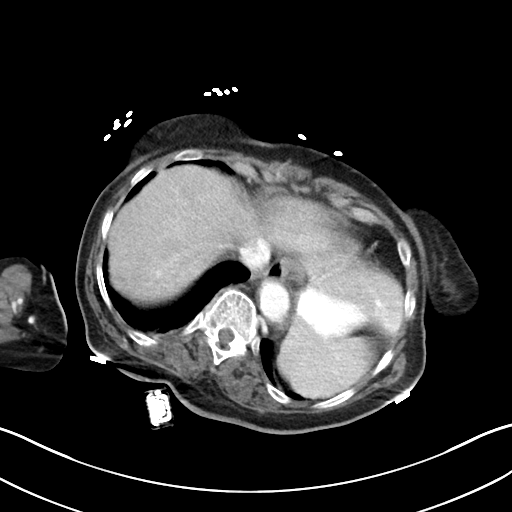
[im 83/89  soft-tissue]
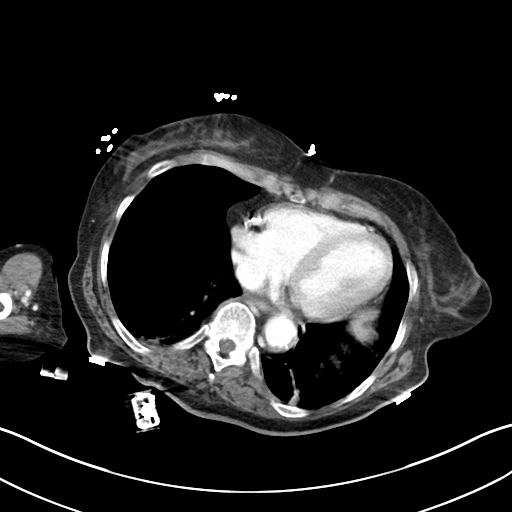

[Series 6: abdomen 3.0 mpr cor · coronal · 0.68mm/px · 3 of 96 slices shown]
[im 32/96  soft-tissue]
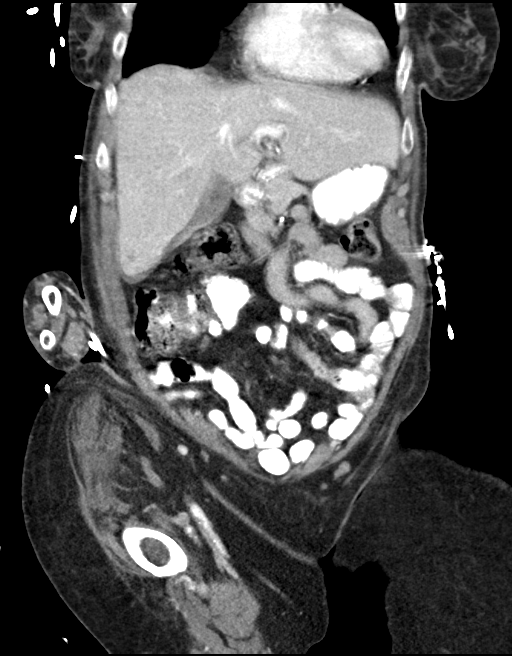
[im 43/96  soft-tissue]
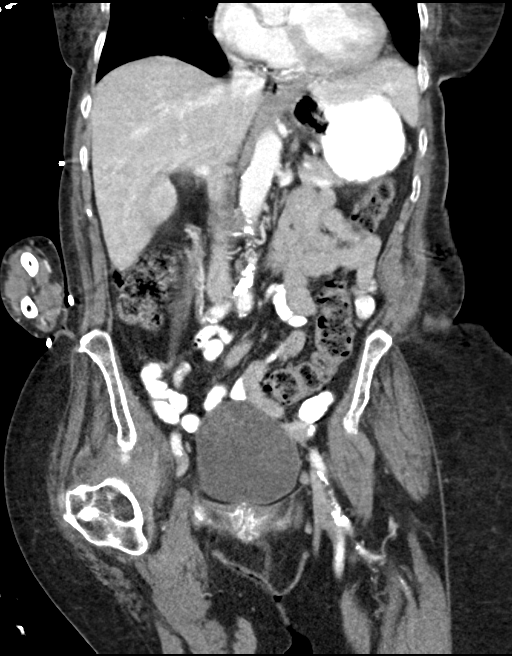
[im 53/96  soft-tissue]
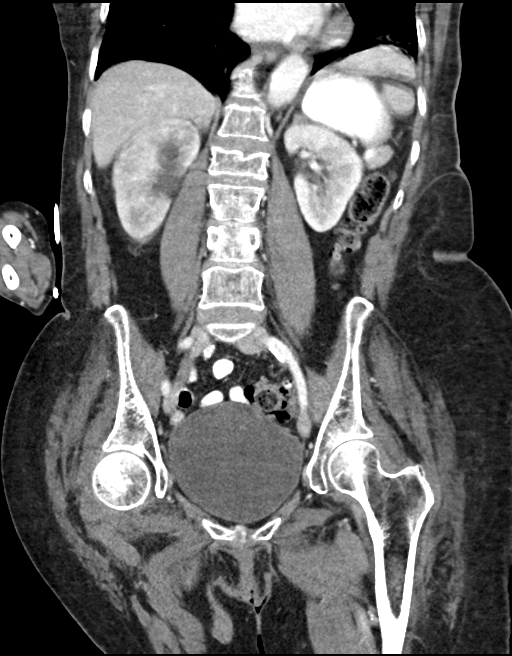

[15 of 46 positions shown; findings below may reference images not displayed]

FINDINGS: Lower chest: Confluent bilateral lower lobe atelectasis. Left lower
lobe consolidation has largely resolved. Mild cardiomegaly. No
pericardial or pleural effusion. Soft and calcified aortic
atherosclerosis. Calcified coronary artery atherosclerosis and/or
stent.

Hepatobiliary: Negative liver and gallbladder.

Pancreas: Negative.

Spleen: Negative.

Adrenals/Urinary Tract: Normal adrenal glands.

Right side delayed nephrogram and hydronephrosis, although the right
UPJ is indistinct and the obstructing etiology is unclear. There is
no right hydroureter. Mild respiratory motion at the right kidney.
No renal abscess or perinephric fluid.

Contralateral left renal enhancement and contrast excretion appears
within normal limits. Proximal left ureter is decompressed. Mildly
distended but otherwise unremarkable urinary bladder. Numerous
pelvic phleboliths.

Stomach/Bowel: Incontinence of stool at the rectum which otherwise
appears negative. Extensive diverticulosis of the large bowel from
the hepatic flexure to the sigmoid. No active inflammation
identified. Mildly increased retained stool.

Oral contrast has reached the cecum. The cecum is in the midline on
a lax mesentery. Normal appendix on coronal image 31. No dilated or
abnormal small bowel.

Peg tube in place in the left upper quadrant on series 3, image 29.
There is regional mild rectus muscle thickening and subcutaneous
stranding (image 25). Negative stomach. No other adverse features.

No free air, free fluid.

Vascular/Lymphatic: Aortoiliac calcified atherosclerosis. Major
arterial structures are patent. Portal venous system is patent.

No lymphadenopathy.

Reproductive: Smooth hyperdense right labial cyst or mass appears
stable and is probably incidental (series 3, image 78). Otherwise
negative.

Other: No pelvic free fluid.

Musculoskeletal: Stable mild L1 compression fracture. Osteopenia.

Gas containing sacral decubitus wound with progression since
02/03/2019. This overlies the S4 level and the S3 through S5 sacral
segments now appear sclerotic and somewhat indistinct. Coccygeal
segments are stable. No associated fluid collection.

No other acute osseous abnormality identified.
IMPRESSION: 1. Gas containing sacral decubitus wound with progression since
02/02/17, and the S3 through S5 sacral segments have become
sclerotic and indistinct compatible with active sacral
OSTEOMYELITIS.
2. Mild soft tissue inflammation about the PEG tube compatible with
Cellulitis, but satisfactory PEG position and no other adverse
features.
3. Obstructive Uropathy on the Right at the level of the UPJ, but
the obstructing etiology is unclear. If renal function is threatened
consider percutaneous decompression of the kidney.
4. No other acute or inflammatory process identified in the abdomen
or pelvis.
5. Improved lung base ventilation with residual atelectasis.
6.  Aortic Atherosclerosis ([id number].S).
7. Extensive large bowel diverticulosis.

## 2019-07-01 IMAGING — DX PORTABLE ABDOMEN - 1 VIEW
1 series · 2 of 2 positions shown · non-contrast
Comparison: One-view abdomen 02/22/2019

CLINICAL DATA: Ileus.

EXAM:
PORTABLE ABDOMEN - 1 VIEW

[Series 1: abdomen kub · 0.14mm/px · 2 of 2 slices shown]
[im 1/2]
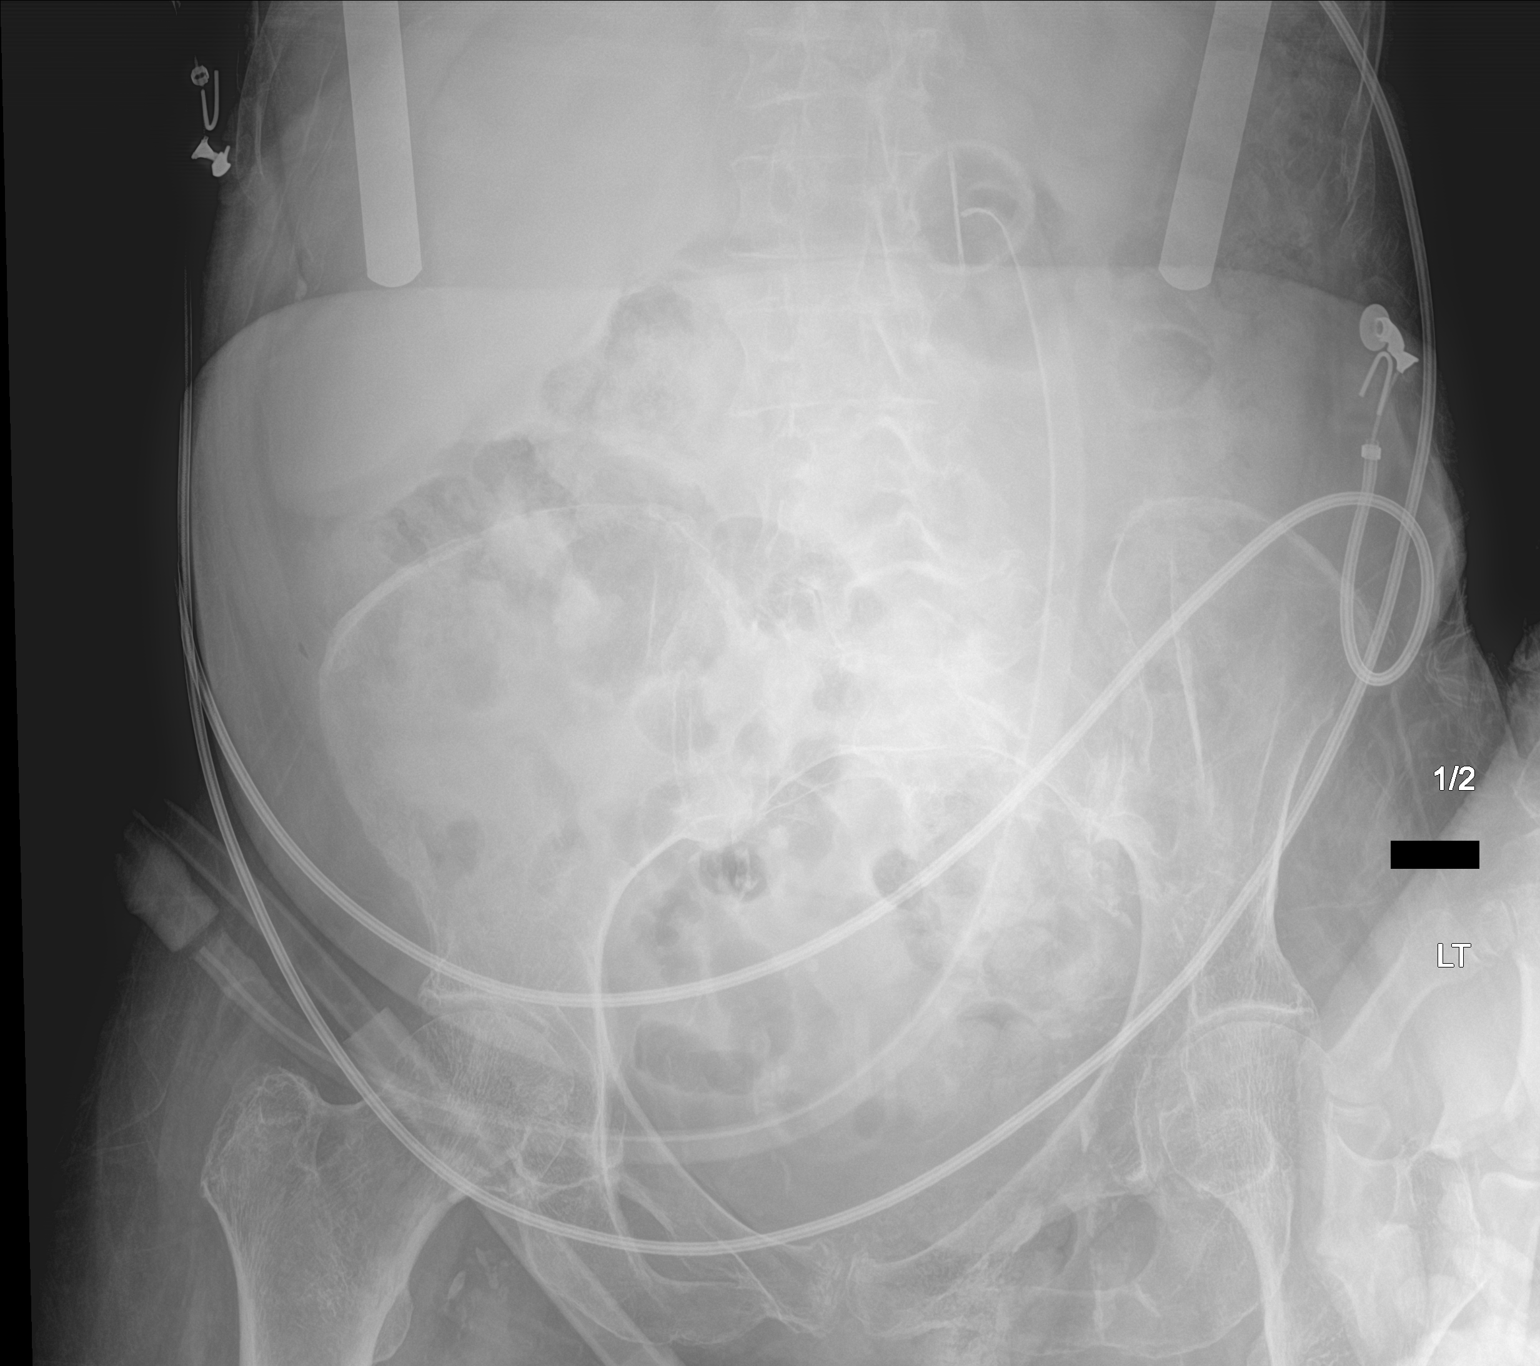
[im 2/2]
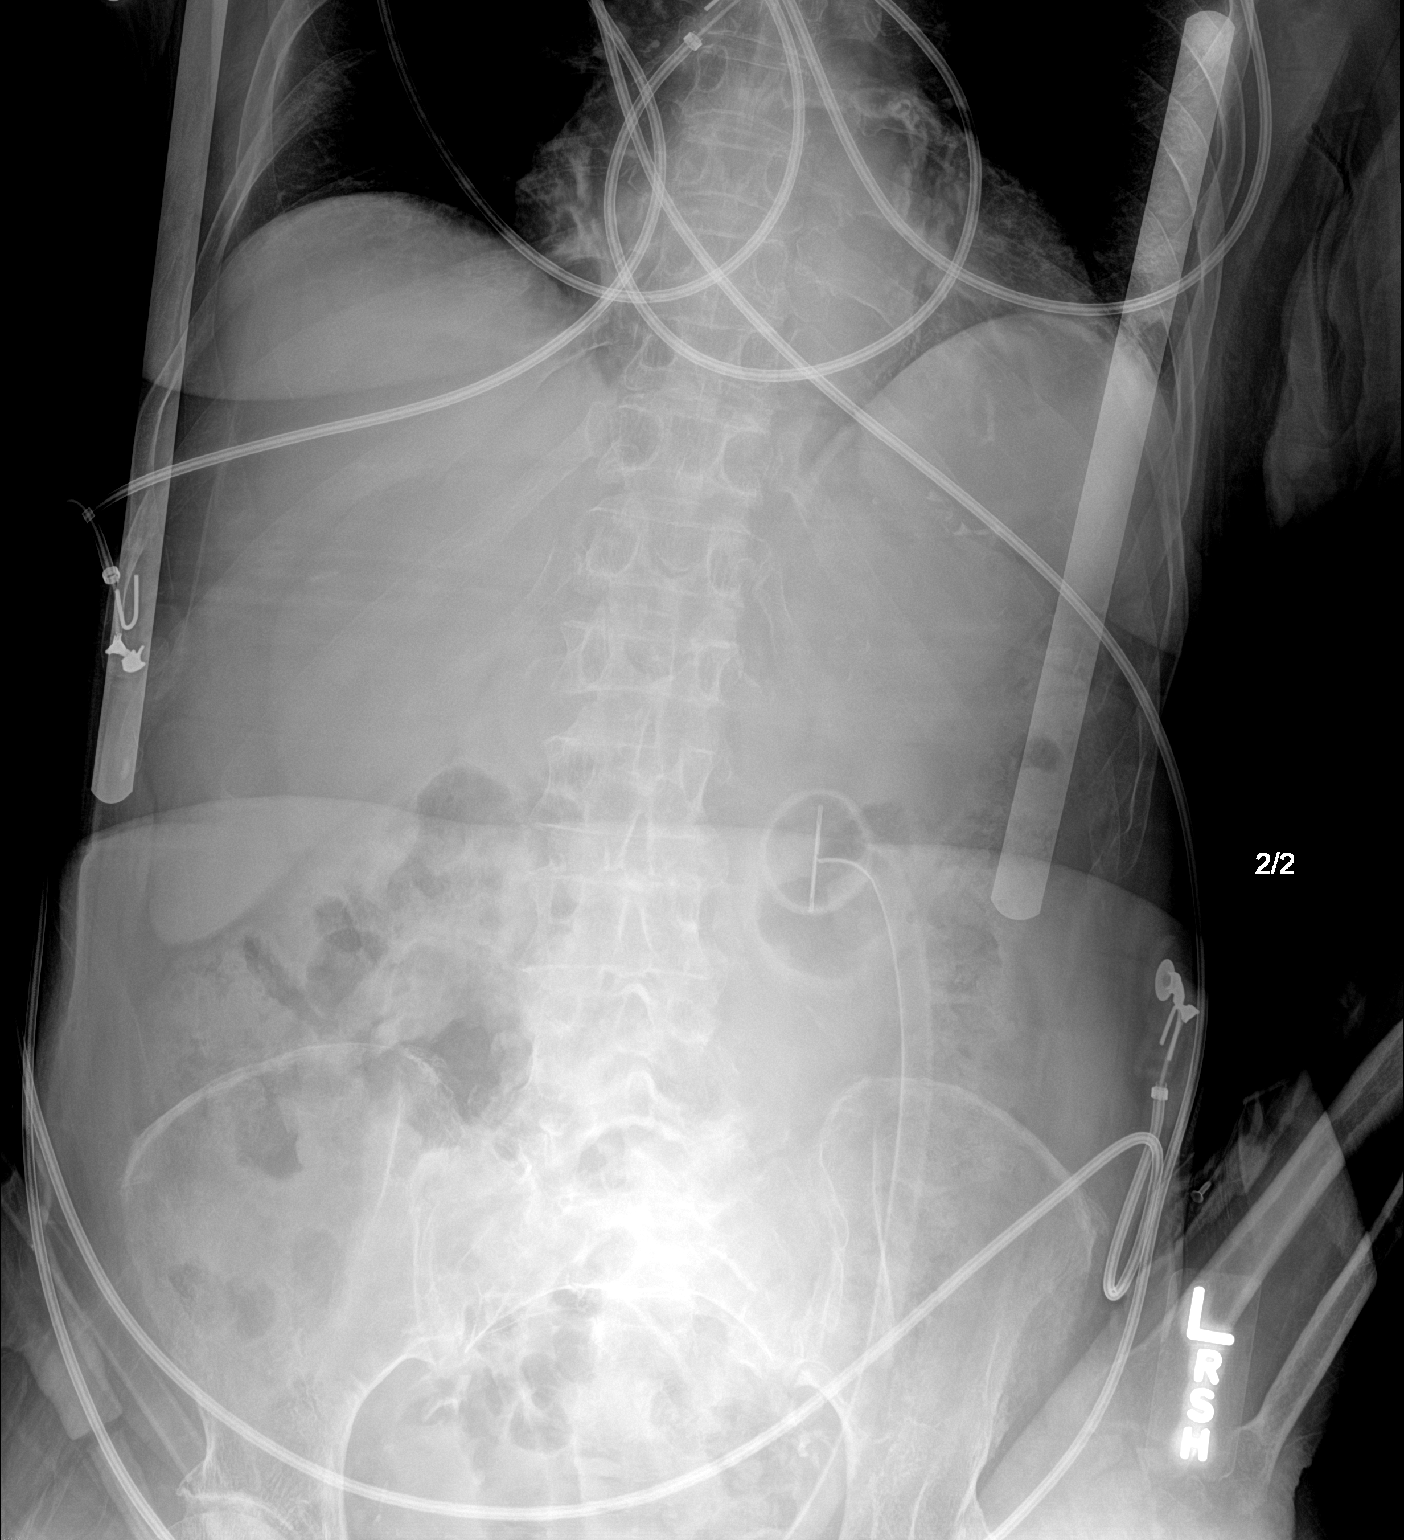

[2 of 2 positions shown; findings below may reference images not displayed]

FINDINGS: Peg tube is in place. Bowel gas pattern is unremarkable. There is no
obstruction or free air. Vascular calcifications are noted.
Degenerative changes are present in the hips.
IMPRESSION: 1. Normal bowel gas pattern.  No obstruction or ileus.
